# Patient Record
Sex: Female | Born: 1975 | Race: Black or African American | Hispanic: No | Marital: Single | Smoking: Current every day smoker
Health system: Southern US, Community
[De-identification: ages and names within clinical notes are randomized; demographics above are authoritative.]

## PROBLEM LIST (undated history)

## (undated) ENCOUNTER — Emergency Department (HOSPITAL_COMMUNITY): Admission: EM | Payer: MEDICAID | Source: Home / Self Care

## (undated) DIAGNOSIS — S42309A Unspecified fracture of shaft of humerus, unspecified arm, initial encounter for closed fracture: Secondary | ICD-10-CM

## (undated) DIAGNOSIS — F489 Nonpsychotic mental disorder, unspecified: Secondary | ICD-10-CM

## (undated) DIAGNOSIS — Z8709 Personal history of other diseases of the respiratory system: Secondary | ICD-10-CM

## (undated) DIAGNOSIS — E119 Type 2 diabetes mellitus without complications: Secondary | ICD-10-CM

## (undated) DIAGNOSIS — J45909 Unspecified asthma, uncomplicated: Secondary | ICD-10-CM

## (undated) DIAGNOSIS — S92402B Displaced unspecified fracture of left great toe, initial encounter for open fracture: Secondary | ICD-10-CM

## (undated) HISTORY — PX: LIPOMA EXCISION: SHX5283

---
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
## 1999-03-02 ENCOUNTER — Inpatient Hospital Stay (HOSPITAL_COMMUNITY): Admission: EM | Admit: 1999-03-02 | Discharge: 1999-03-03 | Payer: Self-pay | Admitting: Emergency Medicine

## 1999-03-02 ENCOUNTER — Encounter: Payer: Self-pay | Admitting: Infectious Diseases

## 1999-11-13 ENCOUNTER — Ambulatory Visit (HOSPITAL_COMMUNITY): Admission: RE | Admit: 1999-11-13 | Discharge: 1999-11-13 | Payer: Self-pay | Admitting: *Deleted

## 1999-12-14 ENCOUNTER — Ambulatory Visit (HOSPITAL_COMMUNITY): Admission: RE | Admit: 1999-12-14 | Discharge: 1999-12-14 | Payer: Self-pay | Admitting: *Deleted

## 1999-12-25 ENCOUNTER — Inpatient Hospital Stay (HOSPITAL_COMMUNITY): Admission: AD | Admit: 1999-12-25 | Discharge: 1999-12-25 | Payer: Self-pay | Admitting: *Deleted

## 1999-12-25 ENCOUNTER — Encounter: Payer: Self-pay | Admitting: *Deleted

## 2000-01-12 ENCOUNTER — Inpatient Hospital Stay (HOSPITAL_COMMUNITY): Admission: AD | Admit: 2000-01-12 | Discharge: 2000-01-15 | Payer: Self-pay | Admitting: *Deleted

## 2000-01-12 ENCOUNTER — Encounter: Payer: Self-pay | Admitting: *Deleted

## 2001-06-21 HISTORY — PX: TUBAL LIGATION: SHX77

## 2001-06-21 HISTORY — PX: ECTOPIC PREGNANCY SURGERY: SHX613

## 2002-04-29 ENCOUNTER — Encounter: Payer: Self-pay | Admitting: Emergency Medicine

## 2002-04-29 ENCOUNTER — Emergency Department (HOSPITAL_COMMUNITY): Admission: EM | Admit: 2002-04-29 | Discharge: 2002-04-29 | Payer: Self-pay | Admitting: Emergency Medicine

## 2002-05-10 ENCOUNTER — Emergency Department (HOSPITAL_COMMUNITY): Admission: EM | Admit: 2002-05-10 | Discharge: 2002-05-10 | Payer: Self-pay | Admitting: Emergency Medicine

## 2002-07-05 ENCOUNTER — Observation Stay (HOSPITAL_COMMUNITY): Admission: AD | Admit: 2002-07-05 | Discharge: 2002-07-06 | Payer: Self-pay | Admitting: *Deleted

## 2010-11-30 ENCOUNTER — Emergency Department (HOSPITAL_COMMUNITY)
Admission: EM | Admit: 2010-11-30 | Discharge: 2010-12-01 | Disposition: A | Discharge status: Home / Self Care | Payer: Self-pay | Attending: Emergency Medicine | Admitting: Emergency Medicine

## 2010-11-30 DIAGNOSIS — X58XXXA Exposure to other specified factors, initial encounter: Secondary | ICD-10-CM | POA: Insufficient documentation

## 2010-11-30 DIAGNOSIS — T148XXA Other injury of unspecified body region, initial encounter: Secondary | ICD-10-CM | POA: Insufficient documentation

## 2010-11-30 DIAGNOSIS — M545 Low back pain, unspecified: Secondary | ICD-10-CM | POA: Insufficient documentation

## 2010-11-30 DIAGNOSIS — M25519 Pain in unspecified shoulder: Secondary | ICD-10-CM | POA: Insufficient documentation

## 2010-11-30 DIAGNOSIS — M546 Pain in thoracic spine: Secondary | ICD-10-CM | POA: Insufficient documentation

## 2010-11-30 LAB — URINALYSIS, ROUTINE W REFLEX MICROSCOPIC
Glucose, UA: NEGATIVE mg/dL
Hgb urine dipstick: NEGATIVE
Ketones, ur: 15 mg/dL — AB
Nitrite: NEGATIVE
Protein, ur: NEGATIVE mg/dL
Specific Gravity, Urine: 1.029 (ref 1.005–1.030)
Urobilinogen, UA: 1 mg/dL (ref 0.0–1.0)
pH: 5.5 (ref 5.0–8.0)

## 2010-11-30 LAB — URINE MICROSCOPIC-ADD ON

## 2011-03-03 ENCOUNTER — Emergency Department (HOSPITAL_COMMUNITY): Payer: Self-pay

## 2011-03-03 ENCOUNTER — Emergency Department (HOSPITAL_COMMUNITY)
Admission: EM | Admit: 2011-03-03 | Discharge: 2011-03-03 | Disposition: A | Discharge status: Home / Self Care | Payer: Self-pay | Attending: Emergency Medicine | Admitting: Emergency Medicine

## 2011-03-03 DIAGNOSIS — J029 Acute pharyngitis, unspecified: Secondary | ICD-10-CM | POA: Insufficient documentation

## 2011-03-03 DIAGNOSIS — J45909 Unspecified asthma, uncomplicated: Secondary | ICD-10-CM | POA: Insufficient documentation

## 2011-03-03 DIAGNOSIS — R0602 Shortness of breath: Secondary | ICD-10-CM | POA: Insufficient documentation

## 2011-03-25 ENCOUNTER — Inpatient Hospital Stay (HOSPITAL_COMMUNITY)
Admission: AD | Admit: 2011-03-25 | Discharge: 2011-03-25 | Discharge status: Against Medical Advice | Payer: Self-pay | Source: Ambulatory Visit | Attending: Obstetrics & Gynecology | Admitting: Obstetrics & Gynecology

## 2011-03-25 ENCOUNTER — Emergency Department (HOSPITAL_COMMUNITY)
Admission: EM | Admit: 2011-03-25 | Discharge: 2011-03-25 | Disposition: A | Discharge status: Home / Self Care | Payer: Self-pay | Attending: Emergency Medicine | Admitting: Emergency Medicine

## 2011-03-25 DIAGNOSIS — L02219 Cutaneous abscess of trunk, unspecified: Secondary | ICD-10-CM | POA: Insufficient documentation

## 2011-03-25 DIAGNOSIS — R109 Unspecified abdominal pain: Secondary | ICD-10-CM | POA: Insufficient documentation

## 2011-03-25 DIAGNOSIS — L03319 Cellulitis of trunk, unspecified: Secondary | ICD-10-CM | POA: Insufficient documentation

## 2011-03-25 NOTE — Plan of Care (Signed)
Pt is not in the lobby when called to triage 

## 2011-03-27 ENCOUNTER — Inpatient Hospital Stay (INDEPENDENT_AMBULATORY_CARE_PROVIDER_SITE_OTHER)
Admission: RE | Admit: 2011-03-27 | Discharge: 2011-03-27 | Disposition: A | Discharge status: Home / Self Care | Payer: Self-pay | Source: Ambulatory Visit | Attending: Family Medicine | Admitting: Family Medicine

## 2011-03-27 DIAGNOSIS — L03319 Cellulitis of trunk, unspecified: Secondary | ICD-10-CM

## 2011-04-01 ENCOUNTER — Inpatient Hospital Stay (INDEPENDENT_AMBULATORY_CARE_PROVIDER_SITE_OTHER)
Admission: RE | Admit: 2011-04-01 | Discharge: 2011-04-01 | Disposition: A | Discharge status: Home / Self Care | Payer: Self-pay | Source: Ambulatory Visit | Attending: Emergency Medicine | Admitting: Emergency Medicine

## 2011-04-01 DIAGNOSIS — L0291 Cutaneous abscess, unspecified: Secondary | ICD-10-CM

## 2011-08-12 ENCOUNTER — Encounter (HOSPITAL_COMMUNITY): Payer: Self-pay | Admitting: Emergency Medicine

## 2011-08-12 ENCOUNTER — Emergency Department (INDEPENDENT_AMBULATORY_CARE_PROVIDER_SITE_OTHER)
Admission: EM | Admit: 2011-08-12 | Discharge: 2011-08-12 | Disposition: A | Discharge status: Home Health | Payer: Self-pay | Source: Home / Self Care | Attending: Emergency Medicine | Admitting: Emergency Medicine

## 2011-08-12 DIAGNOSIS — K047 Periapical abscess without sinus: Secondary | ICD-10-CM

## 2011-08-12 MED ORDER — PENICILLIN V POTASSIUM 500 MG PO TABS: 500.0000 mg | ORAL_TABLET | Freq: Three times a day (TID) | ORAL | Status: AC

## 2011-08-12 MED ORDER — HYDROCODONE-ACETAMINOPHEN 5-500 MG PO TABS: 1.0000 | ORAL_TABLET | Freq: Four times a day (QID) | ORAL | Status: AC | PRN

## 2011-08-12 NOTE — ED Notes (Signed)
Multiple complaints. Pain under left jaw radiating to left ear.  Onset 2-3 days ago.  Visibly swollen face.  Patient denies tooth pain.   Reports stumping right great toe and great toe nail separated from toe.

## 2011-08-12 NOTE — Discharge Instructions (Signed)
Abscessed Tooth °A tooth abscess is a collection of infected fluid (pus) from a bacterial infection in the inner part of the tooth (pulp). It usually occurs at the end of the tooth's root.  °CAUSES  °· A very bad cavity (extensive tooth decay).  °· Trauma to the tooth, such as a broken or chipped tooth, that allows bacteria to enter into the pulp.  °SYMPTOMS °· Severe pain in and around the infected tooth.  °· Swelling and redness around the abscessed tooth or in the mouth or face.  °· Tenderness.  °· Pus drainage.  °· Bad breath.  °· Bitter taste in the mouth.  °· Difficulty swallowing.  °· Difficulty opening the mouth.  °· Feeling sick to your stomach (nauseous).  °· Vomiting.  °· Chills.  °· Swollen neck glands.  °DIAGNOSIS °· A medical and dental history will be taken.  °· An examination will be performed by tapping on the abscessed tooth.  °· X-rays may be taken of the tooth to identify the abscess.  °TREATMENT °The goal of treatment is to eliminate the infection.  °· You may be prescribed antibiotic medicine to stop the infection from spreading.  °· A root canal may be performed to save the tooth. If the tooth cannot be saved, it may be pulled (extracted) and the abscess may be drained.  °HOME CARE INSTRUCTIONS °· Only take over-the-counter or prescription medicines for pain, fever, or discomfort as directed by your caregiver.  °· Do not drive after taking pain medicine (narcotics).  °· Rinse your mouth (gargle) often with salt water (¼ tsp salt in 8 oz of warm water) to relieve pain or swelling.  °· Do not apply heat to the outside of your face.  °· Return to your dentist for further treatment as directed.  °SEEK IMMEDIATE DENTAL CARE IF: °· You have a temperature by mouth above 102° F (38.9° C), not controlled by medicine.  °· You have chills or a very bad headache.  °· You have problems breathing or swallowing.  °· Your have trouble opening your mouth.  °· You develop swelling in the neck or around the eye.   °· Your pain is not helped by medicine.  °· Your pain is getting worse instead of better.  °Document Released: 06/07/2005 Document Revised: 02/17/2011 Document Reviewed: 09/15/2010 °Exit[employer]® Patient Information ©2012 [employer]. °

## 2011-08-12 NOTE — ED Provider Notes (Signed)
History     CSN: [id number]  Arrival date & time 08/12/11  1729   First MD Initiated Contact with Patient 08/12/11 1816      Chief Complaint  Patient presents with  . Abscess    (Consider location/radiation/quality/duration/timing/severity/associated sxs/prior treatment) HPI Comments: Patient presented to urgent care complaining of left jaw swelling with associated with dental pain. She has had a feeling of a molar on her left lower teeth. Has been perceiving changes to temperature and  Intermittent discomfort that come and go. This morning noticed her face to be swollen and hurting on the left lower jaw pain shoots up to her ear. No fevers, no headaches.  Patient is a 36 y.o. female presenting with abscess. The history is provided by the patient.  Abscess  This is a new problem. The current episode started less than one week ago. The onset was sudden. The problem occurs rarely. The problem has been unchanged. The problem is moderate. The patient was exposed to OTC medications. The abscess first occurred at home. Pertinent negatives include no fever, no congestion and no rhinorrhea.    Past Medical History  Diagnosis Date  . Asthma   . Bronchitis     Past Surgical History  Procedure Date  . Cesarean section   . Axillary surgery     for abscess  . Vagina surgery     abscess    History reviewed. No pertinent family history.  History  Substance Use Topics  . Smoking status: Current Everyday Smoker  . Smokeless tobacco: Not on file  . Alcohol Use: No    OB History    Grav Para Term Preterm Abortions TAB SAB Ect Mult Living                  Review of Systems  Constitutional: Negative for fever, chills and fatigue.  HENT: Positive for ear pain. Negative for congestion, rhinorrhea, drooling, mouth sores, neck pain, neck stiffness and postnasal drip.     Allergies  Review of patient's allergies indicates no known allergies.  Home Medications   Current Outpatient  Rx  Name Route Sig Dispense Refill  . ALBUTEROL IN Inhalation Inhale into the lungs.    Marland Kitchen HYDROCODONE-ACETAMINOPHEN 5-500 MG PO TABS Oral Take 1 tablet by mouth every 6 (six) hours as needed for pain. 15 tablet 0  . PENICILLIN V POTASSIUM 500 MG PO TABS Oral Take 1 tablet (500 mg total) by mouth 3 (three) times daily. 30 tablet 0    BP 126/80  Pulse 80  Temp(Src) 98.9 F (37.2 C) (Oral)  Resp 16  SpO2 97%  LMP 08/01/2011  Physical Exam  Nursing note and vitals reviewed. Constitutional: She appears well-developed and well-nourished.  HENT:  Head: Normocephalic.  Mouth/Throat:    Neck: Normal range of motion. No JVD present.  Pulmonary/Chest: Effort normal.  Abdominal: Soft.  Skin: Skin is warm.    ED Course  Procedures (including critical care time)  Labs Reviewed - No data to display No results found.   1. Dental abscess       MDM  Patient with a molar lower left-sided early abscess formation. Afebrile in sudden onset of facial swelling for the last 12 hours.        Jimmie Molly, MD 08/12/11 2027

## 2011-10-05 ENCOUNTER — Encounter (HOSPITAL_COMMUNITY): Payer: Self-pay | Admitting: *Deleted

## 2011-10-05 ENCOUNTER — Emergency Department (HOSPITAL_COMMUNITY)
Admission: EM | Admit: 2011-10-05 | Discharge: 2011-10-05 | Disposition: A | Discharge status: Home / Self Care | Payer: Self-pay | Attending: Emergency Medicine | Admitting: Emergency Medicine

## 2011-10-05 ENCOUNTER — Emergency Department (HOSPITAL_COMMUNITY): Payer: Self-pay

## 2011-10-05 DIAGNOSIS — J45909 Unspecified asthma, uncomplicated: Secondary | ICD-10-CM | POA: Insufficient documentation

## 2011-10-05 DIAGNOSIS — L0390 Cellulitis, unspecified: Secondary | ICD-10-CM

## 2011-10-05 DIAGNOSIS — R1031 Right lower quadrant pain: Secondary | ICD-10-CM | POA: Insufficient documentation

## 2011-10-05 DIAGNOSIS — J189 Pneumonia, unspecified organism: Secondary | ICD-10-CM | POA: Insufficient documentation

## 2011-10-05 DIAGNOSIS — L02219 Cutaneous abscess of trunk, unspecified: Secondary | ICD-10-CM | POA: Insufficient documentation

## 2011-10-05 DIAGNOSIS — R059 Cough, unspecified: Secondary | ICD-10-CM | POA: Insufficient documentation

## 2011-10-05 DIAGNOSIS — R05 Cough: Secondary | ICD-10-CM | POA: Insufficient documentation

## 2011-10-05 DIAGNOSIS — L03319 Cellulitis of trunk, unspecified: Secondary | ICD-10-CM | POA: Insufficient documentation

## 2011-10-05 LAB — CBC
HCT: 35.8 % — ABNORMAL LOW (ref 36.0–46.0)
Hemoglobin: 11.5 g/dL — ABNORMAL LOW (ref 12.0–15.0)
MCH: 24.7 pg — ABNORMAL LOW (ref 26.0–34.0)
MCHC: 32.1 g/dL (ref 30.0–36.0)
MCV: 76.8 fL — ABNORMAL LOW (ref 78.0–100.0)
Platelets: 210 10*3/uL (ref 150–400)
RBC: 4.66 MIL/uL (ref 3.87–5.11)
RDW: 15.7 % — ABNORMAL HIGH (ref 11.5–15.5)
WBC: 9.8 10*3/uL (ref 4.0–10.5)

## 2011-10-05 LAB — DIFFERENTIAL
Basophils Absolute: 0 10*3/uL (ref 0.0–0.1)
Basophils Relative: 0 % (ref 0–1)
Eosinophils Absolute: 0.1 10*3/uL (ref 0.0–0.7)
Eosinophils Relative: 1 % (ref 0–5)
Lymphocytes Relative: 16 % (ref 12–46)
Lymphs Abs: 1.6 10*3/uL (ref 0.7–4.0)
Monocytes Absolute: 0.7 10*3/uL (ref 0.1–1.0)
Monocytes Relative: 7 % (ref 3–12)
Neutro Abs: 7.4 10*3/uL (ref 1.7–7.7)
Neutrophils Relative %: 76 % (ref 43–77)

## 2011-10-05 LAB — URINALYSIS, ROUTINE W REFLEX MICROSCOPIC
Bilirubin Urine: NEGATIVE
Glucose, UA: NEGATIVE mg/dL
Leukocytes, UA: NEGATIVE
Nitrite: NEGATIVE
Protein, ur: 30 mg/dL — AB
Specific Gravity, Urine: 1.028 (ref 1.005–1.030)
Urobilinogen, UA: 1 mg/dL (ref 0.0–1.0)
pH: 6 (ref 5.0–8.0)

## 2011-10-05 LAB — BASIC METABOLIC PANEL
BUN: 8 mg/dL (ref 6–23)
CO2: 23 mEq/L (ref 19–32)
Calcium: 9.6 mg/dL (ref 8.4–10.5)
Chloride: 104 mEq/L (ref 96–112)
Creatinine, Ser: 0.62 mg/dL (ref 0.50–1.10)
GFR calc Af Amer: >90 mL/min (ref >90)
GFR calc non Af Amer: >90 mL/min (ref >90)
Glucose, Bld: 91 mg/dL (ref 70–99)
Potassium: 3.7 mEq/L (ref 3.5–5.1)
Sodium: 138 mEq/L (ref 135–145)

## 2011-10-05 LAB — URINE MICROSCOPIC-ADD ON

## 2011-10-05 LAB — PREGNANCY, URINE: Preg Test, Ur: NEGATIVE

## 2011-10-05 MED ORDER — IBUPROFEN 800 MG PO TABS
800.0000 mg | ORAL_TABLET | Freq: Once | ORAL | Status: AC
  Administered 2011-10-05: 800 mg via ORAL

## 2011-10-05 MED ORDER — DOXYCYCLINE HYCLATE 100 MG PO CAPS: 100.0000 mg | ORAL_CAPSULE | Freq: Two times a day (BID) | ORAL | Status: AC

## 2011-10-05 MED ORDER — ALBUTEROL SULFATE HFA 108 (90 BASE) MCG/ACT IN AERS
INHALATION_SPRAY | RESPIRATORY_TRACT | Status: AC
  Filled 2011-10-05: qty 6.7

## 2011-10-05 MED ORDER — HYDROCODONE-ACETAMINOPHEN 5-325 MG PO TABS: 1.0000 | ORAL_TABLET | Freq: Four times a day (QID) | ORAL | Status: AC | PRN

## 2011-10-05 MED ORDER — GUAIFENESIN ER 1200 MG PO TB12: 1.0000 | ORAL_TABLET | Freq: Two times a day (BID) | ORAL | Status: DC

## 2011-10-05 MED ORDER — ALBUTEROL SULFATE HFA 108 (90 BASE) MCG/ACT IN AERS
2.0000 | INHALATION_SPRAY | RESPIRATORY_TRACT | Status: DC | PRN
  Administered 2011-10-05: 2 via RESPIRATORY_TRACT

## 2011-10-05 MED ORDER — IBUPROFEN 800 MG PO TABS
ORAL_TABLET | ORAL | Status: AC
  Filled 2011-10-05: qty 1

## 2011-10-05 NOTE — ED Notes (Signed)
Pt. Discharged to home via w/c, pt. Alert and oriented, NAD noted 

## 2011-10-05 NOTE — Discharge Instructions (Signed)
Use warm compresses on the area on her right lower abdomen.  Return here see if any worsening in her condition.  Followup here in 2 days for recheck of the area on her lower abdomen.

## 2011-10-05 NOTE — ED Notes (Signed)
Pt states she has been treated for an abscess before. Pt states they cont to come back under her abdominal foils. Pt also c/o back pain. Pt state he cycle has came back on with in 15 days

## 2011-10-05 NOTE — Progress Notes (Signed)
Pt listed as self pay with no insurance coverage Pt confirms she is self pay [place] resident.  CM and [facility] community liaison spoke with her Pt offered [facility] services to assist with finding a [place] self pay provider Pt states she is presently living with a friend and will not be able to offer information for [facility] application ED CM iscussed the importance of a pcp for f/u. Reviewed Health connect number to assist with finding self pay provider close to her residence. Reviewed resources for medications, housing, DSS, health Department and other resources in [place] Pt voiced understanding and appreciation of resources provided

## 2011-10-05 NOTE — ED Notes (Signed)
Pt states she is unable to afford meds.  Talked with MD.  Will consult with case management for prescription refill

## 2011-10-05 NOTE — ED Provider Notes (Signed)
History     CSN: [id number]  Arrival date & time 10/05/11  1301   First MD Initiated Contact with Patient 10/05/11 1348      Chief Complaint  Patient presents with  . Abscess  . Back Pain  . Nasal Congestion    (Consider location/radiation/quality/duration/timing/severity/associated sxs/prior treatment) HPI Patient presents to the emergency department with cough and upper respiratory symptoms, along with what she feels it may be a forming abscess under her abdominal fat fold.  Patient states that she has not had any fevers, chest pain, shortness of breath, weakness, nausea/vomiting, abdominal pain, or headache.  Patient states she has had a previous abscess in the same area, where she is complaining of discomfort. Past Medical History  Diagnosis Date  . Asthma   . Bronchitis     Past Surgical History  Procedure Date  . Cesarean section   . Axillary surgery     for abscess  . Vagina surgery     abscess    No family history on file.  History  Substance Use Topics  . Smoking status: Current Everyday Smoker  . Smokeless tobacco: Not on file  . Alcohol Use: No    OB History    Grav Para Term Preterm Abortions TAB SAB Ect Mult Living                  Review of Systems All pertinent positives and negatives reviewed in the history of present illness  Allergies  Review of patient's allergies indicates no known allergies.  Home Medications   Current Outpatient Rx  Name Route Sig Dispense Refill  . ALBUTEROL SULFATE HFA 108 (90 BASE) MCG/ACT IN AERS Inhalation Inhale 2 puffs into the lungs every 6 (six) hours as needed. Wheezing      BP 122/75  Pulse 125  Temp(Src) 98.8 F (37.1 C) (Oral)  Resp 18  SpO2 95%  LMP 10/05/2011  Physical Exam  Constitutional: She is oriented to person, place, and time. She appears well-developed and well-nourished. No distress.  HENT:  Head: Normocephalic and atraumatic.  Cardiovascular: Regular rhythm and normal heart  sounds.  Exam reveals no gallop and no friction rub.   No murmur heard. Pulmonary/Chest: Effort normal and breath sounds normal.  Abdominal: Normal appearance and bowel sounds are normal. There is no tenderness.    Neurological: She is alert and oriented to person, place, and time.  Skin: Skin is warm and dry. No rash noted.    ED Course  Procedures (including critical care time) Patient has a questionable pneumonia on her chest x-ray.  The patient started on antibiotics.  I will have her recheck with her primary doctor for the small red area underneath her right side of her pannus.  Told to use warm compresses on the area as well.   MDM  MDM Reviewed: nursing note and vitals Interpretation: labs and x-ray            Carlyle Dolly, PA-C 10/05/11 1723

## 2011-10-05 NOTE — Progress Notes (Signed)
Pt self pay ED CM contacted by [facility] ED RN after pt reports she is unable to afford medication cost at time of d/c Sharyl Nimrod at [facility] pharmacy confirmed pt is eligible for [facility] indigent services CM reviewed EPIC to determine eligibility also.  Rx processed for doxycycline and guaifenesin and tubed to [facility] pharmacy with request to contact ED RN when ready for pt

## 2011-10-06 NOTE — ED Provider Notes (Signed)
Medical screening examination/treatment/procedure(s) were performed by non-physician practitioner and as supervising physician I was immediately available for consultation/collaboration.  Raeford Razor, MD 10/06/11 [phone]

## 2011-12-17 ENCOUNTER — Encounter (HOSPITAL_COMMUNITY): Payer: Self-pay | Admitting: Emergency Medicine

## 2011-12-17 ENCOUNTER — Emergency Department (HOSPITAL_COMMUNITY)
Admission: EM | Admit: 2011-12-17 | Discharge: 2011-12-18 | Disposition: A | Discharge status: Home / Self Care | Payer: Self-pay | Attending: Emergency Medicine | Admitting: Emergency Medicine

## 2011-12-17 DIAGNOSIS — J45909 Unspecified asthma, uncomplicated: Secondary | ICD-10-CM | POA: Insufficient documentation

## 2011-12-17 DIAGNOSIS — L03319 Cellulitis of trunk, unspecified: Secondary | ICD-10-CM | POA: Insufficient documentation

## 2011-12-17 DIAGNOSIS — L0291 Cutaneous abscess, unspecified: Secondary | ICD-10-CM

## 2011-12-17 DIAGNOSIS — F17200 Nicotine dependence, unspecified, uncomplicated: Secondary | ICD-10-CM | POA: Insufficient documentation

## 2011-12-17 DIAGNOSIS — L02219 Cutaneous abscess of trunk, unspecified: Secondary | ICD-10-CM | POA: Insufficient documentation

## 2011-12-17 MED ORDER — OXYCODONE-ACETAMINOPHEN 5-325 MG PO TABS
2.0000 | ORAL_TABLET | Freq: Once | ORAL | Status: AC
  Administered 2011-12-18: 2 via ORAL
  Filled 2011-12-17: qty 2

## 2011-12-17 NOTE — ED Notes (Signed)
Pt has half dollar size reddened raised area to right lower abdomen. Is under large skin fold. Pt c/o pain to area. No drainage noted.

## 2011-12-17 NOTE — ED Notes (Signed)
PT. REPORTS ABSCESS AT LOWER ABDOMEN ONSET YESTERDAY , UNSURE IF IT IF DRAINING , STATES HISTORY OF ABSCESSES AT THE SAME AREA THAT WAS INCISED AND DRAINED.

## 2011-12-18 MED ORDER — SULFAMETHOXAZOLE-TMP DS 800-160 MG PO TABS
1.0000 | ORAL_TABLET | Freq: Once | ORAL | Status: AC
  Administered 2011-12-18: 1 via ORAL
  Filled 2011-12-18: qty 1

## 2011-12-18 MED ORDER — OXYCODONE-ACETAMINOPHEN 5-325 MG PO TABS: 1.0000 | ORAL_TABLET | Freq: Four times a day (QID) | ORAL | Status: AC | PRN

## 2011-12-18 MED ORDER — SULFAMETHOXAZOLE-TMP DS 800-160 MG PO TABS: 1.0000 | ORAL_TABLET | Freq: Two times a day (BID) | ORAL | Status: AC

## 2011-12-18 NOTE — Discharge Instructions (Signed)
Abscess Care After An abscess (also called a boil or furuncle) is an infected area that contains a collection of pus. Signs and symptoms of an abscess include pain, tenderness, redness, or hardness, or you may feel a moveable soft area under your skin. An abscess can occur anywhere in the body. The infection may spread to surrounding tissues causing cellulitis. A cut (incision) by the surgeon was made over your abscess and the pus was drained out. Gauze may have been packed into the space to provide a drain that will allow the cavity to heal from the inside outwards. The boil may be painful for 5 to 7 days. Most people with a boil do not have high fevers. Your abscess, if seen early, may not have localized, and may not have been lanced. If not, another appointment may be required for this if it does not get better on its own or with medications. HOME CARE INSTRUCTIONS   Only take over-the-counter or prescription medicines for pain, discomfort, or fever as directed by your caregiver.   When you bathe, soak and then remove gauze or iodoform packs at least daily or as directed by your caregiver. You may then wash the wound gently with mild soapy water. Repack with gauze or do as your caregiver directs.  SEEK IMMEDIATE MEDICAL CARE IF:   You develop increased pain, swelling, redness, drainage, or bleeding in the wound site.   You develop signs of generalized infection including muscle aches, chills, fever, or a general ill feeling.   An oral temperature above 102 F (38.9 C) develops, not controlled by medication.  See your caregiver for a recheck if you develop any of the symptoms described above. If medications (antibiotics) were prescribed, take them as directed. Document Released: 12/24/2004 Document Revised: 05/27/2011 Document Reviewed: 08/21/2007 [facility] Patient Information 2012 [place], [place]. Return to the emergency department, or [facility] in 2 days for recheck of the  wound Take the antibiotic as directed until, completed

## 2011-12-18 NOTE — ED Provider Notes (Signed)
Medical screening examination/treatment/procedure(s) were performed by non-physician practitioner and as supervising physician I was immediately available for consultation/collaboration.   Kylie Simmonds, MD 12/18/11 0708 

## 2011-12-18 NOTE — ED Provider Notes (Signed)
History     CSN: [id number]  Arrival date & time 12/17/11  2137   First MD Initiated Contact with Patient 12/17/11 2310      Chief Complaint  Patient presents with  . Abscess    (Consider location/radiation/quality/duration/timing/severity/associated sxs/prior treatment) HPI Comments: Morbidly obese, female, with a history of abscesses in the pannus fold.  Now has a red, tender area for the past 2, days without drainage  Patient is a 36 y.o. female presenting with abscess. The history is provided by the patient.  Abscess  This is a new problem. The problem occurs continuously. The problem has been gradually worsening. The problem is moderate. The abscess is characterized by redness and painfulness. The patient was exposed to OTC medications. Pertinent negatives include no fever.    Past Medical History  Diagnosis Date  . Asthma   . Bronchitis     Past Surgical History  Procedure Date  . Cesarean section   . Axillary surgery     for abscess  . Vagina surgery     abscess    No family history on file.  History  Substance Use Topics  . Smoking status: Current Everyday Smoker  . Smokeless tobacco: Not on file  . Alcohol Use: No    OB History    Grav Para Term Preterm Abortions TAB SAB Ect Mult Living                  Review of Systems  Constitutional: Negative for fever.  Gastrointestinal: Negative for abdominal pain and abdominal distention.  Skin: Negative for wound.  Neurological: Negative for dizziness and weakness.    Allergies  Review of patient's allergies indicates no known allergies.  Home Medications   Current Outpatient Rx  Name Route Sig Dispense Refill  . ALBUTEROL SULFATE HFA 108 (90 BASE) MCG/ACT IN AERS Inhalation Inhale 2 puffs into the lungs every 6 (six) hours as needed. Wheezing    . OXYCODONE-ACETAMINOPHEN 5-325 MG PO TABS Oral Take 1 tablet by mouth every 6 (six) hours as needed for pain. 30 tablet 0  . SULFAMETHOXAZOLE-TMP DS  800-160 MG PO TABS Oral Take 1 tablet by mouth 2 (two) times daily. 14 tablet 0    BP 100/60  Pulse 83  Temp 99.5 F (37.5 C) (Oral)  Resp 18  SpO2 96%  LMP 10/29/2011  Physical Exam  Constitutional: She appears well-developed and well-nourished.       Morbidly obese  HENT:  Head: Normocephalic.  Eyes: Pupils are equal, round, and reactive to light.  Neck: Normal range of motion.  Cardiovascular: Normal rate.   Abdominal: Soft. She exhibits no distension. There is tenderness.       Centimeter by 2 cm area in the right pannus fold.  It is red, tender, fluctuant  Genitourinary: Vagina normal.  Musculoskeletal: Normal range of motion.  Neurological: She is alert.  Skin: There is erythema.    ED Course  INCISION AND DRAINAGE Date/Time: 12/18/2011 3:12 AM Performed by: Arman Filter Authorized by: Arman Filter Consent: Verbal consent not obtained. Consent given by: patient Patient understanding: patient states understanding of the procedure being performed Patient identity confirmed: verbally with patient Time out: Immediately prior to procedure a "time out" was called to verify the correct patient, procedure, equipment, support staff and site/side marked as required. Type: abscess Body area: trunk Location details: abdomen Anesthesia: local infiltration Local anesthetic: lidocaine 1% without epinephrine Patient sedated: no Scalpel size: 11 Needle gauge: 22 Incision  type: single straight Complexity: simple Drainage: purulent Drainage amount: copious Wound treatment: drain placed Packing material: 1/4 in iodoform gauze Patient tolerance: Patient tolerated the procedure well with no immediate complications.   (including critical care time)  Labs Reviewed - No data to display No results found.   1. Abscess       MDM   Large abdominal wall abscess        Arman Filter, NP 12/18/11 0316  Arman Filter, NP 12/18/11 0316  Arman Filter,  NP 12/18/11 [phone]

## 2012-12-04 ENCOUNTER — Encounter (HOSPITAL_COMMUNITY): Payer: Self-pay | Admitting: Emergency Medicine

## 2012-12-04 ENCOUNTER — Emergency Department (HOSPITAL_COMMUNITY)
Admission: EM | Admit: 2012-12-04 | Discharge: 2012-12-04 | Discharge status: Against Medical Advice | Payer: No Typology Code available for payment source | Attending: Emergency Medicine | Admitting: Emergency Medicine

## 2012-12-04 DIAGNOSIS — Z79899 Other long term (current) drug therapy: Secondary | ICD-10-CM | POA: Insufficient documentation

## 2012-12-04 DIAGNOSIS — O9989 Other specified diseases and conditions complicating pregnancy, childbirth and the puerperium: Secondary | ICD-10-CM | POA: Insufficient documentation

## 2012-12-04 DIAGNOSIS — Z87798 Personal history of other (corrected) congenital malformations: Secondary | ICD-10-CM | POA: Insufficient documentation

## 2012-12-04 DIAGNOSIS — Z8709 Personal history of other diseases of the respiratory system: Secondary | ICD-10-CM | POA: Insufficient documentation

## 2012-12-04 DIAGNOSIS — Y9389 Activity, other specified: Secondary | ICD-10-CM | POA: Insufficient documentation

## 2012-12-04 DIAGNOSIS — J45909 Unspecified asthma, uncomplicated: Secondary | ICD-10-CM | POA: Insufficient documentation

## 2012-12-04 DIAGNOSIS — Y92410 Unspecified street and highway as the place of occurrence of the external cause: Secondary | ICD-10-CM | POA: Insufficient documentation

## 2012-12-04 DIAGNOSIS — IMO0002 Reserved for concepts with insufficient information to code with codable children: Secondary | ICD-10-CM | POA: Insufficient documentation
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
## 2012-12-04 NOTE — ED Provider Notes (Signed)
Medical screening examination/treatment/procedure(s) were performed by non-physician practitioner and as supervising physician I was immediately available for consultation/collaboration.  Ethelda Chick, MD 12/04/12 2041
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
## 2012-12-04 NOTE — ED Notes (Addendum)
Took OB monitor to room and bedside ultrasound. Patient asked to leave AMA, explained risk of leaving. Patient agreed to stay. Tiffany, RN came to assess patient. No fetal activity/heartrate noted on bedside monitor or no identified fetus on bedside ultrasound. EDPA notified and obtained order to check HCG level.

## 2012-12-04 NOTE — ED Notes (Signed)
OB rapid response called for patient. On the way.

## 2012-12-04 NOTE — ED Notes (Addendum)
Patient came to desk requesting to leave AMA. Educated the patient on the risk of leaving AMA to herself and to the fetus. Explained the need to obtain further testing and treatment. Patient continued to request to leave AMA. EDPA notified and rapid OB nurse called to cancel request.

## 2012-12-23 ENCOUNTER — Encounter (HOSPITAL_COMMUNITY): Payer: Self-pay | Admitting: *Deleted

## 2012-12-23 ENCOUNTER — Emergency Department (HOSPITAL_COMMUNITY)
Admission: EM | Admit: 2012-12-23 | Discharge: 2012-12-23 | Discharge status: Against Medical Advice | Payer: Self-pay | Attending: Emergency Medicine | Admitting: Emergency Medicine

## 2012-12-23 DIAGNOSIS — T7491XA Unspecified adult maltreatment, confirmed, initial encounter: Secondary | ICD-10-CM | POA: Insufficient documentation

## 2012-12-23 DIAGNOSIS — S0993XA Unspecified injury of face, initial encounter: Secondary | ICD-10-CM | POA: Insufficient documentation

## 2012-12-23 DIAGNOSIS — J45909 Unspecified asthma, uncomplicated: Secondary | ICD-10-CM | POA: Insufficient documentation

## 2012-12-23 DIAGNOSIS — T7492XA Unspecified child maltreatment, confirmed, initial encounter: Secondary | ICD-10-CM | POA: Insufficient documentation

## 2012-12-23 DIAGNOSIS — S01512A Laceration without foreign body of oral cavity, initial encounter: Secondary | ICD-10-CM

## 2012-12-23 DIAGNOSIS — S199XXA Unspecified injury of neck, initial encounter: Secondary | ICD-10-CM | POA: Insufficient documentation

## 2012-12-23 DIAGNOSIS — Z79899 Other long term (current) drug therapy: Secondary | ICD-10-CM | POA: Insufficient documentation

## 2012-12-23 DIAGNOSIS — K029 Dental caries, unspecified: Secondary | ICD-10-CM | POA: Insufficient documentation

## 2012-12-23 DIAGNOSIS — R6884 Jaw pain: Secondary | ICD-10-CM

## 2012-12-23 DIAGNOSIS — O9989 Other specified diseases and conditions complicating pregnancy, childbirth and the puerperium: Secondary | ICD-10-CM | POA: Insufficient documentation

## 2012-12-23 DIAGNOSIS — S01502A Unspecified open wound of oral cavity, initial encounter: Secondary | ICD-10-CM | POA: Insufficient documentation

## 2012-12-23 DIAGNOSIS — S0190XA Unspecified open wound of unspecified part of head, initial encounter: Secondary | ICD-10-CM | POA: Insufficient documentation

## 2012-12-23 DIAGNOSIS — F17200 Nicotine dependence, unspecified, uncomplicated: Secondary | ICD-10-CM | POA: Insufficient documentation

## 2012-12-23 DIAGNOSIS — S0003XA Contusion of scalp, initial encounter: Secondary | ICD-10-CM | POA: Insufficient documentation

## 2012-12-23 DIAGNOSIS — S025XXA Fracture of tooth (traumatic), initial encounter for closed fracture: Secondary | ICD-10-CM | POA: Insufficient documentation

## 2012-12-23 DIAGNOSIS — IMO0002 Reserved for concepts with insufficient information to code with codable children: Secondary | ICD-10-CM | POA: Insufficient documentation

## 2012-12-23 NOTE — ED Provider Notes (Signed)
Medical screening examination/treatment/procedure(s) were performed by non-physician practitioner and as supervising physician I was immediately available for consultation/collaboration.   Gavin Pound. Oletta Lamas, MD 12/23/12 2221

## 2012-12-23 NOTE — ED Notes (Signed)
PA and this RN attempted to get patient to stay however she refused and left.
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
## 2013-01-11 ENCOUNTER — Emergency Department (HOSPITAL_COMMUNITY)
Admission: EM | Admit: 2013-01-11 | Discharge: 2013-01-11 | Disposition: A | Discharge status: Home / Self Care | Payer: No Typology Code available for payment source | Attending: Emergency Medicine | Admitting: Emergency Medicine

## 2013-01-11 ENCOUNTER — Encounter (HOSPITAL_COMMUNITY): Payer: Self-pay | Admitting: *Deleted

## 2013-01-11 DIAGNOSIS — Y9389 Activity, other specified: Secondary | ICD-10-CM | POA: Insufficient documentation

## 2013-01-11 DIAGNOSIS — M545 Low back pain, unspecified: Secondary | ICD-10-CM

## 2013-01-11 DIAGNOSIS — Y92410 Unspecified street and highway as the place of occurrence of the external cause: Secondary | ICD-10-CM | POA: Insufficient documentation

## 2013-01-11 DIAGNOSIS — Z3202 Encounter for pregnancy test, result negative: Secondary | ICD-10-CM | POA: Insufficient documentation

## 2013-01-11 DIAGNOSIS — R350 Frequency of micturition: Secondary | ICD-10-CM | POA: Insufficient documentation

## 2013-01-11 DIAGNOSIS — IMO0002 Reserved for concepts with insufficient information to code with codable children: Secondary | ICD-10-CM | POA: Insufficient documentation

## 2013-01-11 DIAGNOSIS — Z79899 Other long term (current) drug therapy: Secondary | ICD-10-CM | POA: Insufficient documentation

## 2013-01-11 DIAGNOSIS — E669 Obesity, unspecified: Secondary | ICD-10-CM | POA: Insufficient documentation

## 2013-01-11 DIAGNOSIS — J45909 Unspecified asthma, uncomplicated: Secondary | ICD-10-CM | POA: Insufficient documentation

## 2013-01-11 DIAGNOSIS — F17200 Nicotine dependence, unspecified, uncomplicated: Secondary | ICD-10-CM | POA: Insufficient documentation

## 2013-01-11 LAB — URINALYSIS, ROUTINE W REFLEX MICROSCOPIC
Glucose, UA: NEGATIVE mg/dL
Ketones, ur: NEGATIVE mg/dL
Nitrite: NEGATIVE
Protein, ur: NEGATIVE mg/dL
pH: 6 (ref 5.0–8.0)

## 2013-01-11 LAB — URINE MICROSCOPIC-ADD ON

## 2013-01-11 MED ORDER — CYCLOBENZAPRINE HCL 10 MG PO TABS: 10.0000 mg | ORAL_TABLET | Freq: Three times a day (TID) | ORAL | Status: DC | PRN
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
## 2013-01-13 NOTE — ED Provider Notes (Signed)
Medical screening examination/treatment/procedure(s) were performed by non-physician practitioner and as supervising physician I was immediately available for consultation/collaboration.   Brighten Orndoff B. Joesiah Lonon, MD 01/13/13 0708 

## 2014-04-28 ENCOUNTER — Encounter (HOSPITAL_COMMUNITY): Payer: Self-pay | Admitting: Emergency Medicine

## 2014-04-28 ENCOUNTER — Emergency Department (HOSPITAL_COMMUNITY)
Admission: EM | Admit: 2014-04-28 | Discharge: 2014-04-29 | Disposition: A | Discharge status: Home / Self Care | Payer: No Typology Code available for payment source | Attending: Emergency Medicine | Admitting: Emergency Medicine

## 2014-04-28 DIAGNOSIS — Z720 Tobacco use: Secondary | ICD-10-CM | POA: Insufficient documentation

## 2014-04-28 DIAGNOSIS — Y9389 Activity, other specified: Secondary | ICD-10-CM | POA: Insufficient documentation

## 2014-04-28 DIAGNOSIS — S39012A Strain of muscle, fascia and tendon of lower back, initial encounter: Secondary | ICD-10-CM | POA: Insufficient documentation

## 2014-04-28 DIAGNOSIS — Z79899 Other long term (current) drug therapy: Secondary | ICD-10-CM | POA: Insufficient documentation

## 2014-04-28 DIAGNOSIS — J45909 Unspecified asthma, uncomplicated: Secondary | ICD-10-CM | POA: Insufficient documentation

## 2014-04-28 DIAGNOSIS — X58XXXA Exposure to other specified factors, initial encounter: Secondary | ICD-10-CM | POA: Insufficient documentation

## 2014-04-28 DIAGNOSIS — Y998 Other external cause status: Secondary | ICD-10-CM | POA: Insufficient documentation

## 2014-04-28 DIAGNOSIS — Y9289 Other specified places as the place of occurrence of the external cause: Secondary | ICD-10-CM | POA: Insufficient documentation

## 2014-04-28 MED ORDER — CYCLOBENZAPRINE HCL 10 MG PO TABS
10.0000 mg | ORAL_TABLET | Freq: Once | ORAL | Status: AC
  Administered 2014-04-28: 10 mg via ORAL
  Filled 2014-04-28: qty 1

## 2014-04-28 MED ORDER — IBUPROFEN 200 MG PO TABS
600.0000 mg | ORAL_TABLET | Freq: Once | ORAL | Status: AC
  Administered 2014-04-28: 600 mg via ORAL
  Filled 2014-04-28: qty 3

## 2014-04-28 NOTE — Discharge Instructions (Signed)

## 2014-04-28 NOTE — ED Notes (Signed)
Pt. reports left lower back pain onset 2 days ago , denies hematuria or dysuria , no injury or fall.

## 2014-04-28 NOTE — ED Provider Notes (Signed)
CSN: [id number]     Arrival date & time 04/28/14  2315 History   First MD Initiated Contact with Patient 04/28/14 2322     Chief Complaint  Patient presents with  . Back Pain   Julie [name] is a 38 y.o. female presenting with lower back pain.   4-5 days of left lower back pain without radiation. No trauma or increase in activity. It is "achy" and comes and goes overall worsening. Worst with walking, better with sitting down and resting. Has not tried stretching, heat, or any medications. Denies bowel/bladder changes, sensory or motor problems, fever.    (Consider location/radiation/quality/duration/timing/severity/associated sxs/prior Treatment) HPI  Past Medical History  Diagnosis Date  . Asthma   . Bronchitis    Past Surgical History  Procedure Laterality Date  . Cesarean section    . Axillary surgery      for abscess  . Vagina surgery      abscess   No family history on file. History  Substance Use Topics  . Smoking status: Current Every Day Smoker  . Smokeless tobacco: Never Used  . Alcohol Use: Yes   OB History    No data available     Review of Systems    Allergies  Review of patient's allergies indicates no known allergies.  Home Medications   Prior to Admission medications   Medication Sig Start Date End Date Taking? Authorizing Provider  albuterol (PROVENTIL HFA;VENTOLIN HFA) 108 (90 BASE) MCG/ACT inhaler Inhale 2 puffs into the lungs every 6 (six) hours as needed for wheezing or shortness of breath.     Historical Provider, MD  ARIPiprazole (ABILIFY) 5 MG tablet Take 5 mg by mouth 3 (three) times daily.    Historical Provider, MD  cyclobenzaprine (FLEXERIL) 10 MG tablet Take 1 tablet (10 mg total) by mouth 3 (three) times daily as needed for muscle spasms. 01/11/13   Clayton Bibles, PA-C   BP 109/76 mmHg  Pulse 98  Resp 23  SpO2 100%  LMP 04/04/2014 Physical Exam  Constitutional: She is oriented to person, place, and time. She appears  well-developed and well-nourished. No distress.  HENT:  Head: Normocephalic and atraumatic.  Neck: Normal range of motion.  Pulmonary/Chest: She exhibits no tenderness.  Abdominal: Soft. Bowel sounds are normal. She exhibits no distension. There is no tenderness. There is no guarding.  Musculoskeletal: Normal range of motion. She exhibits tenderness. She exhibits no edema.  left lumbar paraspinal spasm with tenderness to palpation. No midline tenderness. Straight leg raise negative. Gait normal.   Neurological: She is alert and oriented to person, place, and time. She has normal reflexes. She exhibits normal muscle tone. Coordination normal.    ED Course  Procedures (including critical care time) Labs Review Labs Reviewed - No data to display  Imaging Review No results found.   EKG Interpretation None      MDM   Final diagnoses:  Strain of lumbar paraspinal muscle, initial encounter   Left lumbar muscle strain with spasm: No red flags.  - Recommend heat to the area with activity as tolerated.  - Rx flexeril 10mg  TID prn spasm #15.  - Rx ibuprofen 600mg  QID #20    Julie Pour, MD 04/28/14 2346  Julie Lanes, MD 05/08/14 1254
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
## 2014-05-10 ENCOUNTER — Encounter (HOSPITAL_COMMUNITY): Payer: Self-pay | Admitting: Emergency Medicine

## 2014-05-10 ENCOUNTER — Emergency Department (HOSPITAL_COMMUNITY): Payer: No Typology Code available for payment source

## 2014-05-10 ENCOUNTER — Emergency Department (HOSPITAL_COMMUNITY): Payer: Self-pay

## 2014-05-10 ENCOUNTER — Emergency Department (HOSPITAL_COMMUNITY)
Admission: EM | Admit: 2014-05-10 | Discharge: 2014-05-10 | Disposition: A | Discharge status: Home / Self Care | Payer: Self-pay | Attending: Emergency Medicine | Admitting: Emergency Medicine

## 2014-05-10 DIAGNOSIS — J45909 Unspecified asthma, uncomplicated: Secondary | ICD-10-CM | POA: Insufficient documentation

## 2014-05-10 DIAGNOSIS — R52 Pain, unspecified: Secondary | ICD-10-CM

## 2014-05-10 DIAGNOSIS — X58XXXA Exposure to other specified factors, initial encounter: Secondary | ICD-10-CM | POA: Insufficient documentation

## 2014-05-10 DIAGNOSIS — Y9289 Other specified places as the place of occurrence of the external cause: Secondary | ICD-10-CM | POA: Insufficient documentation

## 2014-05-10 DIAGNOSIS — Y9383 Activity, rough housing and horseplay: Secondary | ICD-10-CM | POA: Insufficient documentation

## 2014-05-10 DIAGNOSIS — Z720 Tobacco use: Secondary | ICD-10-CM | POA: Insufficient documentation

## 2014-05-10 DIAGNOSIS — S62607A Fracture of unspecified phalanx of left little finger, initial encounter for closed fracture: Secondary | ICD-10-CM | POA: Insufficient documentation

## 2014-05-10 DIAGNOSIS — Y998 Other external cause status: Secondary | ICD-10-CM | POA: Insufficient documentation

## 2014-05-10 DIAGNOSIS — Z79899 Other long term (current) drug therapy: Secondary | ICD-10-CM | POA: Insufficient documentation

## 2014-05-10 DIAGNOSIS — S62609A Fracture of unspecified phalanx of unspecified finger, initial encounter for closed fracture: Secondary | ICD-10-CM

## 2014-05-10 DIAGNOSIS — Q899 Congenital malformation, unspecified: Secondary | ICD-10-CM

## 2014-05-10 MED ORDER — LIDOCAINE HCL (PF) 1 % IJ SOLN
2.0000 mL | Freq: Once | INTRAMUSCULAR | Status: AC
  Administered 2014-05-10: 2 mL
  Filled 2014-05-10: qty 5

## 2014-05-10 MED ORDER — HYDROCODONE-ACETAMINOPHEN 5-325 MG PO TABS: 1.0000 | ORAL_TABLET | ORAL | Status: DC | PRN

## 2014-05-10 MED ORDER — HYDROCODONE-ACETAMINOPHEN 5-325 MG PO TABS
1.0000 | ORAL_TABLET | Freq: Once | ORAL | Status: AC
  Administered 2014-05-10: 1 via ORAL
  Filled 2014-05-10: qty 1

## 2014-05-10 NOTE — ED Notes (Signed)
Pt. reports injury to left 5th finger while horse playing this evening with deformity/pain .
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
## 2014-05-10 NOTE — ED Notes (Signed)
MD at bedside. 

## 2014-05-10 NOTE — Discharge Instructions (Signed)
Take Tylenol for mild pain or the pain medicine prescribed for bad pain. Call Dr. Biagio Borg office today to schedule the next available appointment Finger Dislocation Finger dislocation is the displacement of bones in your finger at the joints. Most commonly, finger dislocation occurs at the proximal interphalangeal joint (the joint closest to your knuckle). Very strong, fibrous tissues (ligaments) and joint capsules connect the three bones of your fingers.  CAUSES Dislocation is caused by a forceful impact. This impact moves these bones off the joint and often tears your ligaments.  SYMPTOMS Symptoms of finger dislocation include:  Deformity of your finger.  Pain, with loss of movement. DIAGNOSIS  Finger dislocation is diagnosed with a physical exam. Often, X-ray exams are done to see if you have associated injuries, such as bone fractures. TREATMENT  Finger dislocations are treated by putting your bones back into position (reduction) either by manually moving the bones back into place or through surgery. Your finger is then kept in a fixed position (immobilized) with the use of a dressing or splint for a brief period. When your ligament has to be surgically repaired, it needs to be kept in a fixed position with a dressing or splint for 1 to 2 weeks. Because joint stiffness is a long-term complication of finger dislocation, hand exercises or physical therapy to increase the range of motion and to regain strength is usually started as soon as the ligament is healed. Exercises and therapy generally last no more than 3 months. HOME CARE INSTRUCTIONS The following measures can help to reduce pain and speed up the healing process:  Rest your injured joint. Do not move until instructed otherwise by your caregiver. Avoid activities similar to the one that caused your injury.  Apply ice to your injured joint for the first day or 2 after your reduction or as directed by your caregiver. Applying ice  helps to reduce inflammation and pain.  Put ice in a plastic bag.  Place a towel between your skin and the bag.  Leave the ice on for 15-20 minutes at a time, every 2 hours while you are awake.  Elevate your hand above your heart as directed by your caregiver to reduce swelling.  Take over-the-counter or prescription medicine for pain as your caregiver instructs you. SEEK IMMEDIATE MEDICAL CARE IF:  Your dressing or splint becomes damaged.  Your pain becomes worse rather than better.  You lose feeling in your finger, or it becomes cold and white. MAKE SURE YOU:  Understand these instructions.  Will watch your condition.  Will get help right away if you are not doing well or get worse. Document Released: 06/04/2000 Document Revised: 08/30/2011 Document Reviewed: 03/28/2011 [facility] Patient Information 2015 [place], [place]. This information is not intended to replace advice given to you by your health care provider. Make sure you discuss any questions you have with your health care provider.

## 2014-07-11 ENCOUNTER — Encounter (HOSPITAL_COMMUNITY): Payer: Self-pay | Admitting: *Deleted

## 2014-07-11 ENCOUNTER — Emergency Department (HOSPITAL_COMMUNITY): Payer: Self-pay

## 2014-07-11 ENCOUNTER — Emergency Department (HOSPITAL_COMMUNITY)
Admission: EM | Admit: 2014-07-11 | Discharge: 2014-07-11 | Disposition: A | Discharge status: Home / Self Care | Payer: Self-pay | Attending: Emergency Medicine | Admitting: Emergency Medicine

## 2014-07-11 DIAGNOSIS — Y9389 Activity, other specified: Secondary | ICD-10-CM | POA: Insufficient documentation

## 2014-07-11 DIAGNOSIS — T1490XA Injury, unspecified, initial encounter: Secondary | ICD-10-CM

## 2014-07-11 DIAGNOSIS — Y92410 Unspecified street and highway as the place of occurrence of the external cause: Secondary | ICD-10-CM | POA: Insufficient documentation

## 2014-07-11 DIAGNOSIS — Y998 Other external cause status: Secondary | ICD-10-CM | POA: Insufficient documentation

## 2014-07-11 DIAGNOSIS — S42402A Unspecified fracture of lower end of left humerus, initial encounter for closed fracture: Secondary | ICD-10-CM | POA: Insufficient documentation

## 2014-07-11 DIAGNOSIS — J45909 Unspecified asthma, uncomplicated: Secondary | ICD-10-CM | POA: Insufficient documentation

## 2014-07-11 DIAGNOSIS — S42309A Unspecified fracture of shaft of humerus, unspecified arm, initial encounter for closed fracture: Secondary | ICD-10-CM

## 2014-07-11 DIAGNOSIS — Z720 Tobacco use: Secondary | ICD-10-CM | POA: Insufficient documentation

## 2014-07-11 HISTORY — DX: Unspecified fracture of shaft of humerus, unspecified arm, initial encounter for closed fracture: S42.309A

## 2014-07-11 MED ORDER — MORPHINE SULFATE 2 MG/ML IJ SOLN
2.0000 mg | Freq: Once | INTRAMUSCULAR | Status: AC
  Administered 2014-07-11: 2 mg via INTRAVENOUS
  Filled 2014-07-11: qty 1

## 2014-07-11 MED ORDER — MORPHINE SULFATE 2 MG/ML IJ SOLN
INTRAMUSCULAR | Status: AC
  Filled 2014-07-11: qty 1

## 2014-07-11 MED ORDER — OXYCODONE-ACETAMINOPHEN 5-325 MG PO TABS
2.0000 | ORAL_TABLET | Freq: Once | ORAL | Status: AC
  Administered 2014-07-11: 2 via ORAL
  Filled 2014-07-11: qty 2

## 2014-07-11 MED ORDER — MORPHINE SULFATE 2 MG/ML IJ SOLN
2.0000 mg | Freq: Once | INTRAMUSCULAR | Status: AC
  Administered 2014-07-11: 2 mg via INTRAVENOUS

## 2014-07-11 MED ORDER — OXYCODONE-ACETAMINOPHEN 5-325 MG PO TABS: 1.0000 | ORAL_TABLET | ORAL | Status: DC | PRN

## 2014-07-11 NOTE — Discharge Instructions (Signed)
Please follow-up as discussed with Dr. Percell Miller.  Humerus Fracture, Treated with Immobilization The humerus is the large bone in your upper arm. You have a broken (fractured) humerus. These fractures are easily diagnosed with X-rays. TREATMENT  Simple fractures which will heal without disability are treated with simple immobilization. Immobilization means you will wear a cast, splint, or sling. You have a fracture which will do well with immobilization. The fracture will heal well simply by being held in a good position until it is stable enough to begin range of motion exercises. Do not take part in activities which would further injure your arm.  HOME CARE INSTRUCTIONS   Put ice on the injured area.  Put ice in a plastic bag.  Place a towel between your skin and the bag.  Leave the ice on for 15-20 minutes, 03-04 times a day.  If you have a cast:  Do not scratch the skin under the cast using sharp or pointed objects.  Check the skin around the cast every day. You may put lotion on any red or sore areas.  Keep your cast dry and clean.  If you have a splint:  Wear the splint as directed.  Keep your splint dry and clean.  You may loosen the elastic around the splint if your fingers become numb, tingle, or turn cold or blue.  If you have a sling:  Wear the sling as directed.  Do not put pressure on any part of your cast or splint until it is fully hardened.  Your cast or splint can be protected during bathing with a plastic bag. Do not lower the cast or splint into water.  Only take over-the-counter or prescription medicines for pain, discomfort, or fever as directed by your caregiver.  Do range of motion exercises as instructed by your caregiver.  Follow up as directed by your caregiver. This is very important in order to avoid permanent injury or disability and chronic pain. SEEK IMMEDIATE MEDICAL CARE IF:   Your skin or nails in the injured arm turn blue or gray.  Your  arm feels cold or numb.  You develop severe pain in the injured arm.  You are having problems with the medicines you were given. MAKE SURE YOU:   Understand these instructions.  Will watch your condition.  Will get help right away if you are not doing well or get worse. Document Released: 09/13/2000 Document Revised: 08/30/2011 Document Reviewed: 07/22/2010 [facility] Patient Information 2015 [place], [place]. This information is not intended to replace advice given to you by your health care provider. Make sure you discuss any questions you have with your health care provider.

## 2014-07-11 NOTE — Progress Notes (Signed)
Orthopedic Tech Progress Note Patient Details:  Julie [name] 04/29/1976 [id number] Coaptation splint applied with long arm as well. Tolerated fair. Arm sling applied. Ortho Devices Type of Ortho Device: Ace wrap, Arm sling, Post (long arm) splint, Coapt Ortho Device/Splint Location: LUE Ortho Device/Splint Interventions: Application   Asia R Thompson 07/11/2014, 11:21 AM
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
## 2014-07-15 ENCOUNTER — Ambulatory Visit (HOSPITAL_BASED_OUTPATIENT_CLINIC_OR_DEPARTMENT_OTHER): Payer: No Typology Code available for payment source | Admitting: Anesthesiology

## 2014-07-15 ENCOUNTER — Ambulatory Visit (HOSPITAL_BASED_OUTPATIENT_CLINIC_OR_DEPARTMENT_OTHER)
Admission: RE | Admit: 2014-07-15 | Discharge: 2014-07-15 | Disposition: A | Discharge status: Home / Self Care | Payer: Self-pay | Source: Ambulatory Visit | Attending: Orthopedic Surgery | Admitting: Orthopedic Surgery

## 2014-07-15 ENCOUNTER — Encounter (HOSPITAL_BASED_OUTPATIENT_CLINIC_OR_DEPARTMENT_OTHER): Payer: Self-pay | Admitting: *Deleted

## 2014-07-15 ENCOUNTER — Encounter (HOSPITAL_BASED_OUTPATIENT_CLINIC_OR_DEPARTMENT_OTHER)
Admission: RE | Disposition: A | Discharge status: Home / Self Care | Payer: Self-pay | Source: Ambulatory Visit | Attending: Orthopedic Surgery

## 2014-07-15 DIAGNOSIS — S42392A Other fracture of shaft of left humerus, initial encounter for closed fracture: Secondary | ICD-10-CM | POA: Insufficient documentation

## 2014-07-15 DIAGNOSIS — F17210 Nicotine dependence, cigarettes, uncomplicated: Secondary | ICD-10-CM | POA: Insufficient documentation

## 2014-07-15 DIAGNOSIS — Y998 Other external cause status: Secondary | ICD-10-CM | POA: Insufficient documentation

## 2014-07-15 DIAGNOSIS — J45909 Unspecified asthma, uncomplicated: Secondary | ICD-10-CM | POA: Insufficient documentation

## 2014-07-15 DIAGNOSIS — Y92488 Other paved roadways as the place of occurrence of the external cause: Secondary | ICD-10-CM | POA: Insufficient documentation

## 2014-07-15 DIAGNOSIS — Y9389 Activity, other specified: Secondary | ICD-10-CM | POA: Insufficient documentation

## 2014-07-15 HISTORY — PX: ORIF HUMERUS FRACTURE: SHX2126

## 2014-07-15 HISTORY — DX: Personal history of other diseases of the respiratory system: Z87.09

## 2014-07-15 HISTORY — DX: Unspecified fracture of shaft of humerus, unspecified arm, initial encounter for closed fracture: S42.309A

## 2014-07-15 LAB — I-STAT CHEM 8, ED
BUN: 6 mg/dL (ref 6–23)
CREATININE: 0.6 mg/dL (ref 0.50–1.10)
Calcium, Ion: 1.29 mmol/L — ABNORMAL HIGH (ref 1.12–1.23)
Chloride: 110 mmol/L (ref 96–112)
Glucose, Bld: 107 mg/dL — ABNORMAL HIGH (ref 70–99)
HEMATOCRIT: 34 % — AB (ref 36.0–46.0)
HEMOGLOBIN: 11.6 g/dL — AB (ref 12.0–15.0)
Potassium: 3.6 mmol/L (ref 3.5–5.1)
Sodium: 143 mmol/L (ref 135–145)
TCO2: 19 mmol/L (ref 0–100)

## 2014-07-15 LAB — POCT HEMOGLOBIN-HEMACUE: Hemoglobin: 9.1 g/dL — ABNORMAL LOW (ref 12.0–15.0)

## 2014-07-15 SURGERY — OPEN REDUCTION INTERNAL FIXATION (ORIF) HUMERAL SHAFT FRACTURE
Anesthesia: General | Site: Arm Upper | Laterality: Left

## 2014-07-15 MED ORDER — FENTANYL CITRATE 0.05 MG/ML IJ SOLN
INTRAMUSCULAR | Status: AC
  Filled 2014-07-15: qty 6

## 2014-07-15 MED ORDER — MIDAZOLAM HCL 2 MG/ML PO SYRP: 12.0000 mg | ORAL_SOLUTION | Freq: Once | ORAL | Status: DC | PRN

## 2014-07-15 MED ORDER — MIDAZOLAM HCL 2 MG/2ML IJ SOLN
INTRAMUSCULAR | Status: AC
Start: 2014-07-15 — End: 2014-07-15
  Filled 2014-07-15: qty 2

## 2014-07-15 MED ORDER — DEXAMETHASONE SODIUM PHOSPHATE 4 MG/ML IJ SOLN
INTRAMUSCULAR | Status: DC | PRN
  Administered 2014-07-15: 10 mg via INTRAVENOUS

## 2014-07-15 MED ORDER — HYDROMORPHONE HCL 1 MG/ML IJ SOLN
INTRAMUSCULAR | Status: AC
  Filled 2014-07-15: qty 1

## 2014-07-15 MED ORDER — OXYCODONE-ACETAMINOPHEN 5-325 MG PO TABS: 2.0000 | ORAL_TABLET | ORAL | Status: DC | PRN

## 2014-07-15 MED ORDER — CEFAZOLIN SODIUM 10 G IJ SOLR
3.0000 g | INTRAMUSCULAR | Status: AC
  Administered 2014-07-15: 3 g via INTRAVENOUS

## 2014-07-15 MED ORDER — FENTANYL CITRATE 0.05 MG/ML IJ SOLN
INTRAMUSCULAR | Status: AC
  Filled 2014-07-15: qty 2

## 2014-07-15 MED ORDER — CEFAZOLIN SODIUM-DEXTROSE 2-3 GM-% IV SOLR
INTRAVENOUS | Status: AC
  Filled 2014-07-15: qty 50

## 2014-07-15 MED ORDER — HYDROMORPHONE HCL 1 MG/ML IJ SOLN
0.2500 mg | INTRAMUSCULAR | Status: DC | PRN
  Administered 2014-07-15: 0.5 mg via INTRAVENOUS

## 2014-07-15 MED ORDER — DEXTROSE-NACL 5-0.45 % IV SOLN: 100.0000 mL/h | INTRAVENOUS | Status: DC

## 2014-07-15 MED ORDER — PROPOFOL 10 MG/ML IV BOLUS
INTRAVENOUS | Status: DC | PRN
  Administered 2014-07-15: 300 mg via INTRAVENOUS

## 2014-07-15 MED ORDER — 0.9 % SODIUM CHLORIDE (POUR BTL) OPTIME
TOPICAL | Status: DC | PRN
  Administered 2014-07-15: 1000 mL

## 2014-07-15 MED ORDER — CEFAZOLIN SODIUM 1-5 GM-% IV SOLN
INTRAVENOUS | Status: AC
  Filled 2014-07-15: qty 50

## 2014-07-15 MED ORDER — MIDAZOLAM HCL 2 MG/2ML IJ SOLN
INTRAMUSCULAR | Status: AC
  Filled 2014-07-15: qty 2

## 2014-07-15 MED ORDER — BUPIVACAINE-EPINEPHRINE (PF) 0.5% -1:200000 IJ SOLN
INTRAMUSCULAR | Status: DC | PRN
  Administered 2014-07-15: 30 mL via PERINEURAL

## 2014-07-15 MED ORDER — FENTANYL CITRATE 0.05 MG/ML IJ SOLN
50.0000 ug | INTRAMUSCULAR | Status: DC | PRN
  Administered 2014-07-15: 100 ug via INTRAVENOUS

## 2014-07-15 MED ORDER — MEPERIDINE HCL 25 MG/ML IJ SOLN: 6.2500 mg | INTRAMUSCULAR | Status: DC | PRN

## 2014-07-15 MED ORDER — ACETAMINOPHEN 500 MG PO TABS
ORAL_TABLET | ORAL | Status: AC
  Filled 2014-07-15: qty 2

## 2014-07-15 MED ORDER — ONDANSETRON HCL 4 MG/2ML IJ SOLN
INTRAMUSCULAR | Status: DC | PRN
  Administered 2014-07-15: 4 mg via INTRAVENOUS

## 2014-07-15 MED ORDER — LACTATED RINGERS IV SOLN
INTRAVENOUS | Status: DC
  Administered 2014-07-15: 12:00:00 via INTRAVENOUS

## 2014-07-15 MED ORDER — DOCUSATE SODIUM 100 MG PO CAPS: 100.0000 mg | ORAL_CAPSULE | Freq: Two times a day (BID) | ORAL | Status: DC

## 2014-07-15 MED ORDER — MIDAZOLAM HCL 2 MG/2ML IJ SOLN
1.0000 mg | INTRAMUSCULAR | Status: DC | PRN
  Administered 2014-07-15: 2 mg via INTRAVENOUS

## 2014-07-15 MED ORDER — ONDANSETRON HCL 4 MG PO TABS: 4.0000 mg | ORAL_TABLET | Freq: Three times a day (TID) | ORAL | Status: DC | PRN

## 2014-07-15 MED ORDER — LIDOCAINE HCL (CARDIAC) 20 MG/ML IV SOLN
INTRAVENOUS | Status: DC | PRN
  Administered 2014-07-15: 50 mg via INTRAVENOUS

## 2014-07-15 MED ORDER — ONDANSETRON HCL 4 MG/2ML IJ SOLN: 4.0000 mg | Freq: Once | INTRAMUSCULAR | Status: DC | PRN

## 2014-07-15 MED ORDER — ACETAMINOPHEN 500 MG PO TABS
1000.0000 mg | ORAL_TABLET | Freq: Once | ORAL | Status: AC
  Administered 2014-07-15: 1000 mg via ORAL

## 2014-07-15 SURGICAL SUPPLY — 59 items
BANDAGE ELASTIC 6 VELCRO ST LF (GAUZE/BANDAGES/DRESSINGS) ×3 IMPLANT
BIT DRILL 2.6 (BIT) ×3 IMPLANT
BIT DRILL 3.5MM (BIT) ×1
BIT DRILL OVERDRILL 3.5X122 (BIT) ×2 IMPLANT
BLADE CLIPPER SURG (BLADE) IMPLANT
BLADE SURG 15 STRL LF DISP TIS (BLADE) ×1 IMPLANT
BLADE SURG 15 STRL SS (BLADE) ×2
BNDG GAUZE ELAST 4 BULKY (GAUZE/BANDAGES/DRESSINGS) ×3 IMPLANT
CHLORAPREP W/TINT 26ML (MISCELLANEOUS) ×3 IMPLANT
CLOSURE STERI-STRIP 1/2X4 (GAUZE/BANDAGES/DRESSINGS) ×1
CLSR STERI-STRIP ANTIMIC 1/2X4 (GAUZE/BANDAGES/DRESSINGS) ×2 IMPLANT
COVER BACK TABLE 60X90IN (DRAPES) IMPLANT
Compression Plate 8 hole ×3 IMPLANT
DRAPE EXTREMITY T 121X128X90 (DRAPE) IMPLANT
DRAPE OEC MINIVIEW 54X84 (DRAPES) ×3 IMPLANT
DRAPE U 20/CS (DRAPES) ×3 IMPLANT
DRAPE U-SHAPE 47X51 STRL (DRAPES) ×3 IMPLANT
DRAPE U-SHAPE 76X120 STRL (DRAPES) IMPLANT
DRSG PAD ABDOMINAL 8X10 ST (GAUZE/BANDAGES/DRESSINGS) ×3 IMPLANT
ELECT REM PT RETURN 9FT ADLT (ELECTROSURGICAL) ×3
ELECTRODE REM PT RTRN 9FT ADLT (ELECTROSURGICAL) ×1 IMPLANT
GAUZE SPONGE 4X4 12PLY STRL (GAUZE/BANDAGES/DRESSINGS) ×3 IMPLANT
GLOVE BIO SURGEON STRL SZ7.5 (GLOVE) ×3 IMPLANT
GLOVE BIO SURGEON STRL SZ8 (GLOVE) ×3 IMPLANT
GLOVE BIOGEL PI IND STRL 8 (GLOVE) ×2 IMPLANT
GLOVE BIOGEL PI INDICATOR 8 (GLOVE) ×4
GOWN STRL REUS W/ TWL LRG LVL3 (GOWN DISPOSABLE) ×2 IMPLANT
GOWN STRL REUS W/ TWL XL LVL3 (GOWN DISPOSABLE) ×1 IMPLANT
GOWN STRL REUS W/TWL LRG LVL3 (GOWN DISPOSABLE) ×4
GOWN STRL REUS W/TWL XL LVL3 (GOWN DISPOSABLE) ×2
NS IRRIG 1000ML POUR BTL (IV SOLUTION) ×3 IMPLANT
PACK ARTHROSCOPY DSU (CUSTOM PROCEDURE TRAY) ×3 IMPLANT
PACK BASIN DAY SURGERY FS (CUSTOM PROCEDURE TRAY) ×3 IMPLANT
PENCIL BUTTON HOLSTER BLD 10FT (ELECTRODE) ×3 IMPLANT
SCREW BONE 3.5X24MM (Screw) ×9 IMPLANT
SCREW BONE 3.5X26MM (Screw) ×6 IMPLANT
SCREW NON LOCKING 22MM (Screw) ×9 IMPLANT
SLEEVE SCD COMPRESS KNEE MED (MISCELLANEOUS) IMPLANT
SLING ARM IMMOBILIZER LRG (SOFTGOODS) ×3 IMPLANT
SPLINT FAST PLASTER 5X30 (CAST SUPPLIES) ×20
SPLINT PLASTER CAST FAST 5X30 (CAST SUPPLIES) ×10 IMPLANT
SPONGE LAP 18X18 X RAY DECT (DISPOSABLE) ×3 IMPLANT
STAPLER VISISTAT 35W (STAPLE) IMPLANT
SUCTION FRAZIER TIP 10 FR DISP (SUCTIONS) ×3 IMPLANT
SUT ETHILON 3 0 PS 1 (SUTURE) IMPLANT
SUT FIBERWIRE #2 38 T-5 BLUE (SUTURE)
SUT MNCRL AB 4-0 PS2 18 (SUTURE) ×3 IMPLANT
SUT MON AB 2-0 CT1 36 (SUTURE) IMPLANT
SUT RETRIEVER MED (INSTRUMENTS) IMPLANT
SUT VIC AB 0 CT1 27 (SUTURE) ×2
SUT VIC AB 0 CT1 27XBRD ANBCTR (SUTURE) ×1 IMPLANT
SUT VIC AB 2-0 SH 27 (SUTURE) ×2
SUT VIC AB 2-0 SH 27XBRD (SUTURE) ×1 IMPLANT
SUT VIC AB 3-0 SH 27 (SUTURE)
SUT VIC AB 3-0 SH 27X BRD (SUTURE) IMPLANT
SUTURE FIBERWR #2 38 T-5 BLUE (SUTURE) IMPLANT
SYR BULB 3OZ (MISCELLANEOUS) ×3 IMPLANT
TOWEL OR 17X24 6PK STRL BLUE (TOWEL DISPOSABLE) ×3 IMPLANT
YANKAUER SUCT BULB TIP NO VENT (SUCTIONS) ×3 IMPLANT

## 2014-07-15 NOTE — Anesthesia Preprocedure Evaluation (Signed)
Anesthesia Evaluation  Patient identified by MRN, date of birth, ID band Patient awake    Reviewed: Allergy & Precautions, NPO status , Patient's Chart, lab work & pertinent test results  Airway Mallampati: I  TM Distance: >3 FB Neck ROM: Full    Dental   Pulmonary Current Smoker,          Cardiovascular     Neuro/Psych    GI/Hepatic   Endo/Other    Renal/GU      Musculoskeletal   Abdominal   Peds  Hematology   Anesthesia Other Findings   Reproductive/Obstetrics                             Anesthesia Physical Anesthesia Plan  ASA: III  Anesthesia Plan: General   Post-op Pain Management:    Induction: Intravenous  Airway Management Planned: LMA  Additional Equipment:   Intra-op Plan:   Post-operative Plan: Extubation in OR  Informed Consent: I have reviewed the patients History and Physical, chart, labs and discussed the procedure including the risks, benefits and alternatives for the proposed anesthesia with the patient or authorized representative who has indicated his/her understanding and acceptance.     Plan Discussed with: CRNA and Surgeon  Anesthesia Plan Comments:         Anesthesia Quick Evaluation

## 2014-07-15 NOTE — Op Note (Signed)
07/15/2014  2:09 PM  PATIENT:  Julie [name]    PRE-OPERATIVE DIAGNOSIS:  FRACTURE OF SHAFT OF LEFT HUMERUS, CLOSED FRACTURE  POST-OPERATIVE DIAGNOSIS:  Same  PROCEDURE:  OPEN REDUCTION INTERNAL FIXATION (ORIF) LEFT HUMERAL SHAFT   SURGEON:  MURPHY, TIMOTHY, D, MD  ASSISTANT: Joya Gaskins, OPA, He was necessary for efficiency and safety of the case.   ANESTHESIA:   gen  PREOPERATIVE INDICATIONS:  Julie [name] is a  39 y.o. female with a diagnosis of FRACTURE OF SHAFT OF LEFT HUMERUS, CLOSED FRACTURE who failed conservative measures and elected for surgical management.    The risks benefits and alternatives were discussed with the patient preoperatively including but not limited to the risks of infection, bleeding, nerve injury, cardiopulmonary complications, the need for revision surgery, among others, and the patient was willing to proceed.  OPERATIVE IMPLANTS: 3.5 plate stryker variax plate  OPERATIVE FINDINGS: stable reduction and fixation  BLOOD LOSS: 50  COMPLICATIONS: none  TOURNIQUET TIME: none  OPERATIVE PROCEDURE:  Patient was identified in the preoperative holding area and site was marked by me She was transported to the operating theater and placed on the table in supine position taking care to pad all bony prominences. After a preincinduction time out anesthesia was induced. The left upper extremity was prepped and draped in normal sterile fashion and a pre-incision timeout was performed. She received ancef for preoperative antibiotics.   Made a longitudinal incision centered over her fracture site did an approach of Henry. I protected her vein.  I split her brachialis after going around her biceps muscle where it was already ruptured by the fracture.  Identified the fracture cleaned out both ends I then performed a reduction and clamped in the place keeping my clamps on the side rather than going behind the fracture.  I placed 2 lag screws with excellent  bite and was happy with my reduction. I then placed an 8 hole plate with 6 excellent screws with good bites on both sides.  I then took multiple x-rays as happy with the reduction and fixation and placement of hardware.  I then thoroughly irrigated the wound and closed in layers finishing with a Monocryl stitch.  Sterile dressings applied she was taken the PACU in stable condition.  POST OPERATIVE PLAN: sling full time, Ambulate for DVT px    This note was generated using a template and dragon dictation system. In light of that, I have reviewed the note and all aspects of it are applicable to this case. Any dictation errors are due to the computerized dictation system.

## 2014-07-15 NOTE — Anesthesia Postprocedure Evaluation (Signed)
Anesthesia Post Note  Patient: Julie [name]  Procedure(s) Performed: Procedure(s) (LRB): OPEN REDUCTION INTERNAL FIXATION (ORIF) LEFT HUMERAL SHAFT  (Left)  Anesthesia type: general  Patient location: PACU  Post pain: Pain level controlled  Post assessment: Patient's Cardiovascular Status Stable  Last Vitals:  Filed Vitals:   07/15/14 1500  BP: 129/89  Pulse: 56  Temp:   Resp: 21    Post vital signs: Reviewed and stable  Level of consciousness: sedated  Complications: No apparent anesthesia complications

## 2014-07-15 NOTE — Progress Notes (Signed)
Assisted Dr. Ossey with left, ultrasound guided, infraclavicular block. Side rails up, monitors on throughout procedure. See vital signs in flow sheet. Tolerated Procedure well. 
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
## 2014-07-15 NOTE — Discharge Instructions (Signed)
Keep your dressings on and dry until follow up. Come in for a dressing change if they become soiled  Stay in your sling full time.    Post Anesthesia Home Care Instructions  Activity: Get plenty of rest for the remainder of the day. A responsible adult should stay with you for 24 hours following the procedure.  For the next 24 hours, DO NOT: -Drive a car -Paediatric nurse -Drink alcoholic beverages -Take any medication unless instructed by your physician -Make any legal decisions or sign important papers.  Meals: Start with liquid foods such as gelatin or soup. Progress to regular foods as tolerated. Avoid greasy, spicy, heavy foods. If nausea and/or vomiting occur, drink only clear liquids until the nausea and/or vomiting subsides. Call your physician if vomiting continues.  Special Instructions/Symptoms: Your throat may feel dry or sore from the anesthesia or the breathing tube placed in your throat during surgery. If this causes discomfort, gargle with warm salt water. The discomfort should disappear within 24 hours.   Regional Anesthesia Blocks  1. Numbness or the inability to move the "blocked" extremity may last from 3-48 hours after placement. The length of time depends on the medication injected and your individual response to the medication. If the numbness is not going away after 48 hours, call your surgeon.  2. The extremity that is blocked will need to be protected until the numbness is gone and the  Strength has returned. Because you cannot feel it, you will need to take extra care to avoid injury. Because it may be weak, you may have difficulty moving it or using it. You may not know what position it is in without looking at it while the block is in effect.  3. For blocks in the legs and feet, returning to weight bearing and walking needs to be done carefully. You will need to wait until the numbness is entirely gone and the strength has returned. You should be able to  move your leg and foot normally before you try and bear weight or walk. You will need someone to be with you when you first try to ensure you do not fall and possibly risk injury.  4. Bruising and tenderness at the needle site are common side effects and will resolve in a few days.  5. Persistent numbness or new problems with movement should be communicated to the surgeon or the [facility] [phone] [place] [phone]).

## 2014-07-15 NOTE — Interval H&P Note (Signed)
History and Physical Interval Note:  07/15/2014 12:03 PM  Julie [name]  has presented today for surgery, with the diagnosis of FRACTURE OF SHAFT OF LEFT HUMERUS, CLOSED FRACTURE  The various methods of treatment have been discussed with the patient and family. After consideration of risks, benefits and other options for treatment, the patient has consented to  Procedure(s): OPEN REDUCTION INTERNAL FIXATION (ORIF) LEFT HUMERAL SHAFT  (Left) as a surgical intervention .  The patient's history has been reviewed, patient examined, no change in status, stable for surgery.  I have reviewed the patient's chart and labs.  Questions were answered to the patient's satisfaction.     Alok Minshall, D

## 2014-07-15 NOTE — Anesthesia Procedure Notes (Addendum)
Anesthesia Regional Block:  Supraclavicular block  Pre-Anesthetic Checklist: ,, timeout performed, Correct Patient, Correct Site, Correct Laterality, Correct Procedure, Correct Position, site marked, Risks and benefits discussed,  Surgical consent,  Pre-op evaluation,  At surgeon's request and post-op pain management  Laterality: Left  Prep: chloraprep       Needles:  Injection technique: Single-shot  Needle Type: Echogenic Stimulator Needle     Needle Length: 9cm 9 cm Needle Gauge: 21 and 21 G    Additional Needles:  Procedures: ultrasound guided (picture in chart) and nerve stimulator Supraclavicular block  Nerve Stimulator or Paresthesia:  Response: 0.4 mA,   Additional Responses:   Narrative:  Start time: 07/15/2014 12:40 PM End time: 07/15/2014 12:50 PM Injection made incrementally with aspirations every 5 mL.  Performed by: Personally  Anesthesiologist: Lillia Abed  Additional Notes: Monitors applied. Patient sedated. Sterile prep and drape,hand hygiene and sterile gloves were used. Relevant anatomy identified.Needle position confirmed.Local anesthetic injected incrementally after negative aspiration. Local anesthetic spread visualized around nerve(s). Vascular puncture avoided. No complications. Image printed for medical record.The patient tolerated the procedure well.        Procedure Name: LMA Insertion Performed by: Terrance Mass Pre-anesthesia Checklist: Patient identified, Timeout performed, Emergency Drugs available, Suction available and Patient being monitored Patient Re-evaluated:Patient Re-evaluated prior to inductionOxygen Delivery Method: Circle system utilized Preoxygenation: Pre-oxygenation with 100% oxygen Intubation Type: IV induction Ventilation: Mask ventilation without difficulty LMA: LMA inserted LMA Size: 5.0 Tube type: Oral Number of attempts: 1 Placement Confirmation: positive ETCO2 Tube secured with: Tape Dental Injury: Teeth  and Oropharynx as per pre-operative assessment

## 2014-07-15 NOTE — Transfer of Care (Signed)
Immediate Anesthesia Transfer of Care Note  Patient: Julie [name]  Procedure(s) Performed: Procedure(s): OPEN REDUCTION INTERNAL FIXATION (ORIF) LEFT HUMERAL SHAFT  (Left)  Patient Location: PACU  Anesthesia Type:General  Level of Consciousness: awake, alert  and oriented  Airway & Oxygen Therapy: Patient Spontanous Breathing and Patient connected to face mask oxygen  Post-op Assessment: Report given to PACU RN and Post -op Vital signs reviewed and stable  Post vital signs: Reviewed and stable  Complications: No apparent anesthesia complications

## 2014-07-16 ENCOUNTER — Encounter (HOSPITAL_BASED_OUTPATIENT_CLINIC_OR_DEPARTMENT_OTHER): Payer: Self-pay | Admitting: Orthopedic Surgery

## 2014-07-22 ENCOUNTER — Emergency Department: Payer: Self-pay | Admitting: Emergency Medicine

## 2014-09-20 ENCOUNTER — Emergency Department (HOSPITAL_COMMUNITY): Payer: Self-pay

## 2014-09-20 ENCOUNTER — Encounter (HOSPITAL_COMMUNITY): Payer: Self-pay | Admitting: *Deleted

## 2014-09-20 ENCOUNTER — Emergency Department (HOSPITAL_COMMUNITY)
Admission: EM | Admit: 2014-09-20 | Discharge: 2014-09-20 | Disposition: A | Discharge status: Home / Self Care | Payer: Self-pay | Attending: Emergency Medicine | Admitting: Emergency Medicine

## 2014-09-20 DIAGNOSIS — Z8781 Personal history of (healed) traumatic fracture: Secondary | ICD-10-CM | POA: Insufficient documentation

## 2014-09-20 DIAGNOSIS — R059 Cough, unspecified: Secondary | ICD-10-CM

## 2014-09-20 DIAGNOSIS — R000 Tachycardia, unspecified: Secondary | ICD-10-CM | POA: Insufficient documentation

## 2014-09-20 DIAGNOSIS — Z79899 Other long term (current) drug therapy: Secondary | ICD-10-CM | POA: Insufficient documentation

## 2014-09-20 DIAGNOSIS — Z720 Tobacco use: Secondary | ICD-10-CM | POA: Insufficient documentation

## 2014-09-20 DIAGNOSIS — R197 Diarrhea, unspecified: Secondary | ICD-10-CM | POA: Insufficient documentation

## 2014-09-20 DIAGNOSIS — J45901 Unspecified asthma with (acute) exacerbation: Secondary | ICD-10-CM | POA: Insufficient documentation

## 2014-09-20 DIAGNOSIS — R112 Nausea with vomiting, unspecified: Secondary | ICD-10-CM | POA: Insufficient documentation

## 2014-09-20 DIAGNOSIS — R05 Cough: Secondary | ICD-10-CM | POA: Insufficient documentation

## 2014-09-20 DIAGNOSIS — M25512 Pain in left shoulder: Secondary | ICD-10-CM | POA: Insufficient documentation

## 2014-09-20 DIAGNOSIS — R6889 Other general symptoms and signs: Secondary | ICD-10-CM

## 2014-09-20 DIAGNOSIS — Z7982 Long term (current) use of aspirin: Secondary | ICD-10-CM | POA: Insufficient documentation

## 2014-09-20 DIAGNOSIS — Z3202 Encounter for pregnancy test, result negative: Secondary | ICD-10-CM | POA: Insufficient documentation

## 2014-09-20 HISTORY — DX: Unspecified asthma, uncomplicated: J45.909

## 2014-09-20 LAB — URINE MICROSCOPIC-ADD ON

## 2014-09-20 LAB — D-DIMER, QUANTITATIVE (NOT AT ARMC): D DIMER QUANT: 0.39 ug{FEU}/mL (ref 0.00–0.48)

## 2014-09-20 LAB — CBC WITH DIFFERENTIAL/PLATELET
BASOS ABS: 0 10*3/uL (ref 0.0–0.1)
Basophils Relative: 0 % (ref 0–1)
EOS ABS: 0 10*3/uL (ref 0.0–0.7)
Eosinophils Relative: 0 % (ref 0–5)
HCT: 36.7 % (ref 36.0–46.0)
HEMOGLOBIN: 10.9 g/dL — AB (ref 12.0–15.0)
LYMPHS PCT: 15 % (ref 12–46)
Lymphs Abs: 2.1 10*3/uL (ref 0.7–4.0)
MCH: 20.1 pg — ABNORMAL LOW (ref 26.0–34.0)
MCHC: 29.7 g/dL — ABNORMAL LOW (ref 30.0–36.0)
MCV: 67.7 fL — AB (ref 78.0–100.0)
Monocytes Absolute: 1.1 10*3/uL — ABNORMAL HIGH (ref 0.1–1.0)
Monocytes Relative: 8 % (ref 3–12)
NEUTROS ABS: 10.6 10*3/uL — AB (ref 1.7–7.7)
NEUTROS PCT: 77 % (ref 43–77)
PLATELETS: 311 10*3/uL (ref 150–400)
RBC: 5.42 MIL/uL — ABNORMAL HIGH (ref 3.87–5.11)
RDW: 18.7 % — AB (ref 11.5–15.5)
WBC: 13.8 10*3/uL — AB (ref 4.0–10.5)

## 2014-09-20 LAB — BASIC METABOLIC PANEL
ANION GAP: 12 (ref 5–15)
BUN: 14 mg/dL (ref 6–23)
CO2: 21 mmol/L (ref 19–32)
Calcium: 10.2 mg/dL (ref 8.4–10.5)
Chloride: 105 mmol/L (ref 96–112)
Creatinine, Ser: 1.12 mg/dL — ABNORMAL HIGH (ref 0.50–1.10)
GFR calc non Af Amer: 61 mL/min — ABNORMAL LOW (ref >90)
GFR, EST AFRICAN AMERICAN: 71 mL/min — AB (ref >90)
Glucose, Bld: 148 mg/dL — ABNORMAL HIGH (ref 70–99)
POTASSIUM: 3.9 mmol/L (ref 3.5–5.1)
Sodium: 138 mmol/L (ref 135–145)

## 2014-09-20 LAB — URINALYSIS, ROUTINE W REFLEX MICROSCOPIC
GLUCOSE, UA: NEGATIVE mg/dL
Hgb urine dipstick: NEGATIVE
Ketones, ur: 15 mg/dL — AB
Nitrite: NEGATIVE
Protein, ur: 100 mg/dL — AB
SPECIFIC GRAVITY, URINE: 1.037 — AB (ref 1.005–1.030)
Urobilinogen, UA: 1 mg/dL (ref 0.0–1.0)
pH: 5.5 (ref 5.0–8.0)

## 2014-09-20 LAB — I-STAT CG4 LACTIC ACID, ED: LACTIC ACID, VENOUS: 1.74 mmol/L (ref 0.5–2.0)

## 2014-09-20 LAB — POC URINE PREG, ED: Preg Test, Ur: NEGATIVE

## 2014-09-20 MED ORDER — SODIUM CHLORIDE 0.9 % IV SOLN: 1000.0000 mL | INTRAVENOUS | Status: DC

## 2014-09-20 MED ORDER — IPRATROPIUM-ALBUTEROL 0.5-2.5 (3) MG/3ML IN SOLN
3.0000 mL | Freq: Once | RESPIRATORY_TRACT | Status: AC
  Administered 2014-09-20: 3 mL via RESPIRATORY_TRACT
  Filled 2014-09-20: qty 3

## 2014-09-20 MED ORDER — ONDANSETRON HCL 4 MG/2ML IJ SOLN
4.0000 mg | Freq: Once | INTRAMUSCULAR | Status: AC
  Administered 2014-09-20: 4 mg via INTRAVENOUS
  Filled 2014-09-20: qty 2

## 2014-09-20 MED ORDER — OXYCODONE-ACETAMINOPHEN 5-325 MG PO TABS
1.0000 | ORAL_TABLET | Freq: Once | ORAL | Status: AC
Start: 2014-09-20 — End: 2014-09-20
  Administered 2014-09-20: 1 via ORAL
  Filled 2014-09-20: qty 1

## 2014-09-20 MED ORDER — SODIUM CHLORIDE 0.9 % IV SOLN
1000.0000 mL | Freq: Once | INTRAVENOUS | Status: AC
  Administered 2014-09-20: 1000 mL via INTRAVENOUS

## 2014-09-20 MED ORDER — ONDANSETRON 4 MG PO TBDP
8.0000 mg | ORAL_TABLET | Freq: Once | ORAL | Status: AC
  Administered 2014-09-20: 8 mg via ORAL
  Filled 2014-09-20: qty 2

## 2014-09-20 MED ORDER — METOCLOPRAMIDE HCL 10 MG PO TABS: 10.0000 mg | ORAL_TABLET | Freq: Four times a day (QID) | ORAL | Status: DC | PRN

## 2014-09-20 MED ORDER — KETOROLAC TROMETHAMINE 30 MG/ML IJ SOLN
30.0000 mg | Freq: Once | INTRAMUSCULAR | Status: AC
  Administered 2014-09-20: 30 mg via INTRAVENOUS
  Filled 2014-09-20: qty 1

## 2014-09-20 MED ORDER — ALBUTEROL SULFATE HFA 108 (90 BASE) MCG/ACT IN AERS
2.0000 | INHALATION_SPRAY | RESPIRATORY_TRACT | Status: DC | PRN
  Administered 2014-09-20: 2 via RESPIRATORY_TRACT
  Filled 2014-09-20: qty 6.7

## 2014-09-20 MED ORDER — LOPERAMIDE HCL 2 MG PO CAPS
4.0000 mg | ORAL_CAPSULE | Freq: Once | ORAL | Status: AC
  Administered 2014-09-20: 4 mg via ORAL
  Filled 2014-09-20: qty 2

## 2014-09-20 NOTE — ED Provider Notes (Signed)
CSN: [id number]     Arrival date & time 09/20/14  1611 History   First MD Initiated Contact with Patient 09/20/14 1705     Chief Complaint  Patient presents with  . Shortness of Breath     (Consider location/radiation/quality/duration/timing/severity/associated sxs/prior Treatment) Patient is a 39 y.o. female presenting with shortness of breath. The history is provided by the patient.  Shortness of Breath She comes in with a three-day history of cough, body aches, nausea, vomiting, diarrhea. She also complains of headache which is worse when she coughs. Cough is productive of some clear sputum. She had recent surgery on her left shoulder and she felt that it popped and it is been painful since then. She denies fever but has had chills and sweats. She has not taken anything to treat her symptoms. She denies any sick contacts but did not get an influenza immunization this season.   Past Medical History  Diagnosis Date  . History of bronchitis   . Fracture, humerus closed, shaft 07/11/2014    left  . Asthma    Past Surgical History  Procedure Laterality Date  . Cesarean section    . Ectopic pregnancy surgery    . Lipoma excision      chest  . Tubal ligation    . Orif humerus fracture Left 07/15/2014    Procedure: OPEN REDUCTION INTERNAL FIXATION (ORIF) LEFT HUMERAL SHAFT ;  Surgeon: Renette Butters, MD;  Location: [facility];  Service: Orthopedics;  Laterality: Left;   No family history on file. History  Substance Use Topics  . Smoking status: Current Every Day Smoker -- 18 years    Types: Cigarettes  . Smokeless tobacco: Never Used     Comment: 6 cig./day  . Alcohol Use: Yes     Comment: occasionally   OB History    No data available     Review of Systems  Respiratory: Positive for shortness of breath.   All other systems reviewed and are negative.     Allergies  Review of patient's allergies indicates no known allergies.  Home Medications   Prior  to Admission medications   Medication Sig Start Date End Date Taking? Authorizing Provider  aspirin 500 MG tablet Take 500 mg by mouth every 6 (six) hours as needed for pain.    Historical Provider, MD  diphenhydramine-acetaminophen (TYLENOL PM) 25-500 MG TABS Take 1 tablet by mouth at bedtime as needed.    Historical Provider, MD  docusate sodium (COLACE) 100 MG capsule Take 1 capsule (100 mg total) by mouth 2 (two) times daily. 07/15/14   Renette Butters, MD  ondansetron (ZOFRAN) 4 MG tablet Take 1 tablet (4 mg total) by mouth every 8 (eight) hours as needed for nausea or vomiting. 07/15/14   Renette Butters, MD  oxyCODONE-acetaminophen (PERCOCET/ROXICET) 5-325 MG per tablet Take 1 tablet by mouth every 4 (four) hours as needed for severe pain.    Historical Provider, MD  oxyCODONE-acetaminophen (ROXICET) 5-325 MG per tablet Take 2 tablets by mouth every 4 (four) hours as needed. 07/15/14   Renette Butters, MD   BP 101/73 mmHg  Pulse 145  Temp(Src) 99.2 F (37.3 C) (Oral)  Resp 24  Ht 5\' 10"  (1.778 m)  Wt 273 lb 8 oz (124.059 kg)  BMI 39.24 kg/m2  SpO2 100%  LMP 08/20/2014 Physical Exam  Nursing note and vitals reviewed.  39 year old female, resting comfortably and in no acute distress. Vital signs are significant  for tachycardia and tachypnea. Oxygen saturation is 100%, which is normal. Head is normocephalic and atraumatic. PERRLA, EOMI. Oropharynx is clear. Neck is nontender and supple without adenopathy or JVD. Back is nontender and there is no CVA tenderness. Lungs are clear without rales, wheezes, or rhonchi. Chest is nontender. Heart is tachycardic without murmur. Abdomen is soft, flat, nontender without masses or hepatosplenomegaly and peristalsis is hypoactive. Extremities have no cyanosis or edema, full range of motion is present. Skin is warm and dry without rash. Neurologic: Mental status is normal, cranial nerves are intact, there are no motor or sensory deficits.  ED  Course  Procedures (including critical care time) Labs Review Results for orders placed or performed during the hospital encounter of 90/24/09  Basic metabolic panel    (if pt has PMH of COPD)  Result Value Ref Range   Sodium 138 135 - 145 mmol/L   Potassium 3.9 3.5 - 5.1 mmol/L   Chloride 105 96 - 112 mmol/L   CO2 21 19 - 32 mmol/L   Glucose, Bld 148 (H) 70 - 99 mg/dL   BUN 14 6 - 23 mg/dL   Creatinine, Ser 1.12 (H) 0.50 - 1.10 mg/dL   Calcium 10.2 8.4 - 10.5 mg/dL   GFR calc non Af Amer 61 (L) >90 mL/min   GFR calc Af Amer 71 (L) >90 mL/min   Anion gap 12 5 - 15  CBC with Differential  Result Value Ref Range   WBC 13.8 (H) 4.0 - 10.5 K/uL   RBC 5.42 (H) 3.87 - 5.11 MIL/uL   Hemoglobin 10.9 (L) 12.0 - 15.0 g/dL   HCT 36.7 36.0 - 46.0 %   MCV 67.7 (L) 78.0 - 100.0 fL   MCH 20.1 (L) 26.0 - 34.0 pg   MCHC 29.7 (L) 30.0 - 36.0 g/dL   RDW 18.7 (H) 11.5 - 15.5 %   Platelets 311 150 - 400 K/uL   Neutrophils Relative % 77 43 - 77 %   Lymphocytes Relative 15 12 - 46 %   Monocytes Relative 8 3 - 12 %   Eosinophils Relative 0 0 - 5 %   Basophils Relative 0 0 - 1 %   Neutro Abs 10.6 (H) 1.7 - 7.7 K/uL   Lymphs Abs 2.1 0.7 - 4.0 K/uL   Monocytes Absolute 1.1 (H) 0.1 - 1.0 K/uL   Eosinophils Absolute 0.0 0.0 - 0.7 K/uL   Basophils Absolute 0.0 0.0 - 0.1 K/uL   RBC Morphology ELLIPTOCYTES    Smear Review LARGE PLATELETS PRESENT   D-dimer, quantitative  Result Value Ref Range   D-Dimer, Quant 0.39 0.00 - 0.48 ug/mL-FEU  Urinalysis, Routine w reflex microscopic  Result Value Ref Range   Color, Urine ORANGE (A) YELLOW   APPearance TURBID (A) CLEAR   Specific Gravity, Urine 1.037 (H) 1.005 - 1.030   pH 5.5 5.0 - 8.0   Glucose, UA NEGATIVE NEGATIVE mg/dL   Hgb urine dipstick NEGATIVE NEGATIVE   Bilirubin Urine SMALL (A) NEGATIVE   Ketones, ur 15 (A) NEGATIVE mg/dL   Protein, ur 100 (A) NEGATIVE mg/dL   Urobilinogen, UA 1.0 0.0 - 1.0 mg/dL   Nitrite NEGATIVE NEGATIVE   Leukocytes,  UA SMALL (A) NEGATIVE  Urine microscopic-add on  Result Value Ref Range   Squamous Epithelial / LPF MANY (A) RARE   WBC, UA 0-2 <3 WBC/hpf   RBC / HPF 0-2 <3 RBC/hpf   Bacteria, UA FEW (A) RARE   Urine-Other AMORPHOUS URATES/PHOSPHATES  I-Stat CG4 Lactic Acid, ED  Result Value Ref Range   Lactic Acid, Venous 1.74 0.5 - 2.0 mmol/L  POC urine preg, ED  Result Value Ref Range   Preg Test, Ur NEGATIVE NEGATIVE   Imaging Review Dg Chest 2 View  09/20/2014   CLINICAL DATA:  Dyspnea for 3 days.  EXAM: CHEST  2 VIEW  COMPARISON:  07/11/2014  FINDINGS: There is mild interstitial coarsening in the left lung which is similar to the 10/05/2011 examination. This may represent bronchiectasis. Cannot exclude superimposed early interstitial infiltrates. No confluent airspace opacity is evident. There are no effusions. Heart size is unchanged, normal. Hilar and mediastinal contours are unremarkable and unchanged.  IMPRESSION: Mild interstitial coarsening in the central and basilar left lung, similar to the 10/05/2011 examination and suspicious for a chronic disease process such as bronchiectasis. There is no confluent airspace opacity, and no effusion.   Electronically Signed   By: Andreas Newport M.D.   On: 09/20/2014 18:52   Dg Shoulder Left  09/20/2014   CLINICAL DATA:  Known history of prior humeral fracture with column of anterior shoulder pain, initial encounter  EXAM: LEFT SHOULDER - 2+ VIEW  COMPARISON:  07/11/2014  FINDINGS: There changes consistent with prior ORIF of the midshaft fracture in the left humerus. No acute fracture dislocation is seen. Mild degenerative changes of the acromioclavicular joint are noted.  IMPRESSION: No acute abnormality seen.   Electronically Signed   By: Inez Catalina M.D.   On: 09/20/2014 18:52     EKG Interpretation   Date/Time:  Friday September 20 2014 16:50:53 EDT Ventricular Rate:  148 PR Interval:  92 QRS Duration: 74 QT Interval:  292 QTC Calculation: 458 R  Axis:   -11 Text Interpretation:  Sinus tachycardia with short PR Right atrial  enlargement Nonspecific ST and T wave abnormality Abnormal ECG When  compared with ECG of 03/02/1999, Sinus tachycardia has replaced Sinus  bradycardia Nonspecific ST abnormality is now Present - possibly  rate-related Confirmed by [facility]  MD, Artina Minella ([id number]) on 09/20/2014 5:25:21 PM      MDM   Final diagnoses:  Flu-like symptoms  Cough  Nausea vomiting and diarrhea    Symptom complex of cough, vomiting, diarrhea, body aches strongly suggestive of influenza. However, review of past records shows that she is only a couple months post arm surgery. Need to consider possibility of pulmonary embolism with marked tachycardia. She will be given IV hydration and ketorolac for pain, ondansetron for nausea, loperamide for diarrhea. She'll be given albuterol with ipratropium via nebulizer to try to suppress her cough. Chest x-ray and shoulder x-rays have been ordered at triage.  X-rays are unremarkable. She had significant improvement in cough with albuterol with ipratropium. Repeat breathing treatment gave further relief. She had good relief of nausea with ondansetron. She is discharged with instructions to use over-the-counter medications for pain and diarrhea. She's given a prescription for metoclopramide and is given an albuterol inhaler to take home with her.  Delora Fuel, MD 50/[id number]/26 [id number]
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
## 2014-09-20 NOTE — Discharge Instructions (Signed)
Take loperamide (Imodium AD as needed for diarrhea.  Cough, Adult  A cough is a reflex that helps clear your throat and airways. It can help heal the body or may be a reaction to an irritated airway. A cough may only last 2 or 3 weeks (acute) or may last more than 8 weeks (chronic).  CAUSES Acute cough:  Viral or bacterial infections. Chronic cough:  Infections.  Allergies.  Asthma.  Post-nasal drip.  Smoking.  Heartburn or acid reflux.  Some medicines.  Chronic lung problems (COPD).  Cancer. SYMPTOMS   Cough.  Fever.  Chest pain.  Increased breathing rate.  High-pitched whistling sound when breathing (wheezing).  Colored mucus that you cough up (sputum). TREATMENT   A bacterial cough may be treated with antibiotic medicine.  A viral cough must run its course and will not respond to antibiotics.  Your caregiver may recommend other treatments if you have a chronic cough. HOME CARE INSTRUCTIONS   Only take over-the-counter or prescription medicines for pain, discomfort, or fever as directed by your caregiver. Use cough suppressants only as directed by your caregiver.  Use a cold steam vaporizer or humidifier in your bedroom or home to help loosen secretions.  Sleep in a semi-upright position if your cough is worse at night.  Rest as needed.  Stop smoking if you smoke. SEEK IMMEDIATE MEDICAL CARE IF:   You have pus in your sputum.  Your cough starts to worsen.  You cannot control your cough with suppressants and are losing sleep.  You begin coughing up blood.  You have difficulty breathing.  You develop pain which is getting worse or is uncontrolled with medicine.  You have a fever. MAKE SURE YOU:   Understand these instructions.  Will watch your condition.  Will get help right away if you are not doing well or get worse. Document Released: 12/04/2010 Document Revised: 08/30/2011 Document Reviewed: 12/04/2010 [facility] Patient Information  2015 [place], [place]. This information is not intended to replace advice given to you by your health care provider. Make sure you discuss any questions you have with your health care provider.  Nausea and Vomiting Nausea is a sick feeling that often comes before throwing up (vomiting). Vomiting is a reflex where stomach contents come out of your mouth. Vomiting can cause severe loss of body fluids (dehydration). Children and elderly adults can become dehydrated quickly, especially if they also have diarrhea. Nausea and vomiting are symptoms of a condition or disease. It is important to find the cause of your symptoms. CAUSES   Direct irritation of the stomach lining. This irritation can result from increased acid production (gastroesophageal reflux disease), infection, food poisoning, taking certain medicines (such as nonsteroidal anti-inflammatory drugs), alcohol use, or tobacco use.  Signals from the brain.These signals could be caused by a headache, heat exposure, an inner ear disturbance, increased pressure in the brain from injury, infection, a tumor, or a concussion, pain, emotional stimulus, or metabolic problems.  An obstruction in the gastrointestinal tract (bowel obstruction).  Illnesses such as diabetes, hepatitis, gallbladder problems, appendicitis, kidney problems, cancer, sepsis, atypical symptoms of a heart attack, or eating disorders.  Medical treatments such as chemotherapy and radiation.  Receiving medicine that makes you sleep (general anesthetic) during surgery. DIAGNOSIS Your caregiver may ask for tests to be done if the problems do not improve after a few days. Tests may also be done if symptoms are severe or if the reason for the nausea and vomiting is not clear. Tests  may include:  Urine tests.  Blood tests.  Stool tests.  Cultures (to look for evidence of infection).  X-rays or other imaging studies. Test results can help your caregiver make decisions about  treatment or the need for additional tests. TREATMENT You need to stay well hydrated. Drink frequently but in small amounts.You may wish to drink water, sports drinks, clear broth, or eat frozen ice pops or gelatin dessert to help stay hydrated.When you eat, eating slowly may help prevent nausea.There are also some antinausea medicines that may help prevent nausea. HOME CARE INSTRUCTIONS   Take all medicine as directed by your caregiver.  If you do not have an appetite, do not force yourself to eat. However, you must continue to drink fluids.  If you have an appetite, eat a normal diet unless your caregiver tells you differently.  Eat a variety of complex carbohydrates (rice, wheat, potatoes, bread), lean meats, yogurt, fruits, and vegetables.  Avoid high-fat foods because they are more difficult to digest.  Drink enough water and fluids to keep your urine clear or pale yellow.  If you are dehydrated, ask your caregiver for specific rehydration instructions. Signs of dehydration may include:  Severe thirst.  Dry lips and mouth.  Dizziness.  Dark urine.  Decreasing urine frequency and amount.  Confusion.  Rapid breathing or pulse. SEEK IMMEDIATE MEDICAL CARE IF:   You have blood or brown flecks (like coffee grounds) in your vomit.  You have black or bloody stools.  You have a severe headache or stiff neck.  You are confused.  You have severe abdominal pain.  You have chest pain or trouble breathing.  You do not urinate at least once every 8 hours.  You develop cold or clammy skin.  You continue to vomit for longer than 24 to 48 hours.  You have a fever. MAKE SURE YOU:   Understand these instructions.  Will watch your condition.  Will get help right away if you are not doing well or get worse. Document Released: 06/07/2005 Document Revised: 08/30/2011 Document Reviewed: 11/04/2010 [facility] Patient Information 2015 [place], [place]. This information is not  intended to replace advice given to you by your health care provider. Make sure you discuss any questions you have with your health care provider.  Diarrhea Diarrhea is frequent loose and watery bowel movements. It can cause you to feel weak and dehydrated. Dehydration can cause you to become tired and thirsty, have a dry mouth, and have decreased urination that often is dark yellow. Diarrhea is a sign of another problem, most often an infection that will not last long. In most cases, diarrhea typically lasts 2-3 days. However, it can last longer if it is a sign of something more serious. It is important to treat your diarrhea as directed by your caregiver to lessen or prevent future episodes of diarrhea. CAUSES  Some common causes include:  Gastrointestinal infections caused by viruses, bacteria, or parasites.  Food poisoning or food allergies.  Certain medicines, such as antibiotics, chemotherapy, and laxatives.  Artificial sweeteners and fructose.  Digestive disorders. HOME CARE INSTRUCTIONS  Ensure adequate fluid intake (hydration): Have 1 cup (8 oz) of fluid for each diarrhea episode. Avoid fluids that contain simple sugars or sports drinks, fruit juices, whole milk products, and sodas. Your urine should be clear or pale yellow if you are drinking enough fluids. Hydrate with an oral rehydration solution that you can purchase at pharmacies, retail stores, and online. You can prepare an oral rehydration solution  at home by mixing the following ingredients together:   - tsp table salt.   tsp baking soda.   tsp salt substitute containing potassium chloride.  1  tablespoons sugar.  1 L (34 oz) of water.  Certain foods and beverages may increase the speed at which food moves through the gastrointestinal (GI) tract. These foods and beverages should be avoided and include:  Caffeinated and alcoholic beverages.  High-fiber foods, such as raw fruits and vegetables, nuts, seeds, and  whole grain breads and cereals.  Foods and beverages sweetened with sugar alcohols, such as xylitol, sorbitol, and mannitol.  Some foods may be well tolerated and may help thicken stool including:  Starchy foods, such as rice, toast, pasta, low-sugar cereal, oatmeal, grits, baked potatoes, crackers, and bagels.  Bananas.  Applesauce.  Add probiotic-rich foods to help increase healthy bacteria in the GI tract, such as yogurt and fermented milk products.  Wash your hands well after each diarrhea episode.  Only take over-the-counter or prescription medicines as directed by your caregiver.  Take a warm bath to relieve any burning or pain from frequent diarrhea episodes. SEEK IMMEDIATE MEDICAL CARE IF:   You are unable to keep fluids down.  You have persistent vomiting.  You have blood in your stool, or your stools are black and tarry.  You do not urinate in 6-8 hours, or there is only a small amount of very dark urine.  You have abdominal pain that increases or localizes.  You have weakness, dizziness, confusion, or light-headedness.  You have a severe headache.  Your diarrhea gets worse or does not get better.  You have a fever or persistent symptoms for more than 2-3 days.  You have a fever and your symptoms suddenly get worse. MAKE SURE YOU:   Understand these instructions.  Will watch your condition.  Will get help right away if you are not doing well or get worse. Document Released: 05/28/2002 Document Revised: 10/22/2013 Document Reviewed: 02/13/2012 [facility] Patient Information 2015 [place], [place]. This information is not intended to replace advice given to you by your health care provider. Make sure you discuss any questions you have with your health care provider.  Albuterol inhalation aerosol What is this medicine? ALBUTEROL (al Norm[place]) is a bronchodilator. It helps open up the airways in your lungs to make it easier to breathe. This medicine is used  to treat and to prevent bronchospasm. This medicine may be used for other purposes; ask your health care provider or pharmacist if you have questions. COMMON BRAND NAME(S): Proair HFA, Proventil, Proventil HFA, Respirol, Ventolin, Ventolin HFA What should I tell my health care provider before I take this medicine? They need to know if you have any of the following conditions: -diabetes -heart disease or irregular heartbeat -high blood pressure -pheochromocytoma -seizures -thyroid disease -an unusual or allergic reaction to albuterol, levalbuterol, sulfites, other medicines, foods, dyes, or preservatives -pregnant or trying to get pregnant -breast-feeding How should I use this medicine? This medicine is for inhalation through the mouth. Follow the directions on your prescription label. Take your medicine at regular intervals. Do not use more often than directed. Make sure that you are using your inhaler correctly. Ask you doctor or health care provider if you have any questions. Talk to your pediatrician regarding the use of this medicine in children. Special care may be needed. Overdosage: If you think you have taken too much of this medicine contact a poison control center or emergency room at  once. NOTE: This medicine is only for you. Do not share this medicine with others. What if I miss a dose? If you miss a dose, use it as soon as you can. If it is almost time for your next dose, use only that dose. Do not use double or extra doses. What may interact with this medicine? -anti-infectives like chloroquine and pentamidine -caffeine -cisapride -diuretics -medicines for colds -medicines for depression or for emotional or psychotic conditions -medicines for weight loss including some herbal products -methadone -some antibiotics like clarithromycin, erythromycin, levofloxacin, and linezolid -some heart medicines -steroid hormones like dexamethasone, cortisone,  hydrocortisone -theophylline -thyroid hormones This list may not describe all possible interactions. Give your health care provider a list of all the medicines, herbs, non-prescription drugs, or dietary supplements you use. Also tell them if you smoke, drink alcohol, or use illegal drugs. Some items may interact with your medicine. What should I watch for while using this medicine? Tell your doctor or health care professional if your symptoms do not improve. Do not use extra albuterol. If your asthma or bronchitis gets worse while you are using this medicine, call your doctor right away. If your mouth gets dry try chewing sugarless gum or sucking hard candy. Drink water as directed. What side effects may I notice from receiving this medicine? Side effects that you should report to your doctor or health care professional as soon as possible: -allergic reactions like skin rash, itching or hives, swelling of the face, lips, or tongue -breathing problems -chest pain -feeling faint or lightheaded, falls -high blood pressure -irregular heartbeat -fever -muscle cramps or weakness -pain, tingling, numbness in the hands or feet -vomiting Side effects that usually do not require medical attention (report to your doctor or health care professional if they continue or are bothersome): -cough -difficulty sleeping -headache -nervousness or trembling -stomach upset -stuffy or runny nose -throat irritation -unusual taste This list may not describe all possible side effects. Call your doctor for medical advice about side effects. You may report side effects to FDA at 1-800-FDA-1088. Where should I keep my medicine? Keep out of the reach of children. Store at room temperature between 15 and 30 degrees C (59 and 86 degrees F). The contents are under pressure and may burst when exposed to heat or flame. Do not freeze. This medicine does not work as well if it is too cold. Throw away any unused medicine  after the expiration date. Inhalers need to be thrown away after the labeled number of puffs have been used or by the expiration date; whichever comes first. Ventolin HFA should be thrown away 12 months after removing from foil pouch. Check the instructions that come with your medicine. NOTE: This sheet is a summary. It may not cover all possible information. If you have questions about this medicine, talk to your doctor, pharmacist, or health care provider.  2015, [employer]. (2012-11-23 10:57:17)  Metoclopramide tablets What is this medicine? METOCLOPRAMIDE (met oh kloe PRA mide) is used to treat the symptoms of gastroesophageal reflux disease (GERD) like heartburn. It is also used to treat people with slow emptying of the stomach and intestinal tract. This medicine may be used for other purposes; ask your health care provider or pharmacist if you have questions. COMMON BRAND NAME(S): Reglan What should I tell my health care provider before I take this medicine? They need to know if you have any of these conditions: -breast cancer -depression -diabetes -heart failure -high blood pressure -kidney disease -  liver disease -Parkinson's disease or a movement disorder -pheochromocytoma -seizures -stomach obstruction, bleeding, or perforation -an unusual or allergic reaction to metoclopramide, procainamide, sulfites, other medicines, foods, dyes, or preservatives -pregnant or trying to get pregnant -breast-feeding How should I use this medicine? Take this medicine by mouth with a glass of water. Follow the directions on the prescription label. Take this medicine on an empty stomach, about 30 minutes before eating. Take your doses at regular intervals. Do not take your medicine more often than directed. Do not stop taking except on the advice of your doctor or health care professional. A special MedGuide will be given to you by the pharmacist with each prescription and refill. Be  sure to read this information carefully each time. Talk to your pediatrician regarding the use of this medicine in children. Special care may be needed. Overdosage: If you think you have taken too much of this medicine contact a poison control center or emergency room at once. NOTE: This medicine is only for you. Do not share this medicine with others. What if I miss a dose? If you miss a dose, take it as soon as you can. If it is almost time for your next dose, take only that dose. Do not take double or extra doses. What may interact with this medicine? -acetaminophen -cyclosporine -digoxin -medicines for blood pressure -medicines for diabetes, including insulin -medicines for hay fever and other allergies -medicines for depression, especially an Monoamine Oxidase Inhibitor (MAOI) -medicines for Parkinson's disease, like levodopa -medicines for sleep or for pain -tetracycline This list may not describe all possible interactions. Give your health care provider a list of all the medicines, herbs, non-prescription drugs, or dietary supplements you use. Also tell them if you smoke, drink alcohol, or use illegal drugs. Some items may interact with your medicine. What should I watch for while using this medicine? It may take a few weeks for your stomach condition to start to get better. However, do not take this medicine for longer than 12 weeks. The longer you take this medicine, and the more you take it, the greater your chances are of developing serious side effects. If you are an elderly patient, a female patient, or you have diabetes, you may be at an increased risk for side effects from this medicine. Contact your doctor immediately if you start having movements you cannot control such as lip smacking, rapid movements of the tongue, involuntary or uncontrollable movements of the eyes, head, arms and legs, or muscle twitches and spasms. Patients and their families should watch out for worsening  depression or thoughts of suicide. Also watch out for any sudden or severe changes in feelings such as feeling anxious, agitated, panicky, irritable, hostile, aggressive, impulsive, severely restless, overly excited and hyperactive, or not being able to sleep. If this happens, especially at the beginning of treatment or after a change in dose, call your doctor. Do not treat yourself for high fever. Ask your doctor or health care professional for advice. You may get drowsy or dizzy. Do not drive, use machinery, or do anything that needs mental alertness until you know how this drug affects you. Do not stand or sit up quickly, especially if you are an older patient. This reduces the risk of dizzy or fainting spells. Alcohol can make you more drowsy and dizzy. Avoid alcoholic drinks. What side effects may I notice from receiving this medicine? Side effects that you should report to your doctor or health care professional as soon as  possible: -allergic reactions like skin rash, itching or hives, swelling of the face, lips, or tongue -abnormal production of milk in females -breast enlargement in both males and females -change in the way you walk -difficulty moving, speaking or swallowing -drooling, lip smacking, or rapid movements of the tongue -excessive sweating -fever -involuntary or uncontrollable movements of the eyes, head, arms and legs -irregular heartbeat or palpitations -muscle twitches and spasms -unusually weak or tired Side effects that usually do not require medical attention (report to your doctor or health care professional if they continue or are bothersome): -change in sex drive or performance -depressed mood -diarrhea -difficulty sleeping -headache -menstrual changes -restless or nervous This list may not describe all possible side effects. Call your doctor for medical advice about side effects. You may report side effects to FDA at 1-800-FDA-1088. Where should I keep my  medicine? Keep out of the reach of children. Store at room temperature between 20 and 25 degrees C (68 and 77 degrees F). Protect from light. Keep container tightly closed. Throw away any unused medicine after the expiration date. NOTE: This sheet is a summary. It may not cover all possible information. If you have questions about this medicine, talk to your doctor, pharmacist, or health care provider.  2015, [employer]. (2011-10-05 13:04:38)

## 2014-09-20 NOTE — ED Notes (Signed)
Patient transported to X-ray 

## 2015-07-26 ENCOUNTER — Emergency Department (HOSPITAL_COMMUNITY)
Admission: EM | Admit: 2015-07-26 | Discharge: 2015-07-27 | Disposition: A | Discharge status: Home / Self Care | Payer: Self-pay | Attending: Emergency Medicine | Admitting: Emergency Medicine

## 2015-07-26 ENCOUNTER — Encounter (HOSPITAL_COMMUNITY): Payer: Self-pay | Admitting: Emergency Medicine

## 2015-07-26 ENCOUNTER — Emergency Department (HOSPITAL_COMMUNITY): Payer: Self-pay

## 2015-07-26 DIAGNOSIS — F1290 Cannabis use, unspecified, uncomplicated: Secondary | ICD-10-CM | POA: Insufficient documentation

## 2015-07-26 DIAGNOSIS — E010 Iodine-deficiency related diffuse (endemic) goiter: Secondary | ICD-10-CM | POA: Insufficient documentation

## 2015-07-26 DIAGNOSIS — F419 Anxiety disorder, unspecified: Secondary | ICD-10-CM | POA: Insufficient documentation

## 2015-07-26 DIAGNOSIS — Z8781 Personal history of (healed) traumatic fracture: Secondary | ICD-10-CM | POA: Insufficient documentation

## 2015-07-26 DIAGNOSIS — R05 Cough: Secondary | ICD-10-CM

## 2015-07-26 DIAGNOSIS — F17210 Nicotine dependence, cigarettes, uncomplicated: Secondary | ICD-10-CM | POA: Insufficient documentation

## 2015-07-26 DIAGNOSIS — J45901 Unspecified asthma with (acute) exacerbation: Secondary | ICD-10-CM | POA: Insufficient documentation

## 2015-07-26 DIAGNOSIS — R059 Cough, unspecified: Secondary | ICD-10-CM

## 2015-07-26 NOTE — ED Notes (Signed)
Patient transported to X-ray 

## 2015-07-26 NOTE — ED Notes (Signed)
Patient arrived via [facility]. EMS reports: patient reported smoking marijuana at approx 2130. Reported shortness of breath and stabbing pain in L shoulder. Pain 4/10. Pain increased with movement and cough. Also, reports productive cough x 4 days. Denies LOC. BP 110/81, Pulse 82, Resp 18, 100% on room air.

## 2015-07-26 NOTE — Discharge Instructions (Signed)
If you were given medicines take as directed.  If you are on coumadin or contraceptives realize their levels and effectiveness is altered by many different medicines.  If you have any reaction (rash, tongues swelling, other) to the medicines stop taking and see a physician.    If your blood pressure was elevated in the ER make sure you follow up for management with a primary doctor or return for chest pain, shortness of breath or stroke symptoms.  Please follow up as directed and return to the ER or see a physician for new or worsening symptoms.  Thank you. Filed Vitals:   07/26/15 2215 07/26/15 2218 07/26/15 2223 07/26/15 2227  BP: 129/89  129/89   Pulse:   69   Temp:   97.7 F (36.5 C)   TempSrc:   Oral   Resp: 21  18   SpO2:  96% 99% 99%   Data processing manager Guide Outpatient Counseling/Substance Abuse Adult The [employer] 211 is a great source of information about community services available.  Access by dialing 2-1-1 from anywhere in [place], or by website -  CustodianSupply.fi.   Other Local Resources (Updated 06/2015)  [employer]  Crisis Hotline, available 24 hours a day, 7 days a week: [place], [place]   [facility]  Crisis Hotline, available 24 hours a day, 7 days a week: [place], [place]  [facility]  Suicide Prevention Hotline, available 24 hours a day, 7 days a week: [place], [place], available 24 hours a day, 7 days a week: [place], [place] Access to [employer], available 24 hours a day, 7 days a week: (816)367-5445 All   [facility]  Crisis Hotline, available 24 hours a day, 7 days a week: (580)337-2276 All   Other Local Resources (Updated 06/2015)  Outpatient Counseling/ Substance Abuse Programs  Services     Address and Phone  Number  ADS (Alcohol and Drug Services)   Options include Individual counseling, group counseling, intensive outpatient program (several hours a day, several days a week)  Offers depression assessments  Provides methadone maintenance program [phone] [address], [place], [place] partial hospitalization/day treatment and DUI/DWI programs  [employer], private insurance [phone] [address], [phone] [place], [place] [address]  [facility] include intensive outpatient program (several hours a day, several days a week), outpatient treatment, DUI/DWI services, family education  Also has some services specifically for [employer] transitional housing  [phone] [address] [place], [place] [address]     [place] Medicare, private pay, and private insurance [phone] [address], [place], [place] [address]  [facility]  Services include individual counseling, substance abuse intensive outpatient program (several hours a day, several days a week), day treatment  Blinda Leatherwood, Medicaid, private insurance [phone] [address], [place], [place] [address]  [facility]   Offers substance abuse intensive outpatient program (several hours a day, several days a week), partial hospitalization program [phone] [address] [place], [place] [address]  [phone] [address] [place], [place] [address]  [phone] [place],  [address]  [phone] [address], [address]  [place], [place] [address]  [facility]  Individual counseling only  Accepts private insurance only [phone] [address], [place], [place] [address]  [facility]  Methadone maintenance program  [phone] [address] [place], [place] [address]  [facility] providing substance abuse and mental health counseling  Accepts Medicaid, Medicare, private insurance  Offers sliding scale for uninsured [phone] [place], [place] in [place] individual counseling, and intensive in-home services [phone] [address] [place], [place] [address]  [facility] individual counseling, family counseling, group therapy, domestic violence counseling, consumer credit counseling  Accepts Medicare, Medicaid, private insurance  Offers sliding scale for uninsured [phone] [address] [place], [place] [address]  [phone] [facility], [address] [place], [place]  [facility]  Offers individual, family and group counseling  3 locations - [place], [place], and [place]  [address] [place], [place] [address]  [address] [place], [place] [address]  [place], [place] [address]  [facility]    Offers psychiatric assessment, 8-week Intensive Outpatient Program (several hours a day, several times a week, daytime or evenings), early recovery group, family Program, medication management  Private pay or private insurance only [phone], or  [phone] [address] [place], [place] [address]  [facility]  Offers individual, couples and family counseling  Accepts Medicaid, private insurance, and sliding scale for uninsured [phone] [address], [place] [address]  Launa Flight, MD  Individual counseling  Private insurance [phone] [place], [place] [address]  [facility]   Offers assessment, substance abuse treatment, and behavioral health treatment [phone] [place], [place] [address]  [place]  Individual counseling  Accepts private insurance [phone] [place], [place] [address]  [facility]  Individual counseling  Blinda Leatherwood, private insurance [phone] [place], [place] [address]  [facility]    Offers intensive outpatient program (several hours a day, several times a week)  Private pay, private insurance [phone] [place], [place]  Individual counseling  Medicare, private insurance [phone] [address] [place], [place] [address]  [facility]    Offers intensive outpatient program (several hours a day, several times a week) and partial hospitalization program [phone] [place], [place] [address]  Letta Moynahan, MD  Individual counseling [phone] [address] [place], [place] [address]  [place] counseling to individuals, couples, and families  Accepts Medicare and private insurance; offers sliding scale for uninsured [phone] [place], [place] [address]  [facility]  Christian counseling [phone] [address] [place], [place] [address]  [facility] [employer] crisis counseling, individual counseling, group therapy, in-home therapy, domestic violence services, day treatment, DWI services, Conservation officer, nature (CST), Assertive Community Treatment Team (ACTT), substance abuse Intensive Outpatient Program (several hours a day, several times a week)  2 locations - [place] and [place],  [address]  [phone] [address] [place],  [address]  [facility] and group therapy  [place] insurance, [place], [place] [phone] [address] [place], [place]  [facility]  Offers individual and family counseling  Offers LGBTQ services  Accepts private insurance and private pay [phone] [place], [place] [address]  [facility]    Offers individual counseling, group therapy, and outpatient detox  Accepts private insurance [phone] [place], [place] Medicare, private insurance [phone] [address] [place], [place] [address]  [employer]  Individual counseling  Accepts Medicare, private insurance [phone] [address] [place] [address], [place] [address]  [facility] substance abuse Intensive Outpatient Program (several hours a day, several times a week) [phone], or [phone] [place], [place]

## 2015-07-26 NOTE — ED Notes (Signed)
Pt ask for something to eat. Since pt is up for discharge ok per RN Horris Latino. Pt given a [place] sandwich and apple sauce. Pt has a bottled fruit drink with her in room. She declined anything else to drink.

## 2015-07-31 NOTE — ED Provider Notes (Signed)
CSN: [id number]     Arrival date & time 07/26/15  2205 History   First MD Initiated Contact with Patient 07/26/15 2215     Chief Complaint  Patient presents with  . Shortness of Breath  . Cough     (Consider location/radiation/quality/duration/timing/severity/associated sxs/prior Treatment) HPI Comments: 40 yo female with smoking hx presents with cough and anxiety since smoking marijuana at 2130 today.  Pt denies other drugs.  Pt has had productive cough for 4 days. No sick contacts, no travel.  No fevers.  No cardiac or blood clot hx, no recent surgery or leg swelling.    Patient is a 40 y.o. female presenting with shortness of breath and cough. The history is provided by the patient.  Shortness of Breath Associated symptoms: cough   Associated symptoms: no abdominal pain, no chest pain, no fever, no headaches, no neck pain, no rash and no vomiting   Cough Associated symptoms: shortness of breath   Associated symptoms: no chest pain, no chills, no fever, no headaches and no rash     Past Medical History  Diagnosis Date  . History of bronchitis   . Fracture, humerus closed, shaft 07/11/2014    left  . Asthma    Past Surgical History  Procedure Laterality Date  . Cesarean section    . Ectopic pregnancy surgery    . Lipoma excision      chest  . Tubal ligation    . Orif humerus fracture Left 07/15/2014    Procedure: OPEN REDUCTION INTERNAL FIXATION (ORIF) LEFT HUMERAL SHAFT ;  Surgeon: Renette Butters, MD;  Location: [facility];  Service: Orthopedics;  Laterality: Left;   No family history on file. Social History  Substance Use Topics  . Smoking status: Current Every Day Smoker -- 18 years    Types: Cigarettes  . Smokeless tobacco: Never Used     Comment: 6 cig./day  . Alcohol Use: Yes     Comment: occasionally   OB History    No data available     Review of Systems  Constitutional: Negative for fever and chills.  HENT: Negative for congestion.    Eyes: Negative for visual disturbance.  Respiratory: Positive for cough and shortness of breath.   Cardiovascular: Negative for chest pain and leg swelling.  Gastrointestinal: Negative for vomiting and abdominal pain.  Genitourinary: Negative for dysuria and flank pain.  Musculoskeletal: Negative for back pain, neck pain and neck stiffness.  Skin: Negative for rash.  Neurological: Negative for light-headedness and headaches.  Psychiatric/Behavioral: The patient is nervous/anxious.       Allergies  Review of patient's allergies indicates no known allergies.  Home Medications   Prior to Admission medications   Not on File   BP 121/94 mmHg  Pulse 86  Temp(Src) 97.7 F (36.5 C) (Oral)  Resp 18  SpO2 100%  LMP 07/20/2015 (Approximate) Physical Exam  Constitutional: She is oriented to person, place, and time. She appears well-developed and well-nourished.  HENT:  Head: Normocephalic and atraumatic.  Eyes: Conjunctivae are normal. Right eye exhibits no discharge. Left eye exhibits no discharge.  Neck: Normal range of motion. Neck supple. No tracheal deviation present. Thyromegaly: congested.  Cardiovascular: Normal rate and regular rhythm.   Pulmonary/Chest: Effort normal and breath sounds normal.  Abdominal: Soft. She exhibits no distension. There is no tenderness. There is no guarding.  Musculoskeletal: She exhibits no edema.  Neurological: She is alert and oriented to person, place, and time.  Skin:  Skin is warm. No rash noted.  Psychiatric: She has a normal mood and affect.  Nursing note and vitals reviewed.   ED Course  Procedures (including critical care time) Labs Review Labs Reviewed - No data to display  Imaging Review No results found. I have personally reviewed and evaluated these images and lab results as part of my medical decision-making. Dg Chest 2 View  07/26/2015  CLINICAL DATA:  Cough EXAM: CHEST  2 VIEW COMPARISON:  09/20/2014 FINDINGS: The heart size  and mediastinal contours are within normal limits. Both lungs are clear. The visualized skeletal structures are unremarkable. IMPRESSION: No active cardiopulmonary disease. Electronically Signed   By: Franchot Gallo M.D.   On: 07/26/2015 23:07     EKG Interpretation   Date/Time:  Saturday July 26 2015 22:12:54 EST Ventricular Rate:  78 PR Interval:  150 QRS Duration: 100 QT Interval:  417 QTC Calculation: 475 R Axis:   5 Text Interpretation:  Sinus rhythm Low voltage, precordial leads Confirmed  by Jerric Oyen  MD, Lorretta Kerce ([id number]) on 07/26/2015 11:05:36 PM      MDM   Final diagnoses:  Marijuana use  Cough   Pt presents with feeling anxious and worsening cough since smoking marijuana.  Pt well appearing in ED, mild anxious and says she is done smoking marijuana.  Vitals normal. Screening xray and ekg no acute findings.  Pt stable for outpt fup.  Results and differential diagnosis were discussed with the patient/parent/guardian. Xrays were independently reviewed by myself.  Close follow up outpatient was discussed, comfortable with the plan.   Medications - No data to display  Filed Vitals:   07/26/15 2218 07/26/15 2223 07/26/15 2227 07/27/15 0008  BP:  129/89  121/94  Pulse:  69  86  Temp:  97.7 F (36.5 C)    TempSrc:  Oral    Resp:  18    SpO2: 96% 99% 99% 100%    Final diagnoses:  Marijuana use  Cough       Elnora Morrison, MD 07/31/15 [phone]

## 2015-11-08 ENCOUNTER — Encounter (HOSPITAL_COMMUNITY): Payer: Self-pay | Admitting: Emergency Medicine

## 2015-11-08 ENCOUNTER — Emergency Department (HOSPITAL_COMMUNITY)
Admission: EM | Admit: 2015-11-08 | Discharge: 2015-11-08 | Disposition: A | Discharge status: Home / Self Care | Payer: No Typology Code available for payment source | Attending: Emergency Medicine | Admitting: Emergency Medicine

## 2015-11-08 DIAGNOSIS — Y939 Activity, unspecified: Secondary | ICD-10-CM | POA: Insufficient documentation

## 2015-11-08 DIAGNOSIS — Y999 Unspecified external cause status: Secondary | ICD-10-CM | POA: Insufficient documentation

## 2015-11-08 DIAGNOSIS — M545 Low back pain: Secondary | ICD-10-CM | POA: Diagnosis present

## 2015-11-08 DIAGNOSIS — J45909 Unspecified asthma, uncomplicated: Secondary | ICD-10-CM | POA: Diagnosis not present

## 2015-11-08 DIAGNOSIS — F17210 Nicotine dependence, cigarettes, uncomplicated: Secondary | ICD-10-CM | POA: Insufficient documentation

## 2015-11-08 DIAGNOSIS — Y92410 Unspecified street and highway as the place of occurrence of the external cause: Secondary | ICD-10-CM | POA: Insufficient documentation

## 2015-11-08 LAB — POC URINE PREG, ED: PREG TEST UR: NEGATIVE

## 2015-11-08 MED ORDER — METHOCARBAMOL 500 MG PO TABS: 500.0000 mg | ORAL_TABLET | Freq: Two times a day (BID) | ORAL | Status: DC
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
## 2015-11-08 NOTE — ED Notes (Signed)
Patient has stated that she may be pregnant.

## 2015-12-24 ENCOUNTER — Encounter (HOSPITAL_COMMUNITY): Payer: Self-pay

## 2015-12-24 ENCOUNTER — Emergency Department (HOSPITAL_COMMUNITY): Payer: Medicaid Other

## 2015-12-24 ENCOUNTER — Observation Stay (HOSPITAL_COMMUNITY)
Admission: EM | Admit: 2015-12-24 | Discharge: 2015-12-25 | Disposition: A | Discharge status: Home / Self Care | Payer: Medicaid Other | Attending: Orthopaedic Surgery | Admitting: Orthopaedic Surgery

## 2015-12-24 DIAGNOSIS — Z9889 Other specified postprocedural states: Secondary | ICD-10-CM

## 2015-12-24 DIAGNOSIS — Z23 Encounter for immunization: Secondary | ICD-10-CM | POA: Diagnosis not present

## 2015-12-24 DIAGNOSIS — S92502B Displaced unspecified fracture of left lesser toe(s), initial encounter for open fracture: Secondary | ICD-10-CM | POA: Diagnosis not present

## 2015-12-24 DIAGNOSIS — S92402B Displaced unspecified fracture of left great toe, initial encounter for open fracture: Secondary | ICD-10-CM | POA: Diagnosis present

## 2015-12-24 DIAGNOSIS — F17210 Nicotine dependence, cigarettes, uncomplicated: Secondary | ICD-10-CM | POA: Diagnosis not present

## 2015-12-24 DIAGNOSIS — S92422B Displaced fracture of distal phalanx of left great toe, initial encounter for open fracture: Secondary | ICD-10-CM | POA: Diagnosis present

## 2015-12-24 DIAGNOSIS — W28XXXA Contact with powered lawn mower, initial encounter: Secondary | ICD-10-CM | POA: Diagnosis not present

## 2015-12-24 DIAGNOSIS — Z8781 Personal history of (healed) traumatic fracture: Secondary | ICD-10-CM

## 2015-12-24 DIAGNOSIS — S92912B Unspecified fracture of left toe(s), initial encounter for open fracture: Secondary | ICD-10-CM

## 2015-12-24 DIAGNOSIS — Z6841 Body Mass Index (BMI) 40.0 and over, adult: Secondary | ICD-10-CM | POA: Diagnosis not present

## 2015-12-24 MED ORDER — OXYCODONE-ACETAMINOPHEN 5-325 MG PO TABS
1.0000 | ORAL_TABLET | ORAL | Status: AC | PRN
  Administered 2015-12-24 – 2015-12-25 (×2): 1 via ORAL
  Filled 2015-12-24: qty 1

## 2015-12-24 MED ORDER — MORPHINE SULFATE (PF) 4 MG/ML IV SOLN
4.0000 mg | Freq: Once | INTRAVENOUS | Status: AC
  Administered 2015-12-24: 4 mg via INTRAVENOUS
  Filled 2015-12-24: qty 1

## 2015-12-24 MED ORDER — TETANUS-DIPHTH-ACELL PERTUSSIS 5-2.5-18.5 LF-MCG/0.5 IM SUSP
0.5000 mL | Freq: Once | INTRAMUSCULAR | Status: AC
  Administered 2015-12-24: 0.5 mL via INTRAMUSCULAR
  Filled 2015-12-24: qty 0.5

## 2015-12-24 NOTE — ED Provider Notes (Signed)
CSN: [id number]     Arrival date & time 12/24/15  2156 History  By signing my name below, I, Randa Evens, attest that this documentation has been prepared under the direction and in the presence of [employer], PA-C. Electronically Signed: Randa Evens, ED Scribe. 12/24/2015. 11:36 PM.     Chief Complaint  Patient presents with  . Toe Injury   The history is provided by the patient. No language interpreter was used.   HPI Comments: Julie [name] is a 40 y.o. female brought in by ambulance, who presents to the Emergency Department complaining of left toe injury onset today 2 hours PTA. Pt state that she was mowing her lawn slipped and left 1st and 2nd toe went under the mower. Pt presents with lacerations to the 1st and 2nd toe. Bleeding is controlled with bandage. Per Ems pt has had fentanyl PTA. Pt denies any treatments PTA. Pt denies numbness, tingling, knee pain or ankle pain.     Past Medical History  Diagnosis Date  . History of bronchitis   . Fracture, humerus closed, shaft 07/11/2014    left  . Asthma    Past Surgical History  Procedure Laterality Date  . Cesarean section    . Ectopic pregnancy surgery    . Lipoma excision      chest  . Tubal ligation    . Orif humerus fracture Left 07/15/2014    Procedure: OPEN REDUCTION INTERNAL FIXATION (ORIF) LEFT HUMERAL SHAFT ;  Surgeon: Renette Butters, MD;  Location: [facility];  Service: Orthopedics;  Laterality: Left;   History reviewed. No pertinent family history. Social History  Substance Use Topics  . Smoking status: Current Every Day Smoker -- 18 years    Types: Cigarettes  . Smokeless tobacco: Never Used     Comment: 6 cig./day  . Alcohol Use: Yes     Comment: occasionally   OB History    No data available     Review of Systems  Skin: Positive for wound.   A complete 10 system review of systems was obtained and all systems are negative except as noted in the HPI and PMH.      Allergies  Review of patient's allergies indicates no known allergies.  Home Medications   Prior to Admission medications   Medication Sig Start Date End Date Taking? Authorizing Provider  methocarbamol (ROBAXIN) 500 MG tablet Take 1 tablet (500 mg total) by mouth 2 (two) times daily. Patient not taking: Reported on 12/24/2015 11/08/15   Lacretia Leigh, MD   BP 117/79 mmHg  Pulse 52  Temp(Src) 98.2 F (36.8 C) (Oral)  Resp 17  SpO2 95%  LMP 12/09/2015 (Approximate)   Physical Exam  Constitutional: She is oriented to person, place, and time. She appears well-developed and well-nourished. No distress.  HENT:  Head: Normocephalic and atraumatic.  Eyes: Conjunctivae and EOM are normal.  Neck: Neck supple. No tracheal deviation present.  Cardiovascular: Normal rate.   Pulses:      Dorsalis pedis pulses are 2+ on the left side.       Posterior tibial pulses are 2+ on the left side.  Pulmonary/Chest: Effort normal. No respiratory distress.  Abdominal: Soft.  Musculoskeletal: Normal range of motion.  Left foot: No ankle pain, right great toe open fracture  Neurological: She is alert and oriented to person, place, and time.  Skin: Skin is warm and dry.  Psychiatric: She has a normal mood and affect. Her behavior is normal.  Nursing note and vitals reviewed.       ED Course  Procedures (including critical care time) DIAGNOSTIC STUDIES: Oxygen Saturation is 98% on RA, normal by my interpretation.    COORDINATION OF CARE: 11:42 PM-Discussed treatment plan with pt at bedside and pt agreed to plan.     Labs Review Labs Reviewed - No data to display  Imaging Review Dg Foot Complete Left  12/24/2015  CLINICAL DATA:  Slipped and fell while mowing lawn, with lacerations to the left toes. Initial encounter. EXAM: LEFT FOOT - COMPLETE 3+ VIEW COMPARISON:  None. FINDINGS: There is absence of part of the distal aspect of the first distal phalanx, and a comminuted fracture  involving the second distal phalanx, with associated soft tissue disruption. Visualized joint spaces are grossly preserved. Mild soft tissue swelling is noted along the plantar aspect of the forefoot. The subtalar joint is unremarkable in appearance. Small plantar and posterior calcaneal spurs are seen. IMPRESSION: Absence of part of the distal aspect of the first distal phalanx, and comminuted fracture involving the second distal phalanx, with associated soft tissue disruption. Electronically Signed   By: Garald Balding M.D.   On: 12/24/2015 22:47   I have personally reviewed and evaluated these images and lab results as part of my medical decision-making.   EKG Interpretation None      MDM  Patient presents today sustaining laceration and open fracture to left great toe secondary to getting her foot caught and lawnmower blade. Tetanus updated in ED. She is neurovascularly intact, distal pulses intact. X-ray confirms comminuted fracture of the second distal phalanx on left foot, absence of part of the distal aspect of first distal phalanx. Plan to consult orthopedic surgery. Discussed with Dr. Erlinda Hong, we will initiate empiric antibiotics in the emergency department. Plan to go to the OR for washout. Final diagnoses:  Open toe fracture, left, initial encounter      I personally performed the services described in this documentation, which was scribed in my presence. The recorded information has been reviewed and is accurate.      Comer Locket, PA-C 12/25/15 [id number]  Wandra Arthurs, MD 12/25/15 [phone]

## 2015-12-24 NOTE — ED Notes (Signed)
Pt was mowing her lawn on a hill and slipped and her left big toe and the next toe went under the mower and they have lacerations to both, wrapped by EMS, received 220mcg of fentanyl in route, 20 gauge in left hand

## 2015-12-25 ENCOUNTER — Emergency Department (HOSPITAL_COMMUNITY): Payer: Medicaid Other | Admitting: Anesthesiology

## 2015-12-25 ENCOUNTER — Encounter (HOSPITAL_COMMUNITY)
Admission: EM | Disposition: A | Discharge status: Home / Self Care | Payer: Self-pay | Source: Home / Self Care | Attending: Emergency Medicine

## 2015-12-25 ENCOUNTER — Encounter (HOSPITAL_COMMUNITY): Payer: Self-pay | Admitting: Orthopaedic Surgery

## 2015-12-25 DIAGNOSIS — S92402B Displaced unspecified fracture of left great toe, initial encounter for open fracture: Secondary | ICD-10-CM

## 2015-12-25 DIAGNOSIS — S92422B Displaced fracture of distal phalanx of left great toe, initial encounter for open fracture: Secondary | ICD-10-CM | POA: Diagnosis not present

## 2015-12-25 DIAGNOSIS — F17210 Nicotine dependence, cigarettes, uncomplicated: Secondary | ICD-10-CM | POA: Diagnosis not present

## 2015-12-25 DIAGNOSIS — S92502B Displaced unspecified fracture of left lesser toe(s), initial encounter for open fracture: Secondary | ICD-10-CM | POA: Diagnosis not present

## 2015-12-25 DIAGNOSIS — Z8781 Personal history of (healed) traumatic fracture: Secondary | ICD-10-CM

## 2015-12-25 DIAGNOSIS — Z9889 Other specified postprocedural states: Secondary | ICD-10-CM

## 2015-12-25 HISTORY — DX: Displaced unspecified fracture of left great toe, initial encounter for open fracture: S92.402B

## 2015-12-25 HISTORY — PX: INCISION AND DRAINAGE: SHX5863

## 2015-12-25 SURGERY — INCISION AND DRAINAGE
Anesthesia: General | Site: Foot | Laterality: Left

## 2015-12-25 MED ORDER — MIDAZOLAM HCL 5 MG/5ML IJ SOLN
INTRAMUSCULAR | Status: DC | PRN
  Administered 2015-12-25: 2 mg via INTRAVENOUS

## 2015-12-25 MED ORDER — PROPOFOL 10 MG/ML IV BOLUS
INTRAVENOUS | Status: DC | PRN
  Administered 2015-12-25: 300 mg via INTRAVENOUS
  Administered 2015-12-25: 30 mg via INTRAVENOUS

## 2015-12-25 MED ORDER — OXYCODONE-ACETAMINOPHEN 5-325 MG PO TABS: 1.0000 | ORAL_TABLET | ORAL | Status: DC | PRN

## 2015-12-25 MED ORDER — METHOCARBAMOL 500 MG PO TABS
500.0000 mg | ORAL_TABLET | Freq: Four times a day (QID) | ORAL | Status: DC | PRN
  Administered 2015-12-25: 500 mg via ORAL
  Filled 2015-12-25: qty 1

## 2015-12-25 MED ORDER — SODIUM CHLORIDE 0.9 % IR SOLN
Status: DC | PRN
  Administered 2015-12-25: 6000 mL

## 2015-12-25 MED ORDER — DEXAMETHASONE SODIUM PHOSPHATE 10 MG/ML IJ SOLN
INTRAMUSCULAR | Status: AC
  Filled 2015-12-25: qty 1

## 2015-12-25 MED ORDER — FENTANYL CITRATE (PF) 100 MCG/2ML IJ SOLN
25.0000 ug | INTRAMUSCULAR | Status: DC | PRN
  Administered 2015-12-25 (×3): 50 ug via INTRAVENOUS

## 2015-12-25 MED ORDER — ACETAMINOPHEN 325 MG PO TABS
650.0000 mg | ORAL_TABLET | Freq: Four times a day (QID) | ORAL | Status: DC | PRN
  Administered 2015-12-25: 650 mg via ORAL
  Filled 2015-12-25: qty 2

## 2015-12-25 MED ORDER — POLYETHYLENE GLYCOL 3350 17 G PO PACK: 17.0000 g | PACK | Freq: Every day | ORAL | Status: DC | PRN

## 2015-12-25 MED ORDER — SUCCINYLCHOLINE CHLORIDE 20 MG/ML IJ SOLN
INTRAMUSCULAR | Status: DC | PRN
  Administered 2015-12-25: 140 mg via INTRAVENOUS

## 2015-12-25 MED ORDER — BACITRACIN ZINC 500 UNIT/GM EX OINT
TOPICAL_OINTMENT | CUTANEOUS | Status: DC | PRN
  Administered 2015-12-25: 1 via TOPICAL

## 2015-12-25 MED ORDER — SODIUM CHLORIDE 0.9 % IV SOLN
INTRAVENOUS | Status: DC
  Administered 2015-12-25: 05:00:00 via INTRAVENOUS

## 2015-12-25 MED ORDER — PROPOFOL 10 MG/ML IV BOLUS
INTRAVENOUS | Status: AC
  Filled 2015-12-25: qty 40

## 2015-12-25 MED ORDER — FENTANYL CITRATE (PF) 100 MCG/2ML IJ SOLN
INTRAMUSCULAR | Status: DC | PRN
  Administered 2015-12-25 (×3): 50 ug via INTRAVENOUS

## 2015-12-25 MED ORDER — OXYCODONE-ACETAMINOPHEN 5-325 MG PO TABS
ORAL_TABLET | ORAL | Status: AC
  Filled 2015-12-25: qty 1

## 2015-12-25 MED ORDER — MIDAZOLAM HCL 2 MG/2ML IJ SOLN
INTRAMUSCULAR | Status: AC
  Filled 2015-12-25: qty 2

## 2015-12-25 MED ORDER — CEFAZOLIN SODIUM 10 G IJ SOLR
3.0000 g | Freq: Once | INTRAMUSCULAR | Status: AC
  Administered 2015-12-25: 3 g via INTRAVENOUS
  Filled 2015-12-25: qty 3

## 2015-12-25 MED ORDER — FENTANYL CITRATE (PF) 100 MCG/2ML IJ SOLN
INTRAMUSCULAR | Status: AC
  Filled 2015-12-25: qty 2

## 2015-12-25 MED ORDER — LACTATED RINGERS IV SOLN
INTRAVENOUS | Status: DC | PRN
  Administered 2015-12-25: 02:00:00 via INTRAVENOUS

## 2015-12-25 MED ORDER — METOCLOPRAMIDE HCL 10 MG PO TABS: 5.0000 mg | ORAL_TABLET | Freq: Three times a day (TID) | ORAL | Status: DC | PRN

## 2015-12-25 MED ORDER — CIPROFLOXACIN HCL 500 MG PO TABS: 500.0000 mg | ORAL_TABLET | Freq: Two times a day (BID) | ORAL | Status: DC

## 2015-12-25 MED ORDER — ONDANSETRON HCL 4 MG/2ML IJ SOLN: 4.0000 mg | Freq: Four times a day (QID) | INTRAMUSCULAR | Status: DC | PRN

## 2015-12-25 MED ORDER — LIDOCAINE HCL (CARDIAC) 20 MG/ML IV SOLN
INTRAVENOUS | Status: DC | PRN
  Administered 2015-12-25: 100 mg via INTRAVENOUS

## 2015-12-25 MED ORDER — LIDOCAINE HCL (CARDIAC) 20 MG/ML IV SOLN
INTRAVENOUS | Status: AC
  Filled 2015-12-25: qty 5

## 2015-12-25 MED ORDER — DIPHENHYDRAMINE HCL 12.5 MG/5ML PO ELIX: 25.0000 mg | ORAL_SOLUTION | ORAL | Status: DC | PRN

## 2015-12-25 MED ORDER — METOCLOPRAMIDE HCL 5 MG/ML IJ SOLN: 5.0000 mg | Freq: Three times a day (TID) | INTRAMUSCULAR | Status: DC | PRN

## 2015-12-25 MED ORDER — ONDANSETRON HCL 4 MG/2ML IJ SOLN
INTRAMUSCULAR | Status: AC
  Filled 2015-12-25: qty 2

## 2015-12-25 MED ORDER — SORBITOL 70 % SOLN
30.0000 mL | Freq: Every day | Status: DC | PRN
  Filled 2015-12-25: qty 30

## 2015-12-25 MED ORDER — SODIUM CHLORIDE 0.9 % IV BOLUS (SEPSIS)
1000.0000 mL | Freq: Once | INTRAVENOUS | Status: AC
  Administered 2015-12-25: 1000 mL via INTRAVENOUS

## 2015-12-25 MED ORDER — BACITRACIN ZINC 500 UNIT/GM EX OINT
TOPICAL_OINTMENT | CUTANEOUS | Status: AC
  Filled 2015-12-25: qty 28.35

## 2015-12-25 MED ORDER — OXYCODONE HCL 5 MG PO TABS
5.0000 mg | ORAL_TABLET | ORAL | Status: DC | PRN
  Administered 2015-12-25 (×3): 10 mg via ORAL
  Filled 2015-12-25 (×3): qty 2

## 2015-12-25 MED ORDER — DEXAMETHASONE SODIUM PHOSPHATE 10 MG/ML IJ SOLN
INTRAMUSCULAR | Status: DC | PRN
  Administered 2015-12-25: 10 mg via INTRAVENOUS

## 2015-12-25 MED ORDER — CEFAZOLIN SODIUM-DEXTROSE 2-4 GM/100ML-% IV SOLN
2.0000 g | Freq: Four times a day (QID) | INTRAVENOUS | Status: DC
  Administered 2015-12-25: 2 g via INTRAVENOUS
  Filled 2015-12-25 (×3): qty 100

## 2015-12-25 MED ORDER — ONDANSETRON HCL 4 MG PO TABS: 4.0000 mg | ORAL_TABLET | Freq: Four times a day (QID) | ORAL | Status: DC | PRN

## 2015-12-25 MED ORDER — ACETAMINOPHEN 650 MG RE SUPP: 650.0000 mg | Freq: Four times a day (QID) | RECTAL | Status: DC | PRN

## 2015-12-25 MED ORDER — GENTAMICIN SULFATE 40 MG/ML IJ SOLN
7.0000 mg/kg | INTRAMUSCULAR | Status: DC
  Administered 2015-12-25: 700 mg via INTRAVENOUS
  Filled 2015-12-25: qty 17.5

## 2015-12-25 MED ORDER — MAGNESIUM CITRATE PO SOLN: 1.0000 | Freq: Once | ORAL | Status: DC | PRN

## 2015-12-25 MED ORDER — LACTATED RINGERS IV SOLN
INTRAVENOUS | Status: DC
  Administered 2015-12-25: 1000 mL via INTRAVENOUS

## 2015-12-25 MED ORDER — FENTANYL CITRATE (PF) 250 MCG/5ML IJ SOLN
INTRAMUSCULAR | Status: AC
  Filled 2015-12-25: qty 5

## 2015-12-25 MED ORDER — PENICILLIN G BENZATHINE 1200000 UNIT/2ML IM SUSP
2.4000 10*6.[IU] | Freq: Once | INTRAMUSCULAR | Status: AC
  Administered 2015-12-25: 1.2 10*6.[IU] via INTRAMUSCULAR
  Filled 2015-12-25: qty 4

## 2015-12-25 MED ORDER — ISOPROPYL ALCOHOL 70 % SOLN
Status: AC
  Filled 2015-12-25: qty 480

## 2015-12-25 MED ORDER — ONDANSETRON HCL 4 MG/2ML IJ SOLN
INTRAMUSCULAR | Status: DC | PRN
  Administered 2015-12-25: 4 mg via INTRAVENOUS

## 2015-12-25 MED ORDER — MORPHINE SULFATE (PF) 2 MG/ML IV SOLN
1.0000 mg | INTRAVENOUS | Status: DC | PRN
Start: 2015-12-25 — End: 2015-12-25

## 2015-12-25 MED ORDER — METHOCARBAMOL 1000 MG/10ML IJ SOLN
500.0000 mg | Freq: Four times a day (QID) | INTRAVENOUS | Status: DC | PRN
  Filled 2015-12-25: qty 5

## 2015-12-25 SURGICAL SUPPLY — 50 items
BAG ZIPLOCK 12X15 (MISCELLANEOUS) ×3 IMPLANT
BANDAGE ACE 6X5 VEL STRL LF (GAUZE/BANDAGES/DRESSINGS) IMPLANT
BNDG COHESIVE 4X5 TAN STRL (GAUZE/BANDAGES/DRESSINGS) ×6 IMPLANT
BNDG CONFORM 3 STRL LF (GAUZE/BANDAGES/DRESSINGS) IMPLANT
BNDG CONFORM 4 STRL LF (GAUZE/BANDAGES/DRESSINGS) ×6 IMPLANT
BNDG ESMARK 4X9 LF (GAUZE/BANDAGES/DRESSINGS) IMPLANT
CLOSURE WOUND 1/2 X4 (GAUZE/BANDAGES/DRESSINGS)
CUFF TOURN SGL QUICK 34 (TOURNIQUET CUFF)
CUFF TRNQT CYL 34X4X40X1 (TOURNIQUET CUFF) IMPLANT
DECANTER SPIKE VIAL GLASS SM (MISCELLANEOUS) IMPLANT
DRAPE C-ARM 42X120 X-RAY (DRAPES) IMPLANT
DRAPE OEC MINIVIEW 54X84 (DRAPES) IMPLANT
DRAPE SHEET LG 3/4 BI-LAMINATE (DRAPES) IMPLANT
DRAPE U-SHAPE 47X51 STRL (DRAPES) ×3 IMPLANT
DRSG ADAPTIC 3X8 NADH LF (GAUZE/BANDAGES/DRESSINGS) IMPLANT
DRSG EMULSION OIL 3X16 NADH (GAUZE/BANDAGES/DRESSINGS) ×6 IMPLANT
DRSG PAD ABDOMINAL 8X10 ST (GAUZE/BANDAGES/DRESSINGS) IMPLANT
DURAPREP 26ML APPLICATOR (WOUND CARE) IMPLANT
ELECT REM PT RETURN 9FT ADLT (ELECTROSURGICAL) ×3
ELECTRODE REM PT RTRN 9FT ADLT (ELECTROSURGICAL) ×1 IMPLANT
FACESHIELD WRAPAROUND (MASK) IMPLANT
GAUZE IODOFORM PACK 1/2 7832 (GAUZE/BANDAGES/DRESSINGS) IMPLANT
GAUZE PACKING IODOFORM 1X5 (MISCELLANEOUS) IMPLANT
GAUZE SPONGE 4X4 12PLY STRL (GAUZE/BANDAGES/DRESSINGS) ×6 IMPLANT
GLOVE BIOGEL PI IND STRL 8 (GLOVE) ×1 IMPLANT
GLOVE BIOGEL PI INDICATOR 8 (GLOVE) ×2
GLOVE ECLIPSE 8.0 STRL XLNG CF (GLOVE) ×6 IMPLANT
GLOVE ORTHO TXT STRL SZ7.5 (GLOVE) ×3 IMPLANT
KIT BASIN OR (CUSTOM PROCEDURE TRAY) IMPLANT
NEEDLE HYPO 22GX1.5 SAFETY (NEEDLE) IMPLANT
NEEDLE MAYO CATGUT SZ4 (NEEDLE) IMPLANT
NS IRRIG 1000ML POUR BTL (IV SOLUTION) ×3 IMPLANT
PACK ORTHO EXTREMITY (CUSTOM PROCEDURE TRAY) ×3 IMPLANT
PAD CAST 4YDX4 CTTN HI CHSV (CAST SUPPLIES) IMPLANT
PADDING CAST COTTON 4X4 STRL (CAST SUPPLIES)
POSITIONER SURGICAL ARM (MISCELLANEOUS) IMPLANT
SPONGE LAP 4X18 X RAY DECT (DISPOSABLE) IMPLANT
STAPLER VISISTAT 35W (STAPLE) IMPLANT
STRIP CLOSURE SKIN 1/2X4 (GAUZE/BANDAGES/DRESSINGS) IMPLANT
SUT ETHILON 2 0 PS N (SUTURE) IMPLANT
SUT NYLON 3 0 (SUTURE) ×6 IMPLANT
SUT PROLENE 3 0 PS 2 (SUTURE) IMPLANT
SUT VIC AB 2-0 CT1 27 (SUTURE)
SUT VIC AB 2-0 CT1 TAPERPNT 27 (SUTURE) IMPLANT
SUT VIC AB 3-0 PS2 18 (SUTURE)
SUT VIC AB 3-0 PS2 18XBRD (SUTURE) IMPLANT
SWAB COLLECTION DEVICE MRSA (MISCELLANEOUS) IMPLANT
SWAB CULTURE ESWAB REG 1ML (MISCELLANEOUS) IMPLANT
SYR CONTROL 10ML LL (SYRINGE) ×3 IMPLANT
WATER STERILE IRR 1500ML POUR (IV SOLUTION) IMPLANT

## 2015-12-25 NOTE — Anesthesia Preprocedure Evaluation (Addendum)
Anesthesia Evaluation  Patient identified by MRN, date of birth, ID band Patient awake    Reviewed: Allergy & Precautions, H&P , NPO status , Patient's Chart, lab work & pertinent test results  Airway Mallampati: I  TM Distance: >3 FB Neck ROM: Full    Dental no notable dental hx. (+) Dental Advisory Given, Teeth Intact   Pulmonary asthma , Current Smoker,    Pulmonary exam normal breath sounds clear to auscultation       Cardiovascular Exercise Tolerance: Good negative cardio ROS Normal cardiovascular exam Rhythm:regular Rate:Normal     Neuro/Psych negative neurological ROS  negative psych ROS   GI/Hepatic negative GI ROS, Neg liver ROS,   Endo/Other  negative endocrine ROSMorbid obesity  Renal/GU negative Renal ROS  negative genitourinary   Musculoskeletal   Abdominal (+) + obese,   Peds  Hematology negative hematology ROS (+)   Anesthesia Other Findings   Reproductive/Obstetrics negative OB ROS                            Anesthesia Physical Anesthesia Plan  ASA: III  Anesthesia Plan: General   Post-op Pain Management:    Induction: Intravenous  Airway Management Planned: Oral ETT  Additional Equipment:   Intra-op Plan:   Post-operative Plan: Extubation in OR  Informed Consent: I have reviewed the patients History and Physical, chart, labs and discussed the procedure including the risks, benefits and alternatives for the proposed anesthesia with the patient or authorized representative who has indicated his/her understanding and acceptance.   Dental Advisory Given  Plan Discussed with: CRNA  Anesthesia Plan Comments:        Anesthesia Quick Evaluation

## 2015-12-25 NOTE — H&P (Signed)
ORTHOPAEDIC HISTORY AND PHYSICAL   Chief Complaint: Lawnmower injury to left foot  HPI: Julie [name] is a 40 y.o. female who complains of left foot injury from slipping and having her foot caught in a lawnmower 3 hours PTA.  Patient denies any other complaints.  She has severe pain in the left foot, does not radiate, throbbing pain, worse with movement.  Ortho consulted by ER to evaluate patient.    Past Medical History  Diagnosis Date  . History of bronchitis   . Fracture, humerus closed, shaft 07/11/2014    left  . Asthma    Past Surgical History  Procedure Laterality Date  . Cesarean section    . Ectopic pregnancy surgery    . Lipoma excision      chest  . Tubal ligation    . Orif humerus fracture Left 07/15/2014    Procedure: OPEN REDUCTION INTERNAL FIXATION (ORIF) LEFT HUMERAL SHAFT ;  Surgeon: Renette Butters, MD;  Location: [facility];  Service: Orthopedics;  Laterality: Left;   Social History   Social History  . Marital Status: Single    Spouse Name: N/A  . Number of Children: N/A  . Years of Education: N/A   Social History Main Topics  . Smoking status: Current Every Day Smoker -- 18 years    Types: Cigarettes  . Smokeless tobacco: Never Used     Comment: 6 cig./day  . Alcohol Use: Yes     Comment: occasionally  . Drug Use: Yes    Special: Marijuana     Comment: last used 07/11/2014  . Sexual Activity: Yes    Birth Control/ Protection: IUD   Other Topics Concern  . None   Social History Narrative   History reviewed. No pertinent family history. No Known Allergies Prior to Admission medications   Medication Sig Start Date End Date Taking? Authorizing Provider  methocarbamol (ROBAXIN) 500 MG tablet Take 1 tablet (500 mg total) by mouth 2 (two) times daily. Patient not taking: Reported on 12/24/2015 11/08/15   Lacretia Leigh, MD   Dg Foot Complete Left  12/24/2015  CLINICAL DATA:  Slipped and fell while mowing lawn, with lacerations to  the left toes. Initial encounter. EXAM: LEFT FOOT - COMPLETE 3+ VIEW COMPARISON:  None. FINDINGS: There is absence of part of the distal aspect of the first distal phalanx, and a comminuted fracture involving the second distal phalanx, with associated soft tissue disruption. Visualized joint spaces are grossly preserved. Mild soft tissue swelling is noted along the plantar aspect of the forefoot. The subtalar joint is unremarkable in appearance. Small plantar and posterior calcaneal spurs are seen. IMPRESSION: Absence of part of the distal aspect of the first distal phalanx, and comminuted fracture involving the second distal phalanx, with associated soft tissue disruption. Electronically Signed   By: Garald Balding M.D.   On: 12/24/2015 22:47   - pertinent xrays, CT, MRI studies were reviewed and independently interpreted  Positive ROS: All other systems have been reviewed and were otherwise negative with the exception of those mentioned in the HPI and as above.  Physical Exam: General: Alert, no acute distress Cardiovascular: No pedal edema Respiratory: No cyanosis, no use of accessory musculature GI: No organomegaly, abdomen is soft and non-tender Skin: No lesions in the area of chief complaint Neurologic: Sensation intact distally Psychiatric: Patient is competent for consent with normal mood and affect Lymphatic: No axillary or cervical lymphadenopathy  MUSCULOSKELETAL: - traumatic wound to left great  toe and second toe with exposed bone and soft tissue loss, gross contamination - strong pulses - painful to touch  Assessment: Lawnmower injury to left great toe and second toe Open contaminated fractures to left great toe and second toe  Plan: - to OR for I&D urgently - ancef, gent, PCN, tetanus - consent obtained for I&D and possible partial amp of toes - transfer to [place] postop   N. Eduard Roux, MD [place] 1:25 AM

## 2015-12-25 NOTE — Progress Notes (Signed)
Patient stable for dc home. Heel WB in postop shoe. cipro x 2 weeks Pain meds in chart F/u 1 week for wound check Do not change dressings

## 2015-12-25 NOTE — Progress Notes (Signed)
Date: December 26 2015/Rhonda Rosana Hoes, RN, [facility], [place] program performed and given to patient.

## 2015-12-25 NOTE — Anesthesia Procedure Notes (Signed)
Procedure Name: Intubation Date/Time: 12/25/2015 1:47 AM Performed by: Lind Covert Pre-anesthesia Checklist: Patient identified, Timeout performed, Emergency Drugs available, Suction available and Patient being monitored Patient Re-evaluated:Patient Re-evaluated prior to inductionOxygen Delivery Method: Circle system utilized Preoxygenation: Pre-oxygenation with 100% oxygen Intubation Type: IV induction Laryngoscope Size: Mac and 4 Grade View: Grade I Tube type: Oral Tube size: 7.0 mm Number of attempts: 1 Airway Equipment and Method: Stylet Placement Confirmation: ETT inserted through vocal cords under direct vision,  breath sounds checked- equal and bilateral and positive ETCO2 Secured at: 22 cm Tube secured with: Tape Dental Injury: Teeth and Oropharynx as per pre-operative assessment

## 2015-12-25 NOTE — Transfer of Care (Signed)
Immediate Anesthesia Transfer of Care Note  Patient: Julie [name]  Procedure(s) Performed: Procedure(s): INCISION AND DRAINAGE With  Amputation of distal tip of Left Great and Second Toe. (Left)  Patient Location: PACU  Anesthesia Type:General  Level of Consciousness: sedated  Airway & Oxygen Therapy: Patient Spontanous Breathing and Patient connected to face mask oxygen  Post-op Assessment: Report given to RN and Post -op Vital signs reviewed and stable  Post vital signs: Reviewed and stable  Last Vitals:  Filed Vitals:   12/24/15 2204 12/25/15 0017  BP: 137/92 117/79  Pulse: 98 52  Temp: 36.8 C   Resp: 18 17    Last Pain:  Filed Vitals:   12/25/15 0133  PainSc: 7          Complications: No apparent anesthesia complications

## 2015-12-25 NOTE — Discharge Summary (Signed)
Physician Discharge Summary      Patient ID: Julie [name] MRN: [id number] DOB/AGE: [birth year] 40 y.o.  Admit date: 12/24/2015 Discharge date: 12/25/2015  Admission Diagnoses:  <principal problem not specified>  Discharge Diagnoses:  Active Problems:   Open fracture of great toe of left foot   S/P ORIF (open reduction internal fixation) fracture   Past Medical History  Diagnosis Date  . History of bronchitis   . Fracture, humerus closed, shaft 07/11/2014    left  . Asthma     Surgeries: Procedure(s): INCISION AND DRAINAGE With  Amputation of distal tip of Left Great and Second Toe. on 12/24/2015 - 12/25/2015   Consultants (if any):    Discharged Condition: Improved  Hospital Course: Julie [name] is an 40 y.o. female who was admitted 12/24/2015 with a diagnosis of <principal problem not specified> and went to the operating room on 12/24/2015 - 12/25/2015 and underwent the above named procedures.    She was given perioperative antibiotics:  Anti-infectives    Start     Dose/Rate Route Frequency Ordered Stop   12/25/15 0800  ceFAZolin (ANCEF) IVPB 2g/100 mL premix     2 g 200 mL/hr over 30 Minutes Intravenous Every 6 hours 12/25/15 0442 12/26/15 0159   12/25/15 0200  gentamicin (GARAMYCIN) 700 mg in dextrose 5 % 100 mL IVPB     7 mg/kg  99.7 kg (Adjusted) 117.5 mL/hr over 60 Minutes Intravenous Every 24 hours 12/25/15 0134     12/25/15 0100  ceFAZolin (ANCEF) 3 g in dextrose 5 % 50 mL IVPB     3 g 130 mL/hr over 30 Minutes Intravenous  Once 12/25/15 0048 12/25/15 0150   12/25/15 0100  penicillin g benzathine (BICILLIN LA) 1200000 UNIT/2ML injection 2.4 Million Units     2.4 Million Units Intramuscular  Once 12/25/15 0048 12/25/15 0058   12/25/15 0000  ciprofloxacin (CIPRO) 500 MG tablet     500 mg Oral 2 times daily 12/25/15 0255      .  She was given sequential compression devices, early ambulation for DVT prophylaxis.  She benefited maximally from the hospital stay  and there were no complications.    Recent vital signs:  Filed Vitals:   12/25/15 0550 12/25/15 0700  BP: 121/72 123/62  Pulse: 61 58  Temp: 98.8 F (37.1 C) 98.6 F (37 C)  Resp: 20 18    Recent laboratory studies:  Lab Results  Component Value Date   HGB 10.9* 09/20/2014   HGB 9.1* 07/15/2014   HGB 11.6* 07/11/2014   Lab Results  Component Value Date   WBC 13.8* 09/20/2014   PLT 311 09/20/2014   No results found for: INR Lab Results  Component Value Date   NA 138 09/20/2014   K 3.9 09/20/2014   CL 105 09/20/2014   CO2 21 09/20/2014   BUN 14 09/20/2014   CREATININE 1.12* 09/20/2014   GLUCOSE 148* 09/20/2014    Discharge Medications:     Medication List    TAKE these medications        ciprofloxacin 500 MG tablet  Commonly known as:  CIPRO  Take 1 tablet (500 mg total) by mouth 2 (two) times daily.     methocarbamol 500 MG tablet  Commonly known as:  ROBAXIN  Take 1 tablet (500 mg total) by mouth 2 (two) times daily.     oxyCODONE-acetaminophen 5-325 MG tablet  Commonly known as:  PERCOCET  Take 1-2 tablets by mouth every 4 (four)  hours as needed for severe pain.        Diagnostic Studies: Dg Foot Complete Left  12/24/2015  CLINICAL DATA:  Slipped and fell while mowing lawn, with lacerations to the left toes. Initial encounter. EXAM: LEFT FOOT - COMPLETE 3+ VIEW COMPARISON:  None. FINDINGS: There is absence of part of the distal aspect of the first distal phalanx, and a comminuted fracture involving the second distal phalanx, with associated soft tissue disruption. Visualized joint spaces are grossly preserved. Mild soft tissue swelling is noted along the plantar aspect of the forefoot. The subtalar joint is unremarkable in appearance. Small plantar and posterior calcaneal spurs are seen. IMPRESSION: Absence of part of the distal aspect of the first distal phalanx, and comminuted fracture involving the second distal phalanx, with associated soft tissue  disruption. Electronically Signed   By: Garald Balding M.D.   On: 12/24/2015 22:47    Disposition: 01-Home or Self Care      Discharge Instructions    Call MD / Call 911    Complete by:  As directed   If you experience chest pain or shortness of breath, CALL 911 and be transported to the hospital emergency room.  If you develope a fever above 101.5 F, pus (white drainage) or increased drainage or redness at the wound, or calf pain, call your surgeon's office.     Constipation Prevention    Complete by:  As directed   Drink plenty of fluids.  Prune juice may be helpful.  You may use a stool softener, such as Colace (over the counter) 100 mg twice a day.  Use MiraLax (over the counter) for constipation as needed.     Diet - low sodium heart healthy    Complete by:  As directed      Diet general    Complete by:  As directed      Driving restrictions    Complete by:  As directed   No driving while taking narcotic pain meds.     Increase activity slowly as tolerated    Complete by:  As directed            Follow-up Information    Follow up with Marianna Payment, MD In 1 week.   Specialty:  Orthopedic Surgery   Why:  For wound re-check   Contact information:   [address] [place] [address] [phone]        Signed: Marianna Payment 12/25/2015, 8:02 AM

## 2015-12-25 NOTE — Op Note (Signed)
   Date of Surgery: 12/25/2015  INDICATIONS: Ms. Niebel is a 40 y.o.-year-old female with a left open fractures of the left great toe and second toe from traumatic laceration from a lawnmower;  The patient did consent to the procedure after discussion of the risks and benefits.  PREOPERATIVE DIAGNOSIS: Open fractures with soft tissue loss of the left great toe and second toe  POSTOPERATIVE DIAGNOSIS: Same.  PROCEDURE:  1. Debridement of bone, skin, subcutaneous tissue associated with open fracture left great toe 2. Debridement of bone, skin, subcutaneous tissue associated with open fracture left second toe 3. Amputation of left great toe through IP joint 4. Amputation of left second toe through PIP joint 5. Adjacent tissue rearrangement left foot 5 cm  SURGEON: N. Eduard Roux, M.D.  ASSIST: None.  ANESTHESIA:  general  IV FLUIDS AND URINE: See anesthesia.  ESTIMATED BLOOD LOSS: Minimal mL.  IMPLANTS: None  DRAINS: None  COMPLICATIONS: None.  DESCRIPTION OF PROCEDURE: The patient was brought to the operating room and placed supine on the operating table.  The patient had been signed prior to the procedure and this was documented. The patient had the anesthesia placed by the anesthesiologist.  A time-out was performed to confirm that this was the correct patient, site, side and location. The patient did receive antibiotics prior to the incision and was re-dosed during the procedure as needed at indicated intervals.  A tourniquet not placed.  The patient had the operative extremity prepped and draped in the standard surgical fashion.    We first examined the left great toe. There was significant soft tissue loss and gross contamination. There was significant bone loss also of the distal phalanx. The germinal matrix was significantly disrupted and appeared nonviable. I performed sharp excisional debridement of the bone, tendon, skin, subcutaneous tissue with a rongeur and knife. Given the  loss of soft tissue and bone the decision was made to perform an amputation through the IP joint. All tissues were debrided back to a healthy viable border. I then turned my attention to the second toe. There was also significant plantar soft tissue loss. I performed sharp excisional debridement of the bone, tendon, skin with a knife. This was debrided back to healthy viable border. Again given the soft tissue loss and the open fracture felt that this was best treated with a primary amputation through the PIP joint. After thorough debridement I then irrigated both wounds with 6 L of normal saline. I then performed adjacent tissue rearrangement of the left great toe in order to close the wound. The second toe was closed primarily. These were closed with 3-0 nylon sutures. Bacitracin ointment was then applied. Adaptic placed over this. 4 x 4's. Sterile dressings were applied. Patient tolerated procedure well and no immediate complications.  POSTOPERATIVE PLAN: Patient will be weight-bear as tolerated to the left heel and a postop shoe. She will be discharged home on antibiotics. I will see her back in the office in one week for wound check.  Azucena Cecil, MD [place] 2:49 AM

## 2015-12-25 NOTE — Anesthesia Postprocedure Evaluation (Signed)
Anesthesia Post Note  Patient: Julie [name]  Procedure(s) Performed: Procedure(s) (LRB): INCISION AND DRAINAGE With  Amputation of distal tip of Left Great and Second Toe. (Left)  Patient location during evaluation: PACU Anesthesia Type: General Level of consciousness: awake and alert Pain management: pain level controlled Vital Signs Assessment: post-procedure vital signs reviewed and stable Respiratory status: spontaneous breathing, nonlabored ventilation, respiratory function stable and patient connected to nasal cannula oxygen Cardiovascular status: blood pressure returned to baseline and stable Postop Assessment: no signs of nausea or vomiting Anesthetic complications: no    Last Vitals:  Filed Vitals:   12/25/15 0345 12/25/15 0443  BP: 123/82 120/70  Pulse:  55  Temp: 37 C 37.2 C  Resp:  20    Last Pain:  Filed Vitals:   12/25/15 0448  PainSc: 5                  Danitra Payano L

## 2015-12-25 NOTE — Discharge Instructions (Signed)
Postoperative instructions:  Weightbearing: may weight bear on heel in postop shoe  Dressing instructions: Keep your dressing and/or splint clean and dry at all times.  It will be removed at your first post-operative appointment.  Your stitches and/or staples will be removed at this visit.  Incision instructions:  Do not soak your incision for 3 weeks after surgery.  If the incision gets wet, pat dry and do not scrub the incision.  Pain control:  You have been given a prescription to be taken as directed for post-operative pain control.  In addition, elevate the operative extremity above the heart at all times to prevent swelling and throbbing pain.  Take over-the-counter Colace, 100mg  by mouth twice a day while taking narcotic pain medications to help prevent constipation.  Follow up appointments: 1) 10-14 days for suture removal and wound check. 2) Dr. Erlinda Hong as scheduled.   -------------------------------------------------------------------------------------------------------------  After Surgery Pain Control:  After your surgery, post-surgical discomfort or pain is likely. This discomfort can last several days to a few weeks. At certain times of the day your discomfort may be more intense.  Did you receive a nerve block?  A nerve block can provide pain relief for one hour to two days after your surgery. As long as the nerve block is working, you will experience little or no sensation in the area the surgeon operated on.  As the nerve block wears off, you will begin to experience pain or discomfort. It is very important that you begin taking your prescribed pain medication before the nerve block fully wears off. Treating your pain at the first sign of the block wearing off will ensure your pain is better controlled and more tolerable when full-sensation returns. Do not wait until the pain is intolerable, as the medicine will be less effective. It is better to treat pain in advance than to try  and catch up.  General Anesthesia:  If you did not receive a nerve block during your surgery, you will need to start taking your pain medication shortly after your surgery and should continue to do so as prescribed by your surgeon.  Pain Medication:  Most commonly we prescribe Vicodin and Percocet for post-operative pain. Both of these medications contain a combination of acetaminophen (Tylenol) and a narcotic to help control pain.   It takes between 30 and 45 minutes before pain medication starts to work. It is important to take your medication before your pain level gets too intense.   Nausea is a common side effect of many pain medications. You will want to eat something before taking your pain medicine to help prevent nausea.   If you are taking a prescription pain medication that contains acetaminophen, we recommend that you do not take additional over the counter acetaminophen (Tylenol).  Other pain relieving options:   Using a cold pack to ice the affected area a few times a day (15 to 20 minutes at a time) can help to relieve pain, reduce swelling and bruising.   Elevation of the affected area can also help to reduce pain and swelling.

## 2015-12-26 MED FILL — CIPROFLOXACIN HCL 500 MG TA: 500 | 14 days supply | Qty: 28 | Fill #0

## 2015-12-27 ENCOUNTER — Encounter (HOSPITAL_COMMUNITY): Payer: Self-pay | Admitting: Emergency Medicine

## 2015-12-27 ENCOUNTER — Emergency Department (HOSPITAL_COMMUNITY): Payer: No Typology Code available for payment source

## 2015-12-27 ENCOUNTER — Emergency Department (HOSPITAL_COMMUNITY)
Admission: EM | Admit: 2015-12-27 | Discharge: 2015-12-27 | Disposition: A | Discharge status: Home / Self Care | Payer: No Typology Code available for payment source | Attending: Emergency Medicine | Admitting: Emergency Medicine

## 2015-12-27 DIAGNOSIS — J45909 Unspecified asthma, uncomplicated: Secondary | ICD-10-CM | POA: Insufficient documentation

## 2015-12-27 DIAGNOSIS — W228XXD Striking against or struck by other objects, subsequent encounter: Secondary | ICD-10-CM | POA: Insufficient documentation

## 2015-12-27 DIAGNOSIS — S92402D Displaced unspecified fracture of left great toe, subsequent encounter for fracture with routine healing: Secondary | ICD-10-CM | POA: Insufficient documentation

## 2015-12-27 DIAGNOSIS — F17210 Nicotine dependence, cigarettes, uncomplicated: Secondary | ICD-10-CM | POA: Insufficient documentation

## 2015-12-27 MED ORDER — OXYCODONE-ACETAMINOPHEN 5-325 MG PO TABS
2.0000 | ORAL_TABLET | Freq: Once | ORAL | Status: AC
  Administered 2015-12-27: 2 via ORAL
  Filled 2015-12-27: qty 2

## 2015-12-27 MED ORDER — IBUPROFEN 800 MG PO TABS: 800.0000 mg | ORAL_TABLET | Freq: Three times a day (TID) | ORAL | Status: DC | PRN

## 2015-12-27 NOTE — ED Provider Notes (Signed)
CSN: [id number]     Arrival date & time 12/27/15  1001 History   First MD Initiated Contact with Patient 12/27/15 1009     Chief Complaint  Patient presents with  . Foot Injury     (Consider location/radiation/quality/duration/timing/severity/associated sxs/prior Treatment) HPI   40 year old female presenting with complaint of left foot pain. 2 days ago patient was seen in the ED for left toes injury. She was mowing her lawn when her foot slipped underneath the mower and she suffered significant injury to her first and second toe. X-ray at that time demonstrates an open fracture to the left great toe and laceration to the second toe. Patient was managed by orthopedist, Dr. Erlinda Hong. She was brought to the OR for washout. She was subsequently placed in a postop shoe along with dressing change and Cipro for 2 weeks. She was also prescribed pain medication. Per Dr. Phoebe Sharps notes, dressing is not to be changed until she can be follow-up in one week. She reportedly using her crutch to walk today when she slipped striking her left foot against a hard surface. She report acute onset of sharp burning pain to the affected site and was concerned for worsening injury. She also mentioned that she did not receive her pain medication yet and she felt that her prescription paper was stolen from her. Patient mentioned she has not had any pain medication since discharge from hospital 2 days ago. She complains of 10 out of 10 persistent pain and requesting pain medication. She denies any other injury. Denies any precipitating symptoms prior to her recent injury. Pt sts she is homeless and therefore it's easy for her medication to get stolen.       Past Medical History  Diagnosis Date  . History of bronchitis   . Fracture, humerus closed, shaft 07/11/2014    left  . Asthma    Past Surgical History  Procedure Laterality Date  . Cesarean section    . Ectopic pregnancy surgery    . Lipoma excision      chest  . Tubal  ligation    . Orif humerus fracture Left 07/15/2014    Procedure: OPEN REDUCTION INTERNAL FIXATION (ORIF) LEFT HUMERAL SHAFT ;  Surgeon: Renette Butters, MD;  Location: [facility];  Service: Orthopedics;  Laterality: Left;  . Incision and drainage Left 12/25/2015    Procedure: INCISION AND DRAINAGE With  Amputation of distal tip of Left Great and Second Toe.;  Surgeon: Leandrew Koyanagi, MD;  Location: [facility];  Service: Orthopedics;  Laterality: Left;   No family history on file. Social History  Substance Use Topics  . Smoking status: Current Every Day Smoker -- 18 years    Types: Cigarettes  . Smokeless tobacco: Never Used     Comment: 6 cig./day  . Alcohol Use: Yes     Comment: occasionally   OB History    No data available     Review of Systems  Constitutional: Negative for fever.  Skin: Positive for wound.  Neurological: Negative for numbness.      Allergies  Review of patient's allergies indicates no known allergies.  Home Medications   Prior to Admission medications   Medication Sig Start Date End Date Taking? Authorizing Provider  ciprofloxacin (CIPRO) 500 MG tablet Take 1 tablet (500 mg total) by mouth 2 (two) times daily. 12/25/15   Naiping Ephriam Jenkins, MD  methocarbamol (ROBAXIN) 500 MG tablet Take 1 tablet (500 mg total) by mouth 2 (two)  times daily. Patient not taking: Reported on 12/24/2015 11/08/15   Lacretia Leigh, MD  oxyCODONE-acetaminophen (PERCOCET) 5-325 MG tablet Take 1-2 tablets by mouth every 4 (four) hours as needed for severe pain. 12/25/15   Naiping Ephriam Jenkins, MD   BP 140/72 mmHg  Pulse 62  Temp(Src) 98.7 F (37.1 C) (Oral)  Resp 20  SpO2 98%  LMP 12/09/2015 (Approximate) Physical Exam  Constitutional: She appears well-developed and well-nourished. No distress.  HENT:  Head: Atraumatic.  Eyes: Conjunctivae are normal.  Neck: Neck supple.  Musculoskeletal: She exhibits tenderness (Left foot: Dressing were noted that wrapped around to the distal foot  with tenderness to palpation at the first and second toes.  no fresh bleeding noted.  ).  Neurological: She is alert.  Skin: No rash noted.  Psychiatric: She has a normal mood and affect.  Nursing note and vitals reviewed.   ED Course  Procedures (including critical care time) Labs Review Labs Reviewed - No data to display  Imaging Review Dg Foot Complete Left  12/27/2015  CLINICAL DATA:  40 year old female with a history of lawnmower accident. Interval surgery EXAM: LEFT FOOT - COMPLETE 3+ VIEW COMPARISON:  12/24/2015 FINDINGS: Interval surgical changes of partial left great toe and second toe amputation. Small bony fragments present near the former left IP joint. No radiopaque foreign body. No gas within the soft tissues. Enthesopathic changes at the Achilles insertion. No new acute bony abnormality. IMPRESSION: Early interval surgical changes of partial left great toe and second toe amputation, without complicating features. Signed, Dulcy Fanny. Earleen Newport, DO [facility] Electronically Signed   By: Corrie Mckusick D.O.   On: 12/27/2015 10:48   I have personally reviewed and evaluated these images and lab results as part of my medical decision-making.   EKG Interpretation None      MDM   Final diagnoses:  Open fracture of great toe of left foot, with routine healing, subsequent encounter    BP 140/72 mmHg  Pulse 62  Temp(Src) 98.7 F (37.1 C) (Oral)  Resp 20  SpO2 98%  LMP 12/09/2015 (Approximate)   10:48 AM Pt here with increasing pain to her L foot when she was "walking with crutches and slipped". Recently.  She also report that she have lost her prescription script that she received upon discharge on 12/24/2015 from Dr. Erlinda Hong.  The [facility] database shows pt filled 50 percocet 5-325mg  on 12/25/2015.  There's also a 1 refill option as well.  I did discussed this with pt.  Pt sts "someone must have filled my script, i would like to  file a police report". I encourage pt to find the pharmacy ([facility] in [place])  that the medication was filled to file the report.  Plan to treat pt's pain in the ER.  I also made pt aware that we do not refill lost or stolen narcotic prescription.  Pt voice understanding.  Recommend f/u with Dr. Erlinda Hong for further care.    Domenic Moras, PA-C 12/27/15 [place], MD 12/28/15 [phone]

## 2015-12-27 NOTE — ED Notes (Signed)
Bed: [id number] Expected date:  Expected time:  Means of arrival:  Comments: EMS foot injury

## 2015-12-27 NOTE — Discharge Instructions (Signed)
Please follow up with your orthopedist for further management of your toes injury.  Your orthopedist may refill your pain medication.  Take ibuprofen as needed for pain.   RICE for Routine Care of Injuries Theroutine careofmanyinjuriesincludes rest, ice, compression, and elevation (RICE therapy). RICE therapy is often recommended for injuries to soft tissues, such as a muscle strain, ligament injuries, bruises, and overuse injuries. It can also be used for some bony injuries. Using RICE therapy can help to relieve pain, lessen swelling, and enable your body to heal. Rest Rest is required to allow your body to heal. This usually involves reducing your normal activities and avoiding use of the injured part of your body. Generally, you can return to your normal activities when you are comfortable and have been given permission by your health care provider. Ice Icing your injury helps to keep the swelling down, and it lessens pain. Do not apply ice directly to your skin.  Put ice in a plastic bag.  Place a towel between your skin and the bag.  Leave the ice on for 20 minutes, 2-3 times a day. Do this for as long as you are directed by your health care provider. Compression Compression means putting pressure on the injured area. Compression helps to keep swelling down, gives support, and helps with discomfort. Compression may be done with an elastic bandage. If an elastic bandage has been applied, follow these general tips:  Remove and reapply the bandage every 3-4 hours or as directed by your health care provider.  Make sure the bandage is not wrapped too tightly, because this can cut off circulation. If part of your body beyond the bandage becomes blue, numb, cold, swollen, or more painful, your bandage is most likely too tight. If this occurs, remove your bandage and reapply it more loosely.  See your health care provider if the bandage seems to be making your problems worse rather than  better. Elevation Elevation means keeping the injured area raised. This helps to lessen swelling and decrease pain. If possible, your injured area should be elevated at or above the level of your heart or the center of your chest. Black? You should seek medical care if:  Your pain and swelling continue.  Your symptoms are getting worse rather than improving. These symptoms may indicate that further evaluation or further X-rays are needed. Sometimes, X-rays may not show a small broken bone (fracture) until a number of days later. Make a follow-up appointment with your health care provider. WHEN SHOULD I SEEK IMMEDIATE MEDICAL CARE? You should seek immediate medical care if:  You have sudden severe pain at or below the area of your injury.  You have redness or increased swelling around your injury.  You have tingling or numbness at or below the area of your injury that does not improve after you remove the elastic bandage.   This information is not intended to replace advice given to you by your health care provider. Make sure you discuss any questions you have with your health care provider.   Document Released: 09/19/2000 Document Revised: 02/26/2015 Document Reviewed: 05/15/2014 Elsevier Interactive Patient Education [employer].

## 2015-12-27 NOTE — ED Notes (Addendum)
Upon initial encounter with pt, pt was belligerent and argumentative with this RN. Pt sts when questioned, " I need some god damn pain meds right now. I'm tired of this bullshit. I haven't had any since I was discharged. Your gone hav to strap me down cause I'm feeling angry and gonna start throwin shit." PA, Agricultural consultant and security notified of pt behavior.

## 2015-12-27 NOTE — ED Notes (Addendum)
Pt from residence vis [facility]- Per [facility], pt had recent surgery on L foot to remove great toe and half of second toe a few days ago at this facility. Pt reports that she was walking with crutches and slipped. Pt complains that she injured the foot that was operated on. Per EMS, pt ambulated to truck. Pt refused to have dressing loosened and ice pack. Pt is A&O and in NAD. Pt is homeless and reports that she lost the prescriptions that she received upon d/c on 7/5-17.

## 2016-01-01 ENCOUNTER — Encounter (HOSPITAL_COMMUNITY): Payer: Self-pay | Admitting: Orthopaedic Surgery

## 2016-02-03 ENCOUNTER — Emergency Department (HOSPITAL_COMMUNITY)
Admission: EM | Admit: 2016-02-03 | Discharge: 2016-02-03 | Disposition: A | Discharge status: Home / Self Care | Payer: Self-pay | Attending: Emergency Medicine | Admitting: Emergency Medicine

## 2016-02-03 ENCOUNTER — Encounter (HOSPITAL_COMMUNITY): Payer: Self-pay | Admitting: Emergency Medicine

## 2016-02-03 DIAGNOSIS — L0291 Cutaneous abscess, unspecified: Secondary | ICD-10-CM

## 2016-02-03 DIAGNOSIS — J45909 Unspecified asthma, uncomplicated: Secondary | ICD-10-CM | POA: Insufficient documentation

## 2016-02-03 DIAGNOSIS — L02416 Cutaneous abscess of left lower limb: Secondary | ICD-10-CM | POA: Insufficient documentation

## 2016-02-03 DIAGNOSIS — F17210 Nicotine dependence, cigarettes, uncomplicated: Secondary | ICD-10-CM | POA: Insufficient documentation

## 2016-02-03 MED ORDER — DOXYCYCLINE HYCLATE 100 MG PO CAPS
100.0000 mg | ORAL_CAPSULE | Freq: Two times a day (BID) | ORAL | 0 refills | Status: DC
Start: 2016-02-03 — End: 2016-10-29

## 2016-02-03 MED ORDER — DOXYCYCLINE HYCLATE 100 MG PO TABS
100.0000 mg | ORAL_TABLET | Freq: Once | ORAL | Status: AC
  Administered 2016-02-03: 100 mg via ORAL
  Filled 2016-02-03: qty 1

## 2016-02-03 MED ORDER — CEPHALEXIN 500 MG PO CAPS
500.0000 mg | ORAL_CAPSULE | Freq: Once | ORAL | Status: AC
  Administered 2016-02-03: 500 mg via ORAL
  Filled 2016-02-03: qty 1

## 2016-02-03 MED ORDER — LIDOCAINE-EPINEPHRINE 1 %-1:100000 IJ SOLN
10.0000 mL | Freq: Once | INTRAMUSCULAR | Status: DC
  Filled 2016-02-03: qty 1

## 2016-02-03 MED ORDER — CEPHALEXIN 500 MG PO CAPS: 500.0000 mg | ORAL_CAPSULE | Freq: Two times a day (BID) | ORAL | 0 refills | Status: DC

## 2016-02-03 NOTE — ED Notes (Signed)
MD at bedside. 

## 2016-02-03 NOTE — ED Provider Notes (Signed)
[facility] DEPT Provider Note   CSN: [id number] Arrival date & time: 02/03/16  0013  By signing my name below, I, Reola Mosher, attest that this documentation has been prepared under the direction and in the presence of Everlene Balls, MD. Electronically Signed: Reola Mosher, ED Scribe. 02/03/16. 3:52 AM.  History   Chief Complaint Chief Complaint  Patient presents with  . Abscess    "multiple to the groin/vaginal area"   The history is provided by the patient. No language interpreter was used.     HPI Comments: Julie [name] is a 40 y.o. female BIB EMS, who presents to the Emergency Department complaining of gradual onset, gradually worsening, moderate area of pain and swelling to her left thigh x ~2 days. Pt reports that she has a hx of similar symptoms, and gets them close to once a year. No OTC medications/home remedies tried/alleviating factors noted PTA. Her pain to the area is exacerbated with palpation. Denies drainage from the area, and any other associated symptoms.   Past Medical History:  Diagnosis Date  . Asthma   . Fracture, humerus closed, shaft 07/11/2014   left  . History of bronchitis    Patient Active Problem List   Diagnosis Date Noted  . Open fracture of great toe of left foot 12/25/2015  . S/P ORIF (open reduction internal fixation) fracture 12/25/2015   Past Surgical History:  Procedure Laterality Date  . CESAREAN SECTION    . ECTOPIC PREGNANCY SURGERY    . INCISION AND DRAINAGE Left 12/25/2015   Procedure: INCISION AND DRAINAGE With  Amputation of distal tip of Left Great and Second Toe.;  Surgeon: Leandrew Koyanagi, MD;  Location: [facility];  Service: Orthopedics;  Laterality: Left;  . LIPOMA EXCISION     chest  . ORIF HUMERUS FRACTURE Left 07/15/2014   Procedure: OPEN REDUCTION INTERNAL FIXATION (ORIF) LEFT HUMERAL SHAFT ;  Surgeon: Renette Butters, MD;  Location: [facility];  Service: Orthopedics;  Laterality: Left;  .  TUBAL LIGATION     OB History    No data available     Home Medications    Prior to Admission medications   Medication Sig Start Date End Date Taking? Authorizing Provider  ciprofloxacin (CIPRO) 500 MG tablet Take 1 tablet (500 mg total) by mouth 2 (two) times daily. Patient not taking: Reported on 02/03/2016 12/25/15   Leandrew Koyanagi, MD  ibuprofen (ADVIL,MOTRIN) 800 MG tablet Take 1 tablet (800 mg total) by mouth every 8 (eight) hours as needed for moderate pain. Patient not taking: Reported on 02/03/2016 12/27/15   Domenic Moras, PA-C  methocarbamol (ROBAXIN) 500 MG tablet Take 1 tablet (500 mg total) by mouth 2 (two) times daily. Patient not taking: Reported on 12/24/2015 11/08/15   Lacretia Leigh, MD  oxyCODONE-acetaminophen (PERCOCET) 5-325 MG tablet Take 1-2 tablets by mouth every 4 (four) hours as needed for severe pain. Patient not taking: Reported on 12/27/2015 12/25/15   Leandrew Koyanagi, MD   Family History History reviewed. No pertinent family history.  Social History Social History  Substance Use Topics  . Smoking status: Current Every Day Smoker    Years: 18.00    Types: Cigarettes  . Smokeless tobacco: Never Used     Comment: 6 cig./day  . Alcohol use Yes     Comment: occasionally   Allergies   Review of patient's allergies indicates no known allergies.  Review of Systems Review of Systems 10 Systems reviewed and  all are negative for acute change except as noted in the HPI.  Physical Exam Updated Vital Signs BP 119/79 (BP Location: Left Arm)   Pulse 101   Temp 98.2 F (36.8 C) (Oral)   Resp 20   Ht 5\' 11"  ([id number] m)   Wt 294 lb (133.4 kg)   SpO2 100%   BMI 41.00 kg/m   Physical Exam  Constitutional: She is oriented to person, place, and time. She appears well-developed and well-nourished. No distress.  HENT:  Head: Normocephalic and atraumatic.  Nose: Nose normal.  Mouth/Throat: Oropharynx is clear and moist. No oropharyngeal exudate.  Eyes: Conjunctivae and EOM are  normal. Pupils are equal, round, and reactive to light. No scleral icterus.  Neck: Normal range of motion. Neck supple. No JVD present. No tracheal deviation present. No thyromegaly present.  Cardiovascular: Normal rate, regular rhythm and normal heart sounds.  Exam reveals no gallop and no friction rub.   No murmur heard. Pulmonary/Chest: Effort normal and breath sounds normal. No respiratory distress. She has no wheezes. She exhibits no tenderness.  Abdominal: Soft. Bowel sounds are normal. She exhibits no distension and no mass. There is no tenderness. There is no rebound and no guarding.  Musculoskeletal: Normal range of motion. She exhibits no edema or tenderness.  Pt has a left great toe amputation.   Lymphadenopathy:    She has no cervical adenopathy.  Neurological: She is alert and oriented to person, place, and time. No cranial nerve deficit. She exhibits normal muscle tone.  Skin: Skin is warm and dry. No rash noted. No erythema. No pallor.  5cm abscess to the left inner upper thigh. No drainage from the area.   Nursing note and vitals reviewed.  ED Treatments / Results  DIAGNOSTIC STUDIES: Oxygen Saturation is 100% on RA, normal by my interpretation.   COORDINATION OF CARE: 3:45 AM-Discussed next steps with pt. Pt verbalized understanding and is agreeable with the plan.   Procedures Procedures (including critical care time)  Medications Ordered in ED Medications - No data to display  Initial Impression / Assessment and Plan / ED Course  I have reviewed the triage vital signs and the nursing notes.  Pertinent labs & imaging results that were available during my care of the patient were reviewed by me and considered in my medical decision making (see chart for details).  Clinical Course   Patient presents to the ED for abscess. Will perform I&D and place on antibiotics given the location.  First dose given in the ED.  RX for doxy and keflex.  PCP fu advised within 3 days  for wound check.  She appears well and in NAD. VS remain within her normal limits and she is safe for DC.   INCISION AND DRAINAGE Performed by: Everlene Balls Consent: Verbal consent obtained. Risks and benefits: risks, benefits and alternatives were discussed Type: abscess  Body area: L upper thigh  Anesthesia: local infiltration  Incision was made with a scalpel.  Local anesthetic: lidocaine 1% with epinephrine  Anesthetic total: 8 ml  Complexity: complex Blunt dissection to break up loculations  Drainage: purulent  Drainage amount: 20cc   Patient tolerance: Patient tolerated the procedure well with no immediate complications.     Final Clinical Impressions(s) / ED Diagnoses   Final diagnoses:  None    New Prescriptions New Prescriptions   No medications on file    I personally performed the services described in this documentation, which was scribed in my presence. The recorded  information has been reviewed and is accurate.       Everlene Balls, MD 02/03/16 [phone]

## 2016-02-03 NOTE — ED Notes (Signed)
Bed: WTR5 Expected date:  Expected time:  Means of arrival:  Comments: Vaginal pain/abcess

## 2016-02-03 NOTE — ED Triage Notes (Signed)
Patient arrives with EMS from [facility]. Patient reports having "multiple boils between her vagina and groin" she was "rubbing it and she was afraid she broke it". Patient ambulatory on arrival to ER.

## 2016-08-17 ENCOUNTER — Inpatient Hospital Stay (HOSPITAL_COMMUNITY): Admit: 2016-08-17 | Payer: Self-pay

## 2016-08-27 ENCOUNTER — Ambulatory Visit (HOSPITAL_COMMUNITY)
Admission: RE | Admit: 2016-08-27 | Discharge: 2016-08-27 | Disposition: A | Discharge status: Home / Self Care | Payer: Medicaid Other | Attending: Psychiatry | Admitting: Psychiatry

## 2016-08-27 ENCOUNTER — Encounter (HOSPITAL_COMMUNITY): Payer: Self-pay | Admitting: Behavioral Health

## 2016-08-27 NOTE — H&P (Signed)
Behavioral Health Medical Screening Exam  Julie [name] is an 41 y.o. female.  Total Time spent with patient: 15 minutes  Psychiatric Specialty Exam: Physical Exam  Constitutional: She is oriented to person, place, and time. She appears well-developed and well-nourished.  HENT:  Head: Normocephalic.  Neck: Normal range of motion.  Cardiovascular: Normal rate and regular rhythm.   Respiratory: Effort normal and breath sounds normal.  GI: Soft. Bowel sounds are normal.  Musculoskeletal: Normal range of motion.  Neurological: She is alert and oriented to person, place, and time.  Skin: Skin is warm and dry.    Review of Systems  Psychiatric/Behavioral: Positive for depression and substance abuse. Negative for hallucinations and suicidal ideas. The patient is nervous/anxious. The patient does not have insomnia.   All other systems reviewed and are negative.   There were no vitals taken for this visit.There is no height or weight on file to calculate BMI.  General Appearance: Casual and Fairly Groomed  Eye Contact:  Fair  Speech:  Clear and Coherent and Normal Rate  Volume:  Normal  Mood:  Anxious  Affect:  Appropriate and Congruent  Thought Process:  Coherent, Linear and Descriptions of Associations: Loose  Orientation:  Full (Time, Place, and Person)  Thought Content:  Focused on drug treatment plans  Suicidal Thoughts:  No  Homicidal Thoughts:  No  Memory:  Immediate;   Fair Recent;   Fair Remote;   Fair  Judgement:  Fair  Insight:  Fair  Psychomotor Activity:  Normal  Concentration: Concentration: Fair and Attention Span: Fair  Recall:  [employer] [place]: Fair  Akathisia:  No  Handed:    AIMS (if indicated):     Assets:  Communication Skills Desire for Improvement Resilience Social Support  Sleep:       Musculoskeletal: Strength & Muscle Tone: within normal limits Gait & Station: normal Patient leans: N/A  BP 162/81, O2: 99, T 99.0,  HR 78  Recommendations:  Based on my evaluation the patient does not appear to have an emergency medical condition.  Benjamine Mola, FNP 08/27/2016, 6:26PM
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
## 2016-10-29 ENCOUNTER — Encounter (HOSPITAL_COMMUNITY): Payer: Self-pay | Admitting: Emergency Medicine

## 2016-10-29 ENCOUNTER — Emergency Department (HOSPITAL_COMMUNITY): Payer: Medicaid Other

## 2016-10-29 ENCOUNTER — Emergency Department (HOSPITAL_COMMUNITY)
Admission: EM | Admit: 2016-10-29 | Discharge: 2016-10-29 | Disposition: A | Discharge status: Home / Self Care | Payer: Medicaid Other | Attending: Emergency Medicine | Admitting: Emergency Medicine

## 2016-10-29 DIAGNOSIS — M79672 Pain in left foot: Secondary | ICD-10-CM | POA: Insufficient documentation

## 2016-10-29 DIAGNOSIS — Z79899 Other long term (current) drug therapy: Secondary | ICD-10-CM | POA: Insufficient documentation

## 2016-10-29 DIAGNOSIS — J45909 Unspecified asthma, uncomplicated: Secondary | ICD-10-CM | POA: Insufficient documentation

## 2016-10-29 MED ORDER — OXYCODONE-ACETAMINOPHEN 5-325 MG PO TABS
1.0000 | ORAL_TABLET | Freq: Once | ORAL | Status: AC
  Administered 2016-10-29: 1 via ORAL
  Filled 2016-10-29: qty 1

## 2016-10-29 MED ORDER — IBUPROFEN 600 MG PO TABS: 600.0000 mg | ORAL_TABLET | Freq: Three times a day (TID) | ORAL | 0 refills | Status: DC | PRN

## 2016-10-29 MED ORDER — IBUPROFEN 400 MG PO TABS
600.0000 mg | ORAL_TABLET | Freq: Once | ORAL | Status: AC
  Administered 2016-10-29: 600 mg via ORAL
  Filled 2016-10-29: qty 1

## 2016-10-29 NOTE — ED Provider Notes (Signed)
[place] DEPT Provider Note   CSN: [id number] Arrival date & time: 10/29/16  0028  By signing my name below, I, Collene Leyden, attest that this documentation has been prepared under the direction and in the presence of Jola Schmidt, MD. Electronically Signed: Collene Leyden, Scribe. 10/29/16. 3:00 AM.  History   Chief Complaint Chief Complaint  Patient presents with  . Ankle Pain  . Foot Pain   HPI Comments: Julie [name] is a 41 y.o. female with a history of left toe amputations, who presents to the Emergency Department by ambulance, complaining of sudden-onset, constant left foot pain that began 3 hours prior to arrival. Patient states she went to sleep and woke up with "burning and stinging" of the left foot. Patient reports a history of left toe amputations by lawnmower blade with toe revival last July. Patient reports associated left ankle pain and swelling. No modifying factors indicated. Patient denies any fever, chills, nausea, vomiting, or weakness.  The history is provided by the patient. No language interpreter was used.    Past Medical History:  Diagnosis Date  . Asthma   . Fracture, humerus closed, shaft 07/11/2014   left  . History of bronchitis     Patient Active Problem List   Diagnosis Date Noted  . Open fracture of great toe of left foot 12/25/2015  . S/P ORIF (open reduction internal fixation) fracture 12/25/2015    Past Surgical History:  Procedure Laterality Date  . CESAREAN SECTION    . ECTOPIC PREGNANCY SURGERY    . INCISION AND DRAINAGE Left 12/25/2015   Procedure: INCISION AND DRAINAGE With  Amputation of distal tip of Left Great and Second Toe.;  Surgeon: Leandrew Koyanagi, MD;  [facility];  Service: Orthopedics;  Laterality: Left;  . LIPOMA EXCISION     chest  . ORIF HUMERUS FRACTURE Left 07/15/2014   Procedure: OPEN REDUCTION INTERNAL FIXATION (ORIF) LEFT HUMERAL SHAFT ;  Surgeon: Renette Butters, MD;  Location: [place];   Service: Orthopedics;  Laterality: Left;  . TUBAL LIGATION      OB History    No data available       Home Medications    Prior to Admission medications   Medication Sig Start Date End Date Taking? Authorizing Provider  cephALEXin (KEFLEX) 500 MG capsule Take 1 capsule (500 mg total) by mouth 2 (two) times daily. 02/03/16   Everlene Balls, MD  doxycycline (VIBRAMYCIN) 100 MG capsule Take 1 capsule (100 mg total) by mouth 2 (two) times daily. One po bid x 7 days 02/03/16   Everlene Balls, MD    Family History No family history on file.  Social History Social History  Substance Use Topics  . Smoking status: Current Every Day Smoker    Years: 18.00    Types: Cigarettes  . Smokeless tobacco: Never Used     Comment: 6 cig./day  . Alcohol use Yes     Comment: occasionally     Allergies   Patient has no known allergies.   Review of Systems Review of Systems  A complete 10 system review of systems was obtained and all systems are negative except as noted in the HPI and PMH.    Physical Exam Updated Vital Signs BP 128/79   Pulse 61   Temp 98.5 F (36.9 C) (Oral)   Resp 16   Ht 5\' 10"  (1.778 m)   Wt 269 lb (122 kg)   LMP 10/24/2016 (Exact Date)  SpO2 97%   BMI 38.60 kg/m   Physical Exam  Constitutional: She is oriented to person, place, and time. She appears well-developed and well-nourished.  HENT:  Head: Normocephalic.  Eyes: EOM are normal.  Neck: Normal range of motion.  Pulmonary/Chest: Effort normal.  Abdominal: She exhibits no distension.  Musculoskeletal: Normal range of motion.  Normal pulses in the PT and DP arteries of the left foot.  Compartments of left foot are soft.  Well-healed partial amputations of the left first and second toe without surrounding signs of infection.  Mild pain with range of motion of left ankle.  No obvious deformity of the left ankle.  Neurological: She is alert and oriented to person, place, and time.  Psychiatric: She has a  normal mood and affect.  Nursing note and vitals reviewed.    ED Treatments / Results  DIAGNOSTIC STUDIES: Oxygen Saturation is 97% on RA, normal by my interpretation.    COORDINATION OF CARE: 2:59 AM Discussed treatment plan with pt at bedside and pt agreed to plan, which includes x-ray and pain medication.   Labs (all labs ordered are listed, but only abnormal results are displayed) Labs Reviewed - No data to display  EKG  EKG Interpretation None       Radiology Dg Foot Complete Left  Result Date: 10/29/2016 CLINICAL DATA:  41 y/o  F; swelling of the left ankle. EXAM: LEFT FOOT - COMPLETE 3+ VIEW COMPARISON:  12/27/2015 left foot radiographs. FINDINGS: Stable postsurgical changes related to amputation of the first and second distal digits. Lisfranc alignment is maintained. No acute bony articular abnormality identified. Generalized soft tissue swelling. Dorsal and plantar calcaneal enthesophytes. IMPRESSION: No acute bony or articular abnormality identified. Generalized soft tissue swelling. Electronically Signed   By: Kristine Garbe M.D.   On: 10/29/2016 03:14    Procedures Procedures (including critical care time)  Medications Ordered in ED Medications - No data to display   Initial Impression / Assessment and Plan / ED Course  I have reviewed the triage vital signs and the nursing notes.  Pertinent labs & imaging results that were available during my care of the patient were reviewed by me and considered in my medical decision making (see chart for details).     Patient normal pulses in the left foot.  No overt signs of infection.  Doubt deep space infection.  Could represent gout with mild pain with range of motion of left ankle joint and tenderness of the dorsum of the left foot.  No erythema.  Primary care follow-up.  Home with anti-inflammatories  Final Clinical Impressions(s) / ED Diagnoses   Final diagnoses:  None    New Prescriptions New  Prescriptions   IBUPROFEN (ADVIL,MOTRIN) 600 MG TABLET    Take 1 tablet (600 mg total) by mouth every 8 (eight) hours as needed.   I personally performed the services described in this documentation, which was scribed in my presence. The recorded information has been reviewed and is accurate.        Jola Schmidt, MD 10/29/16 [id number]

## 2016-10-29 NOTE — ED Triage Notes (Signed)
Pt to ED via [facility] with c/o pain and swelling to left ankle and foot.  No known injury.  Pt st's she went to sleep earlier tonight and woke up with the pain and swelling

## 2016-12-08 ENCOUNTER — Ambulatory Visit: Payer: Self-pay | Admitting: Obstetrics and Gynecology

## 2016-12-12 ENCOUNTER — Encounter (HOSPITAL_COMMUNITY): Payer: Self-pay | Admitting: Emergency Medicine

## 2016-12-12 ENCOUNTER — Emergency Department (HOSPITAL_COMMUNITY)
Admission: EM | Admit: 2016-12-12 | Discharge: 2016-12-12 | Disposition: A | Discharge status: Other Acute Inpatient Hospital | Payer: Medicaid Other | Attending: Emergency Medicine | Admitting: Emergency Medicine

## 2016-12-12 DIAGNOSIS — R440 Auditory hallucinations: Secondary | ICD-10-CM | POA: Diagnosis present

## 2016-12-12 DIAGNOSIS — Z79899 Other long term (current) drug therapy: Secondary | ICD-10-CM | POA: Diagnosis not present

## 2016-12-12 DIAGNOSIS — J45909 Unspecified asthma, uncomplicated: Secondary | ICD-10-CM | POA: Diagnosis not present

## 2016-12-12 DIAGNOSIS — F1420 Cocaine dependence, uncomplicated: Secondary | ICD-10-CM | POA: Diagnosis present

## 2016-12-12 DIAGNOSIS — F17210 Nicotine dependence, cigarettes, uncomplicated: Secondary | ICD-10-CM | POA: Diagnosis not present

## 2016-12-12 DIAGNOSIS — F14150 Cocaine abuse with cocaine-induced psychotic disorder with delusions: Secondary | ICD-10-CM | POA: Diagnosis present

## 2016-12-12 LAB — RAPID URINE DRUG SCREEN, HOSP PERFORMED
Amphetamines: NOT DETECTED
BARBITURATES: NOT DETECTED
Benzodiazepines: NOT DETECTED
Cocaine: POSITIVE — AB
Opiates: NOT DETECTED
Tetrahydrocannabinol: POSITIVE — AB

## 2016-12-12 LAB — ACETAMINOPHEN LEVEL: Acetaminophen (Tylenol), Serum: <10 ug/mL — ABNORMAL LOW (ref 10–30)

## 2016-12-12 LAB — COMPREHENSIVE METABOLIC PANEL
ALBUMIN: 4.3 g/dL (ref 3.5–5.0)
ALT: 18 U/L (ref 14–54)
AST: 30 U/L (ref 15–41)
Alkaline Phosphatase: 52 U/L (ref 38–126)
Anion gap: 7 (ref 5–15)
BILIRUBIN TOTAL: 0.3 mg/dL (ref 0.3–1.2)
BUN: 11 mg/dL (ref 6–20)
CALCIUM: 9.9 mg/dL (ref 8.9–10.3)
CO2: 23 mmol/L (ref 22–32)
Chloride: 113 mmol/L — ABNORMAL HIGH (ref 101–111)
Creatinine, Ser: 0.91 mg/dL (ref 0.44–1.00)
GFR calc Af Amer: >60 mL/min (ref >60)
GFR calc non Af Amer: >60 mL/min (ref >60)
GLUCOSE: 80 mg/dL (ref 65–99)
Potassium: 3.3 mmol/L — ABNORMAL LOW (ref 3.5–5.1)
Sodium: 143 mmol/L (ref 135–145)
TOTAL PROTEIN: 7.6 g/dL (ref 6.5–8.1)

## 2016-12-12 LAB — SALICYLATE LEVEL: Salicylate Lvl: <7 mg/dL (ref 2.8–30.0)

## 2016-12-12 LAB — CBC
HEMATOCRIT: 32 % — AB (ref 36.0–46.0)
Hemoglobin: 9.7 g/dL — ABNORMAL LOW (ref 12.0–15.0)
MCH: 21.7 pg — ABNORMAL LOW (ref 26.0–34.0)
MCHC: 30.3 g/dL (ref 30.0–36.0)
MCV: 71.7 fL — AB (ref 78.0–100.0)
Platelets: 279 10*3/uL (ref 150–400)
RBC: 4.46 MIL/uL (ref 3.87–5.11)
RDW: 19.6 % — AB (ref 11.5–15.5)
WBC: 6.4 10*3/uL (ref 4.0–10.5)

## 2016-12-12 LAB — ETHANOL: Alcohol, Ethyl (B): <5 mg/dL (ref <5)

## 2016-12-12 MED ORDER — POTASSIUM CHLORIDE CRYS ER 20 MEQ PO TBCR
40.0000 meq | EXTENDED_RELEASE_TABLET | Freq: Once | ORAL | Status: AC
  Administered 2016-12-12: 40 meq via ORAL
  Filled 2016-12-12: qty 1
  Filled 2016-12-12: qty 2

## 2016-12-12 MED ORDER — GABAPENTIN 100 MG PO CAPS
200.0000 mg | ORAL_CAPSULE | Freq: Two times a day (BID) | ORAL | Status: DC
  Administered 2016-12-12: 200 mg via ORAL
  Filled 2016-12-12: qty 2

## 2016-12-12 MED ORDER — RISPERIDONE 1 MG PO TABS
1.0000 mg | ORAL_TABLET | Freq: Two times a day (BID) | ORAL | Status: DC
  Administered 2016-12-12: 1 mg via ORAL
  Filled 2016-12-12: qty 1

## 2016-12-12 NOTE — ED Notes (Signed)
TTS at bedside. 

## 2016-12-12 NOTE — Progress Notes (Signed)
CSW faxed patient's referral to: [place], [place], [place], [place], [place], [place] and [place].   Abundio Miu, [place] [facility]  Clinical Social Worker [phone]

## 2016-12-12 NOTE — ED Notes (Signed)
Bed: [facility] Expected date:  Expected time:  Means of arrival:  Comments: TR5

## 2016-12-12 NOTE — Progress Notes (Signed)
Awaiting voluntary paperwork from [facility].  Call Orville Govern., RN when Betsy Pries departs at [phone].

## 2016-12-12 NOTE — BHH Counselor (Signed)
Patient signed voluntary paperwork for [facility].  Signed voluntary form faxed back to [facility]

## 2016-12-12 NOTE — ED Notes (Signed)
Pt unable to provide urine specimen due to menses, but pt stated that if tested, she would be positive for marijuana and cocaine.

## 2016-12-12 NOTE — ED Notes (Signed)
Patient left ambulatory for [facility] via [place] transportation.  Patient is calm and is physically stable.  Patient has been cooperative and pleasant today.  Contacted [facility] to let them know patient had departed.
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
## 2016-12-12 NOTE — Progress Notes (Signed)
Patient accepted to [facility] by Dr. Ree Kida. Patient can arrive anytime prior to 12am. Staff reported that they will fax a voluntary consent for patient to sign. Patient informed and reported that she will sign in voluntary. Patient's RN can call report to [phone]. TTS aware and will follow up.

## 2016-12-12 NOTE — ED Notes (Signed)
Patient was irritable when aroused by treatment team.  She states, "I don't know what the hallucinations have to do with this.  Can't ya'll look at my record?  I haven't been taking my medications.  I'm not hearing voices while I'm sleeping."  Patient was not cooperative and stated that MD was rude.  Patient was asked for a urine sample, which she has yet to provide.
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
## 2016-12-12 NOTE — ED Notes (Signed)
Called report to Orville Govern., RN at [facility] in [place], [place].  Patient will be ready to transport via [facility] once voluntary paperwork is received.
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
## 2016-12-12 NOTE — ED Notes (Signed)
Patient states she is not having currently thoughts of self harm.  She states she is having auditory hallucinations and they are command in nature.  She states, "they think it's because I've been doing drugs.  I did some cocaine yesterday."  Patient has refused to give urine samples for UDS.  Left cup in room and requested she give one when she is able to.  She is cooperative; pleasant.  She states she has not slept "in several days."  Her affect is flat and blunted.  Her mood is depressed.

## 2016-12-12 NOTE — ED Notes (Signed)
Pt has been having hallucinations of people coming to get her and states very minimal sleep for the past week. When asked about HI or SI, she stated, "I just don't know."

## 2016-12-13 LAB — I-STAT BETA HCG BLOOD, ED (MC, WL, AP ONLY)

## 2017-01-06 ENCOUNTER — Ambulatory Visit: Payer: Self-pay | Admitting: Family Medicine

## 2017-01-31 ENCOUNTER — Ambulatory Visit: Payer: Self-pay | Admitting: Family Medicine

## 2017-09-21 ENCOUNTER — Encounter (HOSPITAL_COMMUNITY): Payer: Self-pay | Admitting: Emergency Medicine

## 2017-09-21 ENCOUNTER — Emergency Department (HOSPITAL_COMMUNITY)
Admission: EM | Admit: 2017-09-21 | Discharge: 2017-09-21 | Disposition: A | Discharge status: Home / Self Care | Payer: Medicaid Other | Attending: Emergency Medicine | Admitting: Emergency Medicine

## 2017-09-21 ENCOUNTER — Other Ambulatory Visit: Payer: Self-pay

## 2017-09-21 ENCOUNTER — Emergency Department (HOSPITAL_COMMUNITY): Payer: Medicaid Other

## 2017-09-21 DIAGNOSIS — R05 Cough: Secondary | ICD-10-CM | POA: Diagnosis present

## 2017-09-21 DIAGNOSIS — F17210 Nicotine dependence, cigarettes, uncomplicated: Secondary | ICD-10-CM | POA: Diagnosis not present

## 2017-09-21 DIAGNOSIS — J45909 Unspecified asthma, uncomplicated: Secondary | ICD-10-CM | POA: Insufficient documentation

## 2017-09-21 DIAGNOSIS — J111 Influenza due to unidentified influenza virus with other respiratory manifestations: Secondary | ICD-10-CM | POA: Diagnosis not present

## 2017-09-21 LAB — CBC WITH DIFFERENTIAL/PLATELET
Basophils Absolute: 0 10*3/uL (ref 0.0–0.1)
Basophils Relative: 0 %
EOS ABS: 0.1 10*3/uL (ref 0.0–0.7)
Eosinophils Relative: 1 %
HEMATOCRIT: 34.9 % — AB (ref 36.0–46.0)
Hemoglobin: 10.5 g/dL — ABNORMAL LOW (ref 12.0–15.0)
LYMPHS ABS: 2.1 10*3/uL (ref 0.7–4.0)
Lymphocytes Relative: 23 %
MCH: 21.3 pg — ABNORMAL LOW (ref 26.0–34.0)
MCHC: 30.1 g/dL (ref 30.0–36.0)
MCV: 70.8 fL — ABNORMAL LOW (ref 78.0–100.0)
Monocytes Absolute: 0.9 10*3/uL (ref 0.1–1.0)
Monocytes Relative: 10 %
NEUTROS ABS: 6.2 10*3/uL (ref 1.7–7.7)
Neutrophils Relative %: 66 %
Platelets: 285 10*3/uL (ref 150–400)
RBC: 4.93 MIL/uL (ref 3.87–5.11)
RDW: 18.5 % — AB (ref 11.5–15.5)
WBC: 9.3 10*3/uL (ref 4.0–10.5)

## 2017-09-21 LAB — COMPREHENSIVE METABOLIC PANEL
ALK PHOS: 56 U/L (ref 38–126)
ALT: 18 U/L (ref 14–54)
AST: 25 U/L (ref 15–41)
Albumin: 3.9 g/dL (ref 3.5–5.0)
Anion gap: 10 (ref 5–15)
BILIRUBIN TOTAL: 0.3 mg/dL (ref 0.3–1.2)
CALCIUM: 9.7 mg/dL (ref 8.9–10.3)
CO2: 21 mmol/L — ABNORMAL LOW (ref 22–32)
Chloride: 109 mmol/L (ref 101–111)
Creatinine, Ser: 0.79 mg/dL (ref 0.44–1.00)
GFR calc Af Amer: >60 mL/min (ref >60)
GFR calc non Af Amer: >60 mL/min (ref >60)
GLUCOSE: 93 mg/dL (ref 65–99)
Potassium: 3.6 mmol/L (ref 3.5–5.1)
Sodium: 140 mmol/L (ref 135–145)
TOTAL PROTEIN: 7.4 g/dL (ref 6.5–8.1)

## 2017-09-21 LAB — I-STAT CG4 LACTIC ACID, ED: Lactic Acid, Venous: 1.89 mmol/L (ref 0.5–1.9)

## 2017-09-21 LAB — I-STAT BETA HCG BLOOD, ED (MC, WL, AP ONLY)

## 2017-09-21 MED ORDER — IPRATROPIUM-ALBUTEROL 0.5-2.5 (3) MG/3ML IN SOLN
3.0000 mL | Freq: Once | RESPIRATORY_TRACT | Status: AC
  Administered 2017-09-21: 3 mL via RESPIRATORY_TRACT
  Filled 2017-09-21: qty 3

## 2017-09-21 MED ORDER — ACETAMINOPHEN 500 MG PO TABS
1000.0000 mg | ORAL_TABLET | Freq: Once | ORAL | Status: AC
  Administered 2017-09-21: 1000 mg via ORAL
  Filled 2017-09-21: qty 2

## 2017-09-21 MED ORDER — SODIUM CHLORIDE 0.9 % IV BOLUS
1000.0000 mL | Freq: Once | INTRAVENOUS | Status: AC
  Administered 2017-09-21: 1000 mL via INTRAVENOUS

## 2017-09-21 MED ORDER — BENZONATATE 100 MG PO CAPS: 100.0000 mg | ORAL_CAPSULE | Freq: Three times a day (TID) | ORAL | 0 refills | Status: DC

## 2017-09-21 MED ORDER — ALBUTEROL SULFATE (2.5 MG/3ML) 0.083% IN NEBU
2.5000 mg | INHALATION_SOLUTION | Freq: Once | RESPIRATORY_TRACT | Status: AC
  Administered 2017-09-21: 2.5 mg via RESPIRATORY_TRACT
  Filled 2017-09-21: qty 3

## 2017-09-21 MED ORDER — IBUPROFEN 800 MG PO TABS
800.0000 mg | ORAL_TABLET | Freq: Once | ORAL | Status: AC
  Administered 2017-09-21: 800 mg via ORAL
  Filled 2017-09-21: qty 1

## 2017-09-21 MED ORDER — ALBUTEROL SULFATE HFA 108 (90 BASE) MCG/ACT IN AERS
1.0000 | INHALATION_SPRAY | Freq: Four times a day (QID) | RESPIRATORY_TRACT | Status: DC
  Administered 2017-09-21: 1 via RESPIRATORY_TRACT
  Filled 2017-09-21: qty 6.7

## 2017-09-21 MED ORDER — OSELTAMIVIR PHOSPHATE 75 MG PO CAPS: 75.0000 mg | ORAL_CAPSULE | Freq: Two times a day (BID) | ORAL | 0 refills | Status: DC

## 2017-09-21 MED ORDER — IPRATROPIUM-ALBUTEROL 0.5-2.5 (3) MG/3ML IN SOLN: 3.0000 mL | Freq: Once | RESPIRATORY_TRACT | Status: DC

## 2017-09-21 MED ORDER — OSELTAMIVIR PHOSPHATE 75 MG PO CAPS
75.0000 mg | ORAL_CAPSULE | Freq: Once | ORAL | Status: AC
  Administered 2017-09-21: 75 mg via ORAL
  Filled 2017-09-21: qty 1

## 2017-09-21 NOTE — ED Notes (Signed)
Pt agitated, wanting to leave. Pt told we are just waiting on provider to finish d/c paperwork. Pt pulled her own iv line out. Catheter was intact upon assessment.

## 2017-09-21 NOTE — ED Triage Notes (Signed)
Patient to ED c/o generalized body aches, sore throat, fever, and SOB since yesterday. Tonsils red and enlarged, but patient talking in clear, full sentences, appears anxious in triage. Resp e/u. Denies cough.

## 2017-09-21 NOTE — ED Provider Notes (Signed)
Julie [name] is a 42 y.o. female, with a history of Asthma, presenting to the ED with congestion, cough, body aches, and fever.  HPI from Lenn Sink, PA-C: "42 year old female presents today with complaints of respiratory infection.  Patient notes that yesterday she developed rhinorrhea nasal congestion cough, body aches and fever.  She notes she has continued to experience the symptoms with extreme fatigue.  Patient denies any significant shortness of breath, denies any throat or facial pain, denies any chest pain or abdominal pain.  Patient reports a history of asthma, but denies any significant wheezing.  She denies any abdominal pain, urinary changes, rash.  She reports she did not get the flu vaccine this year.  She denies any close sick contacts."  Past Medical History:  Diagnosis Date  . Asthma   . Fracture, humerus closed, shaft 07/11/2014   left  . History of bronchitis     Physical Exam  BP 122/73   Pulse 94   Temp 99.3 F (37.4 C) (Oral)   Resp 18   Ht 5\' 10"  (1.778 m)   Wt 121.6 kg (268 lb)   LMP 09/21/2017 (Exact Date)   SpO2 98%   BMI 38.45 kg/m   Physical Exam  Constitutional: She appears well-developed and well-nourished. No distress.  HENT:  Head: Normocephalic and atraumatic.  Eyes: Conjunctivae are normal.  Neck: Neck supple.  Cardiovascular: Normal rate, regular rhythm, normal heart sounds and intact distal pulses.  Pulmonary/Chest: Effort normal. No respiratory distress. She has wheezes (mild, global, expiratory).  Patient shows no increased work of breathing.  She speaks in full sentences without difficulty.  Abdominal: Soft. There is no tenderness. There is no guarding.  Musculoskeletal: She exhibits no edema.  Lymphadenopathy:    She has no cervical adenopathy.  Neurological: She is alert.  Skin: Skin is warm and dry. She is not diaphoretic.  Psychiatric: She has a normal mood and affect. Her behavior is normal.  Nursing note and vitals  reviewed.   ED Course/Procedures     Procedures   Results for orders placed or performed during the hospital encounter of 09/21/17  Comprehensive metabolic panel  Result Value Ref Range   Sodium 140 135 - 145 mmol/L   Potassium 3.6 3.5 - 5.1 mmol/L   Chloride 109 101 - 111 mmol/L   CO2 21 (L) 22 - 32 mmol/L   Glucose, Bld 93 65 - 99 mg/dL   BUN <5 (L) 6 - 20 mg/dL   Creatinine, Ser 0.79 0.44 - 1.00 mg/dL   Calcium 9.7 8.9 - 10.3 mg/dL   Total Protein 7.4 6.5 - 8.1 g/dL   Albumin 3.9 3.5 - 5.0 g/dL   AST 25 15 - 41 U/L   ALT 18 14 - 54 U/L   Alkaline Phosphatase 56 38 - 126 U/L   Total Bilirubin 0.3 0.3 - 1.2 mg/dL   GFR calc non Af Amer >60 >60 mL/min   GFR calc Af Amer >60 >60 mL/min   Anion gap 10 5 - 15  CBC with Differential  Result Value Ref Range   WBC 9.3 4.0 - 10.5 K/uL   RBC 4.93 3.87 - 5.11 MIL/uL   Hemoglobin 10.5 (L) 12.0 - 15.0 g/dL   HCT 34.9 (L) 36.0 - 46.0 %   MCV 70.8 (L) 78.0 - 100.0 fL   MCH 21.3 (L) 26.0 - 34.0 pg   MCHC 30.1 30.0 - 36.0 g/dL   RDW 18.5 (H) 11.5 - 15.5 %  Platelets 285 150 - 400 K/uL   Neutrophils Relative % 66 %   Lymphocytes Relative 23 %   Monocytes Relative 10 %   Eosinophils Relative 1 %   Basophils Relative 0 %   Neutro Abs 6.2 1.7 - 7.7 K/uL   Lymphs Abs 2.1 0.7 - 4.0 K/uL   Monocytes Absolute 0.9 0.1 - 1.0 K/uL   Eosinophils Absolute 0.1 0.0 - 0.7 K/uL   Basophils Absolute 0.0 0.0 - 0.1 K/uL   RBC Morphology TARGET CELLS   Influenza panel by PCR (type A & B)  Result Value Ref Range   Influenza A By PCR NEGATIVE NEGATIVE   Influenza B By PCR NEGATIVE NEGATIVE  I-Stat CG4 Lactic Acid, ED  Result Value Ref Range   Lactic Acid, Venous 1.89 0.5 - 1.9 mmol/L  I-Stat beta hCG blood, ED  Result Value Ref Range   I-stat hCG, quantitative <5.0 <5 mIU/mL   Comment 3           Dg Chest 2 View  Result Date: 09/21/2017 CLINICAL DATA:  Shortness of breath, productive cough, fever, and chills. History of asthma. EXAM: CHEST  - 2 VIEW COMPARISON:  07/26/2015 and earlier FINDINGS: The cardiomediastinal silhouette is within normal limits. There is mild central airway thickening and mild asymmetric interstitial density in the left perihilar and left basilar regions, chronic and also seen on 2016 and 2013 radiographs. No confluent airspace opacity, overt edema, pleural effusion, or pneumothorax is identified. No acute osseous abnormality is seen. IMPRESSION: Chronic changes without evidence of acute abnormality. Electronically Signed   By: Logan Bores M.D.   On: 09/21/2017 18:53     EKG Interpretation  Date/Time:  Wednesday September 21 2017 17:52:17 EDT Ventricular Rate:  99 PR Interval:  124 QRS Duration: 86 QT Interval:  358 QTC Calculation: 459 R Axis:   -34 Text Interpretation:  Normal sinus rhythm Left axis deviation Abnormal ECG no significant change compared to Feb 2017 Confirmed by Sherwood Gambler [id number]) on 09/21/2017 9:08:31 PM       MDM   Clinical Course as of Sep 23 206  Wed Sep 21, 2017  2153 Took patient care handoff report from [employer], PA-C. Plan: Patient presentation suspicious for influenza.  DuoNeb and IV fluids pending.  Reevaluate and discharge.   [SJ]  2252 Patient states she feels much better.  No longer complains of shortness of breath.  Mild expiratory wheezes globally.  No increased work of breathing.  Speaks in full sentences without difficulty.   [SJ]    Clinical Course User Index [SJ] Jeric Slagel C, PA-C   Patient presents with congestion, cough, body aches, and fever.  Patient is nontoxic appearing, afebrile, not tachycardic on my exam, not tachypneic, not hypotensive, maintains SPO2 of 98-100% on room air, and is in no apparent distress.  No leukocytosis.  Patient's fever responding to antipyretics.  Improvement with conservative management here in the ED. Patient ambulated without complaints of difficulty breathing or chest pain and without evidence of distress. The patient was  given instructions for home care as well as return precautions. Patient voices understanding of these instructions, accepts the plan, and is comfortable with discharge.   Vitals:   09/21/17 1750 09/21/17 1753 09/21/17 2012 09/21/17 2143  BP: (!) 131/106  111/69 122/73  Pulse: (!) 108  (!) 109 94  Resp: 20   18  Temp: 100 F (37.8 C)  (!) 102.8 F (39.3 C) 99.3 F (37.4 C)  TempSrc: Oral  Oral Oral  SpO2: 98%  100% 98%  Weight:  121.6 kg (268 lb)    Height:  5\' 10"  (1.778 m)           Lorayne Bender, PA-C 09/22/17 0217    Little, Wenda Overland, MD 09/23/17 [phone]

## 2017-09-21 NOTE — ED Provider Notes (Signed)
[facility] EMERGENCY DEPARTMENT Provider Note   CSN: [id number] Arrival date & time: 09/21/17  1745  History   Chief Complaint Chief Complaint  Patient presents with  . Shortness of Breath  . Fever    HPI Julie [name] is a 42 y.o. female.  HPI   42 year old female presents today with complaints of respiratory infection.  Patient notes that yesterday she developed rhinorrhea nasal congestion cough, body aches and fever.  She notes she has continued to experience the symptoms with extreme fatigue.  Patient denies any significant shortness of breath, denies any throat or facial pain, denies any chest pain or abdominal pain.  Patient reports a history of asthma, but denies any significant wheezing.  She denies any abdominal pain, urinary changes, rash.  She reports she did not get the flu vaccine this year.  She denies any close sick contacts.   Past Medical History:  Diagnosis Date  . Asthma   . Fracture, humerus closed, shaft 07/11/2014   left  . History of bronchitis     Patient Active Problem List   Diagnosis Date Noted  . Cocaine use disorder, severe, dependence ([place]) 12/12/2016  . Cocaine abuse with cocaine-induced psychotic disorder, with delusions ([place]) 12/12/2016  . Open fracture of great toe of left foot 12/25/2015  . S/P ORIF (open reduction internal fixation) fracture 12/25/2015    Past Surgical History:  Procedure Laterality Date  . CESAREAN SECTION    . ECTOPIC PREGNANCY SURGERY    . INCISION AND DRAINAGE Left 12/25/2015   Procedure: INCISION AND DRAINAGE With  Amputation of distal tip of Left Great and Second Toe.;  Surgeon: Leandrew Koyanagi, MD;  Location: [facility];  Service: Orthopedics;  Laterality: Left;  . LIPOMA EXCISION     chest  . ORIF HUMERUS FRACTURE Left 07/15/2014   Procedure: OPEN REDUCTION INTERNAL FIXATION (ORIF) LEFT HUMERAL SHAFT ;  Surgeon: Renette Butters, MD;  Location: [facility];  Service: Orthopedics;   Laterality: Left;  . TUBAL LIGATION       OB History   None      Home Medications    Prior to Admission medications   Medication Sig Start Date End Date Taking? Authorizing Provider  ibuprofen (ADVIL,MOTRIN) 600 MG tablet Take 1 tablet (600 mg total) by mouth every 8 (eight) hours as needed. Patient not taking: Reported on 12/12/2016 10/29/16   Jola Schmidt, MD    Family History No family history on file.  Social History Social History   Tobacco Use  . Smoking status: Current Every Day Smoker    Years: 18.00    Types: Cigarettes  . Smokeless tobacco: Never Used  . Tobacco comment: 6 cig./day  Substance Use Topics  . Alcohol use: Yes    Comment: occasionally  . Drug use: Yes    Types: Marijuana, "Crack" cocaine, MDMA (Ecstacy)    Comment: Crack, THC, Molly     Allergies   Patient has no known allergies.   Review of Systems Review of Systems  All other systems reviewed and are negative.    Physical Exam Updated Vital Signs BP 111/69 (BP Location: Right Arm)   Pulse (!) 109   Temp (!) 102.8 F (39.3 C) (Oral)   Resp 20   Ht 5\' 10"  (1.778 m)   Wt 121.6 kg (268 lb)   LMP 09/21/2017 (Exact Date)   SpO2 100%   BMI 38.45 kg/m   Physical Exam  Constitutional: She is oriented  to person, place, and time. She appears well-developed and well-nourished.  HENT:  Head: Normocephalic and atraumatic.  Tonsils symmetrical bilateral no erythema, no exudate uvula midline rises with phonation  Eyes: Pupils are equal, round, and reactive to light. Conjunctivae are normal. Right eye exhibits no discharge. Left eye exhibits no discharge. No scleral icterus.  Neck: Normal range of motion. No JVD present. No tracheal deviation present.  Cardiovascular: Regular rhythm, normal heart sounds and intact distal pulses.  Pulmonary/Chest: Effort normal. No stridor. No respiratory distress. She has wheezes. She has no rales. She exhibits no tenderness.  Minor bilateral expiratory  wheeze wheeze and chest congestion  Abdominal: Soft. She exhibits no distension and no mass. There is no tenderness. There is no rebound and no guarding.  Neurological: She is alert and oriented to person, place, and time. Coordination normal.  Skin: Skin is warm.  Psychiatric: She has a normal mood and affect. Her behavior is normal. Judgment and thought content normal.  Nursing note and vitals reviewed.    ED Treatments / Results  Labs (all labs ordered are listed, but only abnormal results are displayed) Labs Reviewed  COMPREHENSIVE METABOLIC PANEL - Abnormal; Notable for the following components:      Result Value   CO2 21 (*)    BUN <5 (*)    All other components within normal limits  CBC WITH DIFFERENTIAL/PLATELET - Abnormal; Notable for the following components:   Hemoglobin 10.5 (*)    HCT 34.9 (*)    MCV 70.8 (*)    MCH 21.3 (*)    RDW 18.5 (*)    All other components within normal limits  URINALYSIS, ROUTINE W REFLEX MICROSCOPIC  I-STAT CG4 LACTIC ACID, ED  I-STAT BETA HCG BLOOD, ED ([facility], [facility], [facility] ONLY)    EKG None  Radiology Dg Chest 2 View  Result Date: 09/21/2017 CLINICAL DATA:  Shortness of breath, productive cough, fever, and chills. History of asthma. EXAM: CHEST - 2 VIEW COMPARISON:  07/26/2015 and earlier FINDINGS: The cardiomediastinal silhouette is within normal limits. There is mild central airway thickening and mild asymmetric interstitial density in the left perihilar and left basilar regions, chronic and also seen on 2016 and 2013 radiographs. No confluent airspace opacity, overt edema, pleural effusion, or pneumothorax is identified. No acute osseous abnormality is seen. IMPRESSION: Chronic changes without evidence of acute abnormality. Electronically Signed   By: Logan Bores M.D.   On: 09/21/2017 18:53    Procedures Procedures (including critical care time)  Medications Ordered in ED Medications  sodium chloride 0.9 % bolus 1,000 mL (has no  administration in time range)  ibuprofen (ADVIL,MOTRIN) tablet 800 mg (has no administration in time range)  ipratropium-albuterol (DUONEB) 0.5-2.5 (3) MG/3ML nebulizer solution 3 mL (has no administration in time range)  acetaminophen (TYLENOL) tablet 1,000 mg (1,000 mg Oral Given 09/21/17 2029)     Initial Impression / Assessment and Plan / ED Course  I have reviewed the triage vital signs and the nursing notes.  Pertinent labs & imaging results that were available during my care of the patient were reviewed by me and considered in my medical decision making (see chart for details).  Clinical Course as of Sep 22 1512  Wed Sep 21, 2017  2153 Took patient care handoff report from [facility], PA-C. Plan: Patient presentation suspicious for influenza.  DuoNeb and IV fluids pending.  Reevaluate and discharge.   [SJ]  2252 Patient states she feels much better.  No longer complains  of shortness of breath.  Mild expiratory wheezes globally.  No increased work of breathing.  Speaks in full sentences without difficulty.   [SJ]    Clinical Course User Index [SJ] Joy, Shawn C, PA-C    Final Clinical Impressions(s) / ED Diagnoses   Final diagnoses:  Influenza    Labs: I-STAT lactic acid, i-STAT beta-hCG, CMP, CBC  Imaging: DG chest 2 view /ED EKG  Consults:  Therapeutics:  Discharge Meds:   Assessment/Plan: 10 YOF presents today with likely influenza. Pt with acute URI symptoms and fever. Pt has some wheezing on exam, no respiratory distress, reassuring chest x ray. No elevation in white count, no elevation in lactic acid, soft abdomen with no acute findings consistent with bacterial infection. Pt's fever and tachycardia improved with tylenol/ibuprofen. Pt will receive another breathing treatment, fluids and will be reassessed. Care with be handed off to oncoming provider pending breathing treatment and likely disposition. Mrs Bahner has no signs of respiraotry distress and will likely do  well with albuterol at home with prescription of tamiflu. I have discussed return precautions with her.     ED Discharge Orders    None       Okey Regal, Hershal Coria 09/22/17 [place], MD 09/22/17 2300

## 2017-09-21 NOTE — ED Notes (Signed)
Called main lab multiple times, spoke to rea who stated to keep calling [facility]. No response from [facility] to see if they received flu swab

## 2017-09-21 NOTE — Discharge Instructions (Addendum)
Please read attached information. If you experience any new or worsening signs or symptoms please return to the emergency room for evaluation. Please follow-up with your primary care provider or specialist as discussed. Please use medication prescribed only as directed and discontinue taking if you have any concerning signs or symptoms.    Your symptoms are consistent with a viral illness. Viruses do not require antibiotics. Treatment is symptomatic care and it is important to note that these symptoms may last for 7-14 days.   Hand washing: Wash your hands throughout the day, but especially before and after touching the face, using the restroom, sneezing, coughing, or touching surfaces that have been coughed or sneezed upon. Hydration: Symptoms will be intensified and complicated by dehydration. Dehydration can also extend the duration of symptoms. Drink plenty of fluids and get plenty of rest. You should be drinking at least half a liter of water an hour to stay hydrated. Electrolyte drinks (ex. Gatorade, Powerade, Pedialyte) are also encouraged. You should be drinking enough fluids to make your urine light yellow, almost clear. If this is not the case, you are not drinking enough water. Please note that some of the treatments indicated below will not be effective if you are not adequately hydrated. Diet: Please concentrate on hydration, however, you may introduce food slowly.  Start with a clear liquid diet, progressed to a full liquid diet, and then bland solids as you are able. Pain or fever: Ibuprofen, Naproxen, or Tylenol for pain or fever.  Cough: Use the Tessalon for cough.  Albuterol: May use the albuterol as needed for instances of shortness of breath. Flonase: Use this medication, as directed, for nasal and sinus congestion. Congestion: Plain Mucinex may help relieve congestion. Saline sinus rinses and saline nasal sprays may also help relieve congestion. If you do not have heart problems or  an allergy to such medications, you may also try phenylephrine or Sudafed. Sore throat: Warm liquids or Chloraseptic spray may help soothe a sore throat. Gargle twice a day with a salt water solution made from a half teaspoon of salt in a cup of warm water.  Follow up: Follow up with a primary care provider, as needed, for any future management of this issue. Return: Return to the ED for worsening shortness of breath, chest pain, fever over 100.3 F that does not respond to medications, or any other major concerns.

## 2017-09-22 LAB — INFLUENZA PANEL BY PCR (TYPE A & B)
Influenza A By PCR: NEGATIVE
Influenza B By PCR: NEGATIVE

## 2018-01-01 ENCOUNTER — Encounter (HOSPITAL_COMMUNITY): Payer: Self-pay | Admitting: Emergency Medicine

## 2018-01-01 ENCOUNTER — Emergency Department (HOSPITAL_COMMUNITY)
Admission: EM | Admit: 2018-01-01 | Discharge: 2018-01-02 | Disposition: A | Discharge status: Home / Self Care | Payer: Medicaid Other | Attending: Emergency Medicine | Admitting: Emergency Medicine

## 2018-01-01 ENCOUNTER — Other Ambulatory Visit: Payer: Self-pay

## 2018-01-01 DIAGNOSIS — N760 Acute vaginitis: Secondary | ICD-10-CM | POA: Diagnosis not present

## 2018-01-01 DIAGNOSIS — Z79899 Other long term (current) drug therapy: Secondary | ICD-10-CM | POA: Insufficient documentation

## 2018-01-01 DIAGNOSIS — B9689 Other specified bacterial agents as the cause of diseases classified elsewhere: Secondary | ICD-10-CM

## 2018-01-01 DIAGNOSIS — F17210 Nicotine dependence, cigarettes, uncomplicated: Secondary | ICD-10-CM | POA: Diagnosis not present

## 2018-01-01 DIAGNOSIS — R300 Dysuria: Secondary | ICD-10-CM | POA: Diagnosis present

## 2018-01-01 DIAGNOSIS — J45909 Unspecified asthma, uncomplicated: Secondary | ICD-10-CM | POA: Diagnosis not present

## 2018-01-01 HISTORY — DX: Nonpsychotic mental disorder, unspecified: F48.9

## 2018-01-01 NOTE — ED Triage Notes (Signed)
C/o burning with urination that started this morning and now reports constant burning to perineum.  Also reports urinary incontinence.

## 2018-01-02 LAB — WET PREP, GENITAL
Sperm: NONE SEEN
TRICH WET PREP: NONE SEEN
YEAST WET PREP: NONE SEEN

## 2018-01-02 LAB — URINALYSIS, ROUTINE W REFLEX MICROSCOPIC
Bilirubin Urine: NEGATIVE
Glucose, UA: NEGATIVE mg/dL
HGB URINE DIPSTICK: NEGATIVE
Ketones, ur: NEGATIVE mg/dL
LEUKOCYTES UA: NEGATIVE
NITRITE: NEGATIVE
Protein, ur: NEGATIVE mg/dL
Specific Gravity, Urine: 1.032 — ABNORMAL HIGH (ref 1.005–1.030)
pH: 5 (ref 5.0–8.0)

## 2018-01-02 LAB — I-STAT BETA HCG BLOOD, ED (MC, WL, AP ONLY)

## 2018-01-02 LAB — GC/CHLAMYDIA PROBE AMP (~~LOC~~) NOT AT ARMC
Chlamydia: NEGATIVE
Neisseria Gonorrhea: NEGATIVE

## 2018-01-02 MED ORDER — METRONIDAZOLE 500 MG PO TABS: 500.0000 mg | ORAL_TABLET | Freq: Two times a day (BID) | ORAL | 0 refills | Status: DC

## 2018-01-02 NOTE — ED Notes (Signed)
Patient taken to shower room and given soap and towels per her request.  Leaving after shower.

## 2018-01-02 NOTE — ED Provider Notes (Signed)
[facility] EMERGENCY DEPARTMENT Provider Note   CSN: [id number] Arrival date & time: 01/01/18  2327     History   Chief Complaint Chief Complaint  Patient presents with  . vaginal burning  . Urinary Incontinence    HPI Julie [name] is a 42 y.o. female.  Patient presents to the emergency department with a chief complaint of vaginal burning and dysuria.  She states that she noticed the symptoms yesterday.  She denies any fever, chills, nausea, vomiting.  She denies any abdominal pain.  She has not taken anything for her symptoms.  She denies seeing any open wounds or sores.  The history is provided by the patient. No language interpreter was used.    Past Medical History:  Diagnosis Date  . Asthma   . Fracture, humerus closed, shaft 07/11/2014   left  . History of bronchitis   . Mental health problem     Patient Active Problem List   Diagnosis Date Noted  . Cocaine use disorder, severe, dependence ([place]) 12/12/2016  . Cocaine abuse with cocaine-induced psychotic disorder, with delusions ([place]) 12/12/2016  . Open fracture of great toe of left foot 12/25/2015  . S/P ORIF (open reduction internal fixation) fracture 12/25/2015    Past Surgical History:  Procedure Laterality Date  . CESAREAN SECTION    . ECTOPIC PREGNANCY SURGERY    . INCISION AND DRAINAGE Left 12/25/2015   Procedure: INCISION AND DRAINAGE With  Amputation of distal tip of Left Great and Second Toe.;  Surgeon: Leandrew Koyanagi, MD;  Location: [facility];  Service: Orthopedics;  Laterality: Left;  . LIPOMA EXCISION     chest  . ORIF HUMERUS FRACTURE Left 07/15/2014   Procedure: OPEN REDUCTION INTERNAL FIXATION (ORIF) LEFT HUMERAL SHAFT ;  Surgeon: Renette Butters, MD;  Location: [facility];  Service: Orthopedics;  Laterality: Left;  . TUBAL LIGATION       OB History   None      Home Medications    Prior to Admission medications   Medication Sig Start Date End Date  Taking? Authorizing Provider  ARIPiprazole (ABILIFY) 10 MG tablet Take 10 mg by mouth 2 (two) times daily. 10/12/17  Yes [provider]  QUEtiapine (SEROQUEL) 25 MG tablet Take 25 mg by mouth 2 (two) times daily. 11/04/17  Yes [provider]  benzonatate (TESSALON) 100 MG capsule Take 1 capsule (100 mg total) by mouth every 8 (eight) hours. Patient not taking: Reported on 01/02/2018 09/21/17   Joy, Helane Gunther, PA-C  ibuprofen (ADVIL,MOTRIN) 600 MG tablet Take 1 tablet (600 mg total) by mouth every 8 (eight) hours as needed. Patient not taking: Reported on 12/12/2016 10/29/16   Jola Schmidt, MD  oseltamivir (TAMIFLU) 75 MG capsule Take 1 capsule (75 mg total) by mouth every 12 (twelve) hours. Patient not taking: Reported on 01/02/2018 09/21/17   Okey Regal, PA-C    Family History No family history on file.  Social History Social History   Tobacco Use  . Smoking status: Current Every Day Smoker    Years: 18.00    Types: Cigarettes  . Smokeless tobacco: Never Used  . Tobacco comment: 6 cig./day  Substance Use Topics  . Alcohol use: Yes    Comment: occasionally  . Drug use: Yes    Types: Marijuana, "Crack" cocaine, MDMA (Ecstacy)    Comment: Crack, THC, Molly     Allergies   Patient has no known allergies.   Review of Systems  Review of Systems  All other systems reviewed and are negative.    Physical Exam Updated Vital Signs BP 112/73   Pulse 65   Temp 99 F (37.2 C) (Oral)   Resp 16   LMP 10/19/2017   SpO2 97%   Physical Exam  Constitutional: She is oriented to person, place, and time. She appears well-developed and well-nourished.  HENT:  Head: Normocephalic and atraumatic.  Eyes: Pupils are equal, round, and reactive to light. Conjunctivae and EOM are normal.  Neck: Normal range of motion. Neck supple.  Cardiovascular: Normal rate and regular rhythm. Exam reveals no gallop and no friction rub.  No murmur heard. Pulmonary/Chest: Effort normal and  breath sounds normal. No respiratory distress. She has no wheezes. She has no rales. She exhibits no tenderness.  Abdominal: Soft. Bowel sounds are normal. She exhibits no distension and no mass. There is no tenderness. There is no rebound and no guarding.  Genitourinary:  Genitourinary Comments: Chaperone present for exam Exam performed by Rodell Perna PA-S under my direction. Mild white vaginal discharge No wounds or sores  Musculoskeletal: Normal range of motion. She exhibits no edema or tenderness.  Neurological: She is alert and oriented to person, place, and time.  Skin: Skin is warm and dry.  Psychiatric: She has a normal mood and affect. Her behavior is normal. Judgment and thought content normal.  Nursing note and vitals reviewed.    ED Treatments / Results  Labs (all labs ordered are listed, but only abnormal results are displayed) Labs Reviewed  URINALYSIS, ROUTINE W REFLEX MICROSCOPIC - Abnormal; Notable for the following components:      Result Value   APPearance HAZY (*)    Specific Gravity, Urine 1.032 (*)    All other components within normal limits  WET PREP, GENITAL  I-STAT BETA HCG BLOOD, ED ([facility], [facility], [facility] ONLY)  GC/CHLAMYDIA PROBE AMP ([place]) NOT AT [facility]    EKG None  Radiology No results found.  Procedures Procedures (including critical care time)  Medications Ordered in ED Medications - No data to display   Initial Impression / Assessment and Plan / ED Course  I have reviewed the triage vital signs and the nursing notes.  Pertinent labs & imaging results that were available during my care of the patient were reviewed by me and considered in my medical decision making (see chart for details).     Patient with vaginal burning and irritation.  Urinalysis is unremarkable.  Pregnancy test negative.  Clue cells seen on wet prep.  Will treat for bacterial vaginosis.  Gonorrhea and Chlamydia cultures pending.  Final Clinical Impressions(s) / ED  Diagnoses   Final diagnoses:  BV (bacterial vaginosis)    ED Discharge Orders        Ordered    metroNIDAZOLE (FLAGYL) 500 MG tablet  2 times daily     01/02/18 0420       Montine Circle, PA-C 01/02/18 0421    Leonette Monarch Grayce Sessions, MD 01/02/18 1755

## 2018-02-02 ENCOUNTER — Ambulatory Visit (INDEPENDENT_AMBULATORY_CARE_PROVIDER_SITE_OTHER): Payer: Self-pay | Admitting: Physician Assistant

## 2018-03-01 ENCOUNTER — Ambulatory Visit (INDEPENDENT_AMBULATORY_CARE_PROVIDER_SITE_OTHER): Payer: Self-pay | Admitting: Physician Assistant

## 2018-09-06 ENCOUNTER — Ambulatory Visit: Payer: Self-pay | Admitting: Nurse Practitioner

## 2018-12-19 ENCOUNTER — Other Ambulatory Visit: Payer: Self-pay

## 2018-12-19 ENCOUNTER — Ambulatory Visit: Payer: Medicaid Other | Attending: Nurse Practitioner | Admitting: Nurse Practitioner

## 2019-02-18 ENCOUNTER — Emergency Department (HOSPITAL_COMMUNITY): Payer: Medicaid Other

## 2019-02-18 ENCOUNTER — Encounter (HOSPITAL_COMMUNITY): Payer: Self-pay | Admitting: Emergency Medicine

## 2019-02-18 ENCOUNTER — Emergency Department (HOSPITAL_COMMUNITY)
Admission: EM | Admit: 2019-02-18 | Discharge: 2019-02-18 | Disposition: A | Discharge status: Home / Self Care | Payer: Medicaid Other | Attending: Emergency Medicine | Admitting: Emergency Medicine

## 2019-02-18 ENCOUNTER — Other Ambulatory Visit: Payer: Self-pay

## 2019-02-18 DIAGNOSIS — N83291 Other ovarian cyst, right side: Secondary | ICD-10-CM | POA: Diagnosis not present

## 2019-02-18 DIAGNOSIS — N838 Other noninflammatory disorders of ovary, fallopian tube and broad ligament: Secondary | ICD-10-CM

## 2019-02-18 DIAGNOSIS — F17210 Nicotine dependence, cigarettes, uncomplicated: Secondary | ICD-10-CM | POA: Insufficient documentation

## 2019-02-18 DIAGNOSIS — B373 Candidiasis of vulva and vagina: Secondary | ICD-10-CM | POA: Diagnosis not present

## 2019-02-18 DIAGNOSIS — Z975 Presence of (intrauterine) contraceptive device: Secondary | ICD-10-CM | POA: Insufficient documentation

## 2019-02-18 DIAGNOSIS — J45909 Unspecified asthma, uncomplicated: Secondary | ICD-10-CM | POA: Insufficient documentation

## 2019-02-18 DIAGNOSIS — R102 Pelvic and perineal pain: Secondary | ICD-10-CM | POA: Diagnosis not present

## 2019-02-18 DIAGNOSIS — B3731 Acute candidiasis of vulva and vagina: Secondary | ICD-10-CM

## 2019-02-18 DIAGNOSIS — N899 Noninflammatory disorder of vagina, unspecified: Secondary | ICD-10-CM | POA: Diagnosis present

## 2019-02-18 LAB — CBC WITH DIFFERENTIAL/PLATELET
Abs Immature Granulocytes: 0.03 10*3/uL (ref 0.00–0.07)
Basophils Absolute: 0 10*3/uL (ref 0.0–0.1)
Basophils Relative: 0 %
Eosinophils Absolute: 0.2 10*3/uL (ref 0.0–0.5)
Eosinophils Relative: 2 %
HCT: 38.1 % (ref 36.0–46.0)
Hemoglobin: 11.1 g/dL — ABNORMAL LOW (ref 12.0–15.0)
Immature Granulocytes: 0 %
Lymphocytes Relative: 29 %
Lymphs Abs: 2.8 10*3/uL (ref 0.7–4.0)
MCH: 22.2 pg — ABNORMAL LOW (ref 26.0–34.0)
MCHC: 29.1 g/dL — ABNORMAL LOW (ref 30.0–36.0)
MCV: 76 fL — ABNORMAL LOW (ref 80.0–100.0)
Monocytes Absolute: 0.8 10*3/uL (ref 0.1–1.0)
Monocytes Relative: 8 %
Neutro Abs: 5.8 10*3/uL (ref 1.7–7.7)
Neutrophils Relative %: 61 %
Platelets: 260 10*3/uL (ref 150–400)
RBC: 5.01 MIL/uL (ref 3.87–5.11)
RDW: 20.8 % — ABNORMAL HIGH (ref 11.5–15.5)
WBC: 9.6 10*3/uL (ref 4.0–10.5)
nRBC: 0 % (ref 0.0–0.2)

## 2019-02-18 LAB — COMPREHENSIVE METABOLIC PANEL
ALT: 22 U/L (ref 0–44)
AST: 27 U/L (ref 15–41)
Albumin: 3.2 g/dL — ABNORMAL LOW (ref 3.5–5.0)
Alkaline Phosphatase: 50 U/L (ref 38–126)
Anion gap: 10 (ref 5–15)
BUN: 8 mg/dL (ref 6–20)
CO2: 24 mmol/L (ref 22–32)
Calcium: 9.9 mg/dL (ref 8.9–10.3)
Chloride: 104 mmol/L (ref 98–111)
Creatinine, Ser: 0.79 mg/dL (ref 0.44–1.00)
GFR calc Af Amer: >60 mL/min (ref >60)
GFR calc non Af Amer: >60 mL/min (ref >60)
Glucose, Bld: 227 mg/dL — ABNORMAL HIGH (ref 70–99)
Potassium: 3.7 mmol/L (ref 3.5–5.1)
Sodium: 138 mmol/L (ref 135–145)
Total Bilirubin: 0.4 mg/dL (ref 0.3–1.2)
Total Protein: 6.7 g/dL (ref 6.5–8.1)

## 2019-02-18 LAB — WET PREP, GENITAL
Clue Cells Wet Prep HPF POC: NONE SEEN
Sperm: NONE SEEN
Trich, Wet Prep: NONE SEEN

## 2019-02-18 LAB — URINALYSIS, ROUTINE W REFLEX MICROSCOPIC
Bilirubin Urine: NEGATIVE
Glucose, UA: >=500 mg/dL — AB
Hgb urine dipstick: NEGATIVE
Ketones, ur: NEGATIVE mg/dL
Nitrite: NEGATIVE
Protein, ur: NEGATIVE mg/dL
RBC / HPF: >50 RBC/hpf — ABNORMAL HIGH (ref 0–5)
Specific Gravity, Urine: 1.021 (ref 1.005–1.030)
pH: 5 (ref 5.0–8.0)

## 2019-02-18 LAB — POC URINE PREG, ED: Preg Test, Ur: NEGATIVE

## 2019-02-18 MED ORDER — CEFTRIAXONE SODIUM 250 MG IJ SOLR
250.0000 mg | Freq: Once | INTRAMUSCULAR | Status: AC
  Administered 2019-02-18: 250 mg via INTRAMUSCULAR
  Filled 2019-02-18: qty 250

## 2019-02-18 MED ORDER — FLUCONAZOLE 150 MG PO TABS
150.0000 mg | ORAL_TABLET | Freq: Once | ORAL | Status: AC
  Administered 2019-02-18: 13:00:00 150 mg via ORAL
  Filled 2019-02-18 (×2): qty 1

## 2019-02-18 MED ORDER — LIDOCAINE HCL (PF) 1 % IJ SOLN
INTRAMUSCULAR | Status: AC
  Administered 2019-02-18: 0.9 mL
  Filled 2019-02-18: qty 30

## 2019-02-18 MED ORDER — IBUPROFEN 400 MG PO TABS
600.0000 mg | ORAL_TABLET | Freq: Once | ORAL | Status: AC
  Administered 2019-02-18: 600 mg via ORAL
  Filled 2019-02-18: qty 1

## 2019-02-18 MED ORDER — DOXYCYCLINE HYCLATE 100 MG PO TABS
100.0000 mg | ORAL_TABLET | Freq: Once | ORAL | Status: AC
  Administered 2019-02-18: 12:00:00 100 mg via ORAL
  Filled 2019-02-18: qty 1

## 2019-02-18 MED ORDER — DOXYCYCLINE HYCLATE 100 MG PO CAPS: 100.0000 mg | ORAL_CAPSULE | Freq: Two times a day (BID) | ORAL | 0 refills | Status: AC

## 2019-02-18 NOTE — ED Triage Notes (Signed)
EMS reports Pt feels like the IUD is now in vagina . Pt reports vag pain and dysuria. EMS vitals  bp 148/88, O2 sats 9u8 RA, CBG 299 ,T 97,7 oral.

## 2019-02-18 NOTE — Progress Notes (Signed)
CSW received consult for community resources do to economic impact of recent events. CSW provided patient with resources on community supports addressing food needs and reviewed resources and guidelines with patient. CSW noted patient identified one resource she had been involved with in the past and identified knowing how to reach out and contact the supports provided. CSW inquired if there were additional resources or supports that may be beneficial in additional to these resources and noted patient stated not at this time.  Lamonte Richer, LCSW, [place] Worker II [phone]

## 2019-02-18 NOTE — ED Provider Notes (Addendum)
[facility] EMERGENCY DEPARTMENT Provider Note   CSN: [id number] Arrival date & time: 02/18/19  [id number]     History   Chief Complaint Chief Complaint  Patient presents with  . Vaginal Discharge    HPI Julie [name] is a 43 y.o. female.     HPI   Julie [name] is a 43 y.o. female, with a history of tubal ligation, presenting to the ED with suspicion for IUD dislodgement today.  For the last three days she has been experiencing abnormal vaginal discharge that she suspected was from a yeast infection. She has also had vulvar irritation and itching with washing that usually resolves within about 30 minutes. No irritation otherwise. She has been experiencing intravaginal pressure for the last three days.  This morning, she was trying to urinate when she felt "an electric shock run through my body from my vaginal area." Her boyfriend checked inside her vagina and thinks he saw her IUD dislodged. IUD was placed in 2007.  Current discomfort is rated 7/10, pressure/burning, nonradiating.  LMP July 27.  Denies fever/chills, abdominal pain, N/V/D, syncope, dysuria, hematuria, vaginal bleeding, back/flank pain, or any other complaints.     Past Medical History:  Diagnosis Date  . Asthma   . Fracture, humerus closed, shaft 07/11/2014   left  . History of bronchitis   . Mental health problem     Patient Active Problem List   Diagnosis Date Noted  . Cocaine use disorder, severe, dependence ([place]) 12/12/2016  . Cocaine abuse with cocaine-induced psychotic disorder, with delusions ([place]) 12/12/2016  . Open fracture of great toe of left foot 12/25/2015  . S/P ORIF (open reduction internal fixation) fracture 12/25/2015    Past Surgical History:  Procedure Laterality Date  . CESAREAN SECTION    . ECTOPIC PREGNANCY SURGERY  2003  . INCISION AND DRAINAGE Left 12/25/2015   Procedure: INCISION AND DRAINAGE With  Amputation of distal tip of Left Great and Second Toe.;   Surgeon: Leandrew Koyanagi, MD;  Location: [facility];  Service: Orthopedics;  Laterality: Left;  . LIPOMA EXCISION     chest  . ORIF HUMERUS FRACTURE Left 07/15/2014   Procedure: OPEN REDUCTION INTERNAL FIXATION (ORIF) LEFT HUMERAL SHAFT ;  Surgeon: Renette Butters, MD;  Location: [facility];  Service: Orthopedics;  Laterality: Left;  . TUBAL LIGATION  2003     OB History   No obstetric history on file.      Home Medications    Prior to Admission medications   Medication Sig Start Date End Date Taking? Authorizing Provider  doxycycline (VIBRAMYCIN) 100 MG capsule Take 1 capsule (100 mg total) by mouth 2 (two) times daily for 14 days. 02/18/19 03/04/19  Lorayne Bender, PA-C    Family History History reviewed. No pertinent family history.  Social History Social History   Tobacco Use  . Smoking status: Current Every Day Smoker    Years: 18.00    Types: Cigarettes  . Smokeless tobacco: Never Used  . Tobacco comment: 6 cig./day  Substance Use Topics  . Alcohol use: Yes    Comment: occasionally  . Drug use: Yes    Types: Marijuana, "Crack" cocaine, MDMA (Ecstacy)    Comment: Crack, THC, Molly     Allergies   Patient has no known allergies.   Review of Systems Review of Systems  Constitutional: Negative for chills, diaphoresis and fever.  Respiratory: Negative for shortness of breath.   Cardiovascular: Negative for  chest pain.  Gastrointestinal: Negative for abdominal pain, diarrhea, nausea and vomiting.  Genitourinary: Positive for vaginal discharge and vaginal pain. Negative for dysuria, flank pain, frequency, hematuria and vaginal bleeding.  Musculoskeletal: Negative for back pain.  Neurological: Negative for syncope.  All other systems reviewed and are negative.    Physical Exam Updated Vital Signs BP (!) 140/104 (BP Location: Left Wrist)   Pulse 91   Temp 98 F (36.7 C) (Oral)   Resp 16   SpO2 100%   Physical Exam Vitals signs and nursing note  reviewed.  Constitutional:      General: She is not in acute distress.    Appearance: She is well-developed. She is not diaphoretic.  HENT:     Head: Normocephalic and atraumatic.     Mouth/Throat:     Mouth: Mucous membranes are moist.     Pharynx: Oropharynx is clear.  Eyes:     Conjunctiva/sclera: Conjunctivae normal.  Neck:     Musculoskeletal: Neck supple.  Cardiovascular:     Rate and Rhythm: Normal rate and regular rhythm.     Pulses: Normal pulses.          Radial pulses are 2+ on the right side and 2+ on the left side.       Posterior tibial pulses are 2+ on the right side and 2+ on the left side.     Heart sounds: Normal heart sounds.     Comments: Tactile temperature in the extremities appropriate and equal bilaterally. Pulmonary:     Effort: Pulmonary effort is normal. No respiratory distress.     Breath sounds: Normal breath sounds.  Abdominal:     Palpations: Abdomen is soft.     Tenderness: There is no guarding.     Comments: When the RLQ is palpated with deep palpation, patient states it sends pain into the vaginal area.  Genitourinary:    Comments: External genitalia with tenderness and swelling to bilateral vulva, worse on right. Vagina with discharge - white-green tinged discharge in vaginal vault Tenderness to vaginal walls Cervix  abnormal  - some erosion around cervical os positive for cervical motion tenderness Adnexa palpated, no masses, positive for tenderness noted on the right Bladder palpated negative for tenderness Uterus palpated no masses, positive for tenderness  No inguinal lymphadenopathy. Otherwise normal female genitalia. Med Tech, [place], served as Producer, television/film/video during exam. IUD nor the IUD string were noted during this exam. Musculoskeletal:     Right lower leg: No edema.     Left lower leg: No edema.  Lymphadenopathy:     Cervical: No cervical adenopathy.  Skin:    General: Skin is warm and dry.  Neurological:     Mental Status: She  is alert.  Psychiatric:        Mood and Affect: Mood and affect normal.        Speech: Speech normal.        Behavior: Behavior normal.      ED Treatments / Results  Labs (all labs ordered are listed, but only abnormal results are displayed) Labs Reviewed  URINE CULTURE - Abnormal; Notable for the following components:      Result Value   Culture MULTIPLE SPECIES PRESENT, SUGGEST RECOLLECTION (*)    All other components within normal limits  WET PREP, GENITAL - Abnormal; Notable for the following components:   Yeast Wet Prep HPF POC PRESENT (*)    WBC, Wet Prep HPF POC MODERATE (*)    All other  components within normal limits  URINALYSIS, ROUTINE W REFLEX MICROSCOPIC - Abnormal; Notable for the following components:   APPearance CLOUDY (*)    Glucose, UA >=500 (*)    Leukocytes,Ua LARGE (*)    RBC / HPF >50 (*)    Bacteria, UA RARE (*)    All other components within normal limits  COMPREHENSIVE METABOLIC PANEL - Abnormal; Notable for the following components:   Glucose, Bld 227 (*)    Albumin 3.2 (*)    All other components within normal limits  CBC WITH DIFFERENTIAL/PLATELET - Abnormal; Notable for the following components:   Hemoglobin 11.1 (*)    MCV 76.0 (*)    MCH 22.2 (*)    MCHC 29.1 (*)    RDW 20.8 (*)    All other components within normal limits  RPR  HIV ANTIBODY (ROUTINE TESTING W REFLEX)  POC URINE PREG, ED  GC/CHLAMYDIA PROBE AMP ([place]) NOT AT [facility]    EKG None  Radiology US Transvaginal Non-ob  Result Date: 02/18/2019 CLINICAL DATA:  Suprapubic and right adnexal pain. History of C-section. EXAM: TRANSABDOMINAL AND TRANSVAGINAL ULTRASOUND OF PELVIS DOPPLER ULTRASOUND OF OVARIES TECHNIQUE: Both transabdominal and transvaginal ultrasound examinations of the pelvis were performed. Transabdominal technique was performed for global imaging of the pelvis including uterus, ovaries, adnexal regions, and pelvic cul-de-sac. It was necessary to proceed with  endovaginal exam following the transabdominal exam to visualize the endometrium and ovaries. Color and duplex Doppler ultrasound was utilized to evaluate blood flow to the ovaries. COMPARISON:  None. FINDINGS: Uterus Measurements: 11.2 x 5.7 x 6.9 cm = volume: 231 mL. There is a 2.6 x 1.9 x 1.5 cm fibroid in the posterior fundus. The patient's C-section scar is seen in the lower uterine segment. Endometrium Thickness: 9.1 mm.  No focal abnormality visualized. Right ovary Measurements: 5.9 x 3.0 x 2.5 cm = volume: 23.3 mL. There is a septated, thick walled cystic mass with some mural nodularity. This mass measures 3.2 x 3.0 x 2.7 cm. Left ovary Not visualized. Pulsed Doppler evaluation of the right ovary demonstrates normal low-resistance arterial and venous waveforms. Other findings Trace fluid is seen in the cul-de-sac. IMPRESSION: Cysts 1. There is a complex cystic mass in the right ovary with a thickened wall, multiple septations, and mural nodularity. There is blood flow within the posterior thickened wall but not definitively within the nodularity. Recommend consultation with a gynecologist and a pelvic MRI for further evaluation. A neoplasm/malignancy is not excluded. 2. No torsion identified in the right ovary today's study. 3. The left ovary was not visualized. Electronically Signed   By: Dorise Bullion III M.D   On: 02/18/2019 12:19   US Pelvis Complete  Result Date: 02/18/2019 CLINICAL DATA:  Suprapubic and right adnexal pain. History of C-section. EXAM: TRANSABDOMINAL AND TRANSVAGINAL ULTRASOUND OF PELVIS DOPPLER ULTRASOUND OF OVARIES TECHNIQUE: Both transabdominal and transvaginal ultrasound examinations of the pelvis were performed. Transabdominal technique was performed for global imaging of the pelvis including uterus, ovaries, adnexal regions, and pelvic cul-de-sac. It was necessary to proceed with endovaginal exam following the transabdominal exam to visualize the endometrium and ovaries. Color  and duplex Doppler ultrasound was utilized to evaluate blood flow to the ovaries. COMPARISON:  None. FINDINGS: Uterus Measurements: 11.2 x 5.7 x 6.9 cm = volume: 231 mL. There is a 2.6 x 1.9 x 1.5 cm fibroid in the posterior fundus. The patient's C-section scar is seen in the lower uterine segment. Endometrium Thickness: 9.1 mm.  No  focal abnormality visualized. Right ovary Measurements: 5.9 x 3.0 x 2.5 cm = volume: 23.3 mL. There is a septated, thick walled cystic mass with some mural nodularity. This mass measures 3.2 x 3.0 x 2.7 cm. Left ovary Not visualized. Pulsed Doppler evaluation of the right ovary demonstrates normal low-resistance arterial and venous waveforms. Other findings Trace fluid is seen in the cul-de-sac. IMPRESSION: Cysts 1. There is a complex cystic mass in the right ovary with a thickened wall, multiple septations, and mural nodularity. There is blood flow within the posterior thickened wall but not definitively within the nodularity. Recommend consultation with a gynecologist and a pelvic MRI for further evaluation. A neoplasm/malignancy is not excluded. 2. No torsion identified in the right ovary today's study. 3. The left ovary was not visualized. Electronically Signed   By: Dorise Bullion III M.D   On: 02/18/2019 12:19   [place] Art/ven Flow Abd Pelv Doppler  Result Date: 02/18/2019 CLINICAL DATA:  Suprapubic and right adnexal pain. History of C-section. EXAM: TRANSABDOMINAL AND TRANSVAGINAL ULTRASOUND OF PELVIS DOPPLER ULTRASOUND OF OVARIES TECHNIQUE: Both transabdominal and transvaginal ultrasound examinations of the pelvis were performed. Transabdominal technique was performed for global imaging of the pelvis including uterus, ovaries, adnexal regions, and pelvic cul-de-sac. It was necessary to proceed with endovaginal exam following the transabdominal exam to visualize the endometrium and ovaries. Color and duplex Doppler ultrasound was utilized to evaluate blood flow to the ovaries.  COMPARISON:  None. FINDINGS: Uterus Measurements: 11.2 x 5.7 x 6.9 cm = volume: 231 mL. There is a 2.6 x 1.9 x 1.5 cm fibroid in the posterior fundus. The patient's C-section scar is seen in the lower uterine segment. Endometrium Thickness: 9.1 mm.  No focal abnormality visualized. Right ovary Measurements: 5.9 x 3.0 x 2.5 cm = volume: 23.3 mL. There is a septated, thick walled cystic mass with some mural nodularity. This mass measures 3.2 x 3.0 x 2.7 cm. Left ovary Not visualized. Pulsed Doppler evaluation of the right ovary demonstrates normal low-resistance arterial and venous waveforms. Other findings Trace fluid is seen in the cul-de-sac. IMPRESSION: Cysts 1. There is a complex cystic mass in the right ovary with a thickened wall, multiple septations, and mural nodularity. There is blood flow within the posterior thickened wall but not definitively within the nodularity. Recommend consultation with a gynecologist and a pelvic MRI for further evaluation. A neoplasm/malignancy is not excluded. 2. No torsion identified in the right ovary today's study. 3. The left ovary was not visualized. Electronically Signed   By: Dorise Bullion III M.D   On: 02/18/2019 12:19    Procedures Pelvic exam  Date/Time: 02/18/2019 10:13 AM Performed by: Lorayne Bender, PA-C Authorized by: Lorayne Bender, PA-C  Consent: Verbal consent obtained. Risks and benefits: risks, benefits and alternatives were discussed Consent given by: patient Patient identity confirmed: verbally with patient and provided demographic data Local anesthesia used: no  Anesthesia: Local anesthesia used: no  Sedation: Patient sedated: no  Patient tolerance: patient tolerated the procedure well with no immediate complications    (including critical care time)  Medications Ordered in ED Medications  ibuprofen (ADVIL) tablet 600 mg (600 mg Oral Given 02/18/19 1204)  cefTRIAXone (ROCEPHIN) injection 250 mg (250 mg Intramuscular Given 02/18/19  1213)  doxycycline (VIBRA-TABS) tablet 100 mg (100 mg Oral Given 02/18/19 1205)  fluconazole (DIFLUCAN) tablet 150 mg (150 mg Oral Given 02/18/19 1305)  lidocaine (PF) (XYLOCAINE) 1 % injection (0.9 mLs  Given 02/18/19 1212)  Initial Impression / Assessment and Plan / ED Course  I have reviewed the triage vital signs and the nursing notes.  Pertinent labs & imaging results that were available during my care of the patient were reviewed by me and considered in my medical decision making (see chart for details).  Clinical Course as of Feb 19 801  Sun Feb 18, 2019  0924 Erroneous reading.  Patient SPO2 97%.  No distress.  SpO2(!): 68 % [SJ]  [id number] Spoke with Dr. Roselie Awkward, OBGYN.  We discussed the patient's presentation, physical exam findings, and ultrasound results. He states this patient is appropriate for close follow-up in the clinic.  He will send an appropriate message to assist with scheduling. He agrees with treating the patient for PID with doxycycline for 14 days.  He expects her to be seen in the clinic prior to finishing this course.  No further imaging necessary here in ED.  Once they see the patient, they will decide if further imaging is required. We also discussed patient's original complaint in more detail regarding her IUD.  We discussed that the IUD was not noted on exam nor on ultrasound.  He states that since it has been quite a while since the patient had the IUD placed, it may have come out on its own.   [SJ]    Clinical Course User Index [SJ] Stefen Juba C, PA-C       Patient presents with lower abdominal/pelvic pain. Patient is nontoxic appearing, afebrile, not tachycardic, not tachypneic, not hypotensive, and is in no apparent distress.  She was noted to be stable throughout her ED course.  Pregnancy test negative. Due to the location of her tenderness and presence of CMT, patient will be treated for PID. My suspicion for other causes of patient's lower abdominal  discomfort, such as appendicitis, is low.  She is afebrile, has no GI symptoms, and no leukocytosis, despite 3 days of discomfort. Wet prep positive for yeast, patient treated in the ED. Ovarian mass noted on ultrasound, which was discussed with OB/GYN.  Patient will have close clinic follow-up. The patient was given instructions for home care as well as return precautions. Patient voices understanding of these instructions, accepts the plan, and is comfortable with discharge.  Note: Patient confided to the RN that she has had recent financial difficulties due to her husband losing his job.  A social work consult was placed to assist patient with further resources.  Final Clinical Impressions(s) / ED Diagnoses   Final diagnoses:  Pelvic pain  Yeast vaginitis  Ovarian mass, right    ED Discharge Orders         Ordered    doxycycline (VIBRAMYCIN) 100 MG capsule  2 times daily     02/18/19 [place], Dimetri Armitage C, PA-C 02/20/19 0806    Lorayne Bender, PA-C 02/20/19 0808    Deno Etienne, DO 02/20/19 1505

## 2019-02-18 NOTE — Discharge Instructions (Addendum)
There was evidence of vaginal yeast infection.  You have been treated for this with Diflucan.  Due to the location and type of tenderness you experienced during the exam, we will also treat you for possible pelvic inflammatory disease (PID).  There are STD tests pending.  Positive results will typically be called to you. Please take all of your antibiotics until finished!   You may develop abdominal discomfort or diarrhea from the antibiotic.  You may help offset this with probiotics which you can buy or get in yogurt. Do not eat or take the probiotics until 2 hours after your antibiotic.   Antiinflammatory medications: Take 600 mg of ibuprofen every 6 hours or 440 mg (over the counter dose) to 500 mg (prescription dose) of naproxen every 12 hours for the next 3 days. After this time, these medications may be used as needed for pain. Take these medications with food to avoid upset stomach. Choose only one of these medications, do not take them together. Acetaminophen (generic for Tylenol): Should you continue to have additional pain while taking the ibuprofen or naproxen, you may add in acetaminophen as needed. Your daily total maximum amount of acetaminophen from all sources should be limited to 4000mg /day for persons without liver problems, or 2000mg /day for those with liver problems.  There was a mass noted in the right ovary.  This will need to be evaluated further. Please follow-up with OB/GYN.  Call the number provided to set up an appointment.  Go to the emergency department listed in this packet should you experience increased pain, serious vaginal bleeding, passing out, dizziness, uncontrolled vomiting, or any other major concerns.

## 2019-02-19 LAB — HIV ANTIBODY (ROUTINE TESTING W REFLEX): HIV Screen 4th Generation wRfx: NONREACTIVE

## 2019-02-19 LAB — URINE CULTURE

## 2019-02-19 LAB — RPR: RPR Ser Ql: NONREACTIVE

## 2019-02-20 LAB — GC/CHLAMYDIA PROBE AMP (~~LOC~~) NOT AT ARMC
Chlamydia: NEGATIVE
Neisseria Gonorrhea: NEGATIVE

## 2019-03-02 ENCOUNTER — Telehealth: Payer: Self-pay | Admitting: Obstetrics & Gynecology

## 2019-03-02 NOTE — Telephone Encounter (Signed)
Called the patient to confirm the upcoming appointment. Informed the patient of wearing a face mask into the office and no visitors or children are allowed due to covid19 restrictions. The patient verbalized understanding and answered no to the covid19 screening questions.

## 2019-03-05 ENCOUNTER — Ambulatory Visit: Payer: Medicaid Other | Admitting: Clinical

## 2019-03-05 ENCOUNTER — Other Ambulatory Visit: Payer: Self-pay

## 2019-03-05 ENCOUNTER — Ambulatory Visit (INDEPENDENT_AMBULATORY_CARE_PROVIDER_SITE_OTHER): Payer: Medicaid Other | Admitting: Obstetrics & Gynecology

## 2019-03-05 ENCOUNTER — Encounter: Payer: Self-pay | Admitting: Obstetrics & Gynecology

## 2019-03-05 VITALS — BP 130/77 | HR 90 | Ht 71.0 in | Wt 343.0 lb

## 2019-03-05 DIAGNOSIS — N838 Other noninflammatory disorders of ovary, fallopian tube and broad ligament: Secondary | ICD-10-CM | POA: Diagnosis present

## 2019-03-05 DIAGNOSIS — Z8659 Personal history of other mental and behavioral disorders: Secondary | ICD-10-CM

## 2019-03-05 DIAGNOSIS — N921 Excessive and frequent menstruation with irregular cycle: Secondary | ICD-10-CM | POA: Diagnosis not present

## 2019-03-05 DIAGNOSIS — R102 Pelvic and perineal pain: Secondary | ICD-10-CM

## 2019-03-05 DIAGNOSIS — Z1331 Encounter for screening for depression: Secondary | ICD-10-CM | POA: Diagnosis not present

## 2019-03-05 NOTE — Patient Instructions (Signed)
Behavioral Health Resources:   What if I or someone I know is in crisis?  . If you are thinking about harming yourself or having thoughts of suicide, or if you know someone who is, seek help right away.  . Call your doctor or mental health care provider.  . Call 911 or go to a hospital emergency room to get immediate help, or ask a friend or family member to help you do these things.  . Call the [place] National Suicide Prevention Lifeline's toll-free, 24-hour hotline at 1-800-273-TALK (1-800-273-8255) or TTY: 1-800-799-4 TTY (1-800-799-4889) to talk to a trained counselor.  . If you are in crisis, make sure you are not left alone.   . If someone else is in crisis, make sure he or she is not left alone   24 Hour :   [place] National Suicide Hotline: 1-800-273-8255  Therapeutic Alternative Mobile Crisis: 1-877-626-1772   [facility]  [address], [place], [place] [address]  800-711-2635 or 336-832-9700  [facility] Crisis Line (Domestic Violence, Rape & Victim Assistance)  336-273-7273  [facility]  [address] [place], [place]  [address]   1-855-788-8787 or 336-676-6840   [facility] [place] [facility]: 336-899-1505 (8am-4pm) or 1-866- 261-5769 (after hours)           [facility], [address], [place], [place]  1-800-711-2635 Fax: 336-832-9701 [email or web address]  *Interpreters available *Accepts Medicaid, Medicare, uninsured  [facility]   Mon-Fri: 8am-5pm [address], [place], [place] 336-272-0855(phone); 336-272-9885(fax) www.[facility].com  *Accepts Medicare  [facility] Mon, Tues, Thurs, Fri: 8am-4pm [address], [place], [place]  336-334-5000 (phone); 336-256-0121 (fax) www.[facility].com  *Accepts Medicare  [facility] Mon-Fri: 9am-5pm  [address], [place], [place] 336-540-9400 (phone); 336-540-9454  www.[email or web address]  *Accepts Medicaid  [facility] [address], [place], [place]  336-271-5888  [email or web address]://[email or web address]   [facility] Mon-Fri, 8:30am-12pm/1pm-2:30pm [address], [place], [place] 336-387-6161 (phone); 336-387-9167 (fax) www.fspcares.org  *Accepts Medicaid, sliding-scale*Bilingual services available  [facility] Mon-Fri, 8am-7pm [address], [place], [place]  336-899-8800(phone); 336-899-8811(fax) www.[facility].org  *Accepts Medicaid *Bilingual services available  [facility] Mon-Fri: 8am-5pm, Saturday by appointment only [address], [place], [place] [phone] (phone); 336-292-6711 (fax) www.[email or web address]   [facility] [address], [place], [place] [phone] www.[email or web address]  *Free & reduced services for uninsured and underinsured individuals *Bilingual services for Spanish-speaking clients 21 and under  [facility], [address], [place], [place] 336-676-6409(phone); 336-676-6409(fax) www.[facility].org  *Bring your own interpreter at first visit *Accepts Medicare and Medicaid  [facility] Mon-Fri: 9am-5:30pm [address], [place], [place] [phone] (phone), 336-419-4488 (fax) After hours crisis line: 336-763-1165 www.[facility].com  *Accepts Medicare and Medicaid  [facility] Mon-Thurs, 8am-6pm [address], [place], [place]  [phone] (phone); 336-288-0738 (fax) http://[facility].org  *Subsidized costs available  [facility] Mon-Fri: 8am-4pm [address], [place],  [place] [phone](phone); 336-834-9698(fax) www.[facility].com  *Accepts Medicaid  [facility] [place] Same day access hours: Mon-Fri, 8:30-3pm Crisis hours: Mon-Fri, 8am-5pm [address], [place], [place]  [facility] Same day access hours: Mon-Fri, 8:30-3pm Crisis hours: Mon-Fri, 8am-8pm [address], [place],  [phone] (phone); 336-899-1513 (fax) www.[facility].org  *Accepts Medicaid and Medicare  [facility] Mon, Wed, Fri: 9am-9pm Tues, Thurs: 9am-6pm [address],   [place], [place]  [phone] (phone); [phone] (fax) https://[email or web address]  *(Accepts Medicare and Medicaid; payment plans available)*Bilingual services available  [facility] [address], [place], [place] 336-272-1182 (phone); 336-272-1182 (fax) www.[facility].com   [facility] [address], [place], [place]  [phone]  www.[email or web address]  *Bilingual services available  [facility] Mon-Thurs: 8am-8pm [address], [place], [place] [phone] (phone); 336-285-7174 (fax) https://[email or web address]/[email or web address]  *Accepts Medicaid*Bilingual services available  [facility] [address], [place], [place] [phone] (phone) https://[email or web address]  *Accepts Medicaid *Bilingual services available  [facility] Mon-Fri, 9am-4:45pm [address], [place], [place] [phone] (phone); 336-450-4318 (fax) http://[email or web address]-[email or web address]  *Accepts Medicare  [facility] Mon-Thurs: 8:30-8pm, Fri: 8:30am-7pm [address], [place], [place] (3rd floor) [phone] (phone); 336-334-5754 (fax) http://[email or web address]  *Accepts Medicaid; income-based reduced rates available  [facility] Mon-Fri: 8am-5pm [address], [place],  [place] [phone] (phone); [phone] (fax) http://www.[email or web address]  *Accepts Medicaid*Bilingual services available  [facility] [address], [place], [place]  [phone] (phone); [phone] (fax) www.[facility].org  *Free emergency housing and clinical services for youth in crisis  [facility] [place]  [address], [place] 336-373-1402 www.mhag.org  *Provides direct services to individuals in recovery from mental illness, including support groups, recovery skills classes, and one on one peer support  NAMI (National Alliance on Mental Illness) [place] NAMI helpline: 336-370-4264  https://namiguilford.org  *A community hub for information relating to local resources and services for the friends and families of individuals living alongside a mental health condition, as well as the individuals themselves. Classes and support groups also provided     

## 2019-03-05 NOTE — Progress Notes (Signed)
Patient ID: Julie [name], female   DOB: 1975-08-16, 43 y.o.   MRN: [id number]  Chief Complaint  Patient presents with  . Follow-up    from ER  . Pelvic Pain  right ovarian mass  HPI Julie [name] is a 43 y.o. female.  No obstetric history on file. Patient's last menstrual period was 02/21/2019 (within days). She has an IUD 2007 but was not present on [place] 8/30 which did show right ovarian complex mass. She presented to ED with pelvic pain and her last period was heavy and lasted 10 days. Still some pelvic pain. H/o drug abuse and chronic pain. She has a referral to pain management HPI  Past Medical History:  Diagnosis Date  . Asthma   . Fracture, humerus closed, shaft 07/11/2014   left  . History of bronchitis   . Mental health problem     Past Surgical History:  Procedure Laterality Date  . CESAREAN SECTION    . ECTOPIC PREGNANCY SURGERY  2003  . INCISION AND DRAINAGE Left 12/25/2015   Procedure: INCISION AND DRAINAGE With  Amputation of distal tip of Left Great and Second Toe.;  Surgeon: Leandrew Koyanagi, MD;  Location: [facility];  Service: Orthopedics;  Laterality: Left;  . LIPOMA EXCISION     chest  . ORIF HUMERUS FRACTURE Left 07/15/2014   Procedure: OPEN REDUCTION INTERNAL FIXATION (ORIF) LEFT HUMERAL SHAFT ;  Surgeon: Renette Butters, MD;  Location: [place];  Service: Orthopedics;  Laterality: Left;  . TUBAL LIGATION  2003    No family history on file.  Social History Social History   Tobacco Use  . Smoking status: Current Every Day Smoker    Years: 18.00    Types: Cigarettes  . Smokeless tobacco: Never Used  . Tobacco comment: 6 cig./day  Substance Use Topics  . Alcohol use: Yes    Comment: occasionally  . Drug use: Yes    Types: Marijuana, "Crack" cocaine, MDMA (Ecstacy)    Comment: Crack, THC, Molly    No Known Allergies  Current Outpatient Medications  Medication Sig Dispense Refill  . acetaminophen (TYLENOL) 500 MG tablet Take 500 mg  by mouth every 6 (six) hours as needed.     No current facility-administered medications for this visit.     Review of Systems Review of Systems  Constitutional: Negative.   Gastrointestinal: Negative.   Genitourinary: Positive for menstrual problem and pelvic pain. Negative for vaginal bleeding and vaginal pain.  Musculoskeletal: Positive for back pain.  Skin: Negative.   Psychiatric/Behavioral: Positive for suicidal ideas.    Blood pressure 130/77, pulse 90, height 5\' 11"  (1.803 m), weight (!) 343 lb (155.6 kg), last menstrual period 02/21/2019.  Physical Exam Physical Exam Constitutional:      Appearance: Normal appearance. She is obese.  Cardiovascular:     Rate and Rhythm: Normal rate.  Pulmonary:     Effort: Pulmonary effort is normal.  Skin:    General: Skin is warm.  Neurological:     General: No focal deficit present.     Mental Status: She is alert.  Psychiatric:        Mood and Affect: Mood normal.     Data Reviewed CLINICAL DATA:  Suprapubic and right adnexal pain. History of C-section.  EXAM: TRANSABDOMINAL AND TRANSVAGINAL ULTRASOUND OF PELVIS  DOPPLER ULTRASOUND OF OVARIES  TECHNIQUE: Both transabdominal and transvaginal ultrasound examinations of the pelvis were performed. Transabdominal technique was performed for global imaging of the  pelvis including uterus, ovaries, adnexal regions, and pelvic cul-de-sac.  It was necessary to proceed with endovaginal exam following the transabdominal exam to visualize the endometrium and ovaries. Color and duplex Doppler ultrasound was utilized to evaluate blood flow to the ovaries.  COMPARISON:  None.  FINDINGS: Uterus  Measurements: 11.2 x 5.7 x 6.9 cm = volume: 231 mL. There is a 2.6 x 1.9 x 1.5 cm fibroid in the posterior fundus. The patient's C-section scar is seen in the lower uterine segment.  Endometrium  Thickness: 9.1 mm.  No focal abnormality visualized.  Right  ovary  Measurements: 5.9 x 3.0 x 2.5 cm = volume: 23.3 mL. There is a septated, thick walled cystic mass with some mural nodularity. This mass measures 3.2 x 3.0 x 2.7 cm.  Left ovary  Not visualized.  Pulsed Doppler evaluation of the right ovary demonstrates normal low-resistance arterial and venous waveforms.  Other findings  Trace fluid is seen in the cul-de-sac.  IMPRESSION: Cysts  1. There is a complex cystic mass in the right ovary with a thickened wall, multiple septations, and mural nodularity. There is blood flow within the posterior thickened wall but not definitively within the nodularity. Recommend consultation with a gynecologist and a pelvic MRI for further evaluation. A neoplasm/malignancy is not excluded. 2. No torsion identified in the right ovary today's study. 3. The left ovary was not visualized.   Electronically Signed   By: Dorise Bullion III M.D   On: 02/18/2019 12:19  Assessment Right ovarian pelvic mass Pelvic pain Menometrorrhagia and fibroid IUD has fallen out  IPlan F/u US in 1 mo [facility] and CA 125 RTC for f/u    Emeterio Reeve 03/05/2019, 4:27 PM

## 2019-03-05 NOTE — Patient Instructions (Signed)
Ovarian Cyst An ovarian cyst is a fluid-filled sac on an ovary. The ovaries are organs that make eggs in women. Most ovarian cysts go away on their own and are not cancerous (are benign). Some cysts need treatment. Follow these instructions at home:  Take over-the-counter and prescription medicines only as told by your doctor.  Do not drive or use heavy machinery while taking prescription pain medicine.  Get pelvic exams and Pap tests as often as told by your doctor.  Return to your normal activities as told by your doctor. Ask your doctor what activities are safe for you.  Do not use any products that contain nicotine or tobacco, such as cigarettes and e-cigarettes. If you need help quitting, ask your doctor.  Keep all follow-up visits as told by your doctor. This is important. Contact a doctor if:  Your periods are: ? Late. ? Irregular. ? Painful.   Your periods stop.  You have pelvic pain that does not go away.  You have pressure on your bladder.  You have trouble making your bladder empty when you pee (urinate).  You have pain during sex.  You have any of the following in your belly (abdomen): ? A feeling of fullness. ? Pressure. ? Discomfort. ? Pain that does not go away. ? Swelling.  You feel sick most of the time.  You have trouble pooping (have constipation).  You are not as hungry as usual (you lose your appetite).  You get very bad acne.  You start to have more hair on your body and face.  You are gaining weight or losing weight without changing your exercise and eating habits.  You think you may be pregnant. Get help right away if:  You have belly pain that is very bad or gets worse.  You cannot eat or drink without throwing up (vomiting).  You suddenly get a fever.  Your period is a lot heavier than usual. This information is not intended to replace advice given to you by your health care provider. Make sure you discuss any questions you have  with your health care provider. Document Released: 11/24/2007 Document Revised: 05/20/2017 Document Reviewed: 11/09/2015 Elsevier Patient Education  2020 Elsevier Inc.  

## 2019-03-05 NOTE — Progress Notes (Signed)
Patient has elevated phq9 & gad7 today, will have her see Roselyn Reef after her visit. Dr Roselie Awkward aware

## 2019-03-05 NOTE — BH Specialist Note (Signed)
Integrated Behavioral Health Initial Visit  MRN: [id number] Name: Julie [name]  Number of [place] Clinician visits:: 1/6 Session Start time: 4:30 Session End time: 4:36 Total time: 15 minutes  Type of Service: [place] Interpretor:No. Interpretor Name and Language: n/a   Warm Hand Off Completed.       SUBJECTIVE: Julie [name] is a 43 y.o. female accompanied by n/a Patient was referred by Emeterio Reeve, MD for positive depression screen . Patient reports the following symptoms/concerns: Pt states her primary concern today is self-reported history of depression, PTSD, multiple personalities and bipolar 2, and has not been on medication "in a long time". Pt denies current SI, would like a referral to psychiatry, and requests to have a full visit with [facility] when she returns to clinic in one week same-day, as she has to pick up daughter from daycare and cannot stay today.  Duration of problem: Ongoing; Severity of problem: moderately severe  OBJECTIVE: Mood: Normal and Affect: Appropriate Risk of harm to self or others: Suicidal ideation No plan to harm self or others Denies current SI, no intent, no plan  LIFE CONTEXT: Family and Social: - School/Work: - Self-Care: - Life Changes: -  GOALS ADDRESSED: Patient will: 1. Reduce symptoms of: anxiety, depression and stress 2. Demonstrate ability to: Increase motivation to adhere to plan of care  INTERVENTIONS: Interventions utilized: Supportive Counseling  Standardized Assessments completed: GAD-7 and PHQ 9  ASSESSMENT: Patient currently experiencing History of depression, history of PTSD, History of bipolar disorder   Patient may benefit from psychoeducation and brief therapeutic interventions regarding coping with symptoms of depression and anxiety and referral to psychiatry. Marland Kitchen  PLAN: 1. Follow up with behavioral health clinician on : One week, or earlier as  needed 2. Behavioral recommendations:  -Call Mobile Crisis, as discussed, if SI returns -Accept referral to psychiatry 3. Referral(s): [place] (In Clinic) and Psychiatrist 4. "From scale of 1-10, how likely are you to follow plan?": -  Garlan Fair, LCSW  Depression screen [facility] 2/9 03/05/2019  Decreased Interest 1  Down, Depressed, Hopeless 2  PHQ - 2 Score 3  Altered sleeping 3  Tired, decreased energy 3  Change in appetite 3  Feeling bad or failure about yourself  3  Trouble concentrating 1  Moving slowly or fidgety/restless 2  Suicidal thoughts 1  PHQ-9 Score 19   GAD 7 : Generalized Anxiety Score 03/05/2019  Nervous, Anxious, on Edge 3  Control/stop worrying 3  Worry too much - different things 3  Trouble relaxing 3  Restless 3  Easily annoyed or irritable 3  Afraid - awful might happen 3  Total GAD 7 Score 21

## 2019-03-06 ENCOUNTER — Ambulatory Visit: Payer: Medicaid Other

## 2019-03-06 ENCOUNTER — Telehealth: Payer: Self-pay | Admitting: Family Medicine

## 2019-03-06 NOTE — Telephone Encounter (Signed)
Received a call from the patient canceling her appointment because she has another appointment.

## 2019-03-07 ENCOUNTER — Encounter: Payer: Self-pay | Admitting: Clinical

## 2019-03-07 ENCOUNTER — Ambulatory Visit: Payer: Medicaid Other

## 2019-03-07 LAB — CA 125: Cancer Antigen (CA) 125: 72.3 U/mL — ABNORMAL HIGH (ref 0.0–38.1)

## 2019-03-07 LAB — FOLLICLE STIMULATING HORMONE: FSH: 5.5 m[IU]/mL

## 2019-03-08 ENCOUNTER — Telehealth (INDEPENDENT_AMBULATORY_CARE_PROVIDER_SITE_OTHER): Payer: Medicaid Other | Admitting: Lactation Services

## 2019-03-08 DIAGNOSIS — N838 Other noninflammatory disorders of ovary, fallopian tube and broad ligament: Secondary | ICD-10-CM

## 2019-03-08 NOTE — Telephone Encounter (Signed)
Called pts phone and spoke with a person who identified himself as her boy friend. He reported pt was not available and did not have another way to contact her. Asked him to have pt call the office. He reports he will do so.

## 2019-03-08 NOTE — Telephone Encounter (Signed)
-----   Message from Woodroe Mode, MD sent at 03/08/2019  8:59 AM EDT ----- CA 125 was modestly elevated, she has an [place] scheduled and should f/u in the office, will recommend consult with Dr Denman George

## 2019-03-09 ENCOUNTER — Telehealth: Payer: Self-pay | Admitting: Family Medicine

## 2019-03-09 NOTE — Telephone Encounter (Signed)
Patient returning a call. °

## 2019-03-12 ENCOUNTER — Telehealth: Payer: Self-pay | Admitting: Family Medicine

## 2019-03-12 NOTE — Telephone Encounter (Signed)
Called pt to inform her of results. She is concerned she has Cancer, discussed the doctors are still collecting information to determine a diagnosis. Discussed that she will have her [place] and then follow up with Dr. Roselie Awkward who is planning to sent her to Dr. Denman George for evaluation. She was informed that Dr. Denman George is a Gyn oncologist. Pt voiced understanding.

## 2019-03-12 NOTE — Telephone Encounter (Signed)
Patient has called several times trying to get her results. She stated she has not received a call, and just want her results.

## 2019-03-13 ENCOUNTER — Ambulatory Visit (HOSPITAL_COMMUNITY): Admission: RE | Admit: 2019-03-13 | Payer: Medicaid Other | Source: Ambulatory Visit

## 2019-03-21 ENCOUNTER — Other Ambulatory Visit: Payer: Self-pay

## 2019-03-21 ENCOUNTER — Ambulatory Visit (INDEPENDENT_AMBULATORY_CARE_PROVIDER_SITE_OTHER): Payer: Medicaid Other | Admitting: Obstetrics and Gynecology

## 2019-03-21 ENCOUNTER — Ambulatory Visit (HOSPITAL_COMMUNITY)
Admission: RE | Admit: 2019-03-21 | Discharge: 2019-03-21 | Disposition: A | Discharge status: Home / Self Care | Payer: Medicaid Other | Source: Ambulatory Visit | Attending: Obstetrics & Gynecology | Admitting: Obstetrics & Gynecology

## 2019-03-21 ENCOUNTER — Encounter: Payer: Self-pay | Admitting: Obstetrics and Gynecology

## 2019-03-21 ENCOUNTER — Other Ambulatory Visit (HOSPITAL_COMMUNITY)
Admission: RE | Admit: 2019-03-21 | Discharge: 2019-03-21 | Disposition: A | Discharge status: Home / Self Care | Payer: Medicaid Other | Source: Ambulatory Visit | Attending: Obstetrics and Gynecology | Admitting: Obstetrics and Gynecology

## 2019-03-21 DIAGNOSIS — Z6841 Body Mass Index (BMI) 40.0 and over, adult: Secondary | ICD-10-CM | POA: Diagnosis not present

## 2019-03-21 DIAGNOSIS — N898 Other specified noninflammatory disorders of vagina: Secondary | ICD-10-CM | POA: Diagnosis not present

## 2019-03-21 DIAGNOSIS — R350 Frequency of micturition: Secondary | ICD-10-CM | POA: Diagnosis not present

## 2019-03-21 DIAGNOSIS — N838 Other noninflammatory disorders of ovary, fallopian tube and broad ligament: Secondary | ICD-10-CM | POA: Insufficient documentation

## 2019-03-21 DIAGNOSIS — E669 Obesity, unspecified: Secondary | ICD-10-CM | POA: Insufficient documentation

## 2019-03-21 LAB — POCT URINALYSIS DIP (DEVICE)
Bilirubin Urine: NEGATIVE
Glucose, UA: 500 mg/dL — AB
Ketones, ur: NEGATIVE mg/dL
Leukocytes,Ua: NEGATIVE
Nitrite: NEGATIVE
Protein, ur: 30 mg/dL — AB
Specific Gravity, Urine: 1.015 (ref 1.005–1.030)
Urobilinogen, UA: 0.2 mg/dL (ref 0.0–1.0)
pH: 5.5 (ref 5.0–8.0)

## 2019-03-21 MED ORDER — FLUCONAZOLE 100 MG PO TABS: ORAL_TABLET | ORAL | 0 refills | Status: DC

## 2019-03-21 MED ORDER — VALACYCLOVIR HCL 1 G PO TABS: 1000.0000 mg | ORAL_TABLET | Freq: Two times a day (BID) | ORAL | 3 refills | Status: DC

## 2019-03-21 NOTE — Progress Notes (Signed)
Ms Yothers presents for f/u of GYN U/S completed today. F/U right ovarian cyst U/S today, cyst has resolved  Pt also reports increased urinary freq and vaginal lesions with pain for the last week Denies recent IC Denies H/O HSV  Last pap unknown  PE AF VSS Lungs clear Heart RRR Abd soft + BS obese GU white discharge/film over labial with ulcer like lesions, discharge extends into the vagina, cervix no visible lesions, pap smear obtained, bimanual deferred d/t to pt discomfort  A/P Labial lesions        Urinary freq         Ovarian cyst        Obesity        Bipolar disorder/PTSD  HSV culture and pap today. Will begin treatment for yeast and HSV. Advised to have partner evaluated as well. Suspect pt is diabetic as well. Well check A1C and list of PCP's provided to pt. U/S results reviewed with pt. Pt has seen Roselyn Reef and was to see Psych but has not seen yet. Pt was encouraged to schedule appt. Pt has f/u appt already scheduled next month

## 2019-03-22 LAB — HEMOGLOBIN A1C
Est. average glucose Bld gHb Est-mCnc: 301 mg/dL
Hgb A1c MFr Bld: 12.1 % — ABNORMAL HIGH (ref 4.8–5.6)

## 2019-03-22 LAB — URINE CULTURE

## 2019-03-23 ENCOUNTER — Telehealth: Payer: Self-pay | Admitting: *Deleted

## 2019-03-23 LAB — HERPES SIMPLEX VIRUS CULTURE

## 2019-03-23 NOTE — Telephone Encounter (Signed)
-----   Message from Chancy Milroy, MD sent at 03/22/2019  5:42 AM EDT ----- Let pt know that her A1c is elevated  Suggestive of DM Important she sees her PCP for treatment  Thanks Legrand Como

## 2019-03-23 NOTE — Telephone Encounter (Signed)
Roniqua called back and I informed her that her results and plan per Dr.Ervin. She states she does not have a PCP but upon chart review we discussed she has Medicaid [employer] and is assigned [facility]. I explained that is who she is assigned for Primary care by Medicaid. She states she owes them money for a no show and they won't see her. I advised her to call her Medicaid case worker to see if they can change her PCP. I also informed her we usually refer for PCP to [facility] or [facility] .  I also adised if in the meantime she feels like her blood sugar is very high to go to Urgent care of ED. She voices understanding. Sheri Prows,RN

## 2019-03-23 NOTE — Telephone Encounter (Signed)
I called Judee and left a message I am calling with non urgent information from your provider. Please call our office. We are closing at 12 noon today until Monday am. Ashaz Robling,RN

## 2019-03-26 ENCOUNTER — Encounter: Payer: Self-pay | Admitting: Obstetrics and Gynecology

## 2019-03-26 DIAGNOSIS — A599 Trichomoniasis, unspecified: Secondary | ICD-10-CM | POA: Insufficient documentation

## 2019-03-26 LAB — CYTOLOGY - PAP
Adequacy: ABSENT
Chlamydia: NEGATIVE
Diagnosis: NEGATIVE
High risk HPV: NEGATIVE
Neisseria Gonorrhea: NEGATIVE
Trichomonas: POSITIVE — AB

## 2019-03-27 ENCOUNTER — Telehealth: Payer: Self-pay

## 2019-03-27 ENCOUNTER — Other Ambulatory Visit: Payer: Self-pay

## 2019-03-27 DIAGNOSIS — A599 Trichomoniasis, unspecified: Secondary | ICD-10-CM

## 2019-03-27 MED ORDER — METRONIDAZOLE 500 MG PO TABS: 500.0000 mg | ORAL_TABLET | Freq: Two times a day (BID) | ORAL | 0 refills | Status: DC

## 2019-03-27 NOTE — Telephone Encounter (Signed)
Called pt to advise that she tested positive for Trich & Flagyl was sent to Pharmacy on file, advised to inform partner to get treated and no sex until 2 weeks after last person is treated. Pt verbalized understanding.

## 2019-04-12 ENCOUNTER — Ambulatory Visit: Payer: Medicaid Other | Admitting: Obstetrics & Gynecology

## 2019-04-20 ENCOUNTER — Other Ambulatory Visit: Payer: Self-pay

## 2019-04-20 ENCOUNTER — Encounter (HOSPITAL_COMMUNITY): Payer: Self-pay | Admitting: Emergency Medicine

## 2019-04-20 ENCOUNTER — Emergency Department (HOSPITAL_COMMUNITY)
Admission: EM | Admit: 2019-04-20 | Discharge: 2019-04-21 | Disposition: A | Discharge status: Home / Self Care | Payer: Medicaid Other | Attending: Emergency Medicine | Admitting: Emergency Medicine

## 2019-04-20 DIAGNOSIS — Z79899 Other long term (current) drug therapy: Secondary | ICD-10-CM | POA: Insufficient documentation

## 2019-04-20 DIAGNOSIS — F17210 Nicotine dependence, cigarettes, uncomplicated: Secondary | ICD-10-CM | POA: Diagnosis not present

## 2019-04-20 DIAGNOSIS — L732 Hidradenitis suppurativa: Secondary | ICD-10-CM | POA: Insufficient documentation

## 2019-04-20 DIAGNOSIS — J45909 Unspecified asthma, uncomplicated: Secondary | ICD-10-CM | POA: Diagnosis not present

## 2019-04-20 DIAGNOSIS — R739 Hyperglycemia, unspecified: Secondary | ICD-10-CM | POA: Diagnosis not present

## 2019-04-20 DIAGNOSIS — L02211 Cutaneous abscess of abdominal wall: Secondary | ICD-10-CM | POA: Diagnosis present

## 2019-04-20 NOTE — ED Triage Notes (Signed)
Patient reports multiple boils at vagina and buttocks onset this week, denies fever or chills , CBG= 418 by EMS , treated with Valtrex for genital herpes with no relief.

## 2019-04-21 LAB — BASIC METABOLIC PANEL
Anion gap: 9 (ref 5–15)
BUN: 5 mg/dL — ABNORMAL LOW (ref 6–20)
CO2: 20 mmol/L — ABNORMAL LOW (ref 22–32)
Calcium: 9.2 mg/dL (ref 8.9–10.3)
Chloride: 107 mmol/L (ref 98–111)
Creatinine, Ser: 0.63 mg/dL (ref 0.44–1.00)
GFR calc Af Amer: >60 mL/min (ref >60)
GFR calc non Af Amer: >60 mL/min (ref >60)
Glucose, Bld: 292 mg/dL — ABNORMAL HIGH (ref 70–99)
Potassium: 3.6 mmol/L (ref 3.5–5.1)
Sodium: 136 mmol/L (ref 135–145)

## 2019-04-21 LAB — CBC WITH DIFFERENTIAL/PLATELET
Abs Immature Granulocytes: 0.03 10*3/uL (ref 0.00–0.07)
Basophils Absolute: 0 10*3/uL (ref 0.0–0.1)
Basophils Relative: 0 %
Eosinophils Absolute: 0.1 10*3/uL (ref 0.0–0.5)
Eosinophils Relative: 1 %
HCT: 36.4 % (ref 36.0–46.0)
Hemoglobin: 10.8 g/dL — ABNORMAL LOW (ref 12.0–15.0)
Immature Granulocytes: 0 %
Lymphocytes Relative: 24 %
Lymphs Abs: 2.1 10*3/uL (ref 0.7–4.0)
MCH: 22.5 pg — ABNORMAL LOW (ref 26.0–34.0)
MCHC: 29.7 g/dL — ABNORMAL LOW (ref 30.0–36.0)
MCV: 76 fL — ABNORMAL LOW (ref 80.0–100.0)
Monocytes Absolute: 0.8 10*3/uL (ref 0.1–1.0)
Monocytes Relative: 9 %
Neutro Abs: 5.6 10*3/uL (ref 1.7–7.7)
Neutrophils Relative %: 66 %
Platelets: 273 10*3/uL (ref 150–400)
RBC: 4.79 MIL/uL (ref 3.87–5.11)
RDW: 17.8 % — ABNORMAL HIGH (ref 11.5–15.5)
WBC: 8.6 10*3/uL (ref 4.0–10.5)
nRBC: 0 % (ref 0.0–0.2)

## 2019-04-21 MED ORDER — CEPHALEXIN 500 MG PO CAPS: 500.0000 mg | ORAL_CAPSULE | Freq: Four times a day (QID) | ORAL | 0 refills | Status: DC

## 2019-04-21 MED ORDER — SULFAMETHOXAZOLE-TRIMETHOPRIM 800-160 MG PO TABS: 1.0000 | ORAL_TABLET | Freq: Two times a day (BID) | ORAL | 0 refills | Status: AC

## 2019-04-21 MED ORDER — LIDOCAINE-EPINEPHRINE 2 %-1:100000 IJ SOLN
20.0000 mL | Freq: Once | INTRAMUSCULAR | Status: DC
  Filled 2019-04-21 (×2): qty 20

## 2019-04-21 MED ORDER — METFORMIN HCL 500 MG PO TABS: 500.0000 mg | ORAL_TABLET | Freq: Two times a day (BID) | ORAL | 0 refills | Status: DC

## 2019-04-21 MED ORDER — HYDROCODONE-ACETAMINOPHEN 5-325 MG PO TABS: 1.0000 | ORAL_TABLET | Freq: Four times a day (QID) | ORAL | 0 refills | Status: DC | PRN

## 2019-04-21 MED ORDER — LIDOCAINE-EPINEPHRINE (PF) 2 %-1:200000 IJ SOLN
20.0000 mL | Freq: Once | INTRAMUSCULAR | Status: DC
  Filled 2019-04-21: qty 20

## 2019-04-21 MED ORDER — MORPHINE SULFATE (PF) 4 MG/ML IV SOLN: 4.0000 mg | Freq: Once | INTRAVENOUS | Status: DC

## 2019-04-21 MED ORDER — SULFAMETHOXAZOLE-TRIMETHOPRIM 800-160 MG PO TABS
1.0000 | ORAL_TABLET | Freq: Once | ORAL | Status: AC
  Administered 2019-04-21: 1 via ORAL
  Filled 2019-04-21: qty 1

## 2019-04-21 MED ORDER — INSULIN ASPART 100 UNIT/ML ~~LOC~~ SOLN
5.0000 [IU] | Freq: Once | SUBCUTANEOUS | Status: AC
  Administered 2019-04-21: 5 [IU] via SUBCUTANEOUS

## 2019-04-21 MED ORDER — OXYCODONE-ACETAMINOPHEN 5-325 MG PO TABS
1.0000 | ORAL_TABLET | Freq: Once | ORAL | Status: AC
  Administered 2019-04-21: 1 via ORAL
  Filled 2019-04-21: qty 1

## 2019-04-21 NOTE — ED Notes (Signed)
Pt verbalized understanding of discharge paperwork, prescriptions and follow-up care 

## 2019-04-21 NOTE — Discharge Instructions (Signed)
You have several abscesses that was drained today.  Please follow up with your doctor in 2 days for packing removal or return to the ER for recheck.  Take antibiotics as prescribed.  Your blood sugar is elevated today, I am concern you have diabetes.  Take metformin and discuss with your doctor for better management of your diabetes.

## 2019-04-21 NOTE — ED Provider Notes (Signed)
[place] EMERGENCY DEPARTMENT Provider Note   CSN: [id number] Arrival date & time: 04/20/19  2339     History   Chief Complaint Chief Complaint  Patient presents with  . Boils at Vagina    HPI Julie [name] is a 43 y.o. female.     The history is provided by the patient and medical records. No language interpreter was used.     43 year old female with hx of polysubstance abuse, obesity, diabetes presenting complaining of skin sores.  Patient report for the past several weeks she has noticed several boils that appears around her vaginal region and her buttocks that is both painful and itchy.  On occasion will use out some fluid.  She report pain is moderate in severity.  She denies any specific treatment at home aside from Vaseline.  She denies any provocative factors such as change in soap detergent new body wash.  She denies any new sexual partner.  She initially was seen by her doctor several weeks prior for this complaint and was given treatment for potential genital herpes.  She has been taking Valtrex with no relief.  She was also told that her blood sugar was elevated.  She took some insulin that belongs to her brother a week ago.  Otherwise she denies having fever, dysuria, bowel bladder changes.  Past Medical History:  Diagnosis Date  . Asthma   . Fracture, humerus closed, shaft 07/11/2014   left  . History of bronchitis   . Mental health problem   . Open fracture of great toe of left foot 12/25/2015    Patient Active Problem List   Diagnosis Date Noted  . Trichimoniasis 03/26/2019  . Urinary frequency 03/21/2019  . Obesity 03/21/2019  . Vaginal lesion 03/21/2019  . Cocaine use disorder, severe, dependence ([place]) 12/12/2016  . Cocaine abuse with cocaine-induced psychotic disorder, with delusions ([place]) 12/12/2016  . S/P ORIF (open reduction internal fixation) fracture 12/25/2015    Past Surgical History:  Procedure Laterality Date  .  CESAREAN SECTION    . ECTOPIC PREGNANCY SURGERY  2003  . INCISION AND DRAINAGE Left 12/25/2015   Procedure: INCISION AND DRAINAGE With  Amputation of distal tip of Left Great and Second Toe.;  Surgeon: Leandrew Koyanagi, MD;  Location: [facility];  Service: Orthopedics;  Laterality: Left;  . LIPOMA EXCISION     chest  . ORIF HUMERUS FRACTURE Left 07/15/2014   Procedure: OPEN REDUCTION INTERNAL FIXATION (ORIF) LEFT HUMERAL SHAFT ;  Surgeon: Renette Butters, MD;  Location: [facility];  Service: Orthopedics;  Laterality: Left;  . TUBAL LIGATION  2003     OB History   No obstetric history on file.      Home Medications    Prior to Admission medications   Medication Sig Start Date End Date Taking? Authorizing Provider  acetaminophen (TYLENOL) 500 MG tablet Take 500 mg by mouth every 6 (six) hours as needed.    [provider]  fluconazole (DIFLUCAN) 100 MG tablet Take a tablet qd x 5 days 03/21/19   Chancy Milroy, MD  metroNIDAZOLE (FLAGYL) 500 MG tablet Take 1 tablet (500 mg total) by mouth 2 (two) times daily. 03/27/19   Chancy Milroy, MD  valACYclovir (VALTREX) 1000 MG tablet Take 1 tablet (1,000 mg total) by mouth 2 (two) times daily. Take for ten days. 03/21/19   Chancy Milroy, MD    Family History No family history on file.  Social History Social History   Tobacco Use  . Smoking status: Current Every Day Smoker    Years: 18.00    Types: Cigarettes  . Smokeless tobacco: Never Used  . Tobacco comment: 6 cig./day  Substance Use Topics  . Alcohol use: Yes    Comment: occasionally  . Drug use: Yes    Types: Marijuana, "Crack" cocaine, MDMA (Ecstacy)    Comment: Crack, THC, Molly     Allergies   Patient has no known allergies.   Review of Systems Review of Systems  All other systems reviewed and are negative.    Physical Exam Updated Vital Signs BP (!) 132/96 (BP Location: Right Wrist)   Pulse 90   Temp 98.7 F (37.1 C) (Oral)   Resp 20    SpO2 99%   Physical Exam Vitals signs and nursing note reviewed.  Constitutional:      General: She is not in acute distress.    Appearance: She is well-developed. She is obese.  HENT:     Head: Atraumatic.  Eyes:     Conjunctiva/sclera: Conjunctivae normal.  Neck:     Musculoskeletal: Neck supple.  Cardiovascular:     Rate and Rhythm: Normal rate and regular rhythm.  Pulmonary:     Effort: Pulmonary effort is normal.     Breath sounds: Normal breath sounds.  Abdominal:     General: Abdomen is flat.     Palpations: Abdomen is soft.     Tenderness: There is no abdominal tenderness.  Genitourinary:    Comments: Chaperone present during exam.  There is an area of induration and fluctuant oozing out purulent material on the right lower pannus adjacent to the right inguinal region.  Area is tender to palpation.  Inferior to that is an area of induration fluctuant approximately 3 cm in diameter tender to palpation at the right inguinal region.  Multiple smaller lesions noted to perineal area mildly tender but no obvious erythema or fluctuance noted.  Finding consistent with hidradenitis suppurativa. Skin:    Findings: No rash.  Neurological:     Mental Status: She is alert and oriented to person, place, and time.  Psychiatric:        Mood and Affect: Mood normal.      ED Treatments / Results  Labs (all labs ordered are listed, but only abnormal results are displayed) Labs Reviewed  CBC WITH DIFFERENTIAL/PLATELET - Abnormal; Notable for the following components:      Result Value   Hemoglobin 10.8 (*)    MCV 76.0 (*)    MCH 22.5 (*)    MCHC 29.7 (*)    RDW 17.8 (*)    All other components within normal limits  BASIC METABOLIC PANEL - Abnormal; Notable for the following components:   CO2 20 (*)    Glucose, Bld 292 (*)    BUN 5 (*)    All other components within normal limits  URINALYSIS, ROUTINE W REFLEX MICROSCOPIC    EKG None  Radiology No results found.   Procedures .Marland KitchenIncision and Drainage  Date/Time: 04/21/2019 9:32 AM Performed by: Domenic Moras, PA-C Authorized by: Domenic Moras, PA-C   Consent:    Consent obtained:  Verbal   Consent given by:  Patient   Risks discussed:  Bleeding, incomplete drainage, pain and damage to other organs   Alternatives discussed:  No treatment Universal protocol:    Procedure explained and questions answered to patient or proxy's satisfaction: yes     Relevant documents present  and verified: yes     Test results available and properly labeled: yes     Imaging studies available: yes     Required blood products, implants, devices, and special equipment available: yes     Site/side marked: yes     Immediately prior to procedure a time out was called: yes     Patient identity confirmed:  Verbally with patient Location:    Type:  Abscess   Size:  4cm   Location:  Trunk   Trunk location:  Abdomen (right lower pannus) Pre-procedure details:    Skin preparation:  Betadine Anesthesia (see MAR for exact dosages):    Anesthesia method:  Local infiltration   Local anesthetic:  Lidocaine 1% WITH epi Procedure type:    Complexity:  Complex Procedure details:    Incision types:  Single straight   Incision depth:  Subcutaneous   Scalpel blade:  11   Wound management:  Probed and deloculated, irrigated with saline and extensive cleaning   Drainage:  Purulent   Drainage amount:  Moderate   Packing materials:  1/4 in gauze Post-procedure details:    Patient tolerance of procedure:  Tolerated with difficulty .Marland KitchenIncision and Drainage  Date/Time: 04/21/2019 9:33 AM Performed by: Domenic Moras, PA-C Authorized by: Domenic Moras, PA-C   Consent:    Consent obtained:  Verbal   Consent given by:  Patient   Risks discussed:  Bleeding, incomplete drainage, pain and damage to other organs   Alternatives discussed:  No treatment Universal protocol:    Procedure explained and questions answered to patient or proxy's  satisfaction: yes     Relevant documents present and verified: yes     Test results available and properly labeled: yes     Imaging studies available: yes     Required blood products, implants, devices, and special equipment available: yes     Site/side marked: yes     Immediately prior to procedure a time out was called: yes     Patient identity confirmed:  Verbally with patient Location:    Type:  Abscess   Size:  3   Location:  Anogenital   Anogenital location: left pupis. Pre-procedure details:    Skin preparation:  Betadine Anesthesia (see MAR for exact dosages):    Anesthesia method:  Local infiltration   Local anesthetic:  Lidocaine 1% WITH epi Procedure type:    Complexity:  Complex Procedure details:    Incision types:  Single straight   Incision depth:  Subcutaneous   Scalpel blade:  11   Wound management:  Probed and deloculated, irrigated with saline and extensive cleaning   Drainage:  Purulent   Drainage amount:  Moderate   Packing materials:  1/4 in gauze Post-procedure details:    Patient tolerance of procedure:  Tolerated well, no immediate complications   (including critical care time)  Medications Ordered in ED Medications  lidocaine-EPINEPHrine (XYLOCAINE W/EPI) 2 %-1:200000 (PF) injection 20 mL (has no administration in time range)  sulfamethoxazole-trimethoprim (BACTRIM DS) 800-160 MG per tablet 1 tablet (1 tablet Oral Given 04/21/19 1014)  insulin aspart (novoLOG) injection 5 Units (5 Units Subcutaneous Given 04/21/19 1013)  oxyCODONE-acetaminophen (PERCOCET/ROXICET) 5-325 MG per tablet 1 tablet (1 tablet Oral Given 04/21/19 1037)     Initial Impression / Assessment and Plan / ED Course  I have reviewed the triage vital signs and the nursing notes.  Pertinent labs & imaging results that were available during my care of the patient were reviewed by me and  considered in my medical decision making (see chart for details).        BP 135/62 (BP  Location: Right Arm)   Pulse 86   Temp 98.7 F (37.1 C) (Oral)   Resp 14   SpO2 98%    Final Clinical Impressions(s) / ED Diagnoses   Final diagnoses:  Hidradenitis suppurativa  Hyperglycemia    ED Discharge Orders         Ordered    cephALEXin (KEFLEX) 500 MG capsule  4 times daily     04/21/19 1039    sulfamethoxazole-trimethoprim (BACTRIM DS) 800-160 MG tablet  2 times daily     04/21/19 1039    HYDROcodone-acetaminophen (NORCO/VICODIN) 5-325 MG tablet  Every 6 hours PRN     04/21/19 1039    metFORMIN (GLUCOPHAGE) 500 MG tablet  2 times daily with meals     04/21/19 1040         Patient complaining of several boil like lesions to her perineum for the past month, initially treated for potential herpes infection with Valtrex without relief.  On exam patient has multiple cutaneous abscesses of various sizes to her inguinal region as well as right lower pannus consistence with hidradenitis suppurativa.  One of them is actively draining.  Anterior.  There is another abscess to her right inguinal region that would benefit from incision and drainage.  Patient also had elevated CBG greater than 400.  Documented hemoglobin A1c of 14.  She is currently not on any diabetic medication.  9:34 AM Several moderate size abscesses were drained by me.  Packing placed. Pt felt better. Labs showing repeat CBG of 292, no anion gap.   10:41 AM Pt given metformin for her hyperglycemia.  Encourage pt to f/u with PCP for further mangaement of her diabetes.  Recommend having packing remove in 2 days.  Abscess care instruction provided.  Return precaution discussed.    Domenic Moras, PA-C 04/21/19 1042    Noemi Chapel, MD 04/24/19 [phone]

## 2019-05-23 ENCOUNTER — Ambulatory Visit: Payer: Self-pay | Admitting: Family Medicine

## 2020-05-03 ENCOUNTER — Other Ambulatory Visit: Payer: Self-pay

## 2020-05-03 ENCOUNTER — Emergency Department (HOSPITAL_COMMUNITY)
Admission: EM | Admit: 2020-05-03 | Discharge: 2020-05-04 | Disposition: A | Discharge status: Home / Self Care | Payer: Medicaid Other | Attending: Emergency Medicine | Admitting: Emergency Medicine

## 2020-05-03 ENCOUNTER — Encounter (HOSPITAL_COMMUNITY): Payer: Self-pay | Admitting: Emergency Medicine

## 2020-05-03 DIAGNOSIS — J45909 Unspecified asthma, uncomplicated: Secondary | ICD-10-CM | POA: Insufficient documentation

## 2020-05-03 DIAGNOSIS — F17210 Nicotine dependence, cigarettes, uncomplicated: Secondary | ICD-10-CM | POA: Diagnosis not present

## 2020-05-03 DIAGNOSIS — Z5189 Encounter for other specified aftercare: Secondary | ICD-10-CM

## 2020-05-03 DIAGNOSIS — N898 Other specified noninflammatory disorders of vagina: Secondary | ICD-10-CM | POA: Insufficient documentation

## 2020-05-03 DIAGNOSIS — Z202 Contact with and (suspected) exposure to infections with a predominantly sexual mode of transmission: Secondary | ICD-10-CM

## 2020-05-03 NOTE — ED Triage Notes (Signed)
Patient reports skin abscess at left groin with malodorous drainage onset this week , denies dysuria or fever .

## 2020-05-04 LAB — RAPID HIV SCREEN (HIV 1/2 AB+AG)
HIV 1/2 Antibodies: NONREACTIVE
HIV-1 P24 Antigen - HIV24: NONREACTIVE

## 2020-05-04 MED ORDER — LIDOCAINE-EPINEPHRINE 1 %-1:100000 IJ SOLN
10.0000 mL | Freq: Once | INTRAMUSCULAR | Status: DC
  Filled 2020-05-04: qty 1

## 2020-05-04 MED ORDER — CEPHALEXIN 500 MG PO CAPS: 500.0000 mg | ORAL_CAPSULE | Freq: Two times a day (BID) | ORAL | 0 refills | Status: DC

## 2020-05-04 NOTE — ED Notes (Signed)
Pt refused pelvic and all other care. Wanted to be d/c with abx and sent home.

## 2020-05-04 NOTE — ED Provider Notes (Signed)
[facility] EMERGENCY DEPARTMENT Provider Note   CSN: [id number] Arrival date & time: 05/03/20  2237     History Chief Complaint  Patient presents with  . Groin Abscess    Julie [name] is a 44 y.o. female.  Patient presents to the emergency department requesting testing for "all STDs."  She states that she is having some vaginal discharge and also reports foul-smelling urine.  She states that she thinks she has a boil on the inner thigh.  She denies any fevers or chills.  She states that her symptoms come and go with antibiotic treatment.  She has had no change in sexual partners.  She denies any other associated symptoms.  She does not have a PCP.  The history is provided by the patient. No language interpreter was used.       Past Medical History:  Diagnosis Date  . Asthma   . Fracture, humerus closed, shaft 07/11/2014   left  . History of bronchitis   . Mental health problem   . Open fracture of great toe of left foot 12/25/2015    Patient Active Problem List   Diagnosis Date Noted  . Trichimoniasis 03/26/2019  . Urinary frequency 03/21/2019  . Obesity 03/21/2019  . Vaginal lesion 03/21/2019  . Cocaine use disorder, severe, dependence ([place]) 12/12/2016  . Cocaine abuse with cocaine-induced psychotic disorder, with delusions ([place]) 12/12/2016  . S/P ORIF (open reduction internal fixation) fracture 12/25/2015    Past Surgical History:  Procedure Laterality Date  . CESAREAN SECTION    . ECTOPIC PREGNANCY SURGERY  2003  . INCISION AND DRAINAGE Left 12/25/2015   Procedure: INCISION AND DRAINAGE With  Amputation of distal tip of Left Great and Second Toe.;  Surgeon: Leandrew Koyanagi, MD;  Location: [facility];  Service: Orthopedics;  Laterality: Left;  . LIPOMA EXCISION     chest  . ORIF HUMERUS FRACTURE Left 07/15/2014   Procedure: OPEN REDUCTION INTERNAL FIXATION (ORIF) LEFT HUMERAL SHAFT ;  Surgeon: Renette Butters, MD;  Location: [place];  Service: Orthopedics;  Laterality: Left;  . TUBAL LIGATION  2003     OB History   No obstetric history on file.     No family history on file.  Social History   Tobacco Use  . Smoking status: Current Every Day Smoker    Years: 18.00    Types: Cigarettes  . Smokeless tobacco: Never Used  . Tobacco comment: 6 cig./day  Substance Use Topics  . Alcohol use: Yes    Comment: occasionally  . Drug use: Yes    Types: Marijuana, "Crack" cocaine, MDMA (Ecstacy)    Comment: Crack, THC, Molly    Home Medications Prior to Admission medications   Medication Sig Start Date End Date Taking? Authorizing Provider  acetaminophen (TYLENOL) 500 MG tablet Take 500 mg by mouth every 6 (six) hours as needed.    [provider]  cephALEXin (KEFLEX) 500 MG capsule Take 1 capsule (500 mg total) by mouth 2 (two) times daily. 05/04/20   Montine Circle, PA-C  fluconazole (DIFLUCAN) 100 MG tablet Take a tablet qd x 5 days 03/21/19   Chancy Milroy, MD  HYDROcodone-acetaminophen (NORCO/VICODIN) 5-325 MG tablet Take 1 tablet by mouth every 6 (six) hours as needed for moderate pain. 04/21/19   Domenic Moras, PA-C  metFORMIN (GLUCOPHAGE) 500 MG tablet Take 1 tablet (500 mg total) by mouth 2 (two) times daily with a meal. 04/21/19   Rona Ravens,  Gertie Fey, PA-C  valACYclovir (VALTREX) 1000 MG tablet Take 1 tablet (1,000 mg total) by mouth 2 (two) times daily. Take for ten days. 03/21/19   Chancy Milroy, MD    Allergies    Patient has no known allergies.  Review of Systems   Review of Systems  All other systems reviewed and are negative.   Physical Exam Updated Vital Signs BP (!) 164/90 (BP Location: Right Arm)   Pulse 89   Temp 98 F (36.7 C) (Oral)   Resp 18   Ht 5\' 11"  (1.803 m)   Wt (!) 145 kg   LMP 04/17/2020   SpO2 97%   BMI 44.58 kg/m   Physical Exam Vitals and nursing note reviewed.  Constitutional:      General: She is not in acute distress.    Appearance: She is  well-developed.  HENT:     Head: Normocephalic and atraumatic.     Right Ear: Tympanic membrane normal.     Left Ear: Tympanic membrane normal.     Ears:     Comments: TMs are normal bilaterally    Mouth/Throat:     Comments: Oropharynx is normal Eyes:     Conjunctiva/sclera: Conjunctivae normal.  Cardiovascular:     Rate and Rhythm: Normal rate and regular rhythm.     Heart sounds: No murmur heard.   Pulmonary:     Effort: Pulmonary effort is normal. No respiratory distress.     Breath sounds: Normal breath sounds.  Abdominal:     Palpations: Abdomen is soft.     Tenderness: There is no abdominal tenderness.  Genitourinary:    Comments: Small sore to the left upper thigh, no abscess, no cellulitis No vaginal discharge seen on external genitalia exam Chaperone present for exam Patient declined remaining speculum exam Musculoskeletal:        General: Normal range of motion.     Cervical back: Neck supple.  Skin:    General: Skin is warm and dry.  Neurological:     Mental Status: She is alert and oriented to person, place, and time.  Psychiatric:        Mood and Affect: Mood normal.        Behavior: Behavior normal.     ED Results / Procedures / Treatments   Labs (all labs ordered are listed, but only abnormal results are displayed) Labs Reviewed  WET PREP, GENITAL  URINALYSIS, ROUTINE W REFLEX MICROSCOPIC  RAPID HIV SCREEN (HIV 1/2 AB+AG)  GC/CHLAMYDIA PROBE AMP ([place]) NOT AT [facility]    EKG None  Radiology No results found.  Procedures Procedures (including critical care time)  Medications Ordered in ED Medications - No data to display  ED Course  I have reviewed the triage vital signs and the nursing notes.  Pertinent labs & imaging results that were available during my care of the patient were reviewed by me and considered in my medical decision making (see chart for details).    MDM Rules/Calculators/A&P                          Patient  originally requested STD testing for "everything."  She is also concerned about an abscess on her left upper thigh.  There is no abscess seen.  Patient then stated that she just wanted some antibiotics, no longer wants STD testing because she has no means of following up on the results.  I told the patient about my chart.  I also told her that we would contact her.  She said that she had no means of accessing either of these.  She says that she would just like to be discharged.  I have given her instructions to follow-up at the health department.  Have also given her instructions about community health and wellness.  She declines further treatment or evaluation tonight.  She does not appear toxic.  She does not appear to require any further emergent work-up or evaluation.     Final Clinical Impression(s) / ED Diagnoses Final diagnoses:  Possible exposure to STD  Visit for wound check    Rx / DC Orders ED Discharge Orders         Ordered    cephALEXin (KEFLEX) 500 MG capsule  2 times daily        05/04/20 0159           Montine Circle, PA-C 49/32/41 9914    Delora Fuel, MD 44/58/48 [phone]

## 2020-09-28 ENCOUNTER — Emergency Department (HOSPITAL_COMMUNITY)
Admission: EM | Admit: 2020-09-28 | Discharge: 2020-09-28 | Disposition: A | Discharge status: Home / Self Care | Payer: Medicaid Other | Attending: Emergency Medicine | Admitting: Emergency Medicine

## 2020-09-28 ENCOUNTER — Emergency Department (HOSPITAL_COMMUNITY): Payer: Medicaid Other

## 2020-09-28 ENCOUNTER — Encounter (HOSPITAL_COMMUNITY): Payer: Self-pay

## 2020-09-28 ENCOUNTER — Other Ambulatory Visit: Payer: Self-pay

## 2020-09-28 DIAGNOSIS — S93491A Sprain of other ligament of right ankle, initial encounter: Secondary | ICD-10-CM | POA: Diagnosis not present

## 2020-09-28 DIAGNOSIS — S99911A Unspecified injury of right ankle, initial encounter: Secondary | ICD-10-CM | POA: Diagnosis present

## 2020-09-28 DIAGNOSIS — J45909 Unspecified asthma, uncomplicated: Secondary | ICD-10-CM | POA: Insufficient documentation

## 2020-09-28 DIAGNOSIS — F17210 Nicotine dependence, cigarettes, uncomplicated: Secondary | ICD-10-CM | POA: Diagnosis not present

## 2020-09-28 DIAGNOSIS — L240 Irritant contact dermatitis due to detergents: Secondary | ICD-10-CM | POA: Diagnosis not present

## 2020-09-28 DIAGNOSIS — W19XXXA Unspecified fall, initial encounter: Secondary | ICD-10-CM | POA: Diagnosis not present

## 2020-09-28 MED ORDER — NAPROXEN 250 MG PO TABS
500.0000 mg | ORAL_TABLET | Freq: Once | ORAL | Status: AC
  Administered 2020-09-28: 500 mg via ORAL
  Filled 2020-09-28: qty 2

## 2020-09-28 NOTE — ED Notes (Signed)
Pt reported to rn she is on her period. Pt provided wash cloth soap, and new pants.RN asked if pt needs help cleaning herself up. Pt denied needing help stated she wanted privacy, call bell within reach

## 2020-09-28 NOTE — ED Provider Notes (Signed)
[facility] EMERGENCY DEPARTMENT Provider Note  CSN: [id number] Arrival date & time: 09/28/20 0256  Chief Complaint(s) Ankle Pain  HPI Julie [name] is a 46 y.o. female who presents to the emergency department with 1 week of right ankle pain that began while trying to stand up off the floor.  Pain has been persistent since.  Worse with ambulation, range of motion.  Pain radiates up to the right knee.  Described as sharp shooting pain.  She denies any falls or trauma.  No fevers or chills.  Wounds or injuries.  Additionally she has skin irritation along the bilateral chest wall that she noted today.  Reports that she has not been wearing a bra for the past week and when she put on a bra that she had recently washed with a new soap she started feeling the itchiness.  HPI  Past Medical History Past Medical History:  Diagnosis Date  . Asthma   . Fracture, humerus closed, shaft 07/11/2014   left  . History of bronchitis   . Mental health problem   . Open fracture of great toe of left foot 12/25/2015   Patient Active Problem List   Diagnosis Date Noted  . Trichimoniasis 03/26/2019  . Urinary frequency 03/21/2019  . Obesity 03/21/2019  . Vaginal lesion 03/21/2019  . Cocaine use disorder, severe, dependence ([facility]) 12/12/2016  . Cocaine abuse with cocaine-induced psychotic disorder, with delusions ([facility]) 12/12/2016  . S/P ORIF (open reduction internal fixation) fracture 12/25/2015   Home Medication(s) Prior to Admission medications   Medication Sig Start Date End Date Taking? Authorizing Provider  acetaminophen (TYLENOL) 500 MG tablet Take 500 mg by mouth every 6 (six) hours as needed.    [provider]  cephALEXin (KEFLEX) 500 MG capsule Take 1 capsule (500 mg total) by mouth 2 (two) times daily. 05/04/20   Montine Circle, PA-C  fluconazole (DIFLUCAN) 100 MG tablet Take a tablet qd x 5 days 03/21/19   Chancy Milroy, MD  HYDROcodone-acetaminophen  (NORCO/VICODIN) 5-325 MG tablet Take 1 tablet by mouth every 6 (six) hours as needed for moderate pain. 04/21/19   Domenic Moras, PA-C  metFORMIN (GLUCOPHAGE) 500 MG tablet Take 1 tablet (500 mg total) by mouth 2 (two) times daily with a meal. 04/21/19   Domenic Moras, PA-C  valACYclovir (VALTREX) 1000 MG tablet Take 1 tablet (1,000 mg total) by mouth 2 (two) times daily. Take for ten days. 03/21/19   Chancy Milroy, MD                                                                                                                                    Past Surgical History Past Surgical History:  Procedure Laterality Date  . CESAREAN SECTION    . ECTOPIC PREGNANCY SURGERY  2003  . INCISION AND DRAINAGE Left 12/25/2015   Procedure: INCISION AND DRAINAGE With  Amputation of distal tip of  Left Great and Second Toe.;  Surgeon: Leandrew Koyanagi, MD;  Location: [facility];  Service: Orthopedics;  Laterality: Left;  . LIPOMA EXCISION     chest  . ORIF HUMERUS FRACTURE Left 07/15/2014   Procedure: OPEN REDUCTION INTERNAL FIXATION (ORIF) LEFT HUMERAL SHAFT ;  Surgeon: Renette Butters, MD;  Location: [facility];  Service: Orthopedics;  Laterality: Left;  . TUBAL LIGATION  2003   Family History History reviewed. No pertinent family history.  Social History Social History   Tobacco Use  . Smoking status: Current Every Day Smoker    Years: 18.00    Types: Cigarettes  . Smokeless tobacco: Never Used  . Tobacco comment: 6 cig./day  Substance Use Topics  . Alcohol use: Yes    Comment: occasionally  . Drug use: Yes    Types: Marijuana, "Crack" cocaine, MDMA (Ecstacy)    Comment: Crack, THC, Molly   Allergies Patient has no known allergies.  Review of Systems Review of Systems All other systems are reviewed and are negative for acute change except as noted in the HPI  Physical Exam Vital Signs  I have reviewed the triage vital signs BP (!) 164/71   Pulse 100   Temp 98.3 F (36.8 C)  (Oral)   Resp 17   Ht 5\' 11"  (1.803 m)   Wt 129.7 kg   LMP 09/28/2020   SpO2 100%   BMI 39.89 kg/m   Physical Exam Vitals reviewed.  Constitutional:      General: She is not in acute distress.    Appearance: She is well-developed. She is not diaphoretic.  HENT:     Head: Normocephalic and atraumatic.     Right Ear: External ear normal.     Left Ear: External ear normal.     Nose: Nose normal.  Eyes:     General: No scleral icterus.    Conjunctiva/sclera: Conjunctivae normal.  Neck:     Trachea: Phonation normal.  Cardiovascular:     Rate and Rhythm: Normal rate and regular rhythm.  Pulmonary:     Effort: Pulmonary effort is normal. No respiratory distress.     Breath sounds: No stridor.  Chest:     Chest wall: No tenderness.    Abdominal:     General: There is no distension.  Musculoskeletal:        General: Normal range of motion.     Cervical back: Normal range of motion.     Right ankle: Swelling present. No ecchymosis. Tenderness present over the ATF ligament and AITF ligament. No base of 5th metatarsal tenderness. Normal range of motion.  Skin:    Findings: Rash present. Rash is macular (red, to bilateral chest wall and breast).  Neurological:     Mental Status: She is alert and oriented to person, place, and time.  Psychiatric:        Behavior: Behavior normal.     ED Results and Treatments Labs (all labs ordered are listed, but only abnormal results are displayed) Labs Reviewed - No data to display  EKG  EKG Interpretation  Date/Time:    Ventricular Rate:    PR Interval:    QRS Duration:   QT Interval:    QTC Calculation:   R Axis:     Text Interpretation:        Radiology DG Ankle Complete Right  Result Date: 09/28/2020 CLINICAL DATA:  Ankle injury EXAM: RIGHT ANKLE - COMPLETE 3+ VIEW COMPARISON:  None. FINDINGS: There is no  evidence of fracture, dislocation, or joint effusion. There is no evidence of arthropathy or other focal bone abnormality. Soft tissues are unremarkable. Small plantar calcaneal spur IMPRESSION: Negative. Electronically Signed   By: Donavan Foil M.D.   On: 09/28/2020 03:56    Pertinent labs & imaging results that were available during my care of the patient were reviewed by me and considered in my medical decision making (see chart for details).  Medications Ordered in ED Medications  naproxen (NAPROSYN) tablet 500 mg (500 mg Oral Given 09/28/20 0510)                                                                                                                                    Procedures Procedures  (including critical care time)  Medical Decision Making / ED Course I have reviewed the nursing notes for this encounter and the patient's prior records (if available in EHR or on provided paperwork).   TURKESSA OSTROM was evaluated in Emergency Department on 09/28/2020 for the symptoms described in the history of present illness. She was evaluated in the context of the global COVID-19 pandemic, which necessitated consideration that the patient might be at risk for infection with the SARS-CoV-2 virus that causes COVID-19. Institutional protocols and algorithms that pertain to the evaluation of patients at risk for COVID-19 are in a state of rapid change based on information released by regulatory bodies including the CDC and federal and state organizations. These policies and algorithms were followed during the patient's care in the ED.  1. Ankle pain. Plain film negative. Likely sprain. Low suspicion for DVT. Provided with a cam walker.  2.  Bilateral chest wall rash. Consistent with contact dermatitis likely from new soap.  Supportive management recommended       Final Clinical Impression(s) / ED Diagnoses Final diagnoses:  Sprain of anterior talofibular ligament of right ankle,  initial encounter  Irritant contact dermatitis due to detergent   The patient appears reasonably screened and/or stabilized for discharge and I doubt any other medical condition or other [facility] requiring further screening, evaluation, or treatment in the ED at this time prior to discharge. Safe for discharge with strict return precautions.  Disposition: Discharge  Condition: Good  I have discussed the results, Dx and Tx plan with the patient/family who expressed understanding and agree(s) with the plan. Discharge instructions discussed at length. The patient/family was given strict return precautions who verbalized understanding of the instructions. No further questions at time  of discharge.    ED Discharge Orders    None       Follow Up: Center, [place] [id number] [phone]  Call  as needed      This chart was dictated using voice recognition software.  Despite best efforts to proofread,  errors can occur which can change the documentation meaning.   Fatima Blank, MD 09/28/20 [phone]

## 2020-09-28 NOTE — ED Triage Notes (Signed)
Emergency Medicine Provider Triage Evaluation Note  AMIEE WILEY , a 45 y.o. female  was evaluated in triage.  Pt complains of constant R foot pain x 1 week. Pain is sharp, burning and radiates toward the R knee. No known trauma or injury, though triage note references a fall 1 week ago. Symptoms associated with migratory swelling of the foot. Patient took some ibuprofen a few days ago without relief. No fevers.  Review of Systems  Positive: Foot pain Negative: fevers  Physical Exam  Ht 5\' 11"  (1.803 m)   Wt 129.7 kg   LMP 09/28/2020   BMI 39.89 kg/m  Gen:   Awake, no distress   HEENT:  Atraumatic  Resp:  Normal effort  Cardiac:  Normal rate. DP pulse 2+ RLE Abd:   Nondistended, nontender  MSK:   Moves extremities without difficulty. Patient able to wiggle all toes Neuro:  Speech clear   Medical Decision Making  Medically screening exam initiated at 3:42 AM.  Appropriate orders placed.  TANETTE CHAUCA was informed that the remainder of the evaluation will be completed by another provider, this initial triage assessment does not replace that evaluation, and the importance of remaining in the ED until their evaluation is complete.  Clinical Impression  R foot pain   Antonietta Breach, PA-C 09/28/20 3810

## 2020-09-28 NOTE — ED Triage Notes (Signed)
Patient arrives from home with PTAR, reports she fell 1 week ago and turned her R ankle, reports now it is still swollen, also with rash to L breast.

## 2020-09-28 NOTE — Progress Notes (Signed)
Orthopedic Tech Progress Note Patient Details:  Julie [name] Dec 20, 1975 [id number]  Ortho Devices Type of Ortho Device: CAM walker Ortho Device/Splint Location: rle Ortho Device/Splint Interventions: Ordered,Application,Adjustment   Post Interventions Patient Tolerated: Well Instructions Provided: Care of device,Adjustment of device   Karolee Stamps 09/28/2020, 6:29 AM

## 2021-01-10 ENCOUNTER — Other Ambulatory Visit: Payer: Self-pay

## 2021-01-10 ENCOUNTER — Encounter (HOSPITAL_COMMUNITY): Payer: Self-pay | Admitting: Emergency Medicine

## 2021-01-10 ENCOUNTER — Emergency Department (HOSPITAL_COMMUNITY)
Admission: EM | Admit: 2021-01-10 | Discharge: 2021-01-10 | Disposition: A | Discharge status: Home / Self Care | Payer: Medicaid Other | Attending: Emergency Medicine | Admitting: Emergency Medicine

## 2021-01-10 DIAGNOSIS — F1490 Cocaine use, unspecified, uncomplicated: Secondary | ICD-10-CM | POA: Insufficient documentation

## 2021-01-10 DIAGNOSIS — F1010 Alcohol abuse, uncomplicated: Secondary | ICD-10-CM | POA: Diagnosis not present

## 2021-01-10 DIAGNOSIS — Z5321 Procedure and treatment not carried out due to patient leaving prior to being seen by health care provider: Secondary | ICD-10-CM | POA: Insufficient documentation

## 2021-01-10 NOTE — ED Notes (Signed)
Pt states she does not want detox, told staff that her step-mother requested that she been seen, but she personally did not want to be seen.

## 2021-01-10 NOTE — ED Triage Notes (Signed)
Patient states she is here for help with drugs (cocaine) and alcohol use (two beers this AM). Normally does cocaine daily and drinks 3-4x weekly.  Denies SI/HI. States she needs some resources.

## 2021-09-19 ENCOUNTER — Emergency Department (HOSPITAL_COMMUNITY): Payer: Medicaid Other

## 2021-09-19 ENCOUNTER — Emergency Department (HOSPITAL_COMMUNITY)
Admission: EM | Admit: 2021-09-19 | Discharge: 2021-09-20 | Disposition: A | Discharge status: Home / Self Care | Payer: Medicaid Other | Attending: Emergency Medicine | Admitting: Emergency Medicine

## 2021-09-19 ENCOUNTER — Other Ambulatory Visit: Payer: Self-pay

## 2021-09-19 ENCOUNTER — Encounter (HOSPITAL_COMMUNITY): Payer: Self-pay | Admitting: Emergency Medicine

## 2021-09-19 DIAGNOSIS — S93401A Sprain of unspecified ligament of right ankle, initial encounter: Secondary | ICD-10-CM | POA: Diagnosis not present

## 2021-09-19 DIAGNOSIS — W501XXA Accidental kick by another person, initial encounter: Secondary | ICD-10-CM | POA: Diagnosis not present

## 2021-09-19 DIAGNOSIS — S99911A Unspecified injury of right ankle, initial encounter: Secondary | ICD-10-CM | POA: Diagnosis present
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
## 2021-09-20 NOTE — Progress Notes (Signed)
Orthopedic Tech Progress Note ?Patient Details:  ?Julie [name] ?December 22, 1975 ?[id number] ? ?Ortho Devices ?Type of Ortho Device: Crutches, ASO ?Ortho Device/Splint Location: rle ?Ortho Device/Splint Interventions: Ordered, Application, Adjustment ?  ?Post Interventions ?Patient Tolerated: Well ?Instructions Provided: Care of device, Adjustment of device ? ?Julie [name] ?09/20/2021, 1:55 AM ? ?

## 2021-09-20 NOTE — Discharge Instructions (Signed)
You can take Tylenol or ibuprofen, available over-the-counter according to label instructions as needed for pain. ?

## 2021-09-20 NOTE — ED Notes (Signed)
Ortho tech at bedside 
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
## 2021-09-25 ENCOUNTER — Encounter (HOSPITAL_COMMUNITY): Payer: Self-pay | Admitting: Pharmacy Technician

## 2021-09-25 ENCOUNTER — Emergency Department (HOSPITAL_COMMUNITY)
Admission: EM | Admit: 2021-09-25 | Discharge: 2021-09-25 | Disposition: A | Discharge status: Home / Self Care | Payer: Medicaid Other | Attending: Emergency Medicine | Admitting: Emergency Medicine

## 2021-09-25 ENCOUNTER — Other Ambulatory Visit: Payer: Self-pay

## 2021-09-25 DIAGNOSIS — B3731 Acute candidiasis of vulva and vagina: Secondary | ICD-10-CM

## 2021-09-25 DIAGNOSIS — R739 Hyperglycemia, unspecified: Secondary | ICD-10-CM

## 2021-09-25 LAB — URINALYSIS, ROUTINE W REFLEX MICROSCOPIC
Bilirubin Urine: NEGATIVE
Glucose, UA: >=500 mg/dL — AB
Hgb urine dipstick: NEGATIVE
Ketones, ur: NEGATIVE mg/dL
Nitrite: NEGATIVE
Protein, ur: NEGATIVE mg/dL
Renal Epithelial: <1
Specific Gravity, Urine: 1.024 (ref 1.005–1.030)
pH: 6 (ref 5.0–8.0)

## 2021-09-25 LAB — CBC WITH DIFFERENTIAL/PLATELET
Abs Immature Granulocytes: 0.02 10*3/uL (ref 0.00–0.07)
Basophils Absolute: 0.1 10*3/uL (ref 0.0–0.1)
Basophils Relative: 1 %
Eosinophils Absolute: 0.1 10*3/uL (ref 0.0–0.5)
Eosinophils Relative: 1 %
HCT: 44.6 % (ref 36.0–46.0)
Hemoglobin: 14.5 g/dL (ref 12.0–15.0)
Immature Granulocytes: 0 %
Lymphocytes Relative: 33 %
Lymphs Abs: 3.2 10*3/uL (ref 0.7–4.0)
MCH: 27.8 pg (ref 26.0–34.0)
MCHC: 32.5 g/dL (ref 30.0–36.0)
MCV: 85.6 fL (ref 80.0–100.0)
Monocytes Absolute: 0.9 10*3/uL (ref 0.1–1.0)
Monocytes Relative: 9 %
Neutro Abs: 5.5 10*3/uL (ref 1.7–7.7)
Neutrophils Relative %: 56 %
Platelets: 276 10*3/uL (ref 150–400)
RBC: 5.21 MIL/uL — ABNORMAL HIGH (ref 3.87–5.11)
RDW: 13.3 % (ref 11.5–15.5)
WBC: 9.7 10*3/uL (ref 4.0–10.5)
nRBC: 0 % (ref 0.0–0.2)

## 2021-09-25 LAB — PREGNANCY, URINE: Preg Test, Ur: NEGATIVE

## 2021-09-25 LAB — BASIC METABOLIC PANEL
Anion gap: 8 (ref 5–15)
BUN: 8 mg/dL (ref 6–20)
CO2: 25 mmol/L (ref 22–32)
Calcium: 10.6 mg/dL — ABNORMAL HIGH (ref 8.9–10.3)
Chloride: 100 mmol/L (ref 98–111)
Creatinine, Ser: 0.72 mg/dL (ref 0.44–1.00)
GFR, Estimated: >60 mL/min (ref >60)
Glucose, Bld: 493 mg/dL — ABNORMAL HIGH (ref 70–99)
Potassium: 4 mmol/L (ref 3.5–5.1)
Sodium: 133 mmol/L — ABNORMAL LOW (ref 135–145)

## 2021-09-25 LAB — WET PREP, GENITAL
Clue Cells Wet Prep HPF POC: NONE SEEN
Sperm: NONE SEEN
Trich, Wet Prep: NONE SEEN
WBC, Wet Prep HPF POC: >=10 — AB (ref <10)

## 2021-09-25 LAB — CBG MONITORING, ED
Glucose-Capillary: 402 mg/dL — ABNORMAL HIGH (ref 70–99)
Glucose-Capillary: 444 mg/dL — ABNORMAL HIGH (ref 70–99)

## 2021-09-25 MED ORDER — INSULIN ASPART 100 UNIT/ML IJ SOLN
12.0000 [IU] | Freq: Once | INTRAMUSCULAR | Status: AC
  Administered 2021-09-25: 12 [IU] via SUBCUTANEOUS

## 2021-09-25 MED ORDER — FLUCONAZOLE 200 MG PO TABS
200.0000 mg | ORAL_TABLET | Freq: Once | ORAL | Status: AC
Start: 2021-09-25 — End: 2021-09-25
  Administered 2021-09-25: 200 mg via ORAL
  Filled 2021-09-25: qty 1

## 2021-09-25 MED ORDER — METFORMIN HCL 1000 MG PO TABS: 1000.0000 mg | ORAL_TABLET | Freq: Two times a day (BID) | ORAL | 0 refills | Status: DC

## 2021-09-25 NOTE — Discharge Instructions (Addendum)
Increase your metformin to 1000 mg twice daily.   ? ?Call your primary care doctor or specialist as discussed in the next 2-3 days.   ?Return immediately back to the ER if: ? ?Your symptoms worsen within the next 12-24 hours. ?You develop new symptoms such as new fevers, persistent vomiting, new pain, shortness of breath, or new weakness or numbness, or if you have any other concerns. ? ?

## 2021-09-25 NOTE — ED Provider Notes (Signed)
?[facility] ?Provider Note ? ? ?CSN: [id number] ?Arrival date & time: 09/25/21  1618 ? ?  ? ?History ? ?No chief complaint on file. ? ? ?Julie [name] is a 46 y.o. female. ? ?Patient presenting with chief complaint of a rash in her perineum region ongoing for 1 week.  Denies any fevers or cough no vomiting or diarrhea.  She states that she has a history of prediabetes.  Denies any sexual intercourse for the past 6 months. ? ? ?  ? ?Home Medications ?Prior to Admission medications   ?Medication Sig Start Date End Date Taking? Authorizing Provider  ?metFORMIN (GLUCOPHAGE) 1000 MG tablet Take 1 tablet (1,000 mg total) by mouth 2 (two) times daily. 09/25/21  Yes Luna Fuse, MD  ?acetaminophen (TYLENOL) 500 MG tablet Take 500 mg by mouth every 6 (six) hours as needed.    [provider]  ?cephALEXin (KEFLEX) 500 MG capsule Take 1 capsule (500 mg total) by mouth 2 (two) times daily. 05/04/20   Montine Circle, PA-C  ?fluconazole (DIFLUCAN) 100 MG tablet Take a tablet qd x 5 days 03/21/19   Chancy Milroy, MD  ?HYDROcodone-acetaminophen (NORCO/VICODIN) 5-325 MG tablet Take 1 tablet by mouth every 6 (six) hours as needed for moderate pain. 04/21/19   Domenic Moras, PA-C  ?valACYclovir (VALTREX) 1000 MG tablet Take 1 tablet (1,000 mg total) by mouth 2 (two) times daily. Take for ten days. 03/21/19   Chancy Milroy, MD  ?   ? ?Allergies    ?Patient has no known allergies.   ? ?Review of Systems   ?Review of Systems  ?Constitutional:  Negative for fever.  ?HENT:  Negative for ear pain.   ?Eyes:  Negative for pain.  ?Respiratory:  Negative for cough.   ?Cardiovascular:  Negative for chest pain.  ?Gastrointestinal:  Negative for abdominal pain.  ?Genitourinary:  Negative for flank pain.  ?Musculoskeletal:  Negative for back pain.  ?Skin:  Negative for rash.  ?Neurological:  Negative for headaches.  ? ?Physical Exam ?Updated Vital Signs ?BP (!) 157/110   Pulse (!) 114   Temp  98.6 ?F (37 ?C) (Oral)   Resp 19   SpO2 97%  ?Physical Exam ?Constitutional:   ?   General: She is not in acute distress. ?   Appearance: Normal appearance.  ?HENT:  ?   Head: Normocephalic.  ?   Nose: Nose normal.  ?Eyes:  ?   Extraocular Movements: Extraocular movements intact.  ?Cardiovascular:  ?   Rate and Rhythm: Normal rate.  ?Pulmonary:  ?   Effort: Pulmonary effort is normal.  ?Musculoskeletal:     ?   General: Normal range of motion.  ?   Cervical back: Normal range of motion.  ?Skin: ?   Comments: White discharge seen in the perineum region.  No cervical motion tenderness noted.  No ulcerative lesions noted.  ?Neurological:  ?   General: No focal deficit present.  ?   Mental Status: She is alert. Mental status is at baseline.  ? ? ?ED Results / Procedures / Treatments   ?Labs ?(all labs ordered are listed, but only abnormal results are displayed) ?Labs Reviewed  ?WET PREP, GENITAL - Abnormal; Notable for the following components:  ?    Result Value  ? Yeast Wet Prep HPF POC PRESENT (*)   ? WBC, Wet Prep HPF POC >=10 (*)   ? All other components within normal limits  ?URINALYSIS, ROUTINE W REFLEX MICROSCOPIC -  Abnormal; Notable for the following components:  ? Color, Urine STRAW (*)   ? APPearance HAZY (*)   ? Glucose, UA >=500 (*)   ? Leukocytes,Ua TRACE (*)   ? Bacteria, UA RARE (*)   ? All other components within normal limits  ?CBC WITH DIFFERENTIAL/PLATELET - Abnormal; Notable for the following components:  ? RBC 5.21 (*)   ? All other components within normal limits  ?BASIC METABOLIC PANEL - Abnormal; Notable for the following components:  ? Sodium 133 (*)   ? Glucose, Bld 493 (*)   ? Calcium 10.6 (*)   ? All other components within normal limits  ?CBG MONITORING, ED - Abnormal; Notable for the following components:  ? Glucose-Capillary 402 (*)   ? All other components within normal limits  ?CBG MONITORING, ED - Abnormal; Notable for the following components:  ? Glucose-Capillary 444 (*)   ? All  other components within normal limits  ?PREGNANCY, URINE  ? ? ?EKG ?None ? ?Radiology ?No results found. ? ?Procedures ?Procedures  ? ? ?Medications Ordered in ED ?Medications  ?insulin aspart (novoLOG) injection 12 Units (12 Units Subcutaneous Given 09/25/21 2051)  ?fluconazole (DIFLUCAN) tablet 200 mg (200 mg Oral Given 09/25/21 2110)  ?insulin aspart (novoLOG) injection 12 Units (12 Units Subcutaneous Given 09/25/21 2247)  ? ? ?ED Course/ Medical Decision Making/ A&P ?  ?                        ?Medical Decision Making ?Amount and/or Complexity of Data Reviewed ?Labs: ordered. ? ?Risk ?Prescription drug management. ? ? ?Chart review shows visit 1 week ago for a sprained ankle in the ER. ? ?Labs are sent showing normal WBC normal hemoglobin.  However glucose elevated 493.  Urinalysis shows only rare bacteria. ? ?Wet mount positive for yeast. ? ?Patient given treatment of fluconazole single dose here.  Given insulin for her hyperglycemia.  Will prescribe metformin, advised outpatient follow-up with her primary care doctor this week.  Recommending immediate return for worsening symptoms or any additional concerns. ? ?Addendum: Blood sugar did appear to be trending downwards with first treatment of insulin, however repeat shows it is trending back up.  When I discussed this with the patient and ask if she has been eating anything, she admits that she has a bag of chicken strips that she has been eating tonight. ? ?Patient will be prescribed metformin.  I advised her to follow-up with her OB/GYN doctor and primary care doctor in 2 to 3 days for her hyperglycemia.  Advised immediate return to the ER if she is unable to do so has worsening symptoms or any additional concerns. ? ? ? ? ? ? ? ? ?Final Clinical Impression(s) / ED Diagnoses ?Final diagnoses:  ?Hyperglycemia  ?Vaginal yeast infection  ? ? ?Rx / DC Orders ?ED Discharge Orders   ? ?      Ordered  ?  metFORMIN (GLUCOPHAGE) 1000 MG tablet  2 times daily       ?  09/25/21 2336  ? ?  ?  ? ?  ? ? ?  ?Luna Fuse, MD ?09/25/21 2336 ? ?

## 2021-09-25 NOTE — ED Triage Notes (Signed)
Pt here with reports of pain and discharge from vagina for several weeks.  ?

## 2021-09-25 NOTE — ED Notes (Signed)
All discharge instructions including follow up care and prescriptions reviewed with patient and patient verbalized understanding of same. Patient stable and ambulatory at time of discharge.  

## 2021-10-30 ENCOUNTER — Emergency Department (HOSPITAL_COMMUNITY)
Admission: EM | Admit: 2021-10-30 | Discharge: 2021-10-31 | Disposition: A | Discharge status: Home / Self Care | Payer: Medicaid Other | Attending: Emergency Medicine | Admitting: Emergency Medicine

## 2021-10-30 DIAGNOSIS — R112 Nausea with vomiting, unspecified: Secondary | ICD-10-CM | POA: Diagnosis present

## 2021-10-30 DIAGNOSIS — R739 Hyperglycemia, unspecified: Secondary | ICD-10-CM | POA: Diagnosis not present

## 2021-10-30 DIAGNOSIS — Z7984 Long term (current) use of oral hypoglycemic drugs: Secondary | ICD-10-CM | POA: Insufficient documentation

## 2021-10-30 DIAGNOSIS — L299 Pruritus, unspecified: Secondary | ICD-10-CM | POA: Insufficient documentation

## 2021-10-30 DIAGNOSIS — R7303 Prediabetes: Secondary | ICD-10-CM | POA: Insufficient documentation

## 2021-10-30 DIAGNOSIS — J45909 Unspecified asthma, uncomplicated: Secondary | ICD-10-CM | POA: Insufficient documentation

## 2021-10-30 DIAGNOSIS — R350 Frequency of micturition: Secondary | ICD-10-CM | POA: Insufficient documentation

## 2021-10-30 DIAGNOSIS — N739 Female pelvic inflammatory disease, unspecified: Secondary | ICD-10-CM

## 2021-10-30 DIAGNOSIS — A599 Trichomoniasis, unspecified: Secondary | ICD-10-CM

## 2021-10-30 DIAGNOSIS — N83201 Unspecified ovarian cyst, right side: Secondary | ICD-10-CM | POA: Insufficient documentation

## 2021-10-30 DIAGNOSIS — E278 Other specified disorders of adrenal gland: Secondary | ICD-10-CM

## 2021-10-31 ENCOUNTER — Emergency Department (HOSPITAL_COMMUNITY): Payer: Medicaid Other

## 2021-10-31 ENCOUNTER — Encounter (HOSPITAL_COMMUNITY): Payer: Self-pay

## 2021-10-31 ENCOUNTER — Other Ambulatory Visit: Payer: Self-pay

## 2021-10-31 LAB — WET PREP, GENITAL
Clue Cells Wet Prep HPF POC: NONE SEEN
Sperm: NONE SEEN
WBC, Wet Prep HPF POC: <10 (ref <10)
Yeast Wet Prep HPF POC: NONE SEEN

## 2021-10-31 LAB — CBC WITH DIFFERENTIAL/PLATELET
Abs Immature Granulocytes: 0.02 10*3/uL (ref 0.00–0.07)
Basophils Absolute: 0.1 10*3/uL (ref 0.0–0.1)
Basophils Relative: 1 %
Eosinophils Absolute: 0.1 10*3/uL (ref 0.0–0.5)
Eosinophils Relative: 2 %
HCT: 46.8 % — ABNORMAL HIGH (ref 36.0–46.0)
Hemoglobin: 14.6 g/dL (ref 12.0–15.0)
Immature Granulocytes: 0 %
Lymphocytes Relative: 42 %
Lymphs Abs: 3.5 10*3/uL (ref 0.7–4.0)
MCH: 27.3 pg (ref 26.0–34.0)
MCHC: 31.2 g/dL (ref 30.0–36.0)
MCV: 87.6 fL (ref 80.0–100.0)
Monocytes Absolute: 0.7 10*3/uL (ref 0.1–1.0)
Monocytes Relative: 8 %
Neutro Abs: 4.1 10*3/uL (ref 1.7–7.7)
Neutrophils Relative %: 47 %
Platelets: 261 10*3/uL (ref 150–400)
RBC: 5.34 MIL/uL — ABNORMAL HIGH (ref 3.87–5.11)
RDW: 14.6 % (ref 11.5–15.5)
WBC: 8.5 10*3/uL (ref 4.0–10.5)
nRBC: 0 % (ref 0.0–0.2)

## 2021-10-31 LAB — URINALYSIS, ROUTINE W REFLEX MICROSCOPIC
Bilirubin Urine: NEGATIVE
Glucose, UA: >=500 mg/dL — AB
Ketones, ur: NEGATIVE mg/dL
Nitrite: NEGATIVE
Protein, ur: NEGATIVE mg/dL
Specific Gravity, Urine: 1.022 (ref 1.005–1.030)
pH: 5 (ref 5.0–8.0)

## 2021-10-31 LAB — HCG, SERUM, QUALITATIVE: Preg, Serum: NEGATIVE

## 2021-10-31 LAB — CBG MONITORING, ED: Glucose-Capillary: 290 mg/dL — ABNORMAL HIGH (ref 70–99)

## 2021-10-31 LAB — COMPREHENSIVE METABOLIC PANEL
ALT: 25 U/L (ref 0–44)
AST: 23 U/L (ref 15–41)
Albumin: 3.9 g/dL (ref 3.5–5.0)
Alkaline Phosphatase: 67 U/L (ref 38–126)
Anion gap: 8 (ref 5–15)
BUN: 10 mg/dL (ref 6–20)
CO2: 23 mmol/L (ref 22–32)
Calcium: 10 mg/dL (ref 8.9–10.3)
Chloride: 106 mmol/L (ref 98–111)
Creatinine, Ser: 0.62 mg/dL (ref 0.44–1.00)
GFR, Estimated: >60 mL/min (ref >60)
Glucose, Bld: 298 mg/dL — ABNORMAL HIGH (ref 70–99)
Potassium: 4.2 mmol/L (ref 3.5–5.1)
Sodium: 137 mmol/L (ref 135–145)
Total Bilirubin: 0.5 mg/dL (ref 0.3–1.2)
Total Protein: 8 g/dL (ref 6.5–8.1)

## 2021-10-31 LAB — RPR: RPR Ser Ql: NONREACTIVE

## 2021-10-31 LAB — LIPASE, BLOOD: Lipase: 37 U/L (ref 11–51)

## 2021-10-31 LAB — HIV ANTIBODY (ROUTINE TESTING W REFLEX): HIV Screen 4th Generation wRfx: NONREACTIVE

## 2021-10-31 MED ORDER — IOHEXOL 300 MG/ML  SOLN
100.0000 mL | Freq: Once | INTRAMUSCULAR | Status: AC | PRN
  Administered 2021-10-31: 100 mL via INTRAVENOUS

## 2021-10-31 MED ORDER — DOXYCYCLINE HYCLATE 100 MG PO CAPS: 100.0000 mg | ORAL_CAPSULE | Freq: Two times a day (BID) | ORAL | 0 refills | Status: DC

## 2021-10-31 MED ORDER — CEPHALEXIN 500 MG PO CAPS: 500.0000 mg | ORAL_CAPSULE | Freq: Three times a day (TID) | ORAL | 0 refills | Status: DC

## 2021-10-31 MED ORDER — LIDOCAINE HCL 1 % IJ SOLN
INTRAMUSCULAR | Status: AC
  Administered 2021-10-31: 2.1 mL
  Filled 2021-10-31: qty 20

## 2021-10-31 MED ORDER — BISACODYL 5 MG PO TBEC: 5.0000 mg | DELAYED_RELEASE_TABLET | Freq: Two times a day (BID) | ORAL | 0 refills | Status: DC

## 2021-10-31 MED ORDER — CEFTRIAXONE SODIUM 1 G IJ SOLR
500.0000 mg | Freq: Once | INTRAMUSCULAR | Status: AC
  Administered 2021-10-31: 500 mg via INTRAMUSCULAR
  Filled 2021-10-31: qty 10

## 2021-10-31 MED ORDER — METRONIDAZOLE 500 MG PO TABS: 500.0000 mg | ORAL_TABLET | Freq: Two times a day (BID) | ORAL | 0 refills | Status: DC

## 2021-10-31 NOTE — ED Provider Triage Note (Signed)
Emergency Medicine Provider Triage Evaluation Note ? ?Julie [name] , a 46 y.o. female  was evaluated in triage.  Pt complains of vaginal irritation with multiple lesions which is extending to the rectum- painful, pruritic, associated dysuria, unsure regarding discharge. Recent tx for yeast infection. Also notes some N/V & urinary frequency, hx of DM.  ? ?Review of Systems  ?Per above.  ? ?Physical Exam  ?BP (!) 144/90 (BP Location: Left Arm)   Pulse 98   Temp 97.8 ?F (36.6 ?C) (Oral)   Resp 18   Ht '5\' 11"'$  (1.803 m)   Wt 127 kg   SpO2 94%   BMI 39.05 kg/m?  ?Gen:   Awake, no distress   ?Resp:  Normal effort  ?MSK:   Moves extremities without difficulty  ? ?Medical Decision Making  ?Medically screening exam initiated at 00:30AM  Appropriate orders placed.  Julie [name] was informed that the remainder of the evaluation will be completed by another provider, this initial triage assessment does not replace that evaluation, and the importance of remaining in the ED until their evaluation is complete. ? ?Vaginal irritation.  ?  Amaryllis Dyke, PA-C ?10/31/21 9485 ? ?

## 2021-10-31 NOTE — ED Notes (Signed)
Patient transported to CT 

## 2021-10-31 NOTE — Discharge Instructions (Addendum)
You are being treated for PID which I have printed information for you to read about ?Please take the antibiotics for the entire course even if your symptoms improve. 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  You will need to have a follow-up CT scan within 12 months ?You have been treated in the emergency department for an infection, possibly sexually transmitted. Results of your gonorrhea and chlamydia tests are pending and you will be notified if they are positive. Please refrain from intercourse for 7 days and until all sex partners (within previous 60 days) are evaluated and/or treated as well. Please follow up with your primary care provider for continued care and further STD evaluation.  It is very important to practice safe sex and use condoms when sexually active. If your results are positive you need to notify all sexual partners so they can be treated as well. The website https://dean.info/ can be used to send anonymous text messages or emails to alert sexual contacts. Follow up with your doctor, or OBGYN in regards to today's visit.  ? ?Gonorrhea and Chlamydia ?SYMPTOMS  ?In females, symptoms may go unnoticed. Symptoms that are more noticeable can include:  ?Belly (abdominal) pain.  ?Painful intercourse.  ?Watery mucous-like discharge from the vagina.  ?Miscarriage.  ?Discomfort when urinating.  ?Inflammation of the rectum.  ?Abnormal gray-green frothy vaginal discharge  ?Vaginal itching and irritatio  ?Itching and irritation of the area outside the vagina.   ?Painful urination.  ?Bleeding after sexual intercourse.  ?In males, symptoms include:  ?Burning with urination.  ?Pain in the testicles.  ?Watery mucous-like discharge from the penis.  ?It can cause longstanding (chronic) pelvic pain after frequent infections.  ?TREATMENT  ?PID can cause women to not be able to have children (sterile) if left untreated  or if half-treated.  It is important to finish ALL medications given to you.  ?This is a sexually transmitted infection. So you are also at risk for other sexually transmitted diseases, including HIV (AIDS), it is recommended that you get tested. ?HOME CARE INSTRUCTIONS  ?Warning: This infection is contagious. Do not have sex until treatment is completed. Follow up at your caregiver's office or the clinic to which you were referred. If your diagnosis (learning what is wrong) is confirmed by culture or some other method, your recent sexual contacts need treatment. Even if they are symptom free or have a negative culture or evaluation, they should be treated.  ?PREVENTION  ?Women should use sanitary pads instead of tampons for vaginal discharge.  ?Wipe front to back after using the toilet and avoid douching.   ?Practice safe sex, use condoms, have only one sex partner and be sure your sex partner is not having sex with others.  ?Ask your caregiver to test you for chlamydia at your regular checkups or sooner if you are having symptoms.  ?Ask for further information if you are pregnant.  ?SEEK IMMEDIATE MEDICAL CARE IF:  ?You develop an oral temperature above 102? F (38.9? C), not controlled by medications or lasting more than 2 days.  ?You develop an increase in pain.  ?You develop any type of abnormal discharge.  ?You develop vaginal bleeding and it is not time for your period.  ?You develop painful intercourse.  ? ?Bacterial Vaginosis  ?Bacterial vaginosis (BV) is a vaginal infection where the normal balance of bacteria in the vagina is disrupted. This is  not a sexually transmitted disease and your sexual partners do NOT need to be treated. ?CAUSES  ?The cause of BV is not fully understood. BV develops when there is an increase or imbalance of harmful bacteria.  ?Some activities or behaviors can upset the normal balance of bacteria in the vagina and put women at increased risk including:  ?Having a new sex partner or  multiple sex partners.  ?Douching.  ?Using an intrauterine device (IUD) for contraception.  ?It is not clear what role sexual activity plays in the development of BV. However, women that have never had sexual intercourse are rarely infected with BV.  ?Women do not get BV from toilet seats, bedding, swimming pools or from touching objects around them.  ? ?SYMPTOMS  ?Grey vaginal discharge.  ?A fish-like odor with discharge, especially after sexual intercourse.  ?Itching or burning of the vagina and vulva.  ?Burning or pain with urination.  ?Some women have no signs or symptoms at all.  ? ?TREATMENT  ?Sometimes BV will clear up without treatment.  ?BV may be treated with antibiotics.  ?BV can recur after treatment. If this happens, a second round of antibiotics will often be prescribed.  ?HOME CARE INSTRUCTIONS  ?Finish all medication as directed by your caregiver.  ?Do not have sex until treatment is completed.  ?Do NOT drink any alcoholic beverages while being treated  with Metronidazole (Flagyl). This will cause a severe reaction inducing vomiting. ? ?RESOURCE GUIDE ? ?Dental Problems ? ?Patients with Medicaid: ?[place] ?Phone:  [phone]                                                  Phone:  [phone] ? ?If unable to pay or uninsured, contact:  [place] or [facility]. to become qualified for the adult dental clinic. ? ?Chronic Pain Problems ?[facility]  [phone] ?Patients need to be referred by their primary care doctor. ? ?Insufficient Money for Medicine ?Contact [employer]:  call "211" or [place] [phone]. ? ?No Primary Care Doctor ?Call [facility]  [phone] ?Other agencies that provide inexpensive medical care ?   [facility]  [phone] ?   [facility]  [phone] ?   [place]  [phone] ?   [facility]  [phone] ?   [facility]  [phone] ?   [facility]  [phone] ? ?Psychological Services ?[place]  [phone] ?[facility]  [phone] ?[place] [phone] (emergency services [phone]) ? ?Substance Abuse Resources ?[facility]  [phone] ?[facility] [phone] ?[facility] [phone] ?[facility] [phone] ?Residential & Outpatient Substance Abuse Program  [phone] ? ?Abuse/Neglect ?[place] [phone] ?[place] [phone] (After Hours) ? ?Emergency Shelter ?[facility] [phone] ? ?Maternity Homes ?Room at the [facility] [phone] ?[place] [phone] ? ?MRSA Hotline #:   939-0300 ? ? ? ?[place] ? ?[facility] ?[address]  ?[place] ?Phone:  [phone]                                   Phone:  [phone]                 Phone:  [phone] ? ?[place] ?Phone:  [phone] ? ?[place] ?[phone] ?([phone] (After Hours) ?  ?

## 2021-10-31 NOTE — Care Management (Signed)
Consult to [facility]  for medication assistance. The patient has [employer], therefore is not eligible for [facility] medication assistance. [facility] is listted iin patient instruction, she will need to call Monday to schedule and appointment and get in there, as they have financial Counsellor that can assist patient for medications. ?

## 2021-10-31 NOTE — ED Triage Notes (Signed)
Pt states that she has a herpes rash to her vagina and anus that is burning and itching x 1 week.  ?

## 2021-10-31 NOTE — ED Provider Notes (Signed)
?[facility] DEPT ?Provider Note ? ? ?CSN: [id number] ?Arrival date & time: 10/30/21  2345 ? ?  ? ?History ? ?Chief Complaint  ?Patient presents with  ? Rash  ? Vomiting  ? ? ?Julie [name] is a 46 y.o. female with a hx of asthma, tobacco use, cocaine use, trichomoniasis, and prediabetes who presents to the emergency department with complaints of vaginal irritation for the past couple of weeks.  Patient states that she has a rash that is itchy to her vagina is extending to the rectal area.  She states it is not painful unless she scratches it a lot.  She has not noted any discharge specifically.  Reports only 1 new sexual partner in the past 7 months and has not had any known exposure to STDs.  She also notes some nausea with a couple episodes of emesis for the past few days as well as some stomach upset after eating.  She reports some urinary frequency however this has been ongoing for a while as well.  No other alleviating/aggravating factors.  Denies fever, chills, hematemesis, melena, hematochezia, diarrhea, chest pain, or dyspnea. ? ?HPI ? ?  ? ?Home Medications ?Prior to Admission medications   ?Medication Sig Start Date End Date Taking? Authorizing Provider  ?acetaminophen (TYLENOL) 500 MG tablet Take 500 mg by mouth every 6 (six) hours as needed.    [provider]  ?cephALEXin (KEFLEX) 500 MG capsule Take 1 capsule (500 mg total) by mouth 2 (two) times daily. 05/04/20   Montine Circle, PA-C  ?fluconazole (DIFLUCAN) 100 MG tablet Take a tablet qd x 5 days 03/21/19   Chancy Milroy, MD  ?HYDROcodone-acetaminophen (NORCO/VICODIN) 5-325 MG tablet Take 1 tablet by mouth every 6 (six) hours as needed for moderate pain. 04/21/19   Domenic Moras, PA-C  ?metFORMIN (GLUCOPHAGE) 1000 MG tablet Take 1 tablet (1,000 mg total) by mouth 2 (two) times daily. 09/25/21   Luna Fuse, MD  ?valACYclovir (VALTREX) 1000 MG tablet Take 1 tablet (1,000 mg total) by mouth 2 (two) times  daily. Take for ten days. 03/21/19   Chancy Milroy, MD  ?   ? ?Allergies    ?Patient has no known allergies.   ? ?Review of Systems   ?Review of Systems  ?Constitutional:  Negative for chills and fever.  ?Respiratory:  Negative for shortness of breath.   ?Cardiovascular:  Negative for chest pain.  ?Gastrointestinal:  Positive for abdominal pain, nausea and vomiting. Negative for blood in stool, constipation and diarrhea.  ?Genitourinary:  Positive for dysuria and frequency. Negative for vaginal discharge.  ?Skin:  Positive for rash.  ?All other systems reviewed and are negative. ? ?Physical Exam ?Updated Vital Signs ?BP (!) 144/90 (BP Location: Left Arm)   Pulse 98   Temp 97.8 ?F (36.6 ?C) (Oral)   Resp 18   Ht '5\' 11"'$  (1.803 m)   Wt 127 kg   SpO2 94%   BMI 39.05 kg/m?  ?Physical Exam ?Vitals and nursing note reviewed. Exam conducted with a chaperone present.  ?Constitutional:   ?   General: She is not in acute distress. ?   Appearance: She is well-developed. She is not toxic-appearing.  ?HENT:  ?   Head: Normocephalic and atraumatic.  ?Eyes:  ?   General:     ?   Right eye: No discharge.     ?   Left eye: No discharge.  ?   Conjunctiva/sclera: Conjunctivae normal.  ?Cardiovascular:  ?  Rate and Rhythm: Normal rate and regular rhythm.  ?Pulmonary:  ?   Effort: No respiratory distress.  ?   Breath sounds: Normal breath sounds. No wheezing or rales.  ?Abdominal:  ?   General: There is no distension.  ?   Palpations: Abdomen is soft.  ?   Tenderness: There is abdominal tenderness in the right lower quadrant. There is no guarding or rebound. Negative signs include Murphy's sign.  ?Genitourinary: ?   Comments: Patient has some chronically appearing inflamed hair follicles to the mons pubis with a few to the anogenital region.  No vesicles or ulcerative lesions noted.  No palpable crepitus, induration, or fluctuance. Some skin breakdown @ vaginal introitus. Speculum exam with mild amount of white discharge  present.  No bleeding.  Right adnexal tenderness on exam. ?Musculoskeletal:  ?   Cervical back: Neck supple.  ?Skin: ?   General: Skin is warm and dry.  ?Neurological:  ?   Mental Status: She is alert.  ?   Comments: Clear speech.   ?Psychiatric:     ?   Behavior: Behavior normal.  ? ? ?ED Results / Procedures / Treatments   ?Labs ?(all labs ordered are listed, but only abnormal results are displayed) ?Labs Reviewed  ?CBC WITH DIFFERENTIAL/PLATELET - Abnormal; Notable for the following components:  ?    Result Value  ? RBC 5.34 (*)   ? HCT 46.8 (*)   ? All other components within normal limits  ?CBG MONITORING, ED - Abnormal; Notable for the following components:  ? Glucose-Capillary 290 (*)   ? All other components within normal limits  ?WET PREP, GENITAL  ?RPR  ?URINALYSIS, ROUTINE W REFLEX MICROSCOPIC  ?HIV ANTIBODY (ROUTINE TESTING W REFLEX)  ?COMPREHENSIVE METABOLIC PANEL  ?LIPASE, BLOOD  ?HCG, SERUM, QUALITATIVE  ?GC/CHLAMYDIA PROBE AMP ([place]) NOT AT [facility]  ? ? ?EKG ?None ? ?Radiology ?No results found. ? ?Procedures ?Procedures  ? ? ?Medications Ordered in ED ?Medications - No data to display ? ?ED Course/ Medical Decision Making/ A&P ?  ?                        ?Medical Decision Making ?Amount and/or Complexity of Data Reviewed ?Labs: ordered. ? ? ?Patient presents to the ED with complaints of vaginal irritation, vomiting, and abdominal pain, this involves an extensive number of treatment options, and is a complaint that carries with it a high risk of complications and morbidity. Nontoxic, vitals with elevated blood pressure, doubt hypertensive emergency..  ? ?Additional history obtained:  ?Chart & nursing note reviewed.  ?Imaging viewed-  ?Pelvis US studies done previously:  ?02/17/22:  ?Cysts  ?1. 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 There is ?blood flow within the posterior thickened wall but not definitively ?within the  nodularity. Recommend consultation with a gynecologist ?and a pelvic MRI for further evaluation. A neoplasm/malignancy is ?not excluded. ?2. No torsion identified in the right ovary today's study. ?3. The left ovary was not visualized. ?03/20/22:  ?Resolution of probable hemorrhagic cyst of the RIGHT ovary since the ?previous exam; no RIGHT ovarian mass now identified. ?Small uterine fibroid at posterior uterine fundus. ? ?Lab Tests:  ?I viewed & interpreted labs including:  ?CBC: Unremarkable ?CMP: Hyperglycemia without acidosis or anion gap elevation ?Lipase: Within normal limits ?Pregnancy test: Negative ?Urinalysis: Many bacteria, however only small leukocytes present, nitrite negative, sent for culture given  her dysuria ?Additional STI testing including HIV, RPR, and GC/chlamydia are pending. ? ?On exam patient has right lower quadrant abdominal tenderness as well as right adnexal tenderness.  She has had prior ultrasound with complex cystic mass, however on repeat ultrasound was found to have resolution of probable hemorrhagic cyst.  Patient does have tenderness at this location today.  She has not been having pain there previously, however feel that torsion would be somewhat less likely, also noted to have trich on wet prep- will plan for CT abdomen/pelvis for further assessment.  ? ?6:30: Patient care signed out to [facility] @ shift change pending CT abdomen/pelvis and disposition.  Feel patient warrants treatment for PID, additional care per CT.  Patient also with difficulty affording her medications from her last ED visit including metformin, she was never able to fill prescriptions, will place a consult to TOC. ? ?Patient updated on results & plan of care.  ? ?Portions of this note were generated with Lobby[employer]. Dictation errors may occur despite best attempts at proofreading. ? ? ? ? ? ? ? ? ?Final Clinical Impression(s) / ED Diagnoses ?Final diagnoses:  ?None  ? ? ?Rx / DC Orders ?ED  Discharge Orders   ? ? None  ? ?  ? ? ?  ?Amaryllis Dyke, [place] ?10/31/21 7062 ? ?  ?Deno Etienne, DO ?10/31/21 [phone] ? ?

## 2021-11-01 LAB — URINE CULTURE: Culture: >=100000 — AB

## 2021-11-02 LAB — GC/CHLAMYDIA PROBE AMP (~~LOC~~) NOT AT ARMC
Chlamydia: NEGATIVE
Comment: NEGATIVE
Comment: NORMAL
Neisseria Gonorrhea: NEGATIVE

## 2021-11-04 DIAGNOSIS — L304 Erythema intertrigo: Secondary | ICD-10-CM | POA: Insufficient documentation

## 2021-11-04 DIAGNOSIS — R21 Rash and other nonspecific skin eruption: Secondary | ICD-10-CM | POA: Diagnosis present

## 2021-11-04 DIAGNOSIS — A598 Trichomoniasis of other sites: Secondary | ICD-10-CM | POA: Diagnosis not present

## 2021-11-04 DIAGNOSIS — L739 Follicular disorder, unspecified: Secondary | ICD-10-CM | POA: Diagnosis not present

## 2021-11-05 ENCOUNTER — Encounter (HOSPITAL_COMMUNITY): Payer: Self-pay

## 2021-11-05 ENCOUNTER — Emergency Department (HOSPITAL_COMMUNITY)
Admission: EM | Admit: 2021-11-05 | Discharge: 2021-11-05 | Disposition: A | Discharge status: Home / Self Care | Payer: Medicaid Other | Attending: Emergency Medicine | Admitting: Emergency Medicine

## 2021-11-05 ENCOUNTER — Other Ambulatory Visit: Payer: Self-pay

## 2021-11-05 DIAGNOSIS — L739 Follicular disorder, unspecified: Secondary | ICD-10-CM

## 2021-11-05 DIAGNOSIS — A599 Trichomoniasis, unspecified: Secondary | ICD-10-CM

## 2021-11-05 DIAGNOSIS — L304 Erythema intertrigo: Secondary | ICD-10-CM

## 2021-11-05 MED ORDER — DOXYCYCLINE HYCLATE 100 MG PO CAPS: 100.0000 mg | ORAL_CAPSULE | Freq: Two times a day (BID) | ORAL | 0 refills | Status: DC

## 2021-11-05 MED ORDER — METRONIDAZOLE 500 MG PO TABS
2000.0000 mg | ORAL_TABLET | Freq: Once | ORAL | Status: AC
  Administered 2021-11-05: 2000 mg via ORAL
  Filled 2021-11-05: qty 4

## 2021-11-05 NOTE — ED Triage Notes (Signed)
Pt reports with vaginal and anal rash x 2 weeks. Pt could not afford her prescribed medications and bought her own anti itch cream. Pt has redness to her right abdomen.

## 2021-11-05 NOTE — ED Provider Notes (Signed)
[facility] DEPT  Provider Note  CSN: [id number] Arrival date & time: 11/04/21 2350  History Chief Complaint  Patient presents with   Julie [name] is a 46 y.o. female presents for re-evaluation of groin rash. Seen 5/13 for same. Diagnosed with folliculitis and trichomonas. Given Rx for Keflex, Flagyl as well as doxycycline for possible PID. GC/CT came back negative. She was unable to afford to get her medications filled despite being on Medicaid. She also has some irritation of the skin under her R breast. No fevers.    Home Medications Prior to Admission medications   Medication Sig Start Date End Date Taking? Authorizing Provider  doxycycline (VIBRAMYCIN) 100 MG capsule Take 1 capsule (100 mg total) by mouth 2 (two) times daily. 11/05/21  Yes Truddie Hidden, MD  acetaminophen (TYLENOL) 500 MG tablet Take 1,000 mg by mouth every 6 (six) hours as needed for mild pain.    [provider]  bisacodyl (DULCOLAX) 5 MG EC tablet Take 1 tablet (5 mg total) by mouth 2 (two) times daily. 10/31/21   Tedd Sias, PA  metFORMIN (GLUCOPHAGE) 1000 MG tablet Take 1 tablet (1,000 mg total) by mouth 2 (two) times daily. Patient not taking: Reported on 10/31/2021 09/25/21   Luna Fuse, MD     Allergies    Patient has no known allergies.   Review of Systems   Review of Systems Please see HPI for pertinent positives and negatives  Physical Exam BP 136/76 (BP Location: Left Arm)   Pulse (!) 105   Temp 98.4 F (36.9 C) (Oral)   Resp 18   SpO2 94%   Physical Exam Vitals and nursing note reviewed.  Constitutional:      Appearance: Normal appearance.  HENT:     Head: Normocephalic and atraumatic.     Nose: Nose normal.     Mouth/Throat:     Mouth: Mucous membranes are moist.  Eyes:     Extraocular Movements: Extraocular movements intact.     Conjunctiva/sclera: Conjunctivae normal.  Cardiovascular:     Rate and Rhythm: Normal  rate.  Pulmonary:     Effort: Pulmonary effort is normal.     Breath sounds: Normal breath sounds.     Comments: Chaperone present, mild skin irritation under R breast, does not appear to be infected or fungal.  Abdominal:     General: Abdomen is flat.     Palpations: Abdomen is soft.     Tenderness: There is no abdominal tenderness.  Genitourinary:    Comments: Chaperone present. Several small tender nodules in pubic hair consistent with folliculitis.  Musculoskeletal:        General: No swelling. Normal range of motion.     Cervical back: Neck supple.  Skin:    General: Skin is warm and dry.  Neurological:     General: No focal deficit present.     Mental Status: She is alert.  Psychiatric:        Mood and Affect: Mood normal.    ED Results / Procedures / Treatments   EKG None  Procedures Procedures  Medications Ordered in the ED Medications  metroNIDAZOLE (FLAGYL) tablet 2,000 mg (has no administration in time range)    Initial Impression and Plan  Patient with recently diagnosed trichomonas and folliculitis unable to get Rx filled. Will treat trichomonas with one time dose of Flagyl '2000mg'$  in the ED. Re-prescribe doxycycline to cover her folliculitis. Recommend follow  up at PCP for further management.   ED Course       MDM Rules/Calculators/A&P Medical Decision Making Problems Addressed: Folliculitis: chronic illness or injury Intertrigo: chronic illness or injury Trichomonas infection: acute illness or injury  Amount and/or Complexity of Data Reviewed External Data Reviewed: labs.    Details: from previous visit    Final Clinical Impression(s) / ED Diagnoses Final diagnoses:  Trichomonas infection  Folliculitis  Intertrigo    Rx / DC Orders ED Discharge Orders          Ordered    doxycycline (VIBRAMYCIN) 100 MG capsule  2 times daily        11/05/21 0344             Truddie Hidden, MD 11/05/21 [phone]

## 2022-01-20 ENCOUNTER — Emergency Department (HOSPITAL_COMMUNITY)
Admission: EM | Admit: 2022-01-20 | Discharge: 2022-01-20 | Disposition: A | Discharge status: Home / Self Care | Payer: Medicaid Other | Attending: Emergency Medicine | Admitting: Emergency Medicine

## 2022-01-20 ENCOUNTER — Other Ambulatory Visit: Payer: Self-pay

## 2022-01-20 DIAGNOSIS — J45909 Unspecified asthma, uncomplicated: Secondary | ICD-10-CM | POA: Diagnosis not present

## 2022-01-20 DIAGNOSIS — J029 Acute pharyngitis, unspecified: Secondary | ICD-10-CM | POA: Diagnosis not present

## 2022-01-20 LAB — GROUP A STREP BY PCR: Group A Strep by PCR: NOT DETECTED

## 2022-01-20 MED ORDER — ACETAMINOPHEN 325 MG PO TABS
650.0000 mg | ORAL_TABLET | Freq: Once | ORAL | Status: AC
Start: 2022-01-20 — End: 2022-01-20
  Administered 2022-01-20: 650 mg via ORAL
  Filled 2022-01-20: qty 2

## 2022-01-20 MED ORDER — AMOXICILLIN-POT CLAVULANATE 875-125 MG PO TABS: 1.0000 | ORAL_TABLET | Freq: Two times a day (BID) | ORAL | 0 refills | Status: AC

## 2022-01-20 MED ORDER — PENICILLIN V POTASSIUM 500 MG PO TABS: 500.0000 mg | ORAL_TABLET | Freq: Four times a day (QID) | ORAL | 0 refills | Status: DC

## 2022-01-20 NOTE — Discharge Instructions (Signed)
Likely this is a viral infection but it is possible you have a bacterial infection involving your right tonsil,  starting you on antibiotics Augmentin please take as prescribed.  Please follow-up with your PCP/ENT for reassessment.  Come back if your symptoms are worsening, unable to swallow your own saliva, unable to turn her neck, difficulty breathing as you will need further evaluation.

## 2022-01-20 NOTE — ED Provider Notes (Signed)
[name] DEPT Provider Note   CSN: [id number] Arrival date & time: 01/20/22  0031     History  Chief Complaint  Patient presents with   Sore Throat    Julie [name] is a 46 y.o. female.  HPI  Medical history including bronchitis, asthma presents to the emergency room with complaints of sore throat.  Patient states sore throat started today, remains on her left side, states it is slightly difficult to swallow, but is still able to tolerate p.o., she admits to some postnasal drip but denies fevers chills cough stomach pains nausea vomit general body aches.  She denies any systemic rash, no recent medication changes no new environmental changes, she does note that she has had peritonsillar abscess in the past she was concerned she was came in for reassessment.    Home Medications Prior to Admission medications   Medication Sig Start Date End Date Taking? Authorizing Provider  amoxicillin-clavulanate (AUGMENTIN) 875-125 MG tablet Take 1 tablet by mouth every 12 (twelve) hours for 7 days. 01/20/22 01/27/22 Yes Marcello Fennel, PA-C  bisacodyl (DULCOLAX) 5 MG EC tablet Take 1 tablet (5 mg total) by mouth 2 (two) times daily. Patient not taking: Reported on 01/20/2022 10/31/21   Tedd Sias, PA  doxycycline (VIBRAMYCIN) 100 MG capsule Take 1 capsule (100 mg total) by mouth 2 (two) times daily. Patient not taking: Reported on 01/20/2022 11/05/21   Truddie Hidden, MD  metFORMIN (GLUCOPHAGE) 1000 MG tablet Take 1 tablet (1,000 mg total) by mouth 2 (two) times daily. Patient not taking: Reported on 10/31/2021 09/25/21   Luna Fuse, MD      Allergies    Patient has no known allergies.    Review of Systems   Review of Systems  Constitutional:  Negative for chills and fever.  HENT:  Positive for postnasal drip and sore throat. Negative for trouble swallowing and voice change.   Respiratory:  Negative for shortness of breath.   Cardiovascular:   Negative for chest pain.  Gastrointestinal:  Negative for abdominal pain.  Neurological:  Negative for headaches.    Physical Exam Updated Vital Signs BP (!) 146/85 (BP Location: Left Arm)   Pulse 97   Temp 98.5 F (36.9 C) (Oral)   Resp 18   Ht '5\' 10"'$  (1.778 m)   Wt 118.8 kg   SpO2 98%   BMI 37.59 kg/m  Physical Exam Vitals and nursing note reviewed.  Constitutional:      General: She is not in acute distress.    Appearance: She is not ill-appearing.  HENT:     Head: Normocephalic and atraumatic.     Nose: No congestion.     Mouth/Throat:     Mouth: Mucous membranes are moist.     Pharynx: Oropharynx is clear. Posterior oropharyngeal erythema present. No oropharyngeal exudate.     Comments: No trismus no torticollis, no oral edema present, tongue and uvula midline controlling oral secretions, right tonsil slightly larger and slightly erythematous in comparison to the left, no submandibular swelling patient was not drooling and there is no muffled voice present during my exam. Eyes:     Conjunctiva/sclera: Conjunctivae normal.  Cardiovascular:     Rate and Rhythm: Normal rate and regular rhythm.     Pulses: Normal pulses.  Pulmonary:     Effort: Pulmonary effort is normal.  Skin:    General: Skin is warm and dry.     Comments: No evidence of hives on  patient's upper or lower extremities back or abdomen.  Neurological:     Mental Status: She is alert.  Psychiatric:        Mood and Affect: Mood normal.     ED Results / Procedures / Treatments   Labs (all labs ordered are listed, but only abnormal results are displayed) Labs Reviewed  GROUP A STREP BY PCR    EKG None  Radiology No results found.  Procedures Procedures    Medications Ordered in ED Medications  acetaminophen (TYLENOL) tablet 650 mg (has no administration in time range)    ED Course/ Medical Decision Making/ A&P                           Medical Decision Making Risk OTC  drugs. Prescription drug management.   This patient presents to the ED for concern of sore throat, this involves an extensive number of treatment options, and is a complaint that carries with it a high risk of complications and morbidity.  The differential diagnosis includes retropharyngeal/peritonsillar abscess, Ludwick's angina, angioedema    Additional history obtained:  Additional history obtained from N/A External records from outside source obtained and reviewed including previous ED notes   Co morbidities that complicate the patient evaluation  N/A  Social Determinants of Health:  N/A    Lab Tests:  I Ordered, and personally interpreted labs.  The pertinent results include: Strep test negative   Imaging Studies ordered:  I ordered imaging studies including N/A I independently visualized and interpreted imaging which showed N/A I agree with the radiologist interpretation   Cardiac Monitoring:  The patient was maintained on a cardiac monitor.  I personally viewed and interpreted the cardiac monitored which showed an underlying rhythm of: N/A   Medicines ordered and prescription drug management:  I ordered medication including N/A I have reviewed the patients home medicines and have made adjustments as needed  Critical Interventions:  N/A   Reevaluation:  Presents with a sore throat, benign physical exam, agreement with plan discharge at this time.  Consultations Obtained:  N/A    Test Considered:  N/A    Rule out I have low suspicion for retropharyngeal abscess, or Ludwig angina as oropharynx was visualized tongue and uvula were both midline, there is no exudates, no muffled voice, controlling oral secretions.  patient for strep and strep test is negative.  I have low suspicion for angioedema/anaphylaxis presentation atypical, there is no oral edema, no systemic rash no GI symptoms vital signs are reassuring.    Dispostion and problem  list  After consideration of the diagnostic results and the patients response to treatment, I feel that the patent would benefit from discharge.  Pharyngitis-likely this is viral but the right tonsil is slightly larger than the left with slight erythema, possible this could be the beginning of a small infection within that tonsil, will start on antibiotics, follow-up with ENT/PCP for reassessment and strict return precautions.            Final Clinical Impression(s) / ED Diagnoses Final diagnoses:  Pharyngitis, unspecified etiology    Rx / DC Orders ED Discharge Orders          Ordered    penicillin v potassium (VEETID) 500 MG tablet  4 times daily,   Status:  Discontinued        01/20/22 0212    amoxicillin-clavulanate (AUGMENTIN) 875-125 MG tablet  Every 12 hours  01/20/22 0214              Marcello Fennel, PA-C 33/61/22 [id number]    Delora Fuel, MD 53/00/51 [phone]

## 2022-01-20 NOTE — ED Triage Notes (Signed)
Patient coming to ED for evaluation of sore throat.  Reports pain started this morning.  Painful to swollen.  Unknown if she had a fever.

## 2022-01-30 ENCOUNTER — Other Ambulatory Visit: Payer: Self-pay

## 2022-01-30 ENCOUNTER — Emergency Department (HOSPITAL_COMMUNITY): Payer: Medicaid Other

## 2022-01-30 ENCOUNTER — Emergency Department (HOSPITAL_COMMUNITY)
Admission: EM | Admit: 2022-01-30 | Discharge: 2022-01-30 | Disposition: A | Discharge status: Home / Self Care | Payer: Medicaid Other | Attending: Emergency Medicine | Admitting: Emergency Medicine

## 2022-01-30 ENCOUNTER — Encounter (HOSPITAL_COMMUNITY): Payer: Self-pay

## 2022-01-30 DIAGNOSIS — D259 Leiomyoma of uterus, unspecified: Secondary | ICD-10-CM | POA: Diagnosis not present

## 2022-01-30 DIAGNOSIS — J45909 Unspecified asthma, uncomplicated: Secondary | ICD-10-CM | POA: Diagnosis not present

## 2022-01-30 DIAGNOSIS — A5901 Trichomonal vulvovaginitis: Secondary | ICD-10-CM | POA: Diagnosis not present

## 2022-01-30 DIAGNOSIS — N738 Other specified female pelvic inflammatory diseases: Secondary | ICD-10-CM | POA: Insufficient documentation

## 2022-01-30 DIAGNOSIS — R739 Hyperglycemia, unspecified: Secondary | ICD-10-CM | POA: Diagnosis not present

## 2022-01-30 DIAGNOSIS — X58XXXA Exposure to other specified factors, initial encounter: Secondary | ICD-10-CM | POA: Insufficient documentation

## 2022-01-30 DIAGNOSIS — S0501XA Injury of conjunctiva and corneal abrasion without foreign body, right eye, initial encounter: Secondary | ICD-10-CM

## 2022-01-30 DIAGNOSIS — H5711 Ocular pain, right eye: Secondary | ICD-10-CM | POA: Diagnosis not present

## 2022-01-30 DIAGNOSIS — N739 Female pelvic inflammatory disease, unspecified: Secondary | ICD-10-CM

## 2022-01-30 DIAGNOSIS — D219 Benign neoplasm of connective and other soft tissue, unspecified: Secondary | ICD-10-CM

## 2022-01-30 LAB — CBC WITH DIFFERENTIAL/PLATELET
Abs Immature Granulocytes: 0.02 10*3/uL (ref 0.00–0.07)
Basophils Absolute: 0.1 10*3/uL (ref 0.0–0.1)
Basophils Relative: 1 %
Eosinophils Absolute: 0.1 10*3/uL (ref 0.0–0.5)
Eosinophils Relative: 1 %
HCT: 40.8 % (ref 36.0–46.0)
Hemoglobin: 12.7 g/dL (ref 12.0–15.0)
Immature Granulocytes: 0 %
Lymphocytes Relative: 37 %
Lymphs Abs: 3.3 10*3/uL (ref 0.7–4.0)
MCH: 26.6 pg (ref 26.0–34.0)
MCHC: 31.1 g/dL (ref 30.0–36.0)
MCV: 85.4 fL (ref 80.0–100.0)
Monocytes Absolute: 0.7 10*3/uL (ref 0.1–1.0)
Monocytes Relative: 8 %
Neutro Abs: 4.7 10*3/uL (ref 1.7–7.7)
Neutrophils Relative %: 53 %
Platelets: 262 10*3/uL (ref 150–400)
RBC: 4.78 MIL/uL (ref 3.87–5.11)
RDW: 13.4 % (ref 11.5–15.5)
WBC: 8.9 10*3/uL (ref 4.0–10.5)
nRBC: 0 % (ref 0.0–0.2)

## 2022-01-30 LAB — COMPREHENSIVE METABOLIC PANEL
ALT: 17 U/L (ref 0–44)
AST: 16 U/L (ref 15–41)
Albumin: 3.5 g/dL (ref 3.5–5.0)
Alkaline Phosphatase: 58 U/L (ref 38–126)
Anion gap: 7 (ref 5–15)
BUN: 7 mg/dL (ref 6–20)
CO2: 25 mmol/L (ref 22–32)
Calcium: 9.9 mg/dL (ref 8.9–10.3)
Chloride: 108 mmol/L (ref 98–111)
Creatinine, Ser: 0.63 mg/dL (ref 0.44–1.00)
GFR, Estimated: >60 mL/min (ref >60)
Glucose, Bld: 296 mg/dL — ABNORMAL HIGH (ref 70–99)
Potassium: 3.5 mmol/L (ref 3.5–5.1)
Sodium: 140 mmol/L (ref 135–145)
Total Bilirubin: 0.4 mg/dL (ref 0.3–1.2)
Total Protein: 7.1 g/dL (ref 6.5–8.1)

## 2022-01-30 LAB — I-STAT BETA HCG BLOOD, ED (MC, WL, AP ONLY): I-stat hCG, quantitative: <5 m[IU]/mL (ref <5)

## 2022-01-30 LAB — WET PREP, GENITAL
Clue Cells Wet Prep HPF POC: NONE SEEN
Sperm: NONE SEEN
WBC, Wet Prep HPF POC: <10 (ref <10)
Yeast Wet Prep HPF POC: NONE SEEN

## 2022-01-30 LAB — RPR: RPR Ser Ql: NONREACTIVE

## 2022-01-30 LAB — HIV ANTIBODY (ROUTINE TESTING W REFLEX): HIV Screen 4th Generation wRfx: NONREACTIVE

## 2022-01-30 MED ORDER — VALACYCLOVIR HCL 500 MG PO TABS
500.0000 mg | ORAL_TABLET | Freq: Two times a day (BID) | ORAL | 0 refills | Status: DC
Start: 2022-01-30 — End: 2023-02-28

## 2022-01-30 MED ORDER — TETRACAINE HCL 0.5 % OP SOLN
2.0000 [drp] | Freq: Once | OPHTHALMIC | Status: AC
  Administered 2022-01-30: 2 [drp] via OPHTHALMIC
  Filled 2022-01-30: qty 4

## 2022-01-30 MED ORDER — CEFTRIAXONE SODIUM 1 G IJ SOLR
500.0000 mg | Freq: Once | INTRAMUSCULAR | Status: AC
  Administered 2022-01-30: 500 mg via INTRAMUSCULAR
  Filled 2022-01-30: qty 10

## 2022-01-30 MED ORDER — LIDOCAINE HCL 1 % IJ SOLN
INTRAMUSCULAR | Status: AC
  Administered 2022-01-30: 20 mL
  Filled 2022-01-30: qty 20

## 2022-01-30 MED ORDER — METRONIDAZOLE 500 MG PO TABS: 500.0000 mg | ORAL_TABLET | Freq: Two times a day (BID) | ORAL | 0 refills | Status: DC

## 2022-01-30 MED ORDER — FLUORESCEIN SODIUM 1 MG OP STRP
1.0000 | ORAL_STRIP | Freq: Once | OPHTHALMIC | Status: AC
  Administered 2022-01-30: 1 via OPHTHALMIC
  Filled 2022-01-30: qty 1

## 2022-01-30 MED ORDER — ERYTHROMYCIN 5 MG/GM OP OINT
TOPICAL_OINTMENT | Freq: Once | OPHTHALMIC | Status: AC
  Administered 2022-01-30: 1 via OPHTHALMIC
  Filled 2022-01-30: qty 3.5

## 2022-01-30 MED ORDER — DOXYCYCLINE HYCLATE 100 MG PO CAPS: 100.0000 mg | ORAL_CAPSULE | Freq: Two times a day (BID) | ORAL | 0 refills | Status: DC

## 2022-01-30 NOTE — Discharge Instructions (Addendum)
You were seen in the emergency department today for vaginal irritation as well as right eye pain.  Your wet prep showed findings of trichomonas which is a sexually transmitted infection.  Given your pelvic pain with this we are treating you for pelvic inflammatory disease, please see the attached handout.  We have given you a shot of Rocephin which is an antibiotic in the ER.  We are sending you home with doxycycline as well as metronidazole which are additional antibiotics, you will need to take these twice per day for the next 2 weeks.  Do not have sex of any kind until 1 week after your last dose of antibiotics.  Please be sure to inform your sexual partners of the positive trichomonas test that they can be evaluated as well.  Do not drink alcohol while taking Flagyl as it can be very dangerous.  We are covering you for herpes flare with Valtrex, please take this as prescribed.  Your pelvic ultrasound showed findings of fibroids which could be contributing to your pain.  Please take ibuprofen per over-the-counter dosing to help with this.  Please follow-up with OB/GYN within 1 week for reevaluation.  Your right eyes showed findings of a corneal abrasion.  Please use erythromycin ophthalmic ointment to your right eye 4 times per day for the next 5 days.  Please follow-up with an ophthalmologist (eye specialist) for recheck of this area next week.  We have provided information for ophthalmology office on-call. Your blood sugar was elevated in the ER, have this rechecked by your primary care provider.  Return to the emergency department for new or worsening symptoms including but not limited to new or worsening pain, change in your vision, eye drainage, redness/swelling around your eye, inability to keep fluids down, or any other concerns.

## 2022-01-30 NOTE — ED Notes (Signed)
VISUAL ACUITY:  RIGHT EYE - 20/40 LEFT EYE - 20/30 BILAT  - 20/30

## 2022-01-30 NOTE — ED Provider Notes (Signed)
[name] DEPT Provider Note   CSN: [id number] Arrival date & time: 01/30/22  0118     History  Chief Complaint  Patient presents with   Vaginal Itching   Vaginal Discharge   Eye Pain    Julie [name] is a 46 y.o. female with a hx of cocaine abuse, asthma, and prior trichomoniasis infection who presents to the ED with complaints of vaginal irritation x 3-4 days and right eye pain that started this morning.  Patient reports 3 to 4 days of vaginal discharge as well as a flare of skin irritation.  She reports intermittent skin irritation/lesions which he has been told is herpes in the past.  Having some associated right-sided pelvic pain intermittently.No alleviating or aggravating factors.  Denies fever, chills, nausea, vomiting, or vaginal bleeding.  Right eye pain started this morning, it is to the outer aspect of the right eye, worse if she looks to the right, no alleviating factors, no intervention prior to arrival.  She woke up with the pain.  Does not recall specific injury.  She is not a contact lens wear.  She reports that she has blurry vision at baseline, she denies change in her vision, double vision, drainage, or redness to the eye.  HPI     Home Medications Prior to Admission medications   Medication Sig Start Date End Date Taking? Authorizing Provider  bisacodyl (DULCOLAX) 5 MG EC tablet Take 1 tablet (5 mg total) by mouth 2 (two) times daily. Patient not taking: Reported on 01/20/2022 10/31/21   Tedd Sias, PA  doxycycline (VIBRAMYCIN) 100 MG capsule Take 1 capsule (100 mg total) by mouth 2 (two) times daily. Patient not taking: Reported on 01/20/2022 11/05/21   Truddie Hidden, MD  metFORMIN (GLUCOPHAGE) 1000 MG tablet Take 1 tablet (1,000 mg total) by mouth 2 (two) times daily. Patient not taking: Reported on 10/31/2021 09/25/21   Luna Fuse, MD      Allergies    Patient has no known allergies.    Review of Systems    Review of Systems  Constitutional:  Negative for chills and fever.  Eyes:  Positive for pain. Negative for photophobia, discharge, redness, itching and visual disturbance.  Respiratory:  Negative for shortness of breath.   Cardiovascular:  Negative for chest pain.  Gastrointestinal:  Negative for diarrhea and vomiting.  Genitourinary:  Positive for pelvic pain, vaginal discharge and vaginal pain.  All other systems reviewed and are negative.   Physical Exam Updated Vital Signs BP (!) 145/88 (BP Location: Right Arm)   Pulse (!) 110   Temp 98.8 F (37.1 C) (Oral)   Resp 18   SpO2 96%  Physical Exam Vitals and nursing note reviewed. Exam conducted with a chaperone present.  Constitutional:      General: She is not in acute distress.    Appearance: She is well-developed. She is not toxic-appearing.  HENT:     Head: Normocephalic and atraumatic.  Eyes:     General: Lids are everted, no foreign bodies appreciated.        Right eye: No discharge.        Left eye: No discharge.     Intraocular pressure: Right eye pressure is 18 mmHg.     Extraocular Movements: Extraocular movements intact.     Conjunctiva/sclera: Conjunctivae normal.     Pupils: Pupils are equal, round, and reactive to light.     Comments: Fluorescein stain with few millimeters of uptake  lateral to the pupil of the right eye.  Consistent with corneal abrasion.  No dendritic staining or ulceration noted. RIGHT EYE - 20/40 LEFT EYE - 20/30 BILAT  - 20/30  Cardiovascular:     Rate and Rhythm: Normal rate and regular rhythm.  Pulmonary:     Effort: No respiratory distress.     Breath sounds: Normal breath sounds. No wheezing or rales.  Abdominal:     General: There is no distension.     Palpations: Abdomen is soft.     Tenderness: There is abdominal tenderness (right suprapubic). There is no guarding or rebound. Negative signs include Murphy's sign and McBurney's sign.  Genitourinary:    Comments: There are a few  scattered areas of erythema to the labia. White discharge present Right adnexal tenderness with bimanual exam. Musculoskeletal:     Cervical back: Neck supple.  Skin:    General: Skin is warm and dry.  Neurological:     Mental Status: She is alert.     Comments: Clear speech.   Psychiatric:        Behavior: Behavior normal.    ED Results / Procedures / Treatments   Labs (all labs ordered are listed, but only abnormal results are displayed) Labs Reviewed  WET PREP, GENITAL - Abnormal; Notable for the following components:      Result Value   Trich, Wet Prep PRESENT (*)    All other components within normal limits  COMPREHENSIVE METABOLIC PANEL - Abnormal; Notable for the following components:   Glucose, Bld 296 (*)    All other components within normal limits  CBC WITH DIFFERENTIAL/PLATELET  RPR  HIV ANTIBODY (ROUTINE TESTING W REFLEX)  URINALYSIS, ROUTINE W REFLEX MICROSCOPIC  I-STAT BETA HCG BLOOD, ED ([facility], [facility], [facility] ONLY)  GC/CHLAMYDIA PROBE AMP ([facility]) NOT AT [facility]    EKG None  Radiology US Pelvis Complete  Result Date: 01/30/2022 CLINICAL DATA:  Initial evaluation for right-sided pelvic pain for several months. EXAM: TRANSABDOMINAL AND TRANSVAGINAL ULTRASOUND OF PELVIS DOPPLER ULTRASOUND OF OVARIES TECHNIQUE: Both transabdominal and transvaginal ultrasound examinations of the pelvis were performed. Transabdominal technique was performed for global imaging of the pelvis including uterus, ovaries, adnexal regions, and pelvic cul-de-sac. It was necessary to proceed with endovaginal exam following the transabdominal exam to visualize the endometrium and ovaries. Color and duplex Doppler ultrasound was utilized to evaluate blood flow to the ovaries. COMPARISON:  CT from 10/31/2021 and ultrasound from 03/21/2019. FINDINGS: Uterus Measurements: 9.5 x 5.1 x 6.7 cm = volume: 167.4 mL. Uterus is anteverted. 1.6 x 1.4 x 2.1 cm intramural to subserosal fibroid present at the mid  posterior uterine fundus. Endometrium Thickness: 6.4 mm.  No focal abnormality visualized. Right ovary Measurements: 3.9 x 2.0 x 3.3 cm = volume: 13.8 mL. Normal appearance/no adnexal mass. Left ovary Not visualized.  No adnexal mass. Pulsed Doppler evaluation of the right ovary demonstrates normal low-resistance arterial and venous waveforms. Other findings No abnormal free fluid. IMPRESSION: 1. 2.1 cm intramural to subserosal fibroid at the posterior uterine fundus. 2. Normal right ovary with no evidence for ovarian torsion. 3. Nonvisualization of the left ovary. No other adnexal mass or free fluid. 4. No other acute abnormality within the pelvis. Electronically Signed   By: Jeannine Boga M.D.   On: 01/30/2022 05:53   US Transvaginal Non-OB  Result Date: 01/30/2022 CLINICAL DATA:  Initial evaluation for right-sided pelvic pain for several months. EXAM: TRANSABDOMINAL AND TRANSVAGINAL ULTRASOUND OF PELVIS DOPPLER ULTRASOUND OF OVARIES  TECHNIQUE: Both transabdominal and transvaginal ultrasound examinations of the pelvis were performed. Transabdominal technique was performed for global imaging of the pelvis including uterus, ovaries, adnexal regions, and pelvic cul-de-sac. It was necessary to proceed with endovaginal exam following the transabdominal exam to visualize the endometrium and ovaries. Color and duplex Doppler ultrasound was utilized to evaluate blood flow to the ovaries. COMPARISON:  CT from 10/31/2021 and ultrasound from 03/21/2019. FINDINGS: Uterus Measurements: 9.5 x 5.1 x 6.7 cm = volume: 167.4 mL. Uterus is anteverted. 1.6 x 1.4 x 2.1 cm intramural to subserosal fibroid present at the mid posterior uterine fundus. Endometrium Thickness: 6.4 mm.  No focal abnormality visualized. Right ovary Measurements: 3.9 x 2.0 x 3.3 cm = volume: 13.8 mL. Normal appearance/no adnexal mass. Left ovary Not visualized.  No adnexal mass. Pulsed Doppler evaluation of the right ovary demonstrates normal  low-resistance arterial and venous waveforms. Other findings No abnormal free fluid. IMPRESSION: 1. 2.1 cm intramural to subserosal fibroid at the posterior uterine fundus. 2. Normal right ovary with no evidence for ovarian torsion. 3. Nonvisualization of the left ovary. No other adnexal mass or free fluid. 4. No other acute abnormality within the pelvis. Electronically Signed   By: Jeannine Boga M.D.   On: 01/30/2022 05:53   [place] Art/Ven Flow Abd Pelv Doppler  Result Date: 01/30/2022 CLINICAL DATA:  Initial evaluation for right-sided pelvic pain for several months. EXAM: TRANSABDOMINAL AND TRANSVAGINAL ULTRASOUND OF PELVIS DOPPLER ULTRASOUND OF OVARIES TECHNIQUE: Both transabdominal and transvaginal ultrasound examinations of the pelvis were performed. Transabdominal technique was performed for global imaging of the pelvis including uterus, ovaries, adnexal regions, and pelvic cul-de-sac. It was necessary to proceed with endovaginal exam following the transabdominal exam to visualize the endometrium and ovaries. Color and duplex Doppler ultrasound was utilized to evaluate blood flow to the ovaries. COMPARISON:  CT from 10/31/2021 and ultrasound from 03/21/2019. FINDINGS: Uterus Measurements: 9.5 x 5.1 x 6.7 cm = volume: 167.4 mL. Uterus is anteverted. 1.6 x 1.4 x 2.1 cm intramural to subserosal fibroid present at the mid posterior uterine fundus. Endometrium Thickness: 6.4 mm.  No focal abnormality visualized. Right ovary Measurements: 3.9 x 2.0 x 3.3 cm = volume: 13.8 mL. Normal appearance/no adnexal mass. Left ovary Not visualized.  No adnexal mass. Pulsed Doppler evaluation of the right ovary demonstrates normal low-resistance arterial and venous waveforms. Other findings No abnormal free fluid. IMPRESSION: 1. 2.1 cm intramural to subserosal fibroid at the posterior uterine fundus. 2. Normal right ovary with no evidence for ovarian torsion. 3. Nonvisualization of the left ovary. No other adnexal mass or  free fluid. 4. No other acute abnormality within the pelvis. Electronically Signed   By: Jeannine Boga M.D.   On: 01/30/2022 05:53    Procedures Procedures    Medications Ordered in ED Medications - No data to display  ED Course/ Medical Decision Making/ A&P                           Medical Decision Making Amount and/or Complexity of Data Reviewed Labs: ordered. Radiology: ordered.  Risk Prescription drug management.   Patient presents to the ED with complaints of right eye pain that started yesterday morning and a few days of vaginal irritation, this involves an extensive number of treatment options, and is a complaint that carries with it a high risk of complications and morbidity. Nontoxic, vitals with mild tachycardia improved on my exam, also noted to have elevated blood  pressure, low suspicion for hypertensive emergency..   Additional history obtained:  External records viewed including: Previously positive for trichomonas.  Lab Tests:  I viewed & interpreted labs including:  BC: Unremarkable CMP: Hyperglycemia without acidosis or anion gap elevation. Pregnancy test: Negative Wet prep: Positive for trichomonas RPR/HIV/GC/chlamydia: Pending  Imaging Studies:  I ordered and viewed the following imaging, agree with radiologist impression:  Pelvic ultrasound: 1. 2.1 cm intramural to subserosal fibroid at the posterior uterine fundus. 2. Normal right ovary with no evidence for ovarian torsion. 3. Nonvisualization of the left ovary. No other adnexal mass or free fluid. 4. No other acute abnormality within the pelvis.  ED Course:  GU complaints: Wet prep positive for trichomonas, GC/chlamydia pending, pelvic ultrasound without TOA, does have some findings of a fibroid.  No findings of torsion.  Given her right adnexal tenderness with being positive for trichomonas we will treat for pelvic inflammatory disease.  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  Discussed no intercourse of any kind until least 1 week following last dose of antibiotics.  Discussed need to inform sexual partners.  OB/GYN follow-up.  Right eye complaints: Exam with findings of corneal abrasion. No findings to suggest ulceration or HSV. Patient is afebrile and without proptosis, entrapment, or consensual photophobia, no periorbital swelling- doubt periorbital or orbital cellulitis. IOP WNL, doubt acute glaucoma. No significant visual acuity deficit.  Not a contact lens wearer.  Treating with erythromycin ophthalmic ointment which was provided in the ED.  Ophthalmology follow-up.  Hyperglycemia: Does not appear to be in DKA/HHS.  PCP follow-up.  I discussed results, treatment plan, need for follow-up, and return precautions with the patient. Provided opportunity for questions, patient confirmed understanding and is in agreement with plan.     Portions of this note were generated with [employer]. Dictation errors may occur despite best attempts at proofreading.         Final Clinical Impression(s) / ED Diagnoses Final diagnoses:  Abrasion of right cornea, initial encounter  Trichomonal vaginitis  Pelvic inflammatory disease  Hyperglycemia  Fibroid    Rx / DC Orders ED Discharge Orders          Ordered    doxycycline (VIBRAMYCIN) 100 MG capsule  2 times daily        01/30/22 0607    metroNIDAZOLE (FLAGYL) 500 MG tablet  2 times daily        01/30/22 0607    valACYclovir (VALTREX) 500 MG tablet  2 times daily        01/30/22 0607              Amaryllis Dyke, PA-C 01/30/22 0611    Shanon Rosser, MD 01/30/22 [phone]

## 2022-01-30 NOTE — ED Triage Notes (Signed)
Pt reports with vaginal itching and discharge. Pt states that she has bumps "down there" and she doesn't know what they are. Pt complains of left eye pain.

## 2022-02-01 LAB — GC/CHLAMYDIA PROBE AMP (~~LOC~~) NOT AT ARMC
Chlamydia: NEGATIVE
Comment: NEGATIVE
Comment: NORMAL
Neisseria Gonorrhea: NEGATIVE

## 2022-03-06 ENCOUNTER — Encounter (HOSPITAL_COMMUNITY): Payer: Self-pay

## 2022-03-06 ENCOUNTER — Emergency Department (HOSPITAL_COMMUNITY)
Admission: EM | Admit: 2022-03-06 | Discharge: 2022-03-06 | Disposition: A | Discharge status: Home / Self Care | Payer: Medicaid Other | Attending: Emergency Medicine | Admitting: Emergency Medicine

## 2022-03-06 ENCOUNTER — Other Ambulatory Visit: Payer: Self-pay

## 2022-03-06 DIAGNOSIS — N3001 Acute cystitis with hematuria: Secondary | ICD-10-CM | POA: Diagnosis not present

## 2022-03-06 DIAGNOSIS — J45909 Unspecified asthma, uncomplicated: Secondary | ICD-10-CM | POA: Diagnosis not present

## 2022-03-06 DIAGNOSIS — R300 Dysuria: Secondary | ICD-10-CM | POA: Diagnosis present

## 2022-03-06 DIAGNOSIS — M545 Low back pain, unspecified: Secondary | ICD-10-CM | POA: Insufficient documentation

## 2022-03-06 LAB — URINALYSIS, ROUTINE W REFLEX MICROSCOPIC
Bilirubin Urine: NEGATIVE
Glucose, UA: >=500 mg/dL — AB
Ketones, ur: 5 mg/dL — AB
Nitrite: NEGATIVE
Protein, ur: 30 mg/dL — AB
Specific Gravity, Urine: 1.018 (ref 1.005–1.030)
WBC, UA: >50 WBC/hpf — ABNORMAL HIGH (ref 0–5)
pH: 5 (ref 5.0–8.0)

## 2022-03-06 MED ORDER — LIDOCAINE 5 % EX PTCH
1.0000 | MEDICATED_PATCH | CUTANEOUS | Status: DC
  Administered 2022-03-06: 1 via TRANSDERMAL
  Filled 2022-03-06: qty 1

## 2022-03-06 MED ORDER — CEPHALEXIN 500 MG PO CAPS: 500.0000 mg | ORAL_CAPSULE | Freq: Two times a day (BID) | ORAL | 0 refills | Status: AC

## 2022-03-06 NOTE — ED Provider Triage Note (Signed)
Emergency Medicine Provider Triage Evaluation Note  Julie [name] , a 47 y.o. female  was evaluated in triage.  Pt complains of brought in by EMS seen here recently and STD +, never picked up her antibiotics. C/O dysuria and right lower back pain, low back pain x 1 year without fall or injury, denies abdominal pain, groin numbness, loss of bowel or bladder control. Seen here 01/30/22, trich +, otherwise STD negative. Review of Systems  Positive: As above Negative: As above  Physical Exam  SpO2 96%  Gen:   Awake, no distress   Resp:  Normal effort  MSK:   Moves extremities without difficulty  Other:  TTP right lower back  Medical Decision Making  Medically screening exam initiated at 7:23 AM.  Appropriate orders placed.  Julie [name] was informed that the remainder of the evaluation will be completed by another provider, this initial triage assessment does not replace that evaluation, and the importance of remaining in the ED until their evaluation is complete.     Tacy Learn, PA-C 03/06/22 [phone]

## 2022-03-06 NOTE — Discharge Instructions (Signed)
Take antibiotics as prescribed and complete the full course. Recommend Motrin and Tylenol as needed as directed for back pain. Follow-up with a primary care provider.  If you do not have one, please contact the referral provided.

## 2022-03-06 NOTE — ED Notes (Signed)
Patient Alert and oriented to baseline. Stable and ambulatory to baseline. Patient verbalized understanding of the discharge instructions.  Patient belongings were taken by the patient.   

## 2022-03-06 NOTE — ED Provider Notes (Signed)
[facility] DEPT Provider Note   CSN: [id number] Arrival date & time: 03/06/22  0707     History  Chief Complaint  Patient presents with   Back Pain   Dysuria   Hyperglycemia    Julie [name] is a 46 y.o. female.  Pt complains of brought in by EMS seen here recently and STD +, never picked up her antibiotics. C/O dysuria and right lower back pain, low back pain x 1 year without fall or injury, denies abdominal pain, groin numbness, loss of bowel or bladder control. Seen here 01/30/22, trich +, otherwise STD negative.       Home Medications Prior to Admission medications   Medication Sig Start Date End Date Taking? Authorizing Provider  cephALEXin (KEFLEX) 500 MG capsule Take 1 capsule (500 mg total) by mouth 2 (two) times daily for 5 days. 03/06/22 03/11/22 Yes Tacy Learn, PA-C  doxycycline (VIBRAMYCIN) 100 MG capsule Take 1 capsule (100 mg total) by mouth 2 (two) times daily. 01/30/22   Petrucelli, Samantha R, PA-C  metroNIDAZOLE (FLAGYL) 500 MG tablet Take 1 tablet (500 mg total) by mouth 2 (two) times daily. 01/30/22   Petrucelli, Glynda Jaeger, PA-C  valACYclovir (VALTREX) 500 MG tablet Take 1 tablet (500 mg total) by mouth 2 (two) times daily. 01/30/22   Petrucelli, Glynda Jaeger, PA-C      Allergies    Patient has no known allergies.    Review of Systems   Review of Systems Negative except as per HPI Physical Exam Updated Vital Signs BP 133/89 (BP Location: Left Arm)   Pulse (!) 116   Temp 98.6 F (37 C) (Oral)   Resp 18   Ht '5\' 11"'$  (1.803 m)   Wt 121.6 kg   LMP  (LMP Unknown)   SpO2 95%   BMI 37.38 kg/m  Physical Exam Vitals and nursing note reviewed.  Constitutional:      General: She is not in acute distress.    Appearance: She is well-developed. She is not diaphoretic.  HENT:     Head: Normocephalic and atraumatic.  Pulmonary:     Effort: Pulmonary effort is normal.  Abdominal:     Palpations: Abdomen is soft.      Tenderness: There is no abdominal tenderness. There is no right CVA tenderness or left CVA tenderness.  Musculoskeletal:        General: Tenderness present. No swelling or deformity.     Cervical back: No tenderness or bony tenderness.     Thoracic back: No tenderness or bony tenderness.     Lumbar back: Tenderness present. No bony tenderness.       Back:  Skin:    General: Skin is warm and dry.     Findings: No erythema or rash.  Neurological:     Mental Status: She is alert and oriented to person, place, and time.     Sensory: No sensory deficit.     Motor: No weakness.     Gait: Gait normal.  Psychiatric:        Behavior: Behavior normal.     ED Results / Procedures / Treatments   Labs (all labs ordered are listed, but only abnormal results are displayed) Labs Reviewed  URINALYSIS, ROUTINE W REFLEX MICROSCOPIC - Abnormal; Notable for the following components:      Result Value   APPearance CLOUDY (*)    Glucose, UA >=500 (*)    Hgb urine dipstick MODERATE (*)    Ketones,  ur 5 (*)    Protein, ur 30 (*)    Leukocytes,Ua MODERATE (*)    WBC, UA >50 (*)    Bacteria, UA MANY (*)    All other components within normal limits    EKG None  Radiology No results found.  Procedures Procedures    Medications Ordered in ED Medications  lidocaine (LIDODERM) 5 % 1 patch (1 patch Transdermal Patch Applied 03/06/22 0747)    ED Course/ Medical Decision Making/ A&P                           Medical Decision Making Amount and/or Complexity of Data Reviewed Labs: ordered.  Risk Prescription drug management.   46 year old female with past medical history of asthma, bronchitis, suspect diabetes due to elevated glucose readings on prior lab visits for the past 3 years.  Presents with complaint of bladder pressure and discomfort with acute on chronic right lower back pain without report of falls or injuries.  Denies saddle paresthesia, abdominal pain, loss of bowel or bladder  control.  Found to have tenderness palpation right lower back, no midline or bony tenderness, no CVA tenderness, abdomen soft and nontender.  Urinalysis suggest urinary tract infection, will treat with Keflex.  Provided with Lidoderm patch for her back pain which has improved her back pain.  Provided with referral to local clinic for follow-up.        Final Clinical Impression(s) / ED Diagnoses Final diagnoses:  Acute cystitis with hematuria  Acute right-sided low back pain without sciatica    Rx / DC Orders ED Discharge Orders          Ordered    cephALEXin (KEFLEX) 500 MG capsule  2 times daily        03/06/22 0841              Tacy Learn, PA-C 03/06/22 3086    Isla Pence, MD 03/06/22 1431

## 2022-03-06 NOTE — ED Triage Notes (Signed)
Per EMS- Patient was seen recently for STD's and never got the antibiotics filled.  Today, the patient c/o right lower back pain, dysuria. Patient's CBG with EMS-[id number].  Patient also reports that she has not had insulin in a while.

## 2022-03-18 DIAGNOSIS — N9489 Other specified conditions associated with female genital organs and menstrual cycle: Secondary | ICD-10-CM | POA: Diagnosis not present

## 2022-03-18 DIAGNOSIS — F32A Depression, unspecified: Secondary | ICD-10-CM | POA: Diagnosis not present

## 2022-03-18 DIAGNOSIS — R7309 Other abnormal glucose: Secondary | ICD-10-CM | POA: Diagnosis not present

## 2022-03-18 DIAGNOSIS — R45851 Suicidal ideations: Secondary | ICD-10-CM | POA: Diagnosis not present

## 2022-03-18 DIAGNOSIS — F1410 Cocaine abuse, uncomplicated: Secondary | ICD-10-CM | POA: Insufficient documentation

## 2022-03-18 DIAGNOSIS — Z20822 Contact with and (suspected) exposure to covid-19: Secondary | ICD-10-CM | POA: Insufficient documentation

## 2022-03-18 DIAGNOSIS — F1010 Alcohol abuse, uncomplicated: Secondary | ICD-10-CM | POA: Diagnosis not present

## 2022-03-18 DIAGNOSIS — Z046 Encounter for general psychiatric examination, requested by authority: Secondary | ICD-10-CM | POA: Diagnosis present

## 2022-03-18 NOTE — ED Triage Notes (Addendum)
Patient brought in by EMS. States she is suicidal. Patient admits to drug and alcohol use today. Patient endorses plan.  Patient states she is tired "tired of life, tired of drugs". Patient states she has a plan but doesn't want to tell anyone.

## 2022-03-19 ENCOUNTER — Other Ambulatory Visit: Payer: Self-pay

## 2022-03-19 ENCOUNTER — Emergency Department (HOSPITAL_COMMUNITY)
Admission: EM | Admit: 2022-03-19 | Discharge: 2022-03-19 | Disposition: A | Discharge status: Other Acute Inpatient Hospital | Payer: Medicaid Other | Attending: Emergency Medicine | Admitting: Emergency Medicine

## 2022-03-19 ENCOUNTER — Encounter (HOSPITAL_COMMUNITY): Payer: Self-pay

## 2022-03-19 DIAGNOSIS — Z5901 Sheltered homelessness: Secondary | ICD-10-CM | POA: Diagnosis not present

## 2022-03-19 DIAGNOSIS — R45851 Suicidal ideations: Secondary | ICD-10-CM

## 2022-03-19 DIAGNOSIS — F1910 Other psychoactive substance abuse, uncomplicated: Secondary | ICD-10-CM

## 2022-03-19 DIAGNOSIS — F32A Depression, unspecified: Secondary | ICD-10-CM

## 2022-03-19 LAB — SALICYLATE LEVEL: Salicylate Lvl: <7 mg/dL — ABNORMAL LOW (ref 7.0–30.0)

## 2022-03-19 LAB — I-STAT BETA HCG BLOOD, ED (MC, WL, AP ONLY): I-stat hCG, quantitative: <5 m[IU]/mL (ref <5)

## 2022-03-19 LAB — COMPREHENSIVE METABOLIC PANEL
ALT: 19 U/L (ref 0–44)
AST: 18 U/L (ref 15–41)
Albumin: 3.9 g/dL (ref 3.5–5.0)
Alkaline Phosphatase: 61 U/L (ref 38–126)
Anion gap: 5 (ref 5–15)
BUN: 7 mg/dL (ref 6–20)
CO2: 24 mmol/L (ref 22–32)
Calcium: 10.7 mg/dL — ABNORMAL HIGH (ref 8.9–10.3)
Chloride: 109 mmol/L (ref 98–111)
Creatinine, Ser: 0.61 mg/dL (ref 0.44–1.00)
GFR, Estimated: >60 mL/min (ref >60)
Glucose, Bld: 251 mg/dL — ABNORMAL HIGH (ref 70–99)
Potassium: 3.5 mmol/L (ref 3.5–5.1)
Sodium: 138 mmol/L (ref 135–145)
Total Bilirubin: 0.4 mg/dL (ref 0.3–1.2)
Total Protein: 7.7 g/dL (ref 6.5–8.1)

## 2022-03-19 LAB — CBC
HCT: 45.5 % (ref 36.0–46.0)
Hemoglobin: 14.4 g/dL (ref 12.0–15.0)
MCH: 26.2 pg (ref 26.0–34.0)
MCHC: 31.6 g/dL (ref 30.0–36.0)
MCV: 82.9 fL (ref 80.0–100.0)
Platelets: 258 10*3/uL (ref 150–400)
RBC: 5.49 MIL/uL — ABNORMAL HIGH (ref 3.87–5.11)
RDW: 14.8 % (ref 11.5–15.5)
WBC: 9 10*3/uL (ref 4.0–10.5)
nRBC: 0 % (ref 0.0–0.2)

## 2022-03-19 LAB — HEMOGLOBIN A1C
Hgb A1c MFr Bld: 11.8 % — ABNORMAL HIGH (ref 4.8–5.6)
Mean Plasma Glucose: 291.96 mg/dL

## 2022-03-19 LAB — HCG, SERUM, QUALITATIVE: Preg, Serum: NEGATIVE

## 2022-03-19 LAB — RESP PANEL BY RT-PCR (FLU A&B, COVID) ARPGX2
Influenza A by PCR: NEGATIVE
Influenza B by PCR: NEGATIVE
SARS Coronavirus 2 by RT PCR: NEGATIVE

## 2022-03-19 LAB — RAPID URINE DRUG SCREEN, HOSP PERFORMED
Amphetamines: NOT DETECTED
Barbiturates: NOT DETECTED
Benzodiazepines: NOT DETECTED
Cocaine: POSITIVE — AB
Opiates: NOT DETECTED
Tetrahydrocannabinol: NOT DETECTED

## 2022-03-19 LAB — HEPATITIS PANEL, ACUTE
HCV Ab: NONREACTIVE
Hep A IgM: NONREACTIVE
Hep B C IgM: NONREACTIVE
Hepatitis B Surface Ag: NONREACTIVE

## 2022-03-19 LAB — HIV ANTIBODY (ROUTINE TESTING W REFLEX): HIV Screen 4th Generation wRfx: NONREACTIVE

## 2022-03-19 LAB — ETHANOL: Alcohol, Ethyl (B): <10 mg/dL (ref <10)

## 2022-03-19 LAB — FERRITIN: Ferritin: 10 ng/mL — ABNORMAL LOW (ref 11–307)

## 2022-03-19 LAB — CBG MONITORING, ED: Glucose-Capillary: 255 mg/dL — ABNORMAL HIGH (ref 70–99)

## 2022-03-19 LAB — TSH: TSH: 1.323 u[IU]/mL (ref 0.350–4.500)

## 2022-03-19 LAB — SARS CORONAVIRUS 2 BY RT PCR: SARS Coronavirus 2 by RT PCR: NEGATIVE

## 2022-03-19 LAB — ACETAMINOPHEN LEVEL: Acetaminophen (Tylenol), Serum: <10 ug/mL — ABNORMAL LOW (ref 10–30)

## 2022-03-19 MED ORDER — SUCRALFATE 1 GM/10ML PO SUSP
1.0000 g | Freq: Three times a day (TID) | ORAL | Status: AC
  Filled 2022-03-19: qty 10

## 2022-03-19 MED ORDER — IBUPROFEN 200 MG PO TABS
600.0000 mg | ORAL_TABLET | Freq: Four times a day (QID) | ORAL | Status: DC | PRN
  Administered 2022-03-19: 600 mg via ORAL
  Filled 2022-03-19: qty 3

## 2022-03-19 NOTE — BH Assessment (Signed)
Clinician message Andre Lefort, RN: "Hey. It's Trey with TTS. Is the pt able to engage in the assessment, if so the pt will need to be placed in a private room. Is the pt under IVC? Also is the pt medically cleared?"   Clinician awaiting response.    Vertell Novak, MS, [facility], [facility] Triage Specialist [phone].

## 2022-03-19 NOTE — ED Provider Notes (Signed)
Patient has been accepted to [facility] by Dr. Versie Starks.  EMTALA is filled out.   Julie Rasmussen, MD 03/20/22 1137

## 2022-03-19 NOTE — ED Notes (Signed)
Labeled specimen cup provided to pt for U/A collection per MD order. [employer] 

## 2022-03-19 NOTE — ED Notes (Signed)
Patient requested pain medication due for her mouth sores.

## 2022-03-19 NOTE — ED Notes (Signed)
Evette Georges, NP recommends pt to be observed and reassessed by psychiatry.

## 2022-03-19 NOTE — ED Notes (Signed)
Patient discharge to facility per provider. Patient alert, calm, cooperative, no s/s of distress. Discharge information  and belongings given Safe Transport for transport. Patient ambulatory off unit, escorted by NT. Patient transported by [employer].

## 2022-03-19 NOTE — ED Notes (Signed)
Pt personal items collected and placed into one labeled belongings bag. Items include shirt, shoes, pants. Bag placed in triage cabinet. [employer]

## 2022-03-19 NOTE — Progress Notes (Signed)
Pt was accepted to old [facility] 03/19/22; Bed Assignment [facility] [address]  Pt meets inpatient criteria per Garrison Columbus, FNP  Attending Physician will be Dr. Alcide Clever  Report can be called to: [phone]  Pt can arrive after: BED IS [place] Team notified: Eloisa Northern, RN, Merrily Brittle, DO, Marinda Elk, RN, Derrill Kay, RN, Lindon Romp, NP, Garrison Columbus, NP, and Mariea Clonts, [place], [place] 03/19/2022 @ 4:11 PM

## 2022-03-19 NOTE — Consult Note (Signed)
[facility] Psychiatry Consult   Reason for Consult: Psych consult Referring Physician: Dr. Roderic Palau Patient Identification: Julie [name] MRN:  [id number] Principal Diagnosis: <principal problem not specified> Homelessness and depression Diagnosis:  Active Problems:   * No active hospital problems. * Suicidal ideation  Total Time spent with patient: 35 Minutes  Subjective:   Julie [name] is a 46 y.o. female patient.  HPI: As per initial intake note from [facility] ED: Julie [name] is a 46 y.o. female who presents to the emergency department via EMS complaining of suicidal thoughts.  She will not disclose any suicidal plan.  Reports that she is "tired of life and tired of drugs".  She does admit to presently being homeless.  Has had a number of prior behavioral health hospitalizations, but has never been compliant with her psychiatric medications.  Does not presently have any outpatient behavioral follow-up or established provider.  Endorses use of alcohol and cocaine prior to arrival. Came to the ED via EMS with her boyfriend who is also seeking assessment for SI.   The history is provided by the patient. No language interpreter was used.    Assessment: Patient is seen face-to-face and examined lying in bed at [facility] ED.  She appears calm, disheveled, and participating willingly in the exam.  Chart reviewed and findings shared with the treatment team and consult with Dr. Dwyane Dee.  Alert and oriented to person, time, place, and situation.  Speech clear and coherent with normal volume and pattern.  When asked what brought patient to the hospital, reports, "I am tired of life, I do not want to live anymore, I am tired of suffering, and I am tired of drug use."  When asked if she is homeless, patient responds, "I am not homeless but I do not have a stable home, I have moved from house to house."  Patient reports suicide attempt x3 in the past.  She presents with anxious and  depressed mood with affect restricted.  Thought process is coherent and thought content logical and tangential.  Sensorium with memory fair, judgment poor, and insight shallow.  New orders for serum pregnancy test, RPR, HIV, hepatitis panel, hemoglobin A1c, TSH, T3, T4, ferritin level and EKG ordered as per Dr. Alfonse Spruce prior to transfer to [facility].  Patient endorses suicidal ideation however stated that she does not want to tell anyone about her plans.  She denies homicidal ideation or auditory/visual hallucinations.  Reports her sleep was fair, and appetite normal.  Patient denies being followed by a therapist denies family history of mental illness. Patient reports use of crack cocaine of $50-$80 worth daily, she reports drinking beer occasionally, and smoking tobacco 1 pack daily.  Instructions provided on cessation of polysubstance use as they adversely affects overall psychiatric and medical wellbeing.  Patient nodded in agreement.  Patient reports that she has disability from mental health and has been in several behavioral health Hospital in the past however does not remember the names of the hospital.  Disposition: Patient meets the criteria for inpatient psychiatric admission.  [facility] called for possible admission.  Dr. Alfonse Spruce accepts patient at [facility], see new orders to be obtained prior to sending patient to [facility].  [facility] treatment team and [facility] physician made aware of patient disposition.  Past Psychiatric History:   Risk to Self:  Yes Risk to Others:  No Prior Inpatient Therapy:  Yes Prior Outpatient Therapy:  Yes  Past Medical History:  Past Medical History:  Diagnosis Date   Asthma    Fracture, humerus closed, shaft 07/11/2014   left   History of bronchitis    Mental health problem    Open fracture of great toe of left foot 12/25/2015    Past Surgical History:  Procedure Laterality Date   CESAREAN SECTION     ECTOPIC  PREGNANCY SURGERY  2003   INCISION AND DRAINAGE Left 12/25/2015   Procedure: INCISION AND DRAINAGE With  Amputation of distal tip of Left Great and Second Toe.;  Surgeon: Leandrew Koyanagi, MD;  Location: [facility];  Service: Orthopedics;  Laterality: Left;   LIPOMA EXCISION     chest   ORIF HUMERUS FRACTURE Left 07/15/2014   Procedure: OPEN REDUCTION INTERNAL FIXATION (ORIF) LEFT HUMERAL SHAFT ;  Surgeon: Renette Butters, MD;  Location: [facility];  Service: Orthopedics;  Laterality: Left;   TUBAL LIGATION  2003   Family History: History reviewed. No pertinent family history.  Family Psychiatric  History: No Social History:  Social History   Substance and Sexual Activity  Alcohol Use Yes   Comment: occasionally     Social History   Substance and Sexual Activity  Drug Use Yes   Types: Marijuana, "Crack" cocaine, MDMA (Ecstacy)   Comment: Crack, THC, Molly    Social History   Socioeconomic History   Marital status: Single    Spouse name: Not on file   Number of children: Not on file   Years of education: Not on file   Highest education level: Not on file  Occupational History   Not on file  Tobacco Use   Smoking status: Every Day    Years: 18.00    Types: Cigarettes   Smokeless tobacco: Never   Tobacco comments:    6 cig./day  Vaping Use   Vaping Use: Never used  Substance and Sexual Activity   Alcohol use: Yes    Comment: occasionally   Drug use: Yes    Types: Marijuana, "Crack" cocaine, MDMA (Ecstacy)    Comment: Crack, THC, Molly   Sexual activity: Yes    Birth control/protection: I.U.D.  Other Topics Concern   Not on file  Social History Narrative   Not on file   Social Determinants of Health   Financial Resource Strain: Not on file  Food Insecurity: Food Insecurity Present (03/05/2019)   Hunger Vital Sign    Worried About Running Out of Food in the Last Year: Often true    Ran Out of Food in the Last Year: Often true  Transportation Needs:  Unmet Transportation Needs (03/05/2019)   PRAPARE - Hydrologist (Medical): Yes    Lack of Transportation (Non-Medical): Yes  Physical Activity: Not on file  Stress: Not on file  Social Connections: Not on file   Additional Social History:    Allergies:  No Known Allergies  Labs:  Results for orders placed or performed during the hospital encounter of 03/19/22 (from the past 48 hour(s))  CBG monitoring, ED     Status: Abnormal   Collection Time: 03/19/22 12:00 AM  Result Value Ref Range   Glucose-Capillary 255 (H) 70 - 99 mg/dL    Comment: Glucose reference range applies only to samples taken after fasting for at least 8 hours.  Rapid urine drug screen (hospital performed)     Status: Abnormal   Collection Time: 03/19/22 12:02 AM  Result Value Ref Range  Opiates NONE DETECTED NONE DETECTED   Cocaine POSITIVE (A) NONE DETECTED   Benzodiazepines NONE DETECTED NONE DETECTED   Amphetamines NONE DETECTED NONE DETECTED   Tetrahydrocannabinol NONE DETECTED NONE DETECTED   Barbiturates NONE DETECTED NONE DETECTED    Comment: (NOTE) DRUG SCREEN FOR MEDICAL PURPOSES ONLY.  IF CONFIRMATION IS NEEDED FOR ANY PURPOSE, NOTIFY LAB WITHIN 5 DAYS.  LOWEST DETECTABLE LIMITS FOR URINE DRUG SCREEN Drug Class                     Cutoff (ng/mL) Amphetamine and metabolites    1000 Barbiturate and metabolites    200 Benzodiazepine                 481 Tricyclics and metabolites     300 Opiates and metabolites        300 Cocaine and metabolites        300 THC                            50 Performed at [facility], [place] [address] [place], [place] [address]   Comprehensive metabolic panel     Status: Abnormal   Collection Time: 03/19/22 12:15 AM  Result Value Ref Range   Sodium 138 135 - 145 mmol/L   Potassium 3.5 3.5 - 5.1 mmol/L   Chloride 109 98 - 111 mmol/L   CO2 24 22 - 32 mmol/L   Glucose, Bld 251 (H) 70 - 99 mg/dL    Comment: Glucose  reference range applies only to samples taken after fasting for at least 8 hours.   BUN 7 6 - 20 mg/dL   Creatinine, Ser 0.61 0.44 - 1.00 mg/dL   Calcium 10.7 (H) 8.9 - 10.3 mg/dL   Total Protein 7.7 6.5 - 8.1 g/dL   Albumin 3.9 3.5 - 5.0 g/dL   AST 18 15 - 41 U/L   ALT 19 0 - 44 U/L   Alkaline Phosphatase 61 38 - 126 U/L   Total Bilirubin 0.4 0.3 - 1.2 mg/dL   GFR, Estimated >60 >60 mL/min    Comment: (NOTE) Calculated using the CKD-EPI Creatinine Equation (2021)    Anion gap 5 5 - 15    Comment: Performed at [name], [place] [address], [place], [place] [address]  Ethanol     Status: None   Collection Time: 03/19/22 12:15 AM  Result Value Ref Range   Alcohol, Ethyl (B) <10 <10 mg/dL    Comment: (NOTE) Lowest detectable limit for serum alcohol is 10 mg/dL.  For medical purposes only. Performed at [facility], [place] [address] [place], [place] [address]   Salicylate level     Status: Abnormal   Collection Time: 03/19/22 12:15 AM  Result Value Ref Range   Salicylate Lvl <8.8 (L) 7.0 - 30.0 mg/dL    Comment: Performed at [facility], [place] [address], [place], [place] [address]  Acetaminophen level     Status: Abnormal   Collection Time: 03/19/22 12:15 AM  Result Value Ref Range   Acetaminophen (Tylenol), Serum <10 (L) 10 - 30 ug/mL    Comment: (NOTE) Therapeutic concentrations vary significantly. A range of 10-30 ug/mL  may be an effective concentration for many patients. However, some  are best treated at concentrations outside of this range. Acetaminophen concentrations >150 ug/mL at 4 hours after ingestion  and >50 ug/mL at 12 hours after  ingestion are often associated with  toxic reactions.  Performed at [facility], [place] [address] [place], [place] [address]   cbc     Status: Abnormal   Collection Time: 03/19/22 12:15 AM  Result Value Ref Range   WBC 9.0 4.0 - 10.5 K/uL   RBC 5.49 (H)  3.87 - 5.11 MIL/uL   Hemoglobin 14.4 12.0 - 15.0 g/dL   HCT 45.5 36.0 - 46.0 %   MCV 82.9 80.0 - 100.0 fL   MCH 26.2 26.0 - 34.0 pg   MCHC 31.6 30.0 - 36.0 g/dL   RDW 14.8 11.5 - 15.5 %   Platelets 258 150 - 400 K/uL   nRBC 0.0 0.0 - 0.2 %    Comment: Performed at [facility], [place] [address] [place], [place] [address]  SARS Coronavirus 2 by RT PCR (hospital order, performed in [facility] hospital lab) *cepheid single result test* Anterior Nasal Swab     Status: None   Collection Time: 03/19/22 12:15 AM   Specimen: Anterior Nasal Swab  Result Value Ref Range   SARS Coronavirus 2 by RT PCR NEGATIVE NEGATIVE    Comment: (NOTE) SARS-CoV-2 target nucleic acids are NOT DETECTED.  The SARS-CoV-2 RNA is generally detectable in upper and lower respiratory specimens during the acute phase of infection. The lowest concentration of SARS-CoV-2 viral copies this assay can detect is 250 copies / mL. A negative result does not preclude SARS-CoV-2 infection and should not be used as the sole basis for treatment or other patient management decisions.  A negative result may occur with improper specimen collection / handling, submission of specimen other than nasopharyngeal swab, presence of viral mutation(s) within the areas targeted by this assay, and inadequate number of viral copies (<250 copies / mL). A negative result must be combined with clinical observations, patient history, and epidemiological information.  Fact Sheet for Patients:   https://www.patel.info/  Fact Sheet for Healthcare Providers: https://hall.com/  This test is not yet approved or  cleared by the [place] FDA and has been authorized for detection and/or diagnosis of SARS-CoV-2 by FDA under an Emergency Use Authorization (EUA).  This EUA will remain in effect (meaning this test can be used) for the duration of the COVID-19 declaration under Section  564(b)(1) of the Act, 21 U.S.C. section 360bbb-3(b)(1), unless the authorization is terminated or revoked sooner.  Performed at [facility], [place] [address] [place], [place] [address]   I-Stat beta hCG blood, ED     Status: None   Collection Time: 03/19/22 12:30 AM  Result Value Ref Range   I-stat hCG, quantitative <5.0 <5 mIU/mL   Comment 3            Comment:   GEST. AGE      CONC.  (mIU/mL)   <=1 WEEK        5 - 50     2 WEEKS       50 - 500     3 WEEKS       100 - 10,000     4 WEEKS     1,000 - 30,000        FEMALE AND NON-PREGNANT FEMALE:     LESS THAN 5 mIU/mL     Current Facility-Administered Medications  Medication Dose Route Frequency Provider Last Rate Last Admin   ibuprofen (ADVIL) tablet 600 mg  600 mg Oral Q6H PRN Antonietta Breach, PA-C   600 mg at 03/19/22  0352   Current Outpatient Medications  Medication Sig Dispense Refill   acetaminophen (TYLENOL) 500 MG tablet Take 2,000 mg by mouth 2 (two) times daily as needed (pain).     doxycycline (VIBRAMYCIN) 100 MG capsule Take 1 capsule (100 mg total) by mouth 2 (two) times daily. (Patient not taking: Reported on 03/19/2022) 28 capsule 0   metroNIDAZOLE (FLAGYL) 500 MG tablet Take 1 tablet (500 mg total) by mouth 2 (two) times daily. (Patient not taking: Reported on 03/19/2022) 28 tablet 0   valACYclovir (VALTREX) 500 MG tablet Take 1 tablet (500 mg total) by mouth 2 (two) times daily. (Patient not taking: Reported on 03/19/2022) 10 tablet 0    Musculoskeletal: Strength & Muscle Tone: within normal limits Gait & Station: normal Patient leans: N/A  Psychiatric Specialty Exam:  Presentation  General Appearance: Appropriate for Environment; Casual; Disheveled  Eye Contact:Fair  Speech:Clear and Coherent  Speech Volume:Normal  Handedness:Right  Mood and Affect  Mood:Anxious; Depressed  Affect:Restricted  Thought Process  Thought Processes:Coherent; Linear  Descriptions of  Associations:Intact  Orientation:Full (Time, Place and Person)  Thought Content:Logical; Tangential  History of Schizophrenia/Schizoaffective disorder:No data recorded Duration of Psychotic Symptoms:No data recorded Hallucinations:Hallucinations: None  Ideas of Reference:None  Suicidal Thoughts:Suicidal Thoughts: Yes, Passive SI Passive Intent and/or Plan: Without Intent; Without Plan  Homicidal Thoughts:Homicidal Thoughts: No  Sensorium  Memory:Immediate Fair; Recent Fair; Remote Fair  Judgment:Poor  Insight:Shallow  Executive Functions  Concentration:Fair  Attention Span:Fair  [place]  Language:Good  Psychomotor Activity  Psychomotor Activity:Psychomotor Activity: Normal  Assets  Assets:Communication Skills; Desire for Improvement; Physical Health  Sleep  Sleep:Sleep: Good Number of Hours of Sleep: 6  Physical Exam: Physical Exam Vitals and nursing note reviewed.  Constitutional:      Appearance: She is obese.  HENT:     Head: Normocephalic.     Right Ear: External ear normal.     Left Ear: External ear normal.     Nose: Nose normal.     Mouth/Throat:     Pharynx: Oropharynx is clear.  Eyes:     Extraocular Movements: Extraocular movements intact.     Conjunctiva/sclera: Conjunctivae normal.     Pupils: Pupils are equal, round, and reactive to light.  Cardiovascular:     Rate and Rhythm: Normal rate.     Pulses: Normal pulses.  Pulmonary:     Effort: Pulmonary effort is normal.  Abdominal:     Palpations: Abdomen is soft.  Genitourinary:    Comments: Deferred Musculoskeletal:        General: Normal range of motion.     Cervical back: Normal range of motion.  Skin:    General: Skin is warm.  Neurological:     General: No focal deficit present.     Mental Status: She is oriented to person, place, and time.  Psychiatric:        Behavior: Behavior normal.   Review of Systems  Constitutional: Negative.   Negative for chills and fever.  HENT: Negative.  Negative for hearing loss and tinnitus.   Eyes: Negative.  Negative for blurred vision and double vision.  Respiratory: Negative.  Negative for cough, sputum production, shortness of breath and wheezing.   Cardiovascular: Negative.  Negative for chest pain and palpitations.  Gastrointestinal: Negative.  Negative for heartburn.  Genitourinary: Negative.  Negative for dysuria, frequency and urgency.  Musculoskeletal: Negative.  Negative for myalgias and neck pain.  Skin: Negative.  Negative for rash.  Neurological: Negative.  Negative for dizziness, tingling and headaches.  Endo/Heme/Allergies: Negative.  Negative for environmental allergies and polydipsia. Does not bruise/bleed easily.  Psychiatric/Behavioral:  Positive for depression, substance abuse and suicidal ideas.    Blood pressure (!) 153/105, pulse 80, temperature 98.3 F (36.8 C), temperature source Oral, resp. rate 20, height '5\' 10"'$  (1.778 m), weight 113.4 kg, last menstrual period 11/19/2021, SpO2 97 %. Body mass index is 35.87 kg/m.  Treatment Plan Summary: Daily contact with patient to assess and evaluate symptoms and progress in treatment and Medication management  Disposition: Recommend psychiatric Inpatient admission when medically cleared.  Laretta Bolster, FNP 03/19/2022 11:02 AM
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
## 2022-03-19 NOTE — ED Provider Notes (Signed)
[facility] DEPT Provider Note   CSN: [id number] Arrival date & time: 03/18/22  2346     History  Chief Complaint  Patient presents with   Suicidal    Julie [name] is a 46 y.o. female.  45 year old female presents to the emergency department via EMS complaining of suicidal thoughts.  She will not disclose any suicidal plan.  Reports that she is "tired of life and tired of drugs".  She does admit to presently being homeless.  Has had a number of prior behavioral health hospitalizations, but has never been compliant with her psychiatric medications.  Does not presently have any outpatient behavioral follow-up or established provider.  Endorses use of alcohol and cocaine prior to arrival.  Came to the ED via EMS with her boyfriend who is also seeking assessment for SI.  The history is provided by the patient. No language interpreter was used.       Home Medications Prior to Admission medications   Medication Sig Start Date End Date Taking? Authorizing Provider  doxycycline (VIBRAMYCIN) 100 MG capsule Take 1 capsule (100 mg total) by mouth 2 (two) times daily. 01/30/22   Petrucelli, Samantha R, PA-C  metroNIDAZOLE (FLAGYL) 500 MG tablet Take 1 tablet (500 mg total) by mouth 2 (two) times daily. 01/30/22   Petrucelli, Glynda Jaeger, PA-C  valACYclovir (VALTREX) 500 MG tablet Take 1 tablet (500 mg total) by mouth 2 (two) times daily. 01/30/22   Petrucelli, Glynda Jaeger, PA-C      Allergies    Patient has no known allergies.    Review of Systems   Review of Systems Ten systems reviewed and are negative for acute change, except as noted in the HPI.    Physical Exam Updated Vital Signs BP (!) 146/92 (BP Location: Left Arm)   Pulse (!) 103   Temp 98.4 F (36.9 C) (Oral)   Resp 20   Ht '5\' 10"'$  (1.778 m)   Wt 113.4 kg   LMP 11/19/2021   SpO2 95%   BMI 35.87 kg/m   Physical Exam Vitals and nursing note reviewed.  Constitutional:      General: She  is not in acute distress.    Appearance: She is well-developed. She is not diaphoretic.  HENT:     Head: Normocephalic and atraumatic.  Eyes:     General: No scleral icterus.    Conjunctiva/sclera: Conjunctivae normal.  Pulmonary:     Effort: Pulmonary effort is normal. No respiratory distress.     Comments: Respirations even and unlabored Musculoskeletal:        General: Normal range of motion.     Cervical back: Normal range of motion.  Skin:    General: Skin is warm and dry.     Coloration: Skin is not pale.     Findings: No erythema or rash.  Neurological:     Mental Status: She is alert and oriented to person, place, and time.  Psychiatric:        Speech: Speech normal.        Behavior: Behavior is agitated. Behavior is cooperative.        Thought Content: Thought content includes suicidal ideation. Thought content does not include suicidal plan.     Comments: Mildly agitated, but cooperative     ED Results / Procedures / Treatments   Labs (all labs ordered are listed, but only abnormal results are displayed) Labs Reviewed  COMPREHENSIVE METABOLIC PANEL - Abnormal; Notable for the following components:  Result Value   Glucose, Bld 251 (*)    Calcium 10.7 (*)    All other components within normal limits  SALICYLATE LEVEL - Abnormal; Notable for the following components:   Salicylate Lvl <2.3 (*)    All other components within normal limits  ACETAMINOPHEN LEVEL - Abnormal; Notable for the following components:   Acetaminophen (Tylenol), Serum <10 (*)    All other components within normal limits  CBC - Abnormal; Notable for the following components:   RBC 5.49 (*)    All other components within normal limits  RAPID URINE DRUG SCREEN, HOSP PERFORMED - Abnormal; Notable for the following components:   Cocaine POSITIVE (*)    All other components within normal limits  CBG MONITORING, ED - Abnormal; Notable for the following components:   Glucose-Capillary 255 (*)     All other components within normal limits  SARS CORONAVIRUS 2 BY RT PCR  RESP PANEL BY RT-PCR (FLU A&B, COVID) [id number]  ETHANOL  I-STAT BETA HCG BLOOD, ED ([facility], [facility], [facility] ONLY)    EKG None  Radiology No results found.  Procedures Procedures    Medications Ordered in ED Medications  ibuprofen (ADVIL) tablet 600 mg (600 mg Oral Given 03/19/22 0352)  sucralfate (CARAFATE) 1 GM/10ML suspension 1 g (1 g Oral Not Given 03/19/22 0352)    ED Course/ Medical Decision Making/ A&P Clinical Course as of 03/19/22 0523  Fri Mar 19, 2022  0458 TTS completed. Evette Georges, NP recommends pt to be observed and reassessed by psychiatry. [KH]    Clinical Course User Index [KH] Antonietta Breach, PA-C                           Medical Decision Making Amount and/or Complexity of Data Reviewed Labs: ordered.  Risk OTC drugs. Prescription drug management.   This patient presents to the ED for concern of depression and SI, this involves an extensive number of treatment options, and is a complaint that carries with it a high risk of complications and morbidity.  The differential diagnosis includes MDD vs substance-induced mood disorder   Co morbidities that complicate the patient evaluation  Mental health problem   Additional history obtained:  Additional history obtained from EMS   Lab Tests:  I Ordered, and personally interpreted labs.  The pertinent results include:  Hyperglycemia of 251 (w/o DKA), UDS positive for cocaine.   Medicines ordered and prescription drug management:  I have reviewed the patients home medicines and have made adjustments as needed   Consultations Obtained:  I requested consultation with TTS and discussed lab and imaging findings as well as pertinent plan - they recommend: overnight observation with psychiatric reassessment in the morning.   Problem List / ED Course:  As above   Reevaluation:  After the interventions noted above, I reevaluated the  patient and found that they have :stayed the same   Social Determinants of Health:  Homeless    Dispostion:  Disposition pending psychiatric assessment and recommendations later this morning.  Care to be assumed by oncoming ED provider.         Final Clinical Impression(s) / ED Diagnoses Final diagnoses:  Suicidal ideation  Polysubstance abuse [facility])    Rx / DC Orders ED Discharge Orders     None         Antonietta Breach, PA-C 03/19/22 0525    Fatima Blank, MD 03/19/22 [phone]

## 2022-03-19 NOTE — Consult Note (Signed)
  Addendum to Consult Note: Initially patient was accepted to [facility], it was later found out that the boyfriend was admitted to [facility].  As per the policy, patient could not be admitted to the same [facility].  CSW found another appropriate placement options at [facility] for patient.  Garrison Columbus, NP Psychiatry

## 2022-03-19 NOTE — ED Provider Notes (Signed)
Emergency Medicine Observation Re-evaluation Note  Julie [name] is a 46 y.o. female, seen on rounds today.  Pt initially presented to the ED for complaints of Suicidal Currently, the patient is awaiting psych evaluation for suicidal thoughts.  Physical Exam  BP (!) 146/92 (BP Location: Left Arm)   Pulse (!) 103   Temp 98.4 F (36.9 C) (Oral)   Resp 20   Ht '5\' 10"'$  (1.778 m)   Wt 113.4 kg   LMP 11/19/2021   SpO2 95%   BMI 35.87 kg/m  Physical Exam In no acute distress  ED Course / MDM  EKG:   I have reviewed the labs performed to date as well as medications administered while in observation.  Recent changes in the last 24 hours include none.  Plan  Current plan is for wait for behavioral health consult for suicidal thoughts before disposition.    Milton Ferguson, MD 03/19/22 [phone]

## 2022-03-19 NOTE — Progress Notes (Signed)
Inpatient Behavioral Health  Pt meets inpatient criteria per Garrison Columbus, NP. There are no available beds at [facility] per [facility] Lynnda Shields, RN. Referral was sent to the following facilities;   Destination Service Provider Address Phone Fax  [facility]  [place], [place] [address] [place]  [facility]  [address] [place] [address] [phone]  [facility]  [place], [place] [address] [phone]  [facility]  [place] [address] [place] [address] [place]  [facility]  [address] [place] [address] [phone]  [place] [address], [place] [address] [phone]  [facility]  [place] [address] [phone]  [facility]  [address], [place] [address] [phone] [place]  [address] [place] [address] [phone]  [facility]  [address] [place] [address] [phone]    Situation ongoing,  CSW will follow up.   Benjaman Kindler, MSW, [facility] 03/19/2022  @ 3:44 PM

## 2022-03-20 LAB — RPR: RPR Ser Ql: NONREACTIVE

## 2022-03-20 LAB — T4: T4, Total: 6.9 ug/dL (ref 4.5–12.0)

## 2022-03-20 LAB — T3: T3, Total: 103 ng/dL (ref 71–180)

## 2022-07-20 ENCOUNTER — Emergency Department (HOSPITAL_COMMUNITY)
Admission: EM | Admit: 2022-07-20 | Discharge: 2022-07-21 | Disposition: A | Discharge status: Home / Self Care | Payer: Medicaid Other | Attending: Emergency Medicine | Admitting: Emergency Medicine

## 2022-07-20 ENCOUNTER — Other Ambulatory Visit: Payer: Self-pay

## 2022-07-20 ENCOUNTER — Emergency Department (HOSPITAL_COMMUNITY): Payer: Medicaid Other

## 2022-07-20 DIAGNOSIS — F17210 Nicotine dependence, cigarettes, uncomplicated: Secondary | ICD-10-CM | POA: Insufficient documentation

## 2022-07-20 DIAGNOSIS — Z1152 Encounter for screening for COVID-19: Secondary | ICD-10-CM | POA: Diagnosis not present

## 2022-07-20 DIAGNOSIS — R300 Dysuria: Secondary | ICD-10-CM | POA: Diagnosis present

## 2022-07-20 DIAGNOSIS — D72829 Elevated white blood cell count, unspecified: Secondary | ICD-10-CM | POA: Diagnosis not present

## 2022-07-20 DIAGNOSIS — J45909 Unspecified asthma, uncomplicated: Secondary | ICD-10-CM | POA: Diagnosis not present

## 2022-07-20 DIAGNOSIS — N3000 Acute cystitis without hematuria: Secondary | ICD-10-CM | POA: Diagnosis not present

## 2022-07-20 LAB — CBC
HCT: 45.8 % (ref 36.0–46.0)
Hemoglobin: 14.4 g/dL (ref 12.0–15.0)
MCH: 26.5 pg (ref 26.0–34.0)
MCHC: 31.4 g/dL (ref 30.0–36.0)
MCV: 84.3 fL (ref 80.0–100.0)
Platelets: 251 10*3/uL (ref 150–400)
RBC: 5.43 MIL/uL — ABNORMAL HIGH (ref 3.87–5.11)
RDW: 14.1 % (ref 11.5–15.5)
WBC: 11.5 10*3/uL — ABNORMAL HIGH (ref 4.0–10.5)
nRBC: 0 % (ref 0.0–0.2)

## 2022-07-20 LAB — URINALYSIS, ROUTINE W REFLEX MICROSCOPIC
Bilirubin Urine: NEGATIVE
Glucose, UA: 150 mg/dL — AB
Hgb urine dipstick: NEGATIVE
Ketones, ur: NEGATIVE mg/dL
Nitrite: POSITIVE — AB
Protein, ur: 30 mg/dL — AB
Specific Gravity, Urine: 1.016 (ref 1.005–1.030)
WBC, UA: >50 WBC/hpf (ref 0–5)
pH: 5 (ref 5.0–8.0)

## 2022-07-20 LAB — BASIC METABOLIC PANEL
Anion gap: 10 (ref 5–15)
BUN: 7 mg/dL (ref 6–20)
CO2: 23 mmol/L (ref 22–32)
Calcium: 10.4 mg/dL — ABNORMAL HIGH (ref 8.9–10.3)
Chloride: 106 mmol/L (ref 98–111)
Creatinine, Ser: 0.69 mg/dL (ref 0.44–1.00)
GFR, Estimated: >60 mL/min (ref >60)
Glucose, Bld: 267 mg/dL — ABNORMAL HIGH (ref 70–99)
Potassium: 3.6 mmol/L (ref 3.5–5.1)
Sodium: 139 mmol/L (ref 135–145)

## 2022-07-20 LAB — PREGNANCY, URINE: Preg Test, Ur: NEGATIVE

## 2022-07-20 LAB — RESP PANEL BY RT-PCR (RSV, FLU A&B, COVID)  RVPGX2
Influenza A by PCR: NEGATIVE
Influenza B by PCR: NEGATIVE
Resp Syncytial Virus by PCR: NEGATIVE
SARS Coronavirus 2 by RT PCR: NEGATIVE

## 2022-07-20 NOTE — ED Provider Triage Note (Signed)
Emergency Medicine Provider Triage Evaluation Note  Julie [name] , a 47 y.o. female  was evaluated in triage.  Pt complains of dysuria, cough, and headache. She states that 7 days ago she noticed some malodorous urine with dysuria. States that about that time she did some cocaine and since then she has had a cough with congestion. States that every time she coughs she urinates on herself. Denies fevers or chills, no shortness of breath, chest pain, abdominal pain, nausea, vomiting, or diarrhea.  Review of Systems  Positive:  Negative:   Physical Exam  There were no vitals taken for this visit. Gen:   Awake, no distress   Resp:  Normal effort  MSK:   Moves extremities without difficulty  Other:    Medical Decision Making  Medically screening exam initiated at 10:37 PM.  Appropriate orders placed.  Julie [name] was informed that the remainder of the evaluation will be completed by another provider, this initial triage assessment does not replace that evaluation, and the importance of remaining in the ED until their evaluation is complete.     Bud Face, PA-C 07/20/22 2239

## 2022-07-20 NOTE — ED Triage Notes (Signed)
Patient reports urinary incontinence with bladder pressure and concentrated malodorous urine for several days with intermittent headache .

## 2022-07-21 MED ORDER — CEPHALEXIN 500 MG PO CAPS: 500.0000 mg | ORAL_CAPSULE | Freq: Three times a day (TID) | ORAL | 0 refills | Status: AC

## 2022-07-21 MED ORDER — CEPHALEXIN 250 MG PO CAPS
500.0000 mg | ORAL_CAPSULE | Freq: Once | ORAL | Status: AC
  Administered 2022-07-21: 500 mg via ORAL
  Filled 2022-07-21: qty 2

## 2022-07-21 NOTE — ED Provider Notes (Signed)
[facility] Emergency Department Provider Note MRN:  [id number]  Arrival date & time: 07/21/22     Chief Complaint   Dysuria History of Present Illness   Julie [name] is a 47 y.o. year-old female with a history of asthma presenting to the ED with chief complaint of dysuria.  Increased frequency, foul smell to urine, and suprapubic/bladder pressure for the past several days.  Leaks urine sometimes when she is laughing or coughing but this has been going on for a long time.  No fever, no back pain, no other complaints.  Review of Systems  A thorough review of systems was obtained and all systems are negative except as noted in the HPI and PMH.   Patient's Health History    Past Medical History:  Diagnosis Date   Asthma    Fracture, humerus closed, shaft 07/11/2014   left   History of bronchitis    Mental health problem    Open fracture of great toe of left foot 12/25/2015    Past Surgical History:  Procedure Laterality Date   CESAREAN SECTION     ECTOPIC PREGNANCY SURGERY  2003   INCISION AND DRAINAGE Left 12/25/2015   Procedure: INCISION AND DRAINAGE With  Amputation of distal tip of Left Great and Second Toe.;  Surgeon: Leandrew Koyanagi, MD;  Location: [facility];  Service: Orthopedics;  Laterality: Left;   LIPOMA EXCISION     chest   ORIF HUMERUS FRACTURE Left 07/15/2014   Procedure: OPEN REDUCTION INTERNAL FIXATION (ORIF) LEFT HUMERAL SHAFT ;  Surgeon: Renette Butters, MD;  Location: [facility];  Service: Orthopedics;  Laterality: Left;   TUBAL LIGATION  2003    No family history on file.  Social History   Socioeconomic History   Marital status: Single    Spouse name: Not on file   Number of children: Not on file   Years of education: Not on file   Highest education level: Not on file  Occupational History   Not on file  Tobacco Use   Smoking status: Every Day    Years: 18.00    Types: Cigarettes   Smokeless tobacco: Never    Tobacco comments:    6 cig./day  Vaping Use   Vaping Use: Never used  Substance and Sexual Activity   Alcohol use: Yes    Comment: occasionally   Drug use: Yes    Types: Marijuana, "Crack" cocaine, MDMA (Ecstacy)    Comment: Crack, THC, Molly   Sexual activity: Yes    Birth control/protection: I.U.D.  Other Topics Concern   Not on file  Social History Narrative   Not on file   Social Determinants of Health   Financial Resource Strain: Not on file  Food Insecurity: Food Insecurity Present (03/05/2019)   Hunger Vital Sign    Worried About Running Out of Food in the Last Year: Often true    Ran Out of Food in the Last Year: Often true  Transportation Needs: Unmet Transportation Needs (03/05/2019)   PRAPARE - Hydrologist (Medical): Yes    Lack of Transportation (Non-Medical): Yes  Physical Activity: Not on file  Stress: Not on file  Social Connections: Not on file  Intimate Partner Violence: Not on file     Physical Exam   Vitals:   07/21/22 0030 07/21/22 0052  BP: (!) 176/91   Pulse: 90   Resp:  20  Temp:  98.1 F (36.7 C)  SpO2: 97%     CONSTITUTIONAL: Well-appearing, NAD NEURO/PSYCH:  Alert and oriented x 3, no focal deficits EYES:  eyes equal and reactive ENT/NECK:  no LAD, no JVD CARDIO: Regular rate, well-perfused, normal S1 and S2 PULM:  CTAB no wheezing or rhonchi GI/GU:  non-distended, non-tender MSK/SPINE:  No gross deformities, no edema SKIN:  no rash, atraumatic   *Additional and/or pertinent findings included in MDM below  Diagnostic and Interventional Summary    EKG Interpretation  Date/Time:    Ventricular Rate:    PR Interval:    QRS Duration:   QT Interval:    QTC Calculation:   R Axis:     Text Interpretation:         Labs Reviewed  URINALYSIS, ROUTINE W REFLEX MICROSCOPIC - Abnormal; Notable for the following components:      Result Value   APPearance CLOUDY (*)    Glucose, UA 150 (*)    Protein,  ur 30 (*)    Nitrite POSITIVE (*)    Leukocytes,Ua LARGE (*)    Bacteria, UA MANY (*)    All other components within normal limits  CBC - Abnormal; Notable for the following components:   WBC 11.5 (*)    RBC 5.43 (*)    All other components within normal limits  BASIC METABOLIC PANEL - Abnormal; Notable for the following components:   Glucose, Bld 267 (*)    Calcium 10.4 (*)    All other components within normal limits  RESP PANEL BY RT-PCR (RSV, FLU A&B, COVID)  RVPGX2  PREGNANCY, URINE    DG Chest 2 View  Final Result      Medications  cephALEXin (KEFLEX) capsule 500 mg (has no administration in time range)     Procedures  /  Critical Care Procedures  ED Course and Medical Decision Making  Initial Impression and Ddx Patient sitting comfortably in no acute distress, abdomen soft and nontender, no flank pain, here for basically dysuria.  Urinalysis obtained in triage is nitrate positive consistent with UTI.  I see no other complicating features to this visit to the emergency department, appropriate for discharge on antibiotics.  Patient has had some cough recently and so chest x-ray was ordered in triage revealing a possible nodule, doubt pneumonia, patient made aware of this finding.  Past medical/surgical history that increases complexity of ED encounter: None  Interpretation of Diagnostics I personally reviewed the laboratory assessment and my interpretation is as follows: No significant blood count or electrolyte disturbance    Patient Reassessment and Ultimate Disposition/Management     Discharge  Patient management required discussion with the following services or consulting groups:  None  Complexity of Problems Addressed Acute complicated illness or Injury  Additional Data Reviewed and Analyzed Further history obtained from: Further history from spouse/family member  Additional Factors Impacting ED Encounter Risk Prescriptions  Barth Kirks. Sedonia Small,  MD [place] [email or web address]  Final Clinical Impressions(s) / ED Diagnoses     ICD-10-CM   1. Acute cystitis without hematuria  N30.00       ED Discharge Orders          Ordered    cephALEXin (KEFLEX) 500 MG capsule  3 times daily        07/21/22 0048             Discharge Instructions Discussed with and Provided to Patient:    Discharge Instructions      You were evaluated  in the Emergency Department and after careful evaluation, we did not find any emergent condition requiring admission or further testing in the hospital.  Your exam/testing today was overall reassuring.  Symptoms seem to be due to a urinary tract infection.  Take the Keflex antibiotic as directed.  Your chest x-ray showed a small nodule, recommend repeat chest x-ray within the next 1 or 2 months.  Recommend follow-up with a primary care doctor.  Please return to the Emergency Department if you experience any worsening of your condition.  Thank you for allowing [place] to be a part of your care.       Maudie Flakes, MD 07/21/22 [phone]

## 2022-07-21 NOTE — Discharge Instructions (Signed)
You were evaluated in the Emergency Department and after careful evaluation, we did not find any emergent condition requiring admission or further testing in the hospital.  Your exam/testing today was overall reassuring.  Symptoms seem to be due to a urinary tract infection.  Take the Keflex antibiotic as directed.  Your chest x-ray showed a small nodule, recommend repeat chest x-ray within the next 1 or 2 months.  Recommend follow-up with a primary care doctor.  Please return to the Emergency Department if you experience any worsening of your condition.  Thank you for allowing [place] to be a part of your care.

## 2022-10-26 ENCOUNTER — Emergency Department (HOSPITAL_COMMUNITY)
Admission: EM | Admit: 2022-10-26 | Discharge: 2022-10-27 | Disposition: A | Discharge status: Home / Self Care | Payer: Medicaid Other | Attending: Emergency Medicine | Admitting: Emergency Medicine

## 2022-10-26 DIAGNOSIS — K146 Glossodynia: Secondary | ICD-10-CM | POA: Insufficient documentation

## 2022-10-26 DIAGNOSIS — R7989 Other specified abnormal findings of blood chemistry: Secondary | ICD-10-CM | POA: Diagnosis not present

## 2022-10-26 DIAGNOSIS — E1165 Type 2 diabetes mellitus with hyperglycemia: Secondary | ICD-10-CM | POA: Diagnosis not present

## 2022-10-26 DIAGNOSIS — R002 Palpitations: Secondary | ICD-10-CM | POA: Insufficient documentation

## 2022-10-26 DIAGNOSIS — I10 Essential (primary) hypertension: Secondary | ICD-10-CM | POA: Insufficient documentation

## 2022-10-26 DIAGNOSIS — F1410 Cocaine abuse, uncomplicated: Secondary | ICD-10-CM | POA: Insufficient documentation

## 2022-10-26 NOTE — ED Triage Notes (Signed)
Patient arrived stating she used cocaine tonight about two hours ago and feels nauseated and "weird" states she uses cocaine regularly and hasn't felt like this before so concerned she was given something else. Requesting detox.

## 2022-10-27 ENCOUNTER — Emergency Department (HOSPITAL_COMMUNITY): Payer: Medicaid Other

## 2022-10-27 LAB — CBC
HCT: 47.7 % — ABNORMAL HIGH (ref 36.0–46.0)
Hemoglobin: 15.1 g/dL — ABNORMAL HIGH (ref 12.0–15.0)
MCH: 26.9 pg (ref 26.0–34.0)
MCHC: 31.7 g/dL (ref 30.0–36.0)
MCV: 85 fL (ref 80.0–100.0)
Platelets: 208 10*3/uL (ref 150–400)
RBC: 5.61 MIL/uL — ABNORMAL HIGH (ref 3.87–5.11)
RDW: 14.3 % (ref 11.5–15.5)
WBC: 8.3 10*3/uL (ref 4.0–10.5)
nRBC: 0 % (ref 0.0–0.2)

## 2022-10-27 LAB — TROPONIN I (HIGH SENSITIVITY)
Troponin I (High Sensitivity): 19 ng/L — ABNORMAL HIGH (ref <18)
Troponin I (High Sensitivity): 17 ng/L (ref <18)

## 2022-10-27 LAB — BASIC METABOLIC PANEL
Anion gap: 8 (ref 5–15)
BUN: 6 mg/dL (ref 6–20)
CO2: 23 mmol/L (ref 22–32)
Calcium: 10 mg/dL (ref 8.9–10.3)
Chloride: 105 mmol/L (ref 98–111)
Creatinine, Ser: 0.68 mg/dL (ref 0.44–1.00)
GFR, Estimated: >60 mL/min (ref >60)
Glucose, Bld: 301 mg/dL — ABNORMAL HIGH (ref 70–99)
Potassium: 3.5 mmol/L (ref 3.5–5.1)
Sodium: 136 mmol/L (ref 135–145)
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
## 2022-10-27 NOTE — Discharge Instructions (Addendum)
Provided with resources for residential treatment guide, can also go to behavioral health urgent care. Call ENT to schedule an appointment for further evaluation of your tongue.

## 2022-11-11 ENCOUNTER — Emergency Department (HOSPITAL_COMMUNITY): Payer: Medicaid Other

## 2022-11-11 ENCOUNTER — Other Ambulatory Visit: Payer: Self-pay

## 2022-11-11 ENCOUNTER — Emergency Department (HOSPITAL_COMMUNITY)
Admission: EM | Admit: 2022-11-11 | Discharge: 2022-11-11 | Disposition: A | Discharge status: Home / Self Care | Payer: Medicaid Other | Attending: Student | Admitting: Student

## 2022-11-11 ENCOUNTER — Encounter (HOSPITAL_COMMUNITY): Payer: Self-pay | Admitting: Emergency Medicine

## 2022-11-11 DIAGNOSIS — J45909 Unspecified asthma, uncomplicated: Secondary | ICD-10-CM | POA: Insufficient documentation

## 2022-11-11 DIAGNOSIS — F17210 Nicotine dependence, cigarettes, uncomplicated: Secondary | ICD-10-CM | POA: Diagnosis not present

## 2022-11-11 DIAGNOSIS — K1370 Unspecified lesions of oral mucosa: Secondary | ICD-10-CM | POA: Insufficient documentation

## 2022-11-11 DIAGNOSIS — R102 Pelvic and perineal pain: Secondary | ICD-10-CM | POA: Diagnosis present

## 2022-11-11 DIAGNOSIS — K148 Other diseases of tongue: Secondary | ICD-10-CM

## 2022-11-11 DIAGNOSIS — N898 Other specified noninflammatory disorders of vagina: Secondary | ICD-10-CM

## 2022-11-11 LAB — CBC WITH DIFFERENTIAL/PLATELET
Abs Immature Granulocytes: 0.02 10*3/uL (ref 0.00–0.07)
Basophils Absolute: 0.1 10*3/uL (ref 0.0–0.1)
Basophils Relative: 1 %
Eosinophils Absolute: 0 10*3/uL (ref 0.0–0.5)
Eosinophils Relative: 1 %
HCT: 46.5 % — ABNORMAL HIGH (ref 36.0–46.0)
Hemoglobin: 14.9 g/dL (ref 12.0–15.0)
Immature Granulocytes: 0 %
Lymphocytes Relative: 27 %
Lymphs Abs: 2.3 10*3/uL (ref 0.7–4.0)
MCH: 26.7 pg (ref 26.0–34.0)
MCHC: 32 g/dL (ref 30.0–36.0)
MCV: 83.3 fL (ref 80.0–100.0)
Monocytes Absolute: 0.7 10*3/uL (ref 0.1–1.0)
Monocytes Relative: 9 %
Neutro Abs: 5.3 10*3/uL (ref 1.7–7.7)
Neutrophils Relative %: 62 %
Platelets: 181 10*3/uL (ref 150–400)
RBC: 5.58 MIL/uL — ABNORMAL HIGH (ref 3.87–5.11)
RDW: 14.2 % (ref 11.5–15.5)
WBC: 8.4 10*3/uL (ref 4.0–10.5)
nRBC: 0 % (ref 0.0–0.2)

## 2022-11-11 LAB — COMPREHENSIVE METABOLIC PANEL
ALT: 19 U/L (ref 0–44)
AST: 18 U/L (ref 15–41)
Albumin: 3.6 g/dL (ref 3.5–5.0)
Alkaline Phosphatase: 68 U/L (ref 38–126)
Anion gap: 10 (ref 5–15)
BUN: 6 mg/dL (ref 6–20)
CO2: 25 mmol/L (ref 22–32)
Calcium: 10.3 mg/dL (ref 8.9–10.3)
Chloride: 101 mmol/L (ref 98–111)
Creatinine, Ser: 0.76 mg/dL (ref 0.44–1.00)
GFR, Estimated: >60 mL/min (ref >60)
Glucose, Bld: 367 mg/dL — ABNORMAL HIGH (ref 70–99)
Potassium: 4 mmol/L (ref 3.5–5.1)
Sodium: 136 mmol/L (ref 135–145)
Total Bilirubin: 0.3 mg/dL (ref 0.3–1.2)
Total Protein: 7.2 g/dL (ref 6.5–8.1)

## 2022-11-11 LAB — RAPID HIV SCREEN (HIV 1/2 AB+AG)
HIV 1/2 Antibodies: NONREACTIVE
HIV-1 P24 Antigen - HIV24: NONREACTIVE

## 2022-11-11 LAB — I-STAT BETA HCG BLOOD, ED (MC, WL, AP ONLY): I-stat hCG, quantitative: <5 m[IU]/mL (ref <5)

## 2022-11-11 LAB — RPR: RPR Ser Ql: NONREACTIVE

## 2022-11-11 MED ORDER — METHYLPREDNISOLONE 4 MG PO TABS: ORAL_TABLET | ORAL | 0 refills | Status: DC

## 2022-11-11 MED ORDER — IOHEXOL 350 MG/ML SOLN
75.0000 mL | Freq: Once | INTRAVENOUS | Status: AC | PRN
  Administered 2022-11-11: 75 mL via INTRAVENOUS

## 2022-11-11 MED ORDER — ALBUTEROL SULFATE HFA 108 (90 BASE) MCG/ACT IN AERS
1.0000 | INHALATION_SPRAY | Freq: Once | RESPIRATORY_TRACT | Status: AC
  Administered 2022-11-11: 1 via RESPIRATORY_TRACT
  Filled 2022-11-11: qty 6.7

## 2022-11-11 MED ORDER — IPRATROPIUM-ALBUTEROL 0.5-2.5 (3) MG/3ML IN SOLN
3.0000 mL | Freq: Once | RESPIRATORY_TRACT | Status: AC
  Administered 2022-11-11: 3 mL via RESPIRATORY_TRACT
  Filled 2022-11-11: qty 3

## 2022-11-11 NOTE — Discharge Instructions (Addendum)
Day 1: Take 8 mg (2 tablets) before breakfast; 4 mg (1 tablet) after lunch; 4 mg (1 tablet) after dinner; 8 mg (2 tablets) at bedtime)    Day 2: Take 8 mg (2 tablets) before breakfast; 4 mg (1 tablet) after lunch; 4 mg (1 tablet) after dinner; 8 mg (2 tablets) at bedtime)   Day 3: Take 4 mg (1 tablet) before breakfast; 4 mg (1 tablet) after lunch; 4 mg (1 tablet) after dinner; 8 mg (2 tablets) at bedtime   Day 4: Take 4 mg (1 tablet) before breakfast; 4 mg (1 tablet) after lunch; 4 mg (1 tablet) after dinner; 8 mg (2 tablets) at bedtime   Day 5: Take 4 mg (1 tablet) before breakfast; 4 mg (1 tablet) after lunch; 4 mg (1 tablet) after dinner; 4 mg (1 tablet) at bedtime   Day 6: Take 4 mg (1 tablet) before breakfast; 4 mg (1 tablet) after lunch; 4 mg (1 tablet) after dinner; 4 mg (1 tablet) at bedtime   Day 7: Take 4 mg (1 tablet) before breakfast; 4 mg (1 tablet) after lunch; 4 mg (1 tablet) at bedtime    Day 8: Take 4 mg (1 tablet) before breakfast; 4 mg (1 tablet) after lunch; 4 mg (1 tablet) at bedtime   Day 9: Take 4 mg (1 tablet) before breakfast; 4 mg (1 tablet) at bedtime   Day 10: Take 4 mg (1 tablet) before breakfast; 4 mg (1 tablet) at bedtime   Day 11: Take 4 mg (1 tablet) before breakfast   Day 12: Take 4 mg (1 tablet) before breakfast

## 2022-11-11 NOTE — ED Triage Notes (Signed)
Pt in with reported sores to mouth and vagina. R tongue white and painful, and pt states she has irritating bumps from vagina to rectum

## 2022-11-11 NOTE — ED Provider Notes (Signed)
[facility]MENT AT [facility] Provider Note  CSN: [id number] Arrival date & time: 11/11/22 4098  Chief Complaint(s) Mouth Pain and Vaginal Pain  HPI Julie [name] is a 47 y.o. female with PMH asthma, crack cocaine use who presents emergency room for evaluation of mouth and vaginal pain.  She states that for multiple months she has had a sore on the right side of her tongue that over the last 24 hours it started to turn white and now she is having difficulty talking with muffled voice.  She also endorses painful and pruritic vaginal irritation that has been present for proximately 6 weeks.  She currently denies chest pain, shortness of breath, abdominal pain, nausea, vomiting or other systemic symptoms.  She states she primarily smokes crack cocaine and does not use any IV drugs.  States that she has not had high risk sexual exposures and last sexual contact was 6 months ago.  Unsure if she has a history of herpes.  No bloody bowel movements, abdominal pain, nausea or vomiting.  No history of Behcet's disease.   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 Authorizing Provider  acetaminophen (TYLENOL) 500 MG tablet Take 2,000 mg by mouth 2 (two) times daily as needed (pain). Patient not taking: Reported on 07/20/2022     [provider]  doxycycline (VIBRAMYCIN) 100 MG capsule Take 1 capsule (100 mg total) by mouth 2 (two) times daily. Patient not taking: Reported on 03/19/2022 01/30/22   Petrucelli, Lelon Mast R, PA-C  methylPREDNISolone (MEDROL) 4 MG tablet Day 1&2: Take 8 mg (2 tablets) before breakfast; 4 mg (1 tablet) after lunch; 4 mg (1 tablet) after dinner; 8 mg (2 tablets) at bedtime)  Day 3&4: Take 4 mg (1 tablet) before breakfast; 4 mg (1 tablet) after lunch; 4 mg (1 tablet) after dinner; 8 mg (2 tablets) at bedtime Day 5&6: Take 4 mg (1 tablet) before breakfast; 4 mg (1 tablet) after lunch; 4 mg (1 tablet) after dinner; 4 mg (1 tablet) at bedtime Day 7&8: Take 4 mg (1 tablet) before breakfast; 4 mg (1 tablet) after lunch; 4 mg (1 tablet) at bedtime  Day 9&10: Take 4 mg (1 tablet) before breakfast; 4 mg (1 tablet) at bedtime Day 11&12: Take 4 mg (1 tablet) before breakfast 11/11/22   Trampus Mcquerry, MD  metroNIDAZOLE (FLAGYL) 500 MG tablet Take 1 tablet (500 mg total) by mouth 2 (two) times daily. Patient not taking: Reported on 03/19/2022 01/30/22   Petrucelli, Pleas Koch, PA-C  valACYclovir (VALTREX) 500 MG tablet Take 1 tablet (500 mg total) by mouth 2 (two) times daily. Patient not taking: Reported on 03/19/2022 01/30/22   Petrucelli, Pleas Koch, PA-C  Past Surgical History Past Surgical History:  Procedure Laterality Date   CESAREAN SECTION     ECTOPIC PREGNANCY SURGERY  2003   INCISION AND DRAINAGE Left 12/25/2015   Procedure: INCISION AND DRAINAGE With  Amputation of distal tip of Left Great and Second Toe.;  Surgeon: Tarry Kos, MD;  Location: [facility];  Service: Orthopedics;  Laterality: Left;   LIPOMA EXCISION     chest   ORIF HUMERUS FRACTURE Left 07/15/2014   Procedure: OPEN REDUCTION INTERNAL FIXATION (ORIF) LEFT HUMERAL SHAFT ;  Surgeon: Sheral Apley, MD;   Location: [facility];  Service: Orthopedics;  Laterality: Left;   TUBAL LIGATION  2003   Family History History reviewed. No pertinent family history.  Social History Social History   Tobacco Use   Smoking status: Every Day    Years: 18    Types: Cigarettes   Smokeless tobacco: Never   Tobacco comments:    6 cig./day  Vaping Use   Vaping Use: Never used  Substance Use Topics   Alcohol use: Yes    Comment: occasionally   Drug use: Yes    Types: Marijuana, "Crack" cocaine, MDMA (Ecstacy)    Comment: Crack, THC, Molly   Allergies Patient has no known allergies.  Review of Systems Review of Systems  HENT:  Positive for mouth sores.   Genitourinary:  Positive for vaginal pain.    Physical Exam Vital Signs  I have reviewed the triage vital signs BP (!) 139/91 (BP Location: Right Arm)   Pulse 90   Temp 98.5 F (36.9 C) (Oral)   Resp (!) 22   Wt 117.9 kg   SpO2 95%   BMI 36.25 kg/m   Physical Exam Vitals and nursing note reviewed.  Constitutional:      General: She is not in acute distress.    Appearance: She is well-developed.  HENT:     Head: Normocephalic and atraumatic.     Mouth/Throat:     Comments: Painful indentation of the tongue on the right, white, nonscrappable Eyes:     Conjunctiva/sclera: Conjunctivae normal.  Cardiovascular:     Rate and Rhythm: Normal rate and regular rhythm.     Heart sounds: No murmur heard. Pulmonary:     Effort: Pulmonary effort is normal. No respiratory distress.     Breath sounds: Normal breath sounds.  Abdominal:     Palpations: Abdomen is soft.     Tenderness: There is no abdominal tenderness.  Genitourinary:    Comments: Pale velvety skin around the vulva Musculoskeletal:        General: No swelling.     Cervical back: Neck supple.  Skin:    General: Skin is warm and dry.     Capillary Refill: Capillary refill takes less than 2 seconds.  Neurological:     Mental Status: She is alert.   Psychiatric:        Mood and Affect: Mood normal.     ED Results and Treatments Labs (all labs ordered are listed, but only abnormal results are displayed) Labs Reviewed  COMPREHENSIVE METABOLIC PANEL - Abnormal; Notable for the following components:      Result Value   Glucose, Bld 367 (*)    All other components within normal limits  CBC WITH DIFFERENTIAL/PLATELET - Abnormal; Notable for the following components:   RBC 5.58 (*)    HCT 46.5 (*)    All other components within normal limits  RAPID HIV SCREEN (HIV 1/2 AB+AG)  RPR  I-STAT BETA HCG BLOOD, ED ([facility], [facility], [facility] ONLY)                                                                                                                          Radiology DG Chest Portable 1 View  Result Date: 11/11/2022 CLINICAL DATA:  Shortness of breath EXAM: PORTABLE CHEST 1 VIEW COMPARISON:  10/27/2022 FINDINGS: The heart size and mediastinal contours are within normal limits. Both lungs are clear. The visualized skeletal structures are unremarkable. IMPRESSION: No active disease. Electronically Signed   By: Paulina Fusi M.D.   On: 11/11/2022 11:06   CT Soft Tissue Neck W Contrast  Result Date: 11/11/2022 CLINICAL DATA:  Provided history: Head/neck cancer, monitor. 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 Muffled voice. Concern for malignancy. EXAM: CT NECK WITH CONTRAST TECHNIQUE: Multidetector CT imaging of the neck was performed using the standard protocol following the bolus administration of intravenous contrast. RADIATION DOSE REDUCTION: This exam was performed according to the departmental dose-optimization program which includes automated exposure control, adjustment of the mA and/or kV according to patient size and/or use of iterative reconstruction technique. CONTRAST:  75mL OMNIPAQUE IOHEXOL 350 MG/ML SOLN COMPARISON:  No pertinent prior exams available for comparison. FINDINGS: Pharynx and larynx: Poor dentition with multiple absent and carious  teeth and multifocal periapical lucency. No appreciable swelling or mass within the oral cavity, pharynx or larynx. Punctate calcific focus within the left palatine tonsil, which may reflect a postinflammatory calcification or tonsillith. Salivary glands: No inflammation, mass, or stone. Thyroid: Unremarkable. Lymph nodes: No pathologically enlarged lymph nodes are identified within the neck. Vascular: The right vertebral artery is poorly delineated. The major vascular structures of the neck are otherwise patent. Limited intracranial: No evidence of an acute intracranial abnormality within the field of view. Visualized orbits: Incompletely imaged. No orbital mass or acute orbital finding at the imaged levels. Mastoids and visualized paranasal sinuses: Portions of the frontal sinuses are excluded from the field of view superiorly. Moderate mucosal thickening within anterior right ethmoid air cells. Small-volume frothy secretions, and background severe mucosal thickening, within the right maxillary sinus. Moderate-to-severe partial opacification of the left maxillary sinus due to the presence of secretions and mucosal thickening. No significant mastoid effusion. Skeleton: Poor dentition with multiple absent and carious teeth and multifocal periapical lucency. Cervical spondylosis. Upper chest: No consolidation within the imaged lung apices. Other: 2.1 x 1.6 cm fluid attenuation cutaneous/subcutaneous lesion within the anterior left lower neck. Impressions #6 and #7 will be called to the ordering clinician or representative by the Radiologist Assistant, and communication documented in the PACS or [employer]. IMPRESSION: 1. No appreciable swelling or mass within the oral cavity, pharynx or larynx. Specifically, the reported oral lesions are not visible by CT. Correlate with physical exam findings. 2. No cervical lymphadenopathy. 3. Paranasal sinus disease as described. 4. Punctate postinflammatory calcification or  tonsillith within the left palatine tonsil. 5. Poor dentition with multiple absent and carious teeth.  Additionally, there is multifocal periapical lucency consistent with periodontal disease. 6. 2.1 x 1.6 cm cystic appearing cutaneous/subcutaneous lesion within the anterior left lower neck. Correlate with physical exam findings and consider aspiration. 7. The right vertebral artery is poorly delineated and may be occluded. Consider CT or MR angiography of the neck for further evaluation. Electronically Signed   By: Jackey Loge D.O.   On: 11/11/2022 10:05    Pertinent labs & imaging results that were available during my care of the patient were reviewed by me and considered in my medical decision making (see MDM for details).  Medications Ordered in ED Medications  iohexol (OMNIPAQUE) 350 MG/ML injection 75 mL (75 mLs Intravenous Contrast Given 11/11/22 0943)  ipratropium-albuterol (DUONEB) 0.5-2.5 (3) MG/3ML nebulizer solution 3 mL (3 mLs Nebulization Given 11/11/22 1101)  albuterol (VENTOLIN HFA) 108 (90 Base) MCG/ACT inhaler 1 puff (1 puff Inhalation Given 11/11/22 1152)                                                                                                                                     Procedures Procedures  (including critical care time)  Medical Decision Making / ED Course   This patient presents to the ED for concern of lesions of tongue and perineum, this involves an extensive number of treatment options, and is a complaint that carries with it a high risk of complications and morbidity.  The differential diagnosis includes herpes simplex, Behcet's disease, mucositis, lichen planus, yeast, oral hairy leukoplakia, thrush  MDM: Patient seen  in the emergency department for evaluation of lesions of the mouth and perineum.  Physical exam with a tender indentation to the lateral aspect of the right tongue that is white and does not scrape off, vaginal exam revealing pale velvety  skin around the vulva.  Pulmonary exam also showing bilateral wheezing but patient does not have significant accessory muscle use.  Laboratory evaluation is largely unremarkable outside of a glucose of 376.  No anion gap and bicarb is normal.  Pregnancy negative.  RPR and HIV both negative.  Chest x-ray unremarkable.  CT soft tissue neck showing no evidence of malignancy with no associated cervical lymphadenopathy but does show paraspinal sinus disease likely tonsil stone, poor dentition, a 2.1 x 1.6 cm cystic cutaneous/subcutaneous lesion in the anterior left lower neck and a possible right-sided vertebral artery occlusion.  Patient has no neurologic deficits, has had no syncope or dizziness and I have low suspicion that this incidental finding is of clinical relevance today and can be investigated in the outpatient setting if she is to have any relevant symptoms.  In regards to the cystic lesion, there is no physical exam evidence of this and I also have lower suspicion that this is of clinical relevance today.  In regards to the above lesions, differential includes oral hairy leukoplakia vs lichen planus and I spoke with Dr. Suszanne Conners of ENT who recommends  a 12-day steroid taper with outpatient follow-up with an ENT after completion of steroids to monitor for improvement.  Patient received a single DuoNeb for her wheezing and wheezing symptomatically improved.  She does not have a albuterol inhaler and thus mom was brought to her at bedside.  At this time she does not meet inpatient criteria for admission and she is safe for discharge with outpatient ENT follow-up.  I did explain all of her incidental findings on her CT today as well and patient voiced understanding of these as well as appropriate return precautions.  Patient then discharged with outpatient follow-up.   Additional history obtained: -Additional history obtained from husband -External records from outside source obtained and reviewed including:  Chart review including previous notes, labs, imaging, consultation notes   Lab Tests: -I ordered, reviewed, and interpreted labs.   The pertinent results include:   Labs Reviewed  COMPREHENSIVE METABOLIC PANEL - Abnormal; Notable for the following components:      Result Value   Glucose, Bld 367 (*)    All other components within normal limits  CBC WITH DIFFERENTIAL/PLATELET - Abnormal; Notable for the following components:   RBC 5.58 (*)    HCT 46.5 (*)    All other components within normal limits  RAPID HIV SCREEN (HIV 1/2 AB+AG)  RPR  I-STAT BETA HCG BLOOD, ED ([facility], [facility], [facility] ONLY)     Imaging Studies ordered: I ordered imaging studies including CT soft tissue neck, chest x-ray I independently visualized and interpreted imaging. I agree with the radiologist interpretation   Medicines ordered and prescription drug management: Meds ordered this encounter  Medications   iohexol (OMNIPAQUE) 350 MG/ML injection 75 mL   ipratropium-albuterol (DUONEB) 0.5-2.5 (3) MG/3ML nebulizer solution 3 mL   albuterol (VENTOLIN HFA) 108 (90 Base) MCG/ACT inhaler 1 puff   DISCONTD: methylPREDNISolone (MEDROL) 4 MG tablet    Sig: Day 1&2: Take 8 mg (2 tablets) before breakfast; 4 mg (1 tablet) after lunch; 4 mg (1 tablet) after dinner; 8 mg (2 tablets) at bedtime)  Day 3&4: Take 4 mg (1 tablet) before breakfast; 4 mg (1 tablet) after lunch; 4 mg (1 tablet) after dinner; 8 mg (2 tablets) at bedtime Day 5&6: Take 4 mg (1 tablet) before breakfast; 4 mg (1 tablet) after lunch; 4 mg (1 tablet) after dinner; 4 mg (1 tablet) at bedtime Day 7&8: Take 4 mg (1 tablet) before breakfast; 4 mg (1 tablet) after lunch; 4 mg (1 tablet) at bedtime  Day 9&10: Take 4 mg (1 tablet) before breakfast; 4 mg (1 tablet) at bedtime Day 11&12: Take 4 mg (1 tablet) before breakfast    Dispense:  42 tablet    Refill:  0   methylPREDNISolone (MEDROL) 4 MG tablet    Sig: Day 1&2: Take 8 mg (2 tablets) before breakfast; 4 mg (1  tablet) after lunch; 4 mg (1 tablet) after dinner; 8 mg (2 tablets) at bedtime)  Day 3&4: Take 4 mg (1 tablet) before breakfast; 4 mg (1 tablet) after lunch; 4 mg (1 tablet) after dinner; 8 mg (2 tablets) at bedtime Day 5&6: Take 4 mg (1 tablet) before breakfast; 4 mg (1 tablet) after lunch; 4 mg (1 tablet) after dinner; 4 mg (1 tablet) at bedtime Day 7&8: Take 4 mg (1 tablet) before breakfast; 4 mg (1 tablet) after lunch; 4 mg (1 tablet) at bedtime  Day 9&10: Take 4 mg (1 tablet) before breakfast; 4 mg (1 tablet) at bedtime Day 11&12: Take 4  mg (1 tablet) before breakfast    Dispense:  42 tablet    Refill:  0    -I have reviewed the patients home medicines and have made adjustments as needed  Critical interventions none  Consultations Obtained: I requested consultation with the ENT on-call Dr. Suszanne Conners,  and discussed lab and imaging findings as well as pertinent plan - they recommend: 12-day steroid taper and outpatient follow-up   Cardiac Monitoring: The patient was maintained on a cardiac monitor.  I personally viewed and interpreted the cardiac monitored which showed an underlying rhythm of: NSR  Social Determinants of Health:  Factors impacting patients care include: Crack cocaine use, does not have albuterol rescue inhaler for home use   Reevaluation: After the interventions noted above, I reevaluated the patient and found that they have :improved  Co morbidities that complicate the patient evaluation  Past Medical History:  Diagnosis Date   Asthma    Fracture, humerus closed, shaft 07/11/2014   left   History of bronchitis    Mental health problem    Open fracture of great toe of left foot 12/25/2015      Dispostion: I considered admission for this patient, but at this time she does not meet inpatient criteria for admission and she is safe for discharge with outpatient follow-up     Final Clinical Impression(s) / ED Diagnoses Final diagnoses:  Vaginal lesion  Lesion of  tongue     @PCDICTATION @    Glendora Score, MD 11/11/22 1900

## 2022-11-11 NOTE — ED Notes (Signed)
Patient and family was given a [place] Sandwich bag with [employer], peanut butter and drinks with cup of ice.

## 2022-11-13 IMAGING — CT CT ABD-PELV W/ CM
2 of 5 series · 15 of 46 positions shown, 17 images · IV contrast (agent unspecified)
Comparison: None similar

CLINICAL DATA: Right lower quadrant abdominal pain. Positive for
Trichomonas.

EXAM:
CT ABDOMEN AND PELVIS WITH CONTRAST
TECHNIQUE: Multidetector CT imaging of the abdomen and pelvis was performed
using the standard protocol following bolus administration of
intravenous contrast.

[Series 2: axial st · axial · 0.95mm/px · z∈[+1103,+1528]mm · 12 of 101 slices shown, 14 images]
[im 8/101  soft-tissue]
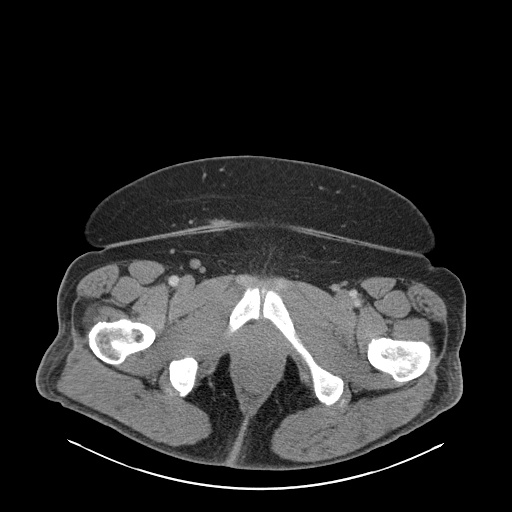
[im 8/101  bone]
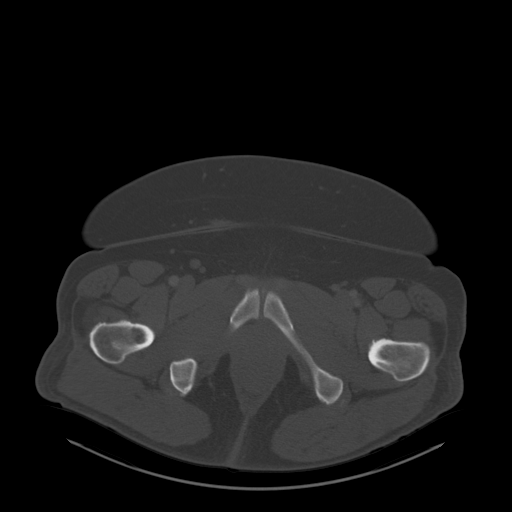
[im 16/101  soft-tissue]
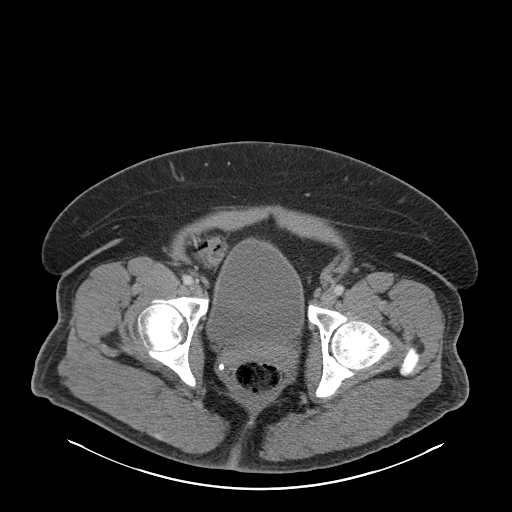
[im 24/101  soft-tissue]
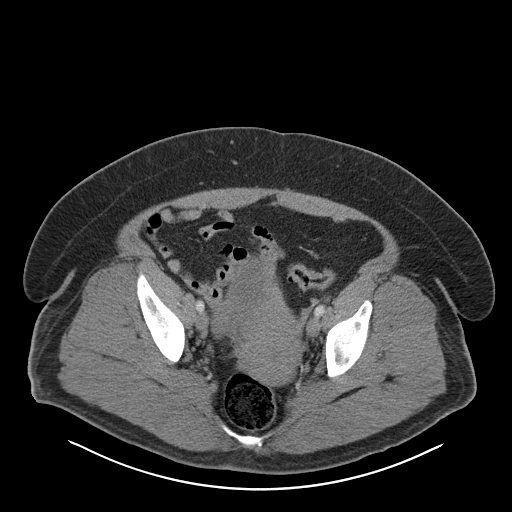
[im 31/101  soft-tissue]
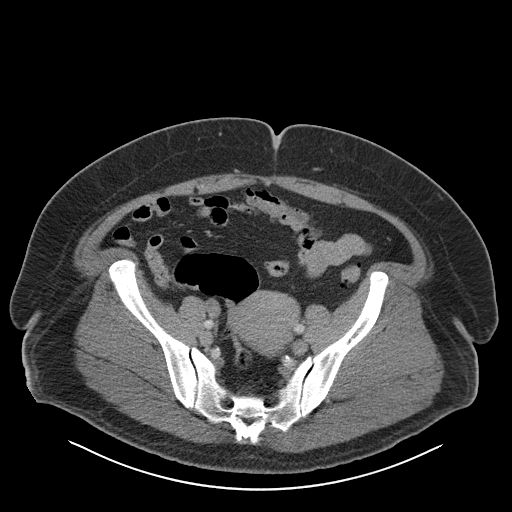
[im 39/101  soft-tissue]
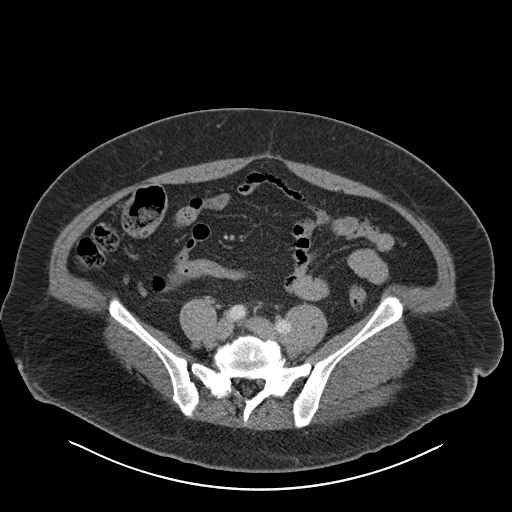
[im 47/101  soft-tissue]
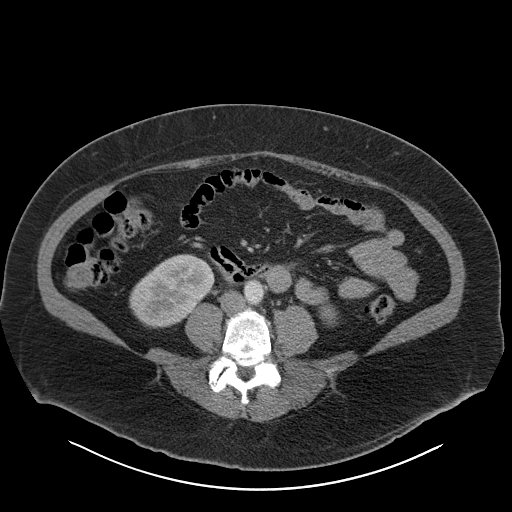
[im 54/101  soft-tissue]
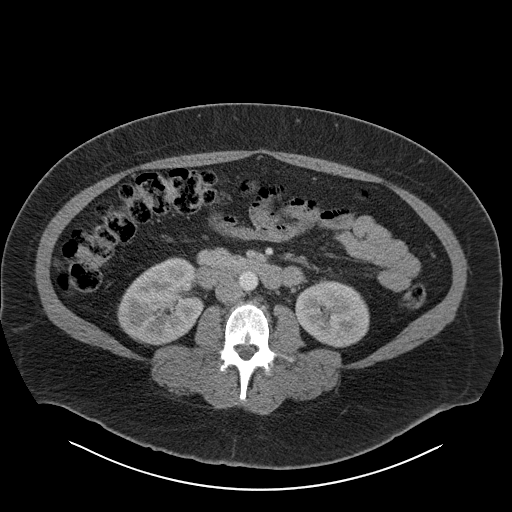
[im 62/101  soft-tissue]
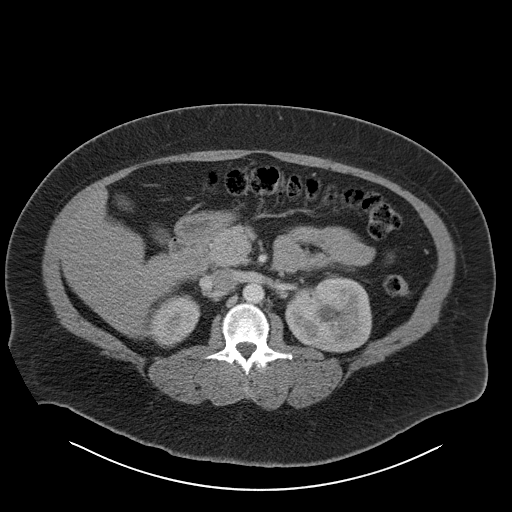
[im 70/101  soft-tissue]
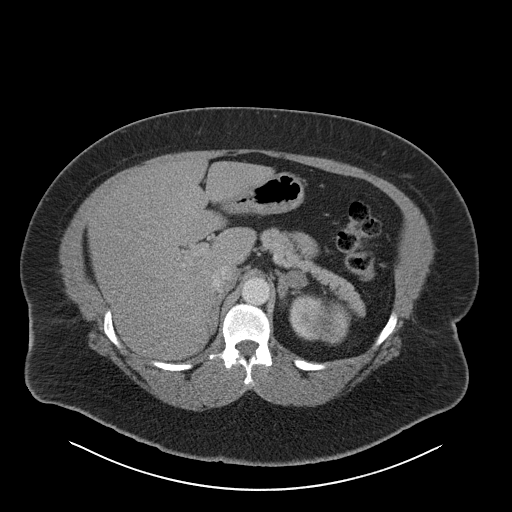
[im 70/101  bone]
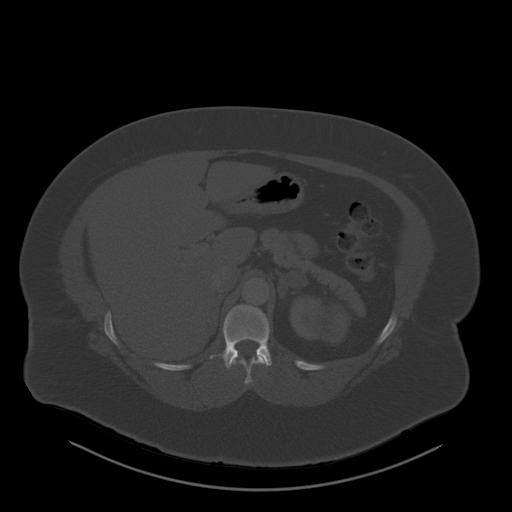
[im 77/101  soft-tissue]
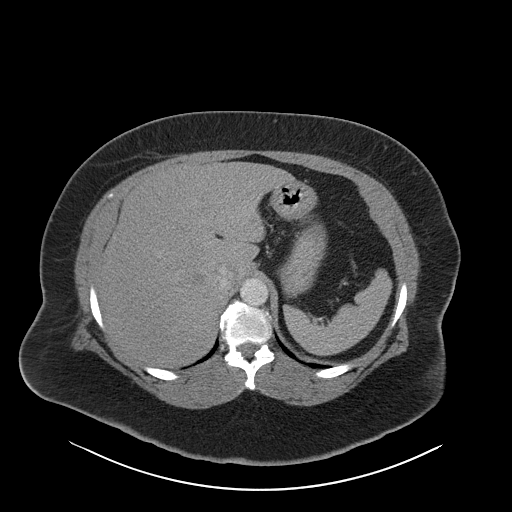
[im 85/101  soft-tissue]
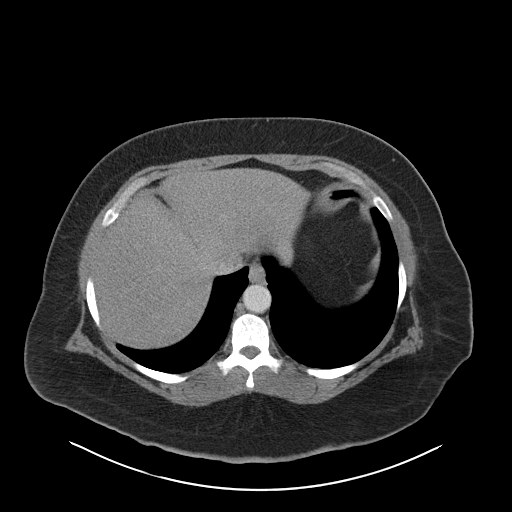
[im 93/101  soft-tissue]
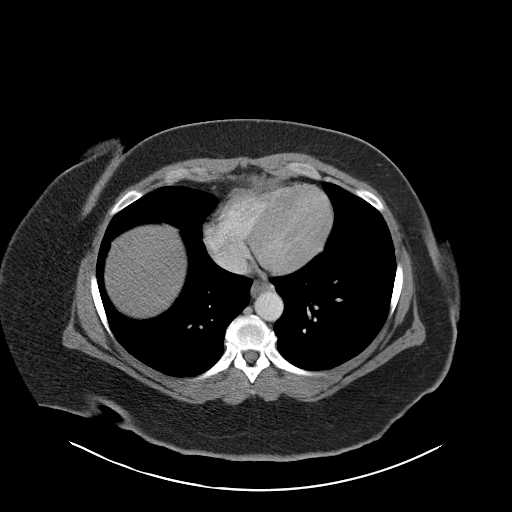

[Series 5: coronal st · coronal · 0.96mm/px · 3 of 180 slices shown]
[im 60/180  soft-tissue]
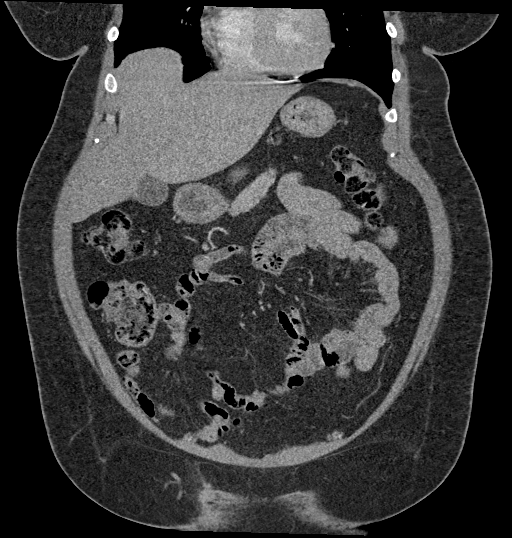
[im 80/180  soft-tissue]
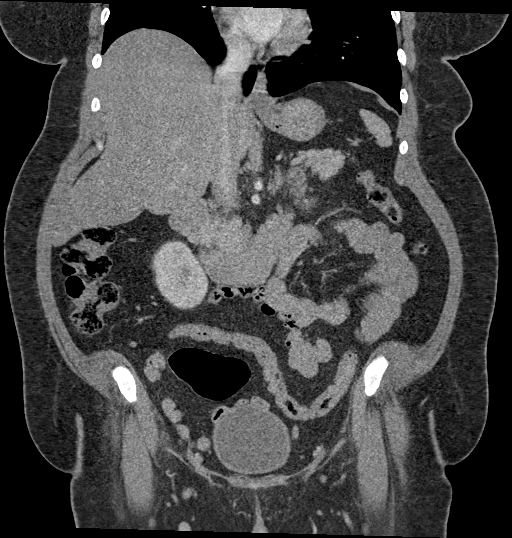
[im 100/180  soft-tissue]
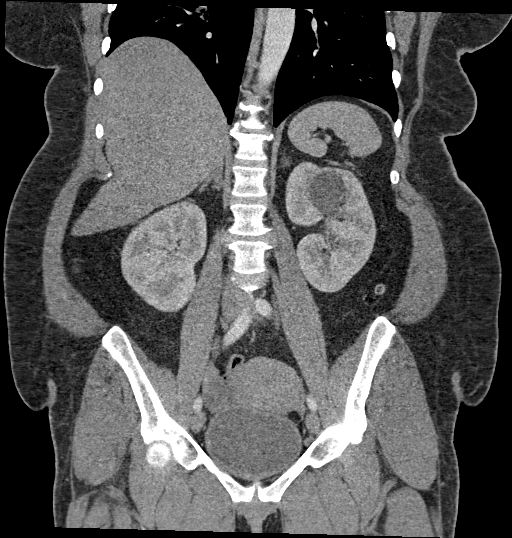

[15 of 46 positions shown; findings below may reference images not displayed]

RADIATION DOSE REDUCTION: This exam was performed according to the
departmental dose-optimization program which includes automated
exposure control, adjustment of the mA and/or kV according to
patient size and/or use of iterative reconstruction technique.

CONTRAST:  100mL OMNIPAQUE IOHEXOL 300 MG/ML  SOLN
FINDINGS: Lower chest:  No contributory findings.

Hepatobiliary: No focal liver abnormality.No evidence of biliary
obstruction or stone.

Pancreas: Unremarkable.

Spleen: Unremarkable.

Adrenals/Urinary Tract: 2 left adrenal nodules measuring 17 and 19
mm. Seventeen mm low-density right adrenal nodule. No chart history
of malignancy. Patchy areas of hypoenhancement in the bilateral
kidney on delayed phase, most apparent at the right upper pole.
Lobulated cyst at the upper pole left kidney with simple appearance,
3.4 cm in diameter. No hydronephrosis or stone. Unremarkable
bladder.

Stomach/Bowel: No obstruction. No appendicitis. Left colonic
diverticulosis.

Vascular/Lymphatic: No acute vascular abnormality. No mass or
adenopathy.

Reproductive:Anterior subserosal uterine mass measuring 3.4 cm,
consistent with fibroid.

Other: No ascites or pneumoperitoneum.

Musculoskeletal: No acute abnormalities. Generalized lumbar disc and
facet degeneration.
IMPRESSION: 1. Heterogeneous enhancement of the kidneys suspicious for
pyelonephritis, please correlate with urinalysis.
2. Bilateral adrenal nodules, usually adenoma. If patient has
history of cancer, recommend further evaluation with adrenal
protocol abdomen CT without and with contrast. If no history of
cancer, consider followup by CT in 12 months.
3. Colonic diverticulosis and uterine fibroid.

## 2022-11-23 ENCOUNTER — Emergency Department (HOSPITAL_COMMUNITY)
Admission: EM | Admit: 2022-11-23 | Discharge: 2022-11-23 | Disposition: A | Discharge status: Home / Self Care | Payer: Medicaid Other | Attending: Emergency Medicine | Admitting: Emergency Medicine

## 2022-11-23 ENCOUNTER — Encounter (HOSPITAL_COMMUNITY): Payer: Self-pay

## 2022-11-23 ENCOUNTER — Other Ambulatory Visit: Payer: Self-pay

## 2022-11-23 DIAGNOSIS — B369 Superficial mycosis, unspecified: Secondary | ICD-10-CM | POA: Diagnosis not present

## 2022-11-23 DIAGNOSIS — F1490 Cocaine use, unspecified, uncomplicated: Secondary | ICD-10-CM | POA: Diagnosis not present

## 2022-11-23 DIAGNOSIS — F1491 Cocaine use, unspecified, in remission: Secondary | ICD-10-CM

## 2022-11-23 DIAGNOSIS — L309 Dermatitis, unspecified: Secondary | ICD-10-CM

## 2022-11-23 DIAGNOSIS — R21 Rash and other nonspecific skin eruption: Secondary | ICD-10-CM | POA: Diagnosis present

## 2022-11-23 MED ORDER — CLOTRIMAZOLE 1 % EX CREA
TOPICAL_CREAM | Freq: Two times a day (BID) | CUTANEOUS | Status: DC
  Filled 2022-11-23: qty 15

## 2022-11-23 MED ORDER — CLOTRIMAZOLE 1 % EX CREA: TOPICAL_CREAM | CUTANEOUS | 0 refills | Status: DC

## 2022-11-23 MED ORDER — FLUCONAZOLE 200 MG PO TABS
200.0000 mg | ORAL_TABLET | Freq: Once | ORAL | Status: AC
  Administered 2022-11-23: 200 mg via ORAL
  Filled 2022-11-23: qty 1

## 2022-11-23 NOTE — ED Provider Notes (Signed)
[place] EMERGENCY DEPARTMENT AT [facility] Provider Note   CSN: [id number] Arrival date & time: 11/23/22  0000     History  Chief Complaint  Patient presents with   Rash   Vaginal Itching    Julie [name] is a 47 y.o. female.  Patient c/o irritation and itching and small bumps to skin from vulvar area extending back towards rectum for past few months. Denies any acute or abrupt worsening. No internal/vaginal pain or discharge. Indicates not sexually active. No other skin lesions/rash. No change in soaps, detergents, or home or personal product use. Has tried to apply vaseline to area but that has not helped.  No fever or chills.   The history is provided by the patient and medical records.  Rash Associated symptoms: no abdominal pain, no diarrhea, no fever, no headaches, no nausea, no shortness of breath, no sore throat and not vomiting   Vaginal Itching Pertinent negatives include no chest pain, no abdominal pain, no headaches and no shortness of breath.       Home Medications Prior to Admission medications   Medication Sig Start Date End Date Taking? Authorizing Provider  acetaminophen (TYLENOL) 500 MG tablet Take 2,000 mg by mouth 2 (two) times daily as needed (pain). Patient not taking: Reported on 07/20/2022    [provider]  doxycycline (VIBRAMYCIN) 100 MG capsule Take 1 capsule (100 mg total) by mouth 2 (two) times daily. Patient not taking: Reported on 03/19/2022 01/30/22   Petrucelli, Lelon Mast R, PA-C  methylPREDNISolone (MEDROL) 4 MG tablet Day 1&2: Take 8 mg (2 tablets) before breakfast; 4 mg (1 tablet) after lunch; 4 mg (1 tablet) after dinner; 8 mg (2 tablets) at bedtime)  Day 3&4: Take 4 mg (1 tablet) before breakfast; 4 mg (1 tablet) after lunch; 4 mg (1 tablet) after dinner; 8 mg (2 tablets) at bedtime Day 5&6: Take 4 mg (1 tablet) before breakfast; 4 mg (1 tablet) after lunch; 4 mg (1 tablet) after dinner; 4 mg (1 tablet) at bedtime Day  7&8: Take 4 mg (1 tablet) before breakfast; 4 mg (1 tablet) after lunch; 4 mg (1 tablet) at bedtime  Day 9&10: Take 4 mg (1 tablet) before breakfast; 4 mg (1 tablet) at bedtime Day 11&12: Take 4 mg (1 tablet) before breakfast 11/11/22   Kommor, Madison, MD  metroNIDAZOLE (FLAGYL) 500 MG tablet Take 1 tablet (500 mg total) by mouth 2 (two) times daily. Patient not taking: Reported on 03/19/2022 01/30/22   Petrucelli, Pleas Koch, PA-C  valACYclovir (VALTREX) 500 MG tablet Take 1 tablet (500 mg total) by mouth 2 (two) times daily. Patient not taking: Reported on 03/19/2022 01/30/22   Petrucelli, Pleas Koch, PA-C      Allergies    Patient has no known allergies.    Review of Systems   Review of Systems  Constitutional:  Negative for fever.  HENT:  Negative for sore throat.   Eyes:  Negative for redness.  Respiratory:  Negative for shortness of breath.   Cardiovascular:  Negative for chest pain.  Gastrointestinal:  Negative for abdominal pain, diarrhea, nausea and vomiting.  Genitourinary:  Negative for dysuria, pelvic pain, vaginal bleeding and vaginal discharge.  Musculoskeletal:  Negative for back pain.  Skin:  Positive for rash.  Neurological:  Negative for headaches.    Physical Exam Updated Vital Signs BP 130/85 (BP Location: Right Arm)   Pulse 99   Temp 98.2 F (36.8 C)   Resp 18   Ht  1.803 m (5\' 11" )   Wt 117.9 kg   LMP  (LMP Unknown)   SpO2 100%   BMI 36.25 kg/m  Physical Exam Vitals and nursing note reviewed.  Constitutional:      Appearance: Normal appearance. She is well-developed.  HENT:     Head: Atraumatic.     Nose: Nose normal.     Mouth/Throat:     Mouth: Mucous membranes are moist.  Eyes:     General: No scleral icterus.    Conjunctiva/sclera: Conjunctivae normal.  Neck:     Trachea: No tracheal deviation.  Cardiovascular:     Rate and Rhythm: Normal rate.     Pulses: Normal pulses.  Pulmonary:     Effort: Pulmonary effort is normal. No respiratory  distress.  Abdominal:     General: There is no distension.     Palpations: Abdomen is soft.     Tenderness: There is no abdominal tenderness.  Genitourinary:    Comments: No cva tenderness.  Musculoskeletal:        General: No swelling.     Cervical back: Neck supple. No muscular tenderness.  Skin:    General: Skin is warm and dry.     Comments: Pt with mild dermatitis to creases of skin in perineal area/intergluteal area. No lice or insects seen. No cellulitis or abscess noted. No vesicular lesions noted.   Neurological:     Mental Status: She is alert.     Comments: Alert, speech normal.   Psychiatric:        Mood and Affect: Mood normal.     ED Results / Procedures / Treatments   Labs (all labs ordered are listed, but only abnormal results are displayed) Labs Reviewed - No data to display  EKG None  Radiology No results found.  Procedures Procedures    Medications Ordered in ED Medications  fluconazole (DIFLUCAN) tablet 200 mg (has no administration in time range)  clotrimazole (LOTRIMIN) 1 % cream (has no administration in time range)    ED Course/ Medical Decision Making/ A&P                             Medical Decision Making Problems Addressed: Dermatitis: acute illness or injury    Details: Acute/chronic Fungal dermatitis: acute illness or injury    Details: Acute/chronic History of cocaine use: chronic illness or injury that poses a threat to life or bodily functions  Amount and/or Complexity of Data Reviewed External Data Reviewed: notes. Labs:  Decision-making details documented in ED Course.  Risk Prescription drug management.   Reviewed nursing notes and prior charts for additional history. Prior evaluation for similar symptoms noted.   Fluconazole po. Clotrimazole cream.   Recent labs reviewed/interpreted by me - rpr and hiv negative.   Rec pcp and/or derm outpatient follow up.   Pt currently appears stable for d/c.            Final Clinical Impression(s) / ED Diagnoses Final diagnoses:  None    Rx / DC Orders ED Discharge Orders     None         Cathren Laine, MD 11/23/22 [phone]

## 2022-11-23 NOTE — ED Triage Notes (Signed)
Pt has bumps on vagina that burn and itch. Pt has been seen before for same and is back bc it is worse.

## 2022-11-23 NOTE — Discharge Instructions (Addendum)
It was our pleasure to provide your ER care today - we hope that you feel better.  Keep area very clean. Wash with mild, non-perfumed, soap 2x/day, and rinse with water, and then dry thoroughly (may use hair dryer on cool setting to help dry area). Then apply thin coat of clotrimazole cream to area.   Follow up with primary care doctor/dermatologist in 1-2 weeks if symptoms fail to improve/resolve.  Avoid drug/cocaine use as it is harmful to your physical health and mental well-being. See resource guide attached in terms of accessing inpatient or outpatient substance use treatment programs.   Return to ER if worse, new symptoms, fevers, chest pain, trouble breathing, or other emergency concern.

## 2022-11-29 ENCOUNTER — Emergency Department (HOSPITAL_COMMUNITY)
Admission: EM | Admit: 2022-11-29 | Discharge: 2022-11-30 | Disposition: A | Discharge status: Home / Self Care | Payer: Medicaid Other | Attending: Emergency Medicine | Admitting: Emergency Medicine

## 2022-11-29 DIAGNOSIS — E1165 Type 2 diabetes mellitus with hyperglycemia: Secondary | ICD-10-CM | POA: Diagnosis not present

## 2022-11-29 DIAGNOSIS — L03315 Cellulitis of perineum: Secondary | ICD-10-CM | POA: Insufficient documentation

## 2022-11-29 DIAGNOSIS — R102 Pelvic and perineal pain: Secondary | ICD-10-CM | POA: Diagnosis present

## 2022-11-29 DIAGNOSIS — Z7984 Long term (current) use of oral hypoglycemic drugs: Secondary | ICD-10-CM | POA: Insufficient documentation

## 2022-11-29 DIAGNOSIS — R000 Tachycardia, unspecified: Secondary | ICD-10-CM | POA: Diagnosis not present

## 2022-11-29 NOTE — ED Triage Notes (Signed)
Pt to ED via EMS from parking lot. Pt is homeless. Pt reports a mass developing x2 between her rectum and vagina. Pt reports swelling to be roughly the size of a golf ball. Pt c/o burning while urinating. Pt reports swelling getting worse to the point that it is covering her vagina. Pt has hx of vaginal lesions.   EMS Vitals: 118 HR 154/90 98% RA 20 RR 484 CBG

## 2022-11-29 NOTE — ED Provider Triage Note (Signed)
Emergency Medicine Provider Triage Evaluation Note  Julie [name] , a 47 y.o. female  was evaluated in triage.  Pt complains of presents with pain around her labia, states that about 3 days ago, has got larger and more painful no drainage or discharge she has endorse burning-like sensation with urination denies any vaginal discharge vaginal bleeding..  Review of Systems  Positive: Dysuria, labia pain Negative: Nausea vomiting  Physical Exam  BP (!) 137/98 (BP Location: Right Arm)   Pulse (!) 123   Temp 99.1 F (37.3 C)   Resp 19   LMP  (LMP Unknown)   SpO2 98%  Gen:   Awake, no distress   Resp:  Normal effort  MSK:   Moves extremities without difficulty  Other:  With chaperone present visualize patient's rectum, there is no noted hemorrhoids no active drainage or discharge, patient's left labia is enlarged and swollen fluctuance present.  Medical Decision Making  Medically screening exam initiated at 11:22 PM.  Appropriate orders placed.  PEYTYN TRINE was informed that the remainder of the evaluation will be completed by another provider, this initial triage assessment does not replace that evaluation, and the importance of remaining in the ED until their evaluation is complete.  Lab work has been ordered patient will need further workup suspect patient likely has a labial abscess versus Barthenon cyst.   Carroll Sage, PA-C 11/30/22 [phone]

## 2022-11-30 ENCOUNTER — Other Ambulatory Visit (HOSPITAL_COMMUNITY): Payer: Medicaid Other

## 2022-11-30 ENCOUNTER — Other Ambulatory Visit: Payer: Self-pay

## 2022-11-30 ENCOUNTER — Emergency Department (HOSPITAL_COMMUNITY): Payer: Medicaid Other

## 2022-11-30 ENCOUNTER — Encounter (HOSPITAL_COMMUNITY): Payer: Self-pay

## 2022-11-30 LAB — URINALYSIS, W/ REFLEX TO CULTURE (INFECTION SUSPECTED)
Bilirubin Urine: NEGATIVE
Glucose, UA: >=500 mg/dL — AB
Ketones, ur: NEGATIVE mg/dL
Nitrite: NEGATIVE
Protein, ur: NEGATIVE mg/dL
Specific Gravity, Urine: >1.046 — ABNORMAL HIGH (ref 1.005–1.030)
WBC, UA: >50 WBC/hpf (ref 0–5)
pH: 5 (ref 5.0–8.0)

## 2022-11-30 LAB — CBC WITH DIFFERENTIAL/PLATELET
Abs Immature Granulocytes: 0.03 10*3/uL (ref 0.00–0.07)
Basophils Absolute: 0 10*3/uL (ref 0.0–0.1)
Basophils Relative: 0 %
Eosinophils Absolute: 0.1 10*3/uL (ref 0.0–0.5)
Eosinophils Relative: 1 %
HCT: 41.5 % (ref 36.0–46.0)
Hemoglobin: 13.2 g/dL (ref 12.0–15.0)
Immature Granulocytes: 0 %
Lymphocytes Relative: 28 %
Lymphs Abs: 3.2 10*3/uL (ref 0.7–4.0)
MCH: 26.5 pg (ref 26.0–34.0)
MCHC: 31.8 g/dL (ref 30.0–36.0)
MCV: 83.2 fL (ref 80.0–100.0)
Monocytes Absolute: 0.9 10*3/uL (ref 0.1–1.0)
Monocytes Relative: 7 %
Neutro Abs: 7.5 10*3/uL (ref 1.7–7.7)
Neutrophils Relative %: 64 %
Platelets: 203 10*3/uL (ref 150–400)
RBC: 4.99 MIL/uL (ref 3.87–5.11)
RDW: 13.2 % (ref 11.5–15.5)
WBC: 11.7 10*3/uL — ABNORMAL HIGH (ref 4.0–10.5)
nRBC: 0 % (ref 0.0–0.2)

## 2022-11-30 LAB — COMPREHENSIVE METABOLIC PANEL
ALT: 16 U/L (ref 0–44)
AST: 16 U/L (ref 15–41)
Albumin: 3.3 g/dL — ABNORMAL LOW (ref 3.5–5.0)
Alkaline Phosphatase: 83 U/L (ref 38–126)
Anion gap: 14 (ref 5–15)
BUN: 6 mg/dL (ref 6–20)
CO2: 22 mmol/L (ref 22–32)
Calcium: 10.4 mg/dL — ABNORMAL HIGH (ref 8.9–10.3)
Chloride: 97 mmol/L — ABNORMAL LOW (ref 98–111)
Creatinine, Ser: 0.87 mg/dL (ref 0.44–1.00)
GFR, Estimated: >60 mL/min (ref >60)
Glucose, Bld: 503 mg/dL (ref 70–99)
Potassium: 3.7 mmol/L (ref 3.5–5.1)
Sodium: 133 mmol/L — ABNORMAL LOW (ref 135–145)
Total Bilirubin: 0.6 mg/dL (ref 0.3–1.2)
Total Protein: 7.5 g/dL (ref 6.5–8.1)

## 2022-11-30 LAB — PREGNANCY, URINE: Preg Test, Ur: NEGATIVE

## 2022-11-30 LAB — LIPASE, BLOOD: Lipase: 33 U/L (ref 11–51)

## 2022-11-30 LAB — I-STAT BETA HCG BLOOD, ED (MC, WL, AP ONLY): I-stat hCG, quantitative: <5 m[IU]/mL (ref <5)

## 2022-11-30 LAB — CBG MONITORING, ED: Glucose-Capillary: 271 mg/dL — ABNORMAL HIGH (ref 70–99)

## 2022-11-30 MED ORDER — METRONIDAZOLE 500 MG/100ML IV SOLN
500.0000 mg | Freq: Once | INTRAVENOUS | Status: AC
  Administered 2022-11-30: 500 mg via INTRAVENOUS
  Filled 2022-11-30: qty 100

## 2022-11-30 MED ORDER — CEFAZOLIN SODIUM-DEXTROSE 1-4 GM/50ML-% IV SOLN
1.0000 g | Freq: Once | INTRAVENOUS | Status: AC
  Administered 2022-11-30: 1 g via INTRAVENOUS
  Filled 2022-11-30: qty 50

## 2022-11-30 MED ORDER — METRONIDAZOLE 500 MG PO TABS: 500.0000 mg | ORAL_TABLET | Freq: Two times a day (BID) | ORAL | 0 refills | Status: DC

## 2022-11-30 MED ORDER — METFORMIN HCL 500 MG PO TABS: 500.0000 mg | ORAL_TABLET | Freq: Two times a day (BID) | ORAL | 0 refills | Status: DC

## 2022-11-30 MED ORDER — CEFDINIR 300 MG PO CAPS: 300.0000 mg | ORAL_CAPSULE | Freq: Two times a day (BID) | ORAL | 0 refills | Status: DC

## 2022-11-30 MED ORDER — IOHEXOL 350 MG/ML SOLN
75.0000 mL | Freq: Once | INTRAVENOUS | Status: AC | PRN
  Administered 2022-11-30: 75 mL via INTRAVENOUS

## 2022-11-30 MED ORDER — SODIUM CHLORIDE 0.9 % IV BOLUS
1000.0000 mL | Freq: Once | INTRAVENOUS | Status: AC
  Administered 2022-11-30: 1000 mL via INTRAVENOUS

## 2022-11-30 NOTE — ED Notes (Addendum)
Lab called critical glucose 503 at this time. Notified Dr. Pecola Leisure.

## 2022-11-30 NOTE — ED Provider Notes (Signed)
[place] EMERGENCY DEPARTMENT AT [facility] Provider Note   CSN: [id number] Arrival date & time: 11/29/22  2255     History  Chief Complaint  Patient presents with   Groin Swelling    Julie [name] is a 47 y.o. female.  The history is provided by the patient.  Julie [name] is a 47 y.o. female who presents to the Emergency Department complaining of labial pain and swelling for the last several days.  No reported fevers she does not have the means to check her temperature at home.  No nausea, vomiting, diarrhea.  She does have urinary frequency and dysuria.  She is not sexually active. She has a history of diabetes and takes metformin.  She does use cocaine, last use 2 days ago.  No IV drug use.  Home Medications Prior to Admission medications   Medication Sig Start Date End Date Taking? Authorizing Provider  cefdinir (OMNICEF) 300 MG capsule Take 1 capsule (300 mg total) by mouth 2 (two) times daily. 11/30/22  Yes Tilden Fossa, MD  metFORMIN (GLUCOPHAGE) 500 MG tablet Take 1 tablet (500 mg total) by mouth 2 (two) times daily with a meal. 11/30/22  Yes Tilden Fossa, MD  metroNIDAZOLE (FLAGYL) 500 MG tablet Take 1 tablet (500 mg total) by mouth 2 (two) times daily. 11/30/22  Yes Tilden Fossa, MD  acetaminophen (TYLENOL) 500 MG tablet Take 2,000 mg by mouth 2 (two) times daily as needed (pain). Patient not taking: Reported on 07/20/2022    [provider]  clotrimazole (LOTRIMIN) 1 % cream Apply to affected area 2 times daily for one week 11/23/22   Cathren Laine, MD  doxycycline (VIBRAMYCIN) 100 MG capsule Take 1 capsule (100 mg total) by mouth 2 (two) times daily. Patient not taking: Reported on 03/19/2022 01/30/22   Petrucelli, Lelon Mast R, PA-C  methylPREDNISolone (MEDROL) 4 MG tablet Day 1&2: Take 8 mg (2 tablets) before breakfast; 4 mg (1 tablet) after lunch; 4 mg (1 tablet) after dinner; 8 mg (2 tablets) at bedtime)  Day 3&4: Take 4 mg (1 tablet)  before breakfast; 4 mg (1 tablet) after lunch; 4 mg (1 tablet) after dinner; 8 mg (2 tablets) at bedtime Day 5&6: Take 4 mg (1 tablet) before breakfast; 4 mg (1 tablet) after lunch; 4 mg (1 tablet) after dinner; 4 mg (1 tablet) at bedtime Day 7&8: Take 4 mg (1 tablet) before breakfast; 4 mg (1 tablet) after lunch; 4 mg (1 tablet) at bedtime  Day 9&10: Take 4 mg (1 tablet) before breakfast; 4 mg (1 tablet) at bedtime Day 11&12: Take 4 mg (1 tablet) before breakfast 11/11/22   Kommor, Madison, MD  valACYclovir (VALTREX) 500 MG tablet Take 1 tablet (500 mg total) by mouth 2 (two) times daily. Patient not taking: Reported on 03/19/2022 01/30/22   Petrucelli, Pleas Koch, PA-C      Allergies    Patient has no known allergies.    Review of Systems   Review of Systems  All other systems reviewed and are negative.   Physical Exam Updated Vital Signs BP 117/65   Pulse 94   Temp 98.3 F (36.8 C) (Oral)   Resp 17   Ht 5\' 11"  (1.803 m)   Wt 120.2 kg   LMP  (LMP Unknown)   SpO2 95%   BMI 36.96 kg/m  Physical Exam Vitals and nursing note reviewed.  Constitutional:      Appearance: She is well-developed.  HENT:     Head:  Normocephalic and atraumatic.  Cardiovascular:     Rate and Rhythm: Regular rhythm. Tachycardia present.     Heart sounds: No murmur heard. Pulmonary:     Effort: Pulmonary effort is normal. No respiratory distress.     Breath sounds: Normal breath sounds.  Abdominal:     Palpations: Abdomen is soft.     Tenderness: There is no guarding or rebound.     Comments: Moderate left-sided abdominal tenderness  Genitourinary:    Comments: Soft tissue swelling and tenderness to the left labia without any focal fluctuance or fluid collection. Musculoskeletal:        General: No tenderness.  Skin:    General: Skin is warm and dry.  Neurological:     Mental Status: She is alert and oriented to person, place, and time.  Psychiatric:        Behavior: Behavior normal.     ED  Results / Procedures / Treatments   Labs (all labs ordered are listed, but only abnormal results are displayed) Labs Reviewed  COMPREHENSIVE METABOLIC PANEL - Abnormal; Notable for the following components:      Result Value   Sodium 133 (*)    Chloride 97 (*)    Glucose, Bld 503 (*)    Calcium 10.4 (*)    Albumin 3.3 (*)    All other components within normal limits  CBC WITH DIFFERENTIAL/PLATELET - Abnormal; Notable for the following components:   WBC 11.7 (*)    All other components within normal limits  CBG MONITORING, ED - Abnormal; Notable for the following components:   Glucose-Capillary 271 (*)    All other components within normal limits  LIPASE, BLOOD  CBC WITH DIFFERENTIAL/PLATELET  PREGNANCY, URINE  URINALYSIS, W/ REFLEX TO CULTURE (INFECTION SUSPECTED)  I-STAT BETA HCG BLOOD, ED ([facility], [facility], [facility] ONLY)    EKG None  Radiology CT ABDOMEN PELVIS W CONTRAST  Result Date: 11/30/2022 CLINICAL DATA:  47 year old female with history of nonlocalized left-sided abdominal pain and labial pain. Possible mass developing between rectum and vagina. Burning during urination. EXAM: CT ABDOMEN AND PELVIS WITH CONTRAST TECHNIQUE: Multidetector CT imaging of the abdomen and pelvis was performed using the standard protocol following bolus administration of intravenous contrast. RADIATION DOSE REDUCTION: This exam was performed according to the departmental dose-optimization program which includes automated exposure control, adjustment of the mA and/or kV according to patient size and/or use of iterative reconstruction technique. CONTRAST:  75mL OMNIPAQUE IOHEXOL 350 MG/ML SOLN COMPARISON:  CT of the abdomen and pelvis 10/31/2021. FINDINGS: Lower chest: Scattered areas of mild post infectious or inflammatory scarring in the lung bases, most severe in the right middle lobe. Hepatobiliary: No suspicious cystic or solid hepatic lesions. No intra or extrahepatic biliary ductal dilatation. Gallbladder is  unremarkable in appearance. Pancreas: No pancreatic mass. No pancreatic ductal dilatation. No pancreatic or peripancreatic fluid collections or inflammatory changes. Spleen: Unremarkable. Adrenals/Urinary Tract: 3.5 cm low-attenuation lesion in the upper pole of the left kidney, similar to the prior study, compatible with a simple cyst (Bosniak class 1, no imaging follow-up recommended). Right kidney is unremarkable in appearance. Bilateral adrenal nodules, similar to the prior study, measuring 1.9 x 1.6 cm on the right and 2.0 x 1.7 cm on the left, incompletely characterize, but presumably benign lesions such as adenomas (no imaging follow-up recommended). No hydroureteronephrosis. Urinary bladder is completely decompressed. Stomach/Bowel: The appearance of the stomach is normal. There is no pathologic dilatation of small bowel or colon. Multiple colonic diverticula are noted,  without surrounding inflammatory changes to indicate an acute diverticulitis at this time. Normal appendix. Vascular/Lymphatic: Atherosclerotic calcifications are noted in the pelvic vasculature. No aneurysm or dissection noted in the abdominal or pelvic vasculature. Mildly enlarged left inguinal lymph nodes measuring up to 1.2 cm in short axis (axial image 90 of series 3). Mildly enlarged left obturator lymph node (axial image 84 of series 3) measuring 1.4 cm in short axis. No abdominal lymphadenopathy. Reproductive: Contour abnormality in the anterior aspect of the fundus of the uterus measuring 2 cm in diameter, presumably a small fibroid. Ovaries are unremarkable in appearance. Other: No significant volume of ascites.  No pneumoperitoneum. Musculoskeletal: Poorly defined low-intermediate attenuation and soft tissue stranding in the left labia best appreciated on axial image 109 of series 3 and coronal image 81 of series 6, without well-defined organized rim enhancing fluid collection or definite enhancing mass. There are no aggressive  appearing lytic or blastic lesions noted in the visualized portions of the skeleton. IMPRESSION: 1. Findings, as above, suggest phlegmon in the left labia, without clearly definable abscess or soft tissue mass. Some mild lymph node enlargement in the left inguinal region and left obturator nodal station is favored to be reactive, although correlation with physical examination is recommended to exclude the possibility of neoplasm. 2. No other acute findings noted elsewhere in the abdomen or pelvis. 3. Colonic diverticulosis without evidence of acute diverticulitis at this time. 4. Additional incidental findings, as above. Electronically Signed   By: Trudie Reed M.D.   On: 11/30/2022 06:56    Procedures Procedures    Medications Ordered in ED Medications  metroNIDAZOLE (FLAGYL) IVPB 500 mg (has no administration in time range)  ceFAZolin (ANCEF) IVPB 1 g/50 mL premix (1 g Intravenous New Bag/Given 11/30/22 0716)  sodium chloride 0.9 % bolus 1,000 mL (0 mLs Intravenous Stopped 11/30/22 0529)  sodium chloride 0.9 % bolus 1,000 mL (0 mLs Intravenous Stopped 11/30/22 0719)  iohexol (OMNIPAQUE) 350 MG/ML injection 75 mL (75 mLs Intravenous Contrast Given 11/30/22 0620)    ED Course/ Medical Decision Making/ A&P                             Medical Decision Making Amount and/or Complexity of Data Reviewed Labs: ordered. Radiology: ordered.  Risk Prescription drug management.   Patient with history of diabetes here for evaluation of left labial swelling and pain.  She does have soft tissue swelling on examination without drainable fluid collection.  She is not sexually active, doubt acute STI.  Given her tachycardia and abdominal tenderness labs and a CT abdomen pelvis were obtained.  Labs are significant for hyperglycemia, no evidence of DKA.  She was treated with IV fluid hydration for her hyperglycemia.  Patient does state that she has not been taking her metformin and has been out of that for  a while.  Will represcribe.  CT does not demonstrate any evidence of acute intra-abdominal process.  She does have labial cellulitis without any evidence of abscess.  Will start on antibiotics for cellulitis.  Patient care transferred pending CBC.        Final Clinical Impression(s) / ED Diagnoses Final diagnoses:  Cellulitis of perineum  Hyperglycemia due to diabetes mellitus (HCC)    Rx / DC Orders ED Discharge Orders          Ordered    metroNIDAZOLE (FLAGYL) 500 MG tablet  2 times daily  11/30/22 0710    cefdinir (OMNICEF) 300 MG capsule  2 times daily        11/30/22 0710    metFORMIN (GLUCOPHAGE) 500 MG tablet  2 times daily with meals        11/30/22 0710              Tilden Fossa, MD 11/30/22 0730

## 2022-11-30 NOTE — Discharge Planning (Signed)
RNCM consulted regarding PCP establishment. Pt presented to [facility] ED today with groin swelling. RNCM met with pt at bedside; pt confirms not having access to follow up care with PCP or insurance coverage. Discussed with patient importance and benefits of establishing PCP, and not utilizing the ED for primary care needs. Pt verbalized understanding and is in agreement. Discussed other options, provided list of local PCPs. RNCM obtained appointment on (12/14/22), time (3:45) with Dr. Aundria Rud and placed on After Visit Summary paperwork.  No further case management needs communicated at this time. Meryl Hubers J. Lucretia Roers, RN, BSN, [place] [phone]

## 2022-11-30 NOTE — ED Provider Notes (Signed)
7:18 AM Care assumed from Dr. Madilyn Hook.  At time of transfer of care, patient awaiting CBC to look for concerning abnormality prior to likely discharge with outpatient antibiotics for groin cellulitis that was seen on imaging.  CT scan did not show convincing abscess or surgical problem at this time.  Plan of care be to discharge home if CBC does not show significant abnormality.   Julie [name], Canary Brim, MD 11/30/22 1352

## 2022-12-01 ENCOUNTER — Emergency Department (HOSPITAL_COMMUNITY)
Admission: EM | Admit: 2022-12-01 | Discharge: 2022-12-01 | Disposition: A | Discharge status: Home / Self Care | Payer: Medicaid Other | Attending: Emergency Medicine | Admitting: Emergency Medicine

## 2022-12-01 ENCOUNTER — Other Ambulatory Visit (HOSPITAL_COMMUNITY): Payer: Self-pay

## 2022-12-01 ENCOUNTER — Encounter (HOSPITAL_COMMUNITY): Payer: Self-pay

## 2022-12-01 DIAGNOSIS — L03315 Cellulitis of perineum: Secondary | ICD-10-CM | POA: Insufficient documentation

## 2022-12-01 DIAGNOSIS — R102 Pelvic and perineal pain: Secondary | ICD-10-CM | POA: Diagnosis present

## 2022-12-01 LAB — URINE CULTURE

## 2022-12-01 MED ORDER — METFORMIN HCL 500 MG PO TABS
500.0000 mg | ORAL_TABLET | Freq: Two times a day (BID) | ORAL | 0 refills | Status: DC
  Filled 2022-12-01: qty 60, 30d supply, fill #0

## 2022-12-01 MED ORDER — METFORMIN HCL 500 MG PO TABS: 500.0000 mg | ORAL_TABLET | Freq: Two times a day (BID) | ORAL | 0 refills | Status: DC

## 2022-12-01 MED ORDER — CEFDINIR 300 MG PO CAPS
300.0000 mg | ORAL_CAPSULE | Freq: Two times a day (BID) | ORAL | 0 refills | Status: DC
  Filled 2022-12-01: qty 14, 7d supply, fill #0

## 2022-12-01 MED ORDER — CEFDINIR 300 MG PO CAPS: 300.0000 mg | ORAL_CAPSULE | Freq: Two times a day (BID) | ORAL | 0 refills | Status: DC

## 2022-12-01 MED ORDER — METRONIDAZOLE 500 MG PO TABS
500.0000 mg | ORAL_TABLET | Freq: Two times a day (BID) | ORAL | 0 refills | Status: DC
  Filled 2022-12-01: qty 14, 7d supply, fill #0

## 2022-12-01 MED ORDER — METRONIDAZOLE 500 MG PO TABS: 500.0000 mg | ORAL_TABLET | Freq: Two times a day (BID) | ORAL | 0 refills | Status: DC

## 2022-12-01 NOTE — Progress Notes (Signed)
Transition of Care [facility]) - Emergency Department Mini Assessment   Patient Details  Name: Julie [name] MRN: [id number] Date of Birth: 1976/03/12  [facility] CM/SW Contact:    Oletta Cohn, RN Phone Number: 12/01/2022, 9:57 AM   Clinical Narrative: RNCM consulted in regards to medication assistance.  Pt has Medicaid insurance coverage and is not eligible for Medication Assistance Through [facility]) program.  RNCM advises to send Rx to [facility] to fill and bring to pt prior to discharge. No further CM needs communicated at this time.     ED Mini Assessment: What brought you to the Emergency Department? : (P) Groin Swelling  Barriers to Discharge: (P) No Barriers Identified     Means of departure: (P) Car  Interventions which prevented an admission or readmission: Medication Review    Patient Contact and Communications        ,          Patient states their goals for this hospitalization and ongoing recovery are:: (P) get something for this pain      Admission diagnosis:  Vaginal abscess Patient Active Problem List   Diagnosis Date Noted   Trichimoniasis 03/26/2019   Urinary frequency 03/21/2019   Obesity 03/21/2019   Vaginal lesion 03/21/2019   Cocaine use disorder, severe, dependence (HCC) 12/12/2016   Cocaine abuse with cocaine-induced psychotic disorder, 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 [place] [address] Phone: [phone] Fax: [phone]

## 2022-12-01 NOTE — ED Provider Notes (Signed)
[place] EMERGENCY DEPARTMENT AT [facility] Provider Note   CSN: [id number] Arrival date & time: 12/01/22  8657     History  Chief Complaint  Patient presents with   Abscess    Julie [name] is a 47 y.o. female.  Patient with history of cocaine abuse, homelessness, asthma presents today with complaints of vaginal pain. She was seen for same yesterday and had CT imaging that did not show a drainable collection. She was discharged with antibiotics. States that she has not been able to fill her antibiotics as she does not have any money and is homeless. States that her pain has been persistent and unchanged. Also endorses dysuria, had prescription sent in for UTI yesterday as well which she has not been able to fill either. Denies abdominal pain, nausea, vomiting, diarrhea. No vaginal discharge, she is not sexually active.   The history is provided by the patient. No language interpreter was used.  Abscess      Home Medications Prior to Admission medications   Medication Sig Start Date End Date Taking? Authorizing Provider  acetaminophen (TYLENOL) 500 MG tablet Take 2,000 mg by mouth 2 (two) times daily as needed (pain). Patient not taking: Reported on 07/20/2022    [provider]  cefdinir (OMNICEF) 300 MG capsule Take 1 capsule (300 mg total) by mouth 2 (two) times daily. 12/01/22   Anis Cinelli, Shawn Route, PA-C  clotrimazole (LOTRIMIN) 1 % cream Apply to affected area 2 times daily for one week 11/23/22   Cathren Laine, MD  metFORMIN (GLUCOPHAGE) 500 MG tablet Take 1 tablet (500 mg total) by mouth 2 (two) times daily with a meal. 12/01/22   Elchonon Maxson A, PA-C  methylPREDNISolone (MEDROL) 4 MG tablet Day 1&2: Take 8 mg (2 tablets) before breakfast; 4 mg (1 tablet) after lunch; 4 mg (1 tablet) after dinner; 8 mg (2 tablets) at bedtime)  Day 3&4: Take 4 mg (1 tablet) before breakfast; 4 mg (1 tablet) after lunch; 4 mg (1 tablet) after dinner; 8 mg (2 tablets) at bedtime  Day 5&6: Take 4 mg (1 tablet) before breakfast; 4 mg (1 tablet) after lunch; 4 mg (1 tablet) after dinner; 4 mg (1 tablet) at bedtime Day 7&8: Take 4 mg (1 tablet) before breakfast; 4 mg (1 tablet) after lunch; 4 mg (1 tablet) at bedtime  Day 9&10: Take 4 mg (1 tablet) before breakfast; 4 mg (1 tablet) at bedtime Day 11&12: Take 4 mg (1 tablet) before breakfast 11/11/22   Kommor, Madison, MD  metroNIDAZOLE (FLAGYL) 500 MG tablet Take 1 tablet (500 mg total) by mouth 2 (two) times daily. 12/01/22   Briceyda Abdullah, Shawn Route, PA-C  valACYclovir (VALTREX) 500 MG tablet Take 1 tablet (500 mg total) by mouth 2 (two) times daily. Patient not taking: Reported on 03/19/2022 01/30/22   Petrucelli, Pleas Koch, PA-C      Allergies    Patient has no known allergies.    Review of Systems   Review of Systems  Skin:  Positive for wound.  All other systems reviewed and are negative.   Physical Exam Updated Vital Signs BP (!) 131/101 (BP Location: Right Arm)   Pulse (!) 107   Temp 98.3 F (36.8 C) (Oral)   Resp 15   Ht 5\' 11"  (1.803 m)   Wt 120 kg   LMP  (LMP Unknown)   SpO2 100%   BMI 36.90 kg/m  Physical Exam Vitals and nursing note reviewed. Exam conducted with a chaperone  present.  Constitutional:      General: She is not in acute distress.    Appearance: Normal appearance. She is normal weight. She is not ill-appearing, toxic-appearing or diaphoretic.  HENT:     Head: Normocephalic and atraumatic.  Cardiovascular:     Rate and Rhythm: Normal rate.  Pulmonary:     Effort: Pulmonary effort is normal. No respiratory distress.  Abdominal:     General: Abdomen is flat.     Palpations: Abdomen is soft.     Tenderness: There is no abdominal tenderness.  Genitourinary:    Comments: Soft tissue swelling and tenderness to palpation of the left labia without distinct fluctuance, induration or fluid collection. No drainage. No crepitus Musculoskeletal:        General: Normal range of motion.     Cervical  back: Normal range of motion.  Skin:    General: Skin is warm and dry.  Neurological:     General: No focal deficit present.     Mental Status: She is alert.  Psychiatric:        Mood and Affect: Mood normal.        Behavior: Behavior normal.     ED Results / Procedures / Treatments   Labs (all labs ordered are listed, but only abnormal results are displayed) Labs Reviewed - No data to display  EKG None  Radiology CT ABDOMEN PELVIS W CONTRAST  Result Date: 11/30/2022 CLINICAL DATA:  47 year old female with history of nonlocalized left-sided abdominal pain and labial pain. Possible mass developing between rectum and vagina. Burning during urination. EXAM: CT ABDOMEN AND PELVIS WITH CONTRAST TECHNIQUE: Multidetector CT imaging of the abdomen and pelvis was performed using the standard protocol following bolus administration of intravenous contrast. RADIATION DOSE REDUCTION: This exam was performed according to the departmental dose-optimization program which includes automated exposure control, adjustment of the mA and/or kV according to patient size and/or use of iterative reconstruction technique. CONTRAST:  75mL OMNIPAQUE IOHEXOL 350 MG/ML SOLN COMPARISON:  CT of the abdomen and pelvis 10/31/2021. FINDINGS: Lower chest: Scattered areas of mild post infectious or inflammatory scarring in the lung bases, most severe in the right middle lobe. Hepatobiliary: No suspicious cystic or solid hepatic lesions. No intra or extrahepatic biliary ductal dilatation. Gallbladder is unremarkable in appearance. Pancreas: No pancreatic mass. No pancreatic ductal dilatation. No pancreatic or peripancreatic fluid collections or inflammatory changes. Spleen: Unremarkable. Adrenals/Urinary Tract: 3.5 cm low-attenuation lesion in the upper pole of the left kidney, similar to the prior study, compatible with a simple cyst (Bosniak class 1, no imaging follow-up recommended). Right kidney is unremarkable in appearance.  Bilateral adrenal nodules, similar to the prior study, measuring 1.9 x 1.6 cm on the right and 2.0 x 1.7 cm on the left, incompletely characterize, but presumably benign lesions such as adenomas (no imaging follow-up recommended). No hydroureteronephrosis. Urinary bladder is completely decompressed. Stomach/Bowel: The appearance of the stomach is normal. There is no pathologic dilatation of small bowel or colon. Multiple colonic diverticula are noted, without surrounding inflammatory changes to indicate an acute diverticulitis at this time. Normal appendix. Vascular/Lymphatic: Atherosclerotic calcifications are noted in the pelvic vasculature. No aneurysm or dissection noted in the abdominal or pelvic vasculature. Mildly enlarged left inguinal lymph nodes measuring up to 1.2 cm in short axis (axial image 90 of series 3). Mildly enlarged left obturator lymph node (axial image 84 of series 3) measuring 1.4 cm in short axis. No abdominal lymphadenopathy. Reproductive: Contour abnormality in the anterior aspect  of the fundus of the uterus measuring 2 cm in diameter, presumably a small fibroid. Ovaries are unremarkable in appearance. Other: No significant volume of ascites.  No pneumoperitoneum. Musculoskeletal: Poorly defined low-intermediate attenuation and soft tissue stranding in the left labia best appreciated on axial image 109 of series 3 and coronal image 81 of series 6, without well-defined organized rim enhancing fluid collection or definite enhancing mass. There are no aggressive appearing lytic or blastic lesions noted in the visualized portions of the skeleton. IMPRESSION: 1. Findings, as above, suggest phlegmon in the left labia, without clearly definable abscess or soft tissue mass. Some mild lymph node enlargement in the left inguinal region and left obturator nodal station is favored to be reactive, although correlation with physical examination is recommended to exclude the possibility of neoplasm. 2.  No other acute findings noted elsewhere in the abdomen or pelvis. 3. Colonic diverticulosis without evidence of acute diverticulitis at this time. 4. Additional incidental findings, as above. Electronically Signed   By: Trudie Reed M.D.   On: 11/30/2022 06:56    Procedures Procedures    Medications Ordered in ED Medications - No data to display  ED Course/ Medical Decision Making/ A&P                             Medical Decision Making Risk Prescription drug management.   This patient is a 47 y.o. female  who presents to the ED for concern of vaginal pain, dysuria.   Differential diagnoses prior to evaluation: The emergent differential diagnosis includes, but is not limited to,  abscess, cellulitis, UTI, pyelonephritis, sepsis . This is not an exhaustive differential.   Past Medical History / Co-morbidities / Social History: Patient is homeless, no pcp, hx substance abuse  Additional history: Chart reviewed. Pertinent results include: Seen here yesterday, had CT imaging that did not show any drainable fluid collection. UA infectious. D/ced with Flagyl and cefdinir  Physical Exam: Physical exam performed. The pertinent findings include: TTP left labia without obvious fluid collection.  Abdomen soft and nontender.   Disposition: After consideration of the diagnostic results and the patients response to treatment, I feel that emergency department workup does not suggest an emergent condition requiring admission or immediate intervention beyond what has been performed at this time. The plan is: discharge with return precautions.  Patient is afebrile, nontoxic-appearing, and in no acute distress with reassuring vital signs.  She had full workup yesterday that was reassuring.  Given that she has had several recent visits with similar complaints, I did offer to incise and drain the area which she declined. Risks vs benefits of same were discussed and patient expressed understanding  with same.  I talked to social work and they were able to get her antibiotics brought to her in the ED today at no charge.  I have emphasized the importance of taking all of these medications as prescribed in their entirety. Evaluation and diagnostic testing in the emergency department does not suggest an emergent condition requiring admission or immediate intervention beyond what has been performed at this time.  Plan for discharge with close PCP follow-up.  Patient is understanding and amenable with plan, educated on red flag symptoms that would prompt immediate return.  Patient discharged in stable condition.   Final Clinical Impression(s) / ED Diagnoses Final diagnoses:  Cellulitis of perineum    Rx / DC Orders ED Discharge Orders  Ordered    cefdinir (OMNICEF) 300 MG capsule  2 times daily        12/01/22 1048    metFORMIN (GLUCOPHAGE) 500 MG tablet  2 times daily with meals        12/01/22 1048    metroNIDAZOLE (FLAGYL) 500 MG tablet  2 times daily        12/01/22 1048          An After Visit Summary was printed and given to the patient.     Vear Clock 12/01/22 1102    Wynetta Fines, MD 12/01/22 [phone]

## 2022-12-01 NOTE — Discharge Instructions (Signed)
As we discussed, I did offer to drain the infected area in your vagina today.  He declined this procedure and would prefer to try antibiotics instead.  We have given you your prescriptions in the emergency department today and it is extremely important you take these medications as prescribed in their entirety for management of your symptoms.  If in the next few days your symptoms do not improve or get worse you will need to return and have the area opened and drained.  Return if development of any new or worsening symptoms.

## 2022-12-01 NOTE — ED Triage Notes (Addendum)
Vaginal abscess x 5 days. No drainage per pt. Seen here on 6/10 for same complaints. No fever/chills. Reports burning on urination.

## 2022-12-02 ENCOUNTER — Encounter (HOSPITAL_COMMUNITY): Payer: Self-pay | Admitting: Emergency Medicine

## 2022-12-02 ENCOUNTER — Other Ambulatory Visit: Payer: Self-pay

## 2022-12-02 ENCOUNTER — Emergency Department (HOSPITAL_COMMUNITY)
Admission: EM | Admit: 2022-12-02 | Discharge: 2022-12-02 | Disposition: A | Discharge status: Home / Self Care | Payer: Medicaid Other | Attending: Emergency Medicine | Admitting: Emergency Medicine

## 2022-12-02 DIAGNOSIS — E119 Type 2 diabetes mellitus without complications: Secondary | ICD-10-CM | POA: Insufficient documentation

## 2022-12-02 DIAGNOSIS — J45909 Unspecified asthma, uncomplicated: Secondary | ICD-10-CM | POA: Insufficient documentation

## 2022-12-02 DIAGNOSIS — N764 Abscess of vulva: Secondary | ICD-10-CM | POA: Insufficient documentation

## 2022-12-02 DIAGNOSIS — Z7984 Long term (current) use of oral hypoglycemic drugs: Secondary | ICD-10-CM | POA: Insufficient documentation

## 2022-12-02 NOTE — ED Provider Notes (Signed)
[place] EMERGENCY DEPARTMENT AT [facility] Provider Note   CSN: [id number] Arrival date & time: 12/02/22  4098     History  Chief Complaint  Patient presents with   Abscess    Julie [name] is a 47 y.o. female.  Julie [name] is a 47 y.o. female with history of diabetes, asthma and mental health problems who presents to the emergency department via EMS from the [facility] for evaluation of an abscess on her labia.  She reports that overnight the abscess ruptured and began draining.  She describes drainage as appearing like pus and blood. Patient has been seen for this twice recently in the ED, yesterday and 2 days prior.  At that time imaging was obtained which did not show a drainable fluid collection, this was thought to likely be cellulitis and patient was treated with Keflex and Flagyl.  She was initially unable to obtain antibiotics, but has since been able to get them and has been taking them.  She was offered incision and drainage yesterday but declined.  Patient denies any fevers or chills, no nausea or vomiting.  She primarily wanted to have the area checked out again now that it is draining.  The history is provided by the patient.  Abscess Associated symptoms: no fever, no nausea and no vomiting        Home Medications Prior to Admission medications   Medication Sig Start Date End Date Taking? Authorizing Provider  acetaminophen (TYLENOL) 500 MG tablet Take 2,000 mg by mouth 2 (two) times daily as needed (pain). Patient not taking: Reported on 07/20/2022    [provider]  cefdinir (OMNICEF) 300 MG capsule Take 1 capsule (300 mg total) by mouth 2 (two) times daily. 12/01/22   Smoot, Shawn Route, PA-C  clotrimazole (LOTRIMIN) 1 % cream Apply to affected area 2 times daily for one week 11/23/22   Cathren Laine, MD  metFORMIN (GLUCOPHAGE) 500 MG tablet Take 1 tablet (500 mg total) by mouth 2 (two) times daily with a meal. 12/01/22   Smoot, Sarah A, PA-C   methylPREDNISolone (MEDROL) 4 MG tablet Day 1&2: Take 8 mg (2 tablets) before breakfast; 4 mg (1 tablet) after lunch; 4 mg (1 tablet) after dinner; 8 mg (2 tablets) at bedtime)  Day 3&4: Take 4 mg (1 tablet) before breakfast; 4 mg (1 tablet) after lunch; 4 mg (1 tablet) after dinner; 8 mg (2 tablets) at bedtime Day 5&6: Take 4 mg (1 tablet) before breakfast; 4 mg (1 tablet) after lunch; 4 mg (1 tablet) after dinner; 4 mg (1 tablet) at bedtime Day 7&8: Take 4 mg (1 tablet) before breakfast; 4 mg (1 tablet) after lunch; 4 mg (1 tablet) at bedtime  Day 9&10: Take 4 mg (1 tablet) before breakfast; 4 mg (1 tablet) at bedtime Day 11&12: Take 4 mg (1 tablet) before breakfast 11/11/22   Kommor, Madison, MD  metroNIDAZOLE (FLAGYL) 500 MG tablet Take 1 tablet (500 mg total) by mouth 2 (two) times daily. 12/01/22   Smoot, Shawn Route, PA-C  valACYclovir (VALTREX) 500 MG tablet Take 1 tablet (500 mg total) by mouth 2 (two) times daily. Patient not taking: Reported on 03/19/2022 01/30/22   Petrucelli, Pleas Koch, PA-C      Allergies    Patient has no known allergies.    Review of Systems   Review of Systems  Constitutional:  Negative for chills and fever.  Gastrointestinal:  Negative for abdominal pain, nausea and vomiting.  Genitourinary:  Positive for genital sores. Negative for dysuria.    Physical Exam Updated Vital Signs BP (!) 153/105 (BP Location: Left Arm)   Pulse 98   Temp (!) 97.5 F (36.4 C) (Oral)   Resp 14   Ht 5\' 11"  (1.803 m)   Wt 124.7 kg   LMP  (LMP Unknown)   SpO2 95%   BMI 38.35 kg/m  Physical Exam Vitals and nursing note reviewed. Exam conducted with a chaperone present.  Constitutional:      General: She is not in acute distress.    Appearance: Normal appearance. She is well-developed. She is not ill-appearing or diaphoretic.  HENT:     Head: Normocephalic and atraumatic.  Eyes:     General:        Right eye: No discharge.        Left eye: No discharge.  Pulmonary:      Effort: Pulmonary effort is normal. No respiratory distress.  Abdominal:     General: Bowel sounds are normal.     Palpations: Abdomen is soft.     Tenderness: There is no abdominal tenderness.  Genitourinary:    Comments: Chaperone present during examination of the external genitalia. There is a small opening with minimal drainage at the left labia majora, no expressible drainage with palpation, there is some induration and warmth noted but no palpable areas of fluctuance and no crepitus.  Mild tenderness to palpation. Neurological:     Mental Status: She is alert and oriented to person, place, and time.     Coordination: Coordination normal.  Psychiatric:        Mood and Affect: Mood normal.        Behavior: Behavior normal.     ED Results / Procedures / Treatments   Labs (all labs ordered are listed, but only abnormal results are displayed) Labs Reviewed - No data to display  EKG None  Radiology No results found.  Procedures Procedures    Medications Ordered in ED Medications - No data to display  ED Course/ Medical Decision Making/ A&P                             Medical Decision Making  Patient presents for recheck of abscess to the left labia which started to spontaneously drain last night, on exam there is some induration but no additional areas of fluctuance or significant expressible drainage.  Do not feel that additional incision and drainage is indicated.  Encourage patient to continue antibiotics and to keep the area clean and perform warm compresses several times a day.  Return precautions provided.  At this time there does not appear to be any evidence of an acute emergency medical condition requiring further emergent evaluation and the patient appears stable for discharge with appropriate outpatient follow up. Diagnosis and return precautions discussed with patient who verbalizes understanding and is agreeable to discharge.           Final Clinical  Impression(s) / ED Diagnoses Final diagnoses:  Labial abscess    Rx / DC Orders ED Discharge Orders     None         Legrand Rams 12/16/22 1610    Arby Barrette, MD 12/21/22 1630

## 2022-12-02 NOTE — ED Triage Notes (Signed)
Pt BIB [facility] from [facility] due to abscess between vagina and anus.  Pt reports that it has ruptured and is currently bleeding.  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  Pt does have diabetes.  Pt is currently taking Flagyl and Metronidazole for it.

## 2022-12-02 NOTE — Discharge Instructions (Signed)
Your abscess is already draining so no additional drainage or incision is needed.  Continue taking antibiotics as prescribed and use warm compresses or warm soaks several times a day to help promote additional drainage and reduce inflammation.

## 2022-12-14 ENCOUNTER — Ambulatory Visit: Payer: Medicaid Other

## 2022-12-17 ENCOUNTER — Ambulatory Visit: Payer: Medicaid Other | Admitting: Internal Medicine

## 2022-12-22 ENCOUNTER — Encounter (HOSPITAL_COMMUNITY): Payer: Self-pay

## 2022-12-22 ENCOUNTER — Other Ambulatory Visit: Payer: Self-pay

## 2022-12-22 ENCOUNTER — Emergency Department (HOSPITAL_COMMUNITY)
Admission: EM | Admit: 2022-12-22 | Discharge: 2022-12-22 | Disposition: A | Discharge status: Home / Self Care | Payer: MEDICAID | Attending: Emergency Medicine | Admitting: Emergency Medicine

## 2022-12-22 DIAGNOSIS — E119 Type 2 diabetes mellitus without complications: Secondary | ICD-10-CM | POA: Diagnosis not present

## 2022-12-22 DIAGNOSIS — F1910 Other psychoactive substance abuse, uncomplicated: Secondary | ICD-10-CM | POA: Insufficient documentation

## 2022-12-22 DIAGNOSIS — L292 Pruritus vulvae: Secondary | ICD-10-CM | POA: Diagnosis present

## 2022-12-22 DIAGNOSIS — Z7984 Long term (current) use of oral hypoglycemic drugs: Secondary | ICD-10-CM | POA: Diagnosis not present

## 2022-12-22 DIAGNOSIS — B3731 Acute candidiasis of vulva and vagina: Secondary | ICD-10-CM | POA: Diagnosis not present

## 2022-12-22 LAB — I-STAT CHEM 8, ED
BUN: 5 mg/dL — ABNORMAL LOW (ref 6–20)
Calcium, Ion: 1.3 mmol/L (ref 1.15–1.40)
Chloride: 103 mmol/L (ref 98–111)
Creatinine, Ser: 0.6 mg/dL (ref 0.44–1.00)
Glucose, Bld: 377 mg/dL — ABNORMAL HIGH (ref 70–99)
HCT: 41 % (ref 36.0–46.0)
Hemoglobin: 13.9 g/dL (ref 12.0–15.0)
Potassium: 3.8 mmol/L (ref 3.5–5.1)
Sodium: 141 mmol/L (ref 135–145)
TCO2: 27 mmol/L (ref 22–32)

## 2022-12-22 MED ORDER — SODIUM CHLORIDE 0.9 % IV BOLUS
1000.0000 mL | Freq: Once | INTRAVENOUS | Status: AC
  Administered 2022-12-22: 1000 mL via INTRAVENOUS

## 2022-12-22 MED ORDER — MICONAZOLE NITRATE 2 % EX CREA: 1.0000 | TOPICAL_CREAM | Freq: Two times a day (BID) | CUTANEOUS | 0 refills | Status: DC

## 2022-12-22 NOTE — ED Provider Notes (Signed)
[place] EMERGENCY DEPARTMENT AT [facility] Provider Note   CSN: [id number] Arrival date & time: 12/22/22  2006     History  Chief Complaint  Patient presents with   Weakness    Julie [name] is a 47 y.o. female.  Patient states she used cocaine 3 times a day and feels funny.  She also complains of itching in her vagina.   Patient has a history of diabetes  The history is provided by the patient and medical records. No language interpreter was used.  Weakness Severity:  Mild Onset quality:  Sudden Timing:  Constant Progression:  Waxing and waning Chronicity:  New Context: not alcohol use   Relieved by:  Nothing Worsened by:  Nothing Ineffective treatments:  None tried Associated symptoms: no abdominal pain, no chest pain, no cough, no diarrhea, no frequency, no headaches and no seizures        Home Medications Prior to Admission medications   Medication Sig Start Date End Date Taking? Authorizing Provider  metFORMIN (GLUCOPHAGE) 500 MG tablet Take 1 tablet (500 mg total) by mouth 2 (two) times daily with a meal. 12/01/22  Yes Smoot, Sarah A, PA-C  miconazole (MICOTIN) 2 % cream Apply 1 Application topically 2 (two) times daily. Apply in your vagina and around the outside of your vagina twice a day 12/22/22  Yes Bethann Berkshire, MD  acetaminophen (TYLENOL) 500 MG tablet Take 2,000 mg by mouth 2 (two) times daily as needed (pain). Patient not taking: Reported on 07/20/2022    [provider]  cefdinir (OMNICEF) 300 MG capsule Take 1 capsule (300 mg total) by mouth 2 (two) times daily. Patient not taking: Reported on 12/22/2022 12/01/22   Smoot, Shawn Route, PA-C  clotrimazole (LOTRIMIN) 1 % cream Apply to affected area 2 times daily for one week Patient not taking: Reported on 12/22/2022 11/23/22   Cathren Laine, MD  methylPREDNISolone (MEDROL) 4 MG tablet Day 1&2: Take 8 mg (2 tablets) before breakfast; 4 mg (1 tablet) after lunch; 4 mg (1 tablet) after dinner; 8  mg (2 tablets) at bedtime)  Day 3&4: Take 4 mg (1 tablet) before breakfast; 4 mg (1 tablet) after lunch; 4 mg (1 tablet) after dinner; 8 mg (2 tablets) at bedtime Day 5&6: Take 4 mg (1 tablet) before breakfast; 4 mg (1 tablet) after lunch; 4 mg (1 tablet) after dinner; 4 mg (1 tablet) at bedtime Day 7&8: Take 4 mg (1 tablet) before breakfast; 4 mg (1 tablet) after lunch; 4 mg (1 tablet) at bedtime  Day 9&10: Take 4 mg (1 tablet) before breakfast; 4 mg (1 tablet) at bedtime Day 11&12: Take 4 mg (1 tablet) before breakfast Patient not taking: Reported on 12/22/2022 11/11/22   Kommor, Wyn Forster, MD  metroNIDAZOLE (FLAGYL) 500 MG tablet Take 1 tablet (500 mg total) by mouth 2 (two) times daily. Patient not taking: Reported on 12/22/2022 12/01/22   Smoot, Shawn Route, PA-C  valACYclovir (VALTREX) 500 MG tablet Take 1 tablet (500 mg total) by mouth 2 (two) times daily. Patient not taking: Reported on 03/19/2022 01/30/22   Petrucelli, Pleas Koch, PA-C      Allergies    Patient has no known allergies.    Review of Systems   Review of Systems  Constitutional:  Negative for appetite change and fatigue.  HENT:  Negative for congestion, ear discharge and sinus pressure.   Eyes:  Negative for discharge.  Respiratory:  Negative for cough.   Cardiovascular:  Negative for chest  pain.  Gastrointestinal:  Negative for abdominal pain and diarrhea.  Genitourinary:  Negative for frequency and hematuria.  Musculoskeletal:  Negative for back pain.  Skin:  Negative for rash.       Vaginal itching  Neurological:  Positive for weakness. Negative for seizures and headaches.  Psychiatric/Behavioral:  Negative for hallucinations.     Physical Exam Updated Vital Signs BP (!) 152/86 (BP Location: Right Arm)   Pulse (!) 120   Temp 98.4 F (36.9 C) (Oral)   Resp 16   Ht 5\' 11"  (1.803 m)   Wt 122.5 kg   LMP  (LMP Unknown)   SpO2 96%   BMI 37.66 kg/m  Physical Exam Vitals and nursing note reviewed.  Constitutional:       Appearance: She is well-developed.  HENT:     Head: Normocephalic.     Nose: Nose normal.  Eyes:     General: No scleral icterus.    Conjunctiva/sclera: Conjunctivae normal.  Neck:     Thyroid: No thyromegaly.  Cardiovascular:     Rate and Rhythm: Normal rate and regular rhythm.     Heart sounds: No murmur heard.    No friction rub. No gallop.  Pulmonary:     Breath sounds: No stridor. No wheezing or rales.  Chest:     Chest wall: No tenderness.  Abdominal:     General: There is no distension.     Tenderness: There is no abdominal tenderness. There is no rebound.  Genitourinary:    Comments: Yeast vaginitis Musculoskeletal:        General: Normal range of motion.     Cervical back: Neck supple.  Lymphadenopathy:     Cervical: No cervical adenopathy.  Skin:    Findings: No erythema or rash.  Neurological:     Mental Status: She is oriented to person, place, and time.     Motor: No abnormal muscle tone.     Coordination: Coordination normal.  Psychiatric:        Behavior: Behavior normal.     ED Results / Procedures / Treatments   Labs (all labs ordered are listed, but only abnormal results are displayed) Labs Reviewed  I-STAT CHEM 8, ED - Abnormal; Notable for the following components:      Result Value   BUN 5 (*)    Glucose, Bld 377 (*)    All other components within normal limits    EKG None  Radiology No results found.  Procedures Procedures    Medications Ordered in ED Medications  sodium chloride 0.9 % bolus 1,000 mL (0 mLs Intravenous Stopped 12/22/22 2309)    ED Course/ Medical Decision Making/ A&P                             Medical Decision Making Amount and/or Complexity of Data Reviewed ECG/medicine tests: ordered.   This patient presents to the ED for concern of weakness and itching in her vagina, this involves an extensive number of treatment options, and is a complaint that carries with it a high risk of complications and morbidity.   The differential diagnosis includes anemia, diabetes   Co morbidities that complicate the patient evaluation  Diabetes   Additional history obtained:  Additional history obtained from patient External records from outside source obtained and reviewed including hospital records   Lab Tests:  I Ordered, and personally interpreted labs.  The pertinent results include: Glucose 377  Imaging Studies ordered:  No imaging  Cardiac Monitoring: / EKG:  The patient was maintained on a cardiac monitor.  I personally viewed and interpreted the cardiac monitored which showed an underlying rhythm of: Normal sinus rhythm   Consultations Obtained:  No consultant  Problem List / ED Course / Critical interventions / Medication management  Diabetes and substance abuse No medicines ordered Reevaluation of the patient after these medicines showed that the patient improved I have reviewed the patients home medicines and have made adjustments as needed   Social Determinants of Health:  Substance abuse   Test / Admission - Considered:  None    Patient with substance abuse, poorly controlled diabetes and yeast vaginitis.  She is given Monistat and will follow-up with her primary care doctor        Final Clinical Impression(s) / ED Diagnoses Final diagnoses:  Substance abuse (HCC)  Yeast vaginitis    Rx / DC Orders ED Discharge Orders          Ordered    miconazole (MICOTIN) 2 % cream  2 times daily        12/22/22 2347              Bethann Berkshire, MD 12/24/22 1440

## 2022-12-22 NOTE — Discharge Instructions (Signed)
Follow-up with your doctors in the next week

## 2022-12-22 NOTE — ED Triage Notes (Signed)
Pt reports feeling weak x 3 hours after snorting cocaine.  Reports urinary incontenice that has worsen as well.  Pt saturated in urine upon arrival.  Pt stood to change clothes & urinated in floor

## 2023-01-01 ENCOUNTER — Emergency Department (HOSPITAL_COMMUNITY)
Admission: EM | Admit: 2023-01-01 | Discharge: 2023-01-01 | Disposition: A | Discharge status: Home / Self Care | Payer: MEDICAID | Attending: Emergency Medicine | Admitting: Emergency Medicine

## 2023-01-01 ENCOUNTER — Encounter (HOSPITAL_COMMUNITY): Payer: Self-pay

## 2023-01-01 ENCOUNTER — Other Ambulatory Visit: Payer: Self-pay

## 2023-01-01 DIAGNOSIS — E669 Obesity, unspecified: Secondary | ICD-10-CM | POA: Diagnosis not present

## 2023-01-01 DIAGNOSIS — B3731 Acute candidiasis of vulva and vagina: Secondary | ICD-10-CM | POA: Diagnosis not present

## 2023-01-01 DIAGNOSIS — R739 Hyperglycemia, unspecified: Secondary | ICD-10-CM | POA: Diagnosis not present

## 2023-01-01 DIAGNOSIS — Z6839 Body mass index (BMI) 39.0-39.9, adult: Secondary | ICD-10-CM | POA: Insufficient documentation

## 2023-01-01 DIAGNOSIS — N7689 Other specified inflammation of vagina and vulva: Secondary | ICD-10-CM | POA: Diagnosis present

## 2023-01-01 LAB — CBC WITH DIFFERENTIAL/PLATELET
Abs Immature Granulocytes: 0.02 10*3/uL (ref 0.00–0.07)
Basophils Absolute: 0.1 10*3/uL (ref 0.0–0.1)
Basophils Relative: 1 %
Eosinophils Absolute: 0 10*3/uL (ref 0.0–0.5)
Eosinophils Relative: 0 %
HCT: 44.4 % (ref 36.0–46.0)
Hemoglobin: 14.1 g/dL (ref 12.0–15.0)
Immature Granulocytes: 0 %
Lymphocytes Relative: 22 %
Lymphs Abs: 2.8 10*3/uL (ref 0.7–4.0)
MCH: 27.1 pg (ref 26.0–34.0)
MCHC: 31.8 g/dL (ref 30.0–36.0)
MCV: 85.4 fL (ref 80.0–100.0)
Monocytes Absolute: 0.8 10*3/uL (ref 0.1–1.0)
Monocytes Relative: 6 %
Neutro Abs: 9 10*3/uL — ABNORMAL HIGH (ref 1.7–7.7)
Neutrophils Relative %: 71 %
Platelets: 236 10*3/uL (ref 150–400)
RBC: 5.2 MIL/uL — ABNORMAL HIGH (ref 3.87–5.11)
RDW: 13.1 % (ref 11.5–15.5)
WBC: 12.6 10*3/uL — ABNORMAL HIGH (ref 4.0–10.5)
nRBC: 0 % (ref 0.0–0.2)

## 2023-01-01 LAB — I-STAT VENOUS BLOOD GAS, ED
Acid-Base Excess: 1 mmol/L (ref 0.0–2.0)
Bicarbonate: 26.3 mmol/L (ref 20.0–28.0)
Calcium, Ion: 1.32 mmol/L (ref 1.15–1.40)
HCT: 45 % (ref 36.0–46.0)
Hemoglobin: 15.3 g/dL — ABNORMAL HIGH (ref 12.0–15.0)
O2 Saturation: 98 %
Potassium: 4 mmol/L (ref 3.5–5.1)
Sodium: 137 mmol/L (ref 135–145)
TCO2: 28 mmol/L (ref 22–32)
pCO2, Ven: 45.3 mmHg (ref 44–60)
pH, Ven: 7.372 (ref 7.25–7.43)
pO2, Ven: 118 mmHg — ABNORMAL HIGH (ref 32–45)

## 2023-01-01 LAB — BASIC METABOLIC PANEL
Anion gap: 10 (ref 5–15)
BUN: 7 mg/dL (ref 6–20)
CO2: 24 mmol/L (ref 22–32)
Calcium: 10.3 mg/dL (ref 8.9–10.3)
Chloride: 103 mmol/L (ref 98–111)
Creatinine, Ser: 0.66 mg/dL (ref 0.44–1.00)
GFR, Estimated: >60 mL/min (ref >60)
Glucose, Bld: 386 mg/dL — ABNORMAL HIGH (ref 70–99)
Potassium: 4 mmol/L (ref 3.5–5.1)
Sodium: 137 mmol/L (ref 135–145)

## 2023-01-01 LAB — URINALYSIS, ROUTINE W REFLEX MICROSCOPIC
Bilirubin Urine: NEGATIVE
Glucose, UA: >=500 mg/dL — AB
Ketones, ur: 5 mg/dL — AB
Nitrite: NEGATIVE
Protein, ur: 100 mg/dL — AB
RBC / HPF: >50 RBC/hpf (ref 0–5)
Specific Gravity, Urine: 1.025 (ref 1.005–1.030)
WBC, UA: >50 WBC/hpf (ref 0–5)
pH: 5 (ref 5.0–8.0)

## 2023-01-01 LAB — CBG MONITORING, ED: Glucose-Capillary: 380 mg/dL — ABNORMAL HIGH (ref 70–99)

## 2023-01-01 LAB — PREGNANCY, URINE: Preg Test, Ur: NEGATIVE

## 2023-01-01 MED ORDER — FLUCONAZOLE 100 MG PO TABS
100.0000 mg | ORAL_TABLET | Freq: Once | ORAL | Status: AC
  Administered 2023-01-01: 100 mg via ORAL
  Filled 2023-01-01: qty 1

## 2023-01-01 MED ORDER — MICONAZOLE NITRATE 2 % EX CREA
1.0000 | TOPICAL_CREAM | Freq: Two times a day (BID) | CUTANEOUS | Status: DC
  Administered 2023-01-01: 1 via TOPICAL
  Filled 2023-01-01: qty 28.4

## 2023-01-01 MED ORDER — FLUCONAZOLE 200 MG PO TABS: 200.0000 mg | ORAL_TABLET | Freq: Every day | ORAL | 0 refills | Status: AC

## 2023-01-01 MED ORDER — METFORMIN HCL 500 MG PO TABS
500.0000 mg | ORAL_TABLET | Freq: Two times a day (BID) | ORAL | Status: DC
  Administered 2023-01-01: 500 mg via ORAL
  Filled 2023-01-01: qty 1
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
## 2023-01-01 NOTE — ED Triage Notes (Addendum)
Patient BIB [facility] with complaint of possible vaginal abscess & vaginal irritation that radiates to buttox.   Per EMS CBG 448, patient non compliant with medication

## 2023-01-01 NOTE — Discharge Instructions (Signed)
It is very important that you use the provided yeast medication and continue taking your other medications as previously prescribed.  Return here for concerning changes in your condition.

## 2023-01-12 ENCOUNTER — Emergency Department (HOSPITAL_COMMUNITY)
Admission: EM | Admit: 2023-01-12 | Discharge: 2023-01-12 | Disposition: A | Discharge status: Home / Self Care | Payer: MEDICAID | Attending: Student | Admitting: Student

## 2023-01-12 ENCOUNTER — Other Ambulatory Visit: Payer: Self-pay

## 2023-01-12 DIAGNOSIS — J45909 Unspecified asthma, uncomplicated: Secondary | ICD-10-CM | POA: Insufficient documentation

## 2023-01-12 DIAGNOSIS — H9201 Otalgia, right ear: Secondary | ICD-10-CM | POA: Insufficient documentation

## 2023-01-12 MED ORDER — OFLOXACIN 0.3 % OP SOLN
5.0000 [drp] | Freq: Every day | OPHTHALMIC | Status: DC
  Administered 2023-01-12: 5 [drp] via OTIC
  Filled 2023-01-12: qty 5

## 2023-01-12 MED ORDER — IBUPROFEN 800 MG PO TABS
800.0000 mg | ORAL_TABLET | Freq: Once | ORAL | Status: DC
  Filled 2023-01-12: qty 1

## 2023-01-12 MED ORDER — NAPROXEN 250 MG PO TABS
500.0000 mg | ORAL_TABLET | Freq: Once | ORAL | Status: AC
  Administered 2023-01-12: 500 mg via ORAL
  Filled 2023-01-12: qty 2

## 2023-01-12 NOTE — ED Triage Notes (Signed)
Patient reports right ear ache this evening .

## 2023-01-12 NOTE — Discharge Instructions (Signed)
You were seen here for an earache. Please use ibuprofen or tylenol to control pain symptoms. A warm compress may be helpful as well. Please return for worsening symptoms.

## 2023-01-12 NOTE — ED Provider Notes (Signed)
[place] EMERGENCY DEPARTMENT AT [facility] Provider Note   CSN: [id number] Arrival date & time: 01/12/23  4098     History  Chief Complaint  Patient presents with   Otalgia    Julie [name] is a 47 y.o. female. With past medical history of asthma, cocaine abuse who presents to the emergency department with otalgia.  States she has had two days of right sided ear ache. She has difficulty elaborating on symptoms, just states it hurts. It radiates to her right face and just behind her ear. She denies having sore throat, fever, URI symptoms. Denies any foreign bodies.    Otalgia      Home Medications Prior to Admission medications   Medication Sig Start Date End Date Taking? Authorizing Provider  acetaminophen (TYLENOL) 500 MG tablet Take 2,000 mg by mouth 2 (two) times daily as needed (pain). Patient not taking: Reported on 07/20/2022    [provider]  cefdinir (OMNICEF) 300 MG capsule Take 1 capsule (300 mg total) by mouth 2 (two) times daily. Patient not taking: Reported on 12/22/2022 12/01/22   Smoot, Shawn Route, PA-C  clotrimazole (LOTRIMIN) 1 % cream Apply to affected area 2 times daily for one week Patient not taking: Reported on 12/22/2022 11/23/22   Cathren Laine, MD  metFORMIN (GLUCOPHAGE) 500 MG tablet Take 1 tablet (500 mg total) by mouth 2 (two) times daily with a meal. 12/01/22   Smoot, Shawn Route, PA-C  methylPREDNISolone (MEDROL) 4 MG tablet Day 1&2: Take 8 mg (2 tablets) before breakfast; 4 mg (1 tablet) after lunch; 4 mg (1 tablet) after dinner; 8 mg (2 tablets) at bedtime)  Day 3&4: Take 4 mg (1 tablet) before breakfast; 4 mg (1 tablet) after lunch; 4 mg (1 tablet) after dinner; 8 mg (2 tablets) at bedtime Day 5&6: Take 4 mg (1 tablet) before breakfast; 4 mg (1 tablet) after lunch; 4 mg (1 tablet) after dinner; 4 mg (1 tablet) at bedtime Day 7&8: Take 4 mg (1 tablet) before breakfast; 4 mg (1 tablet) after lunch; 4 mg (1 tablet) at bedtime  Day 9&10:  Take 4 mg (1 tablet) before breakfast; 4 mg (1 tablet) at bedtime Day 11&12: Take 4 mg (1 tablet) before breakfast Patient not taking: Reported on 12/22/2022 11/11/22   Kommor, Wyn Forster, MD  metroNIDAZOLE (FLAGYL) 500 MG tablet Take 1 tablet (500 mg total) by mouth 2 (two) times daily. Patient not taking: Reported on 12/22/2022 12/01/22   Smoot, Shawn Route, PA-C  miconazole (MICOTIN) 2 % cream Apply 1 Application topically 2 (two) times daily. Apply in your vagina and around the outside of your vagina twice a day 12/22/22   Bethann Berkshire, MD  valACYclovir (VALTREX) 500 MG tablet Take 1 tablet (500 mg total) by mouth 2 (two) times daily. Patient not taking: Reported on 03/19/2022 01/30/22   Petrucelli, Pleas Koch, PA-C      Allergies    Patient has no known allergies.    Review of Systems   Review of Systems  HENT:  Positive for ear pain.   All other systems reviewed and are negative.   Physical Exam Updated Vital Signs BP (!) 125/94 (BP Location: Right Arm)   Temp 98.4 F (36.9 C)   Resp 16   SpO2 95%  Physical Exam Vitals and nursing note reviewed.  Constitutional:      General: She is not in acute distress.    Appearance: Normal appearance. She is obese. She is not ill-appearing or  toxic-appearing.  HENT:     Head: Normocephalic and atraumatic.     Right Ear: Tympanic membrane and ear canal normal. There is no impacted cerumen.     Left Ear: Tympanic membrane and ear canal normal. There is no impacted cerumen.     Nose: Nose normal. No congestion.     Mouth/Throat:     Mouth: Mucous membranes are moist.     Pharynx: Oropharynx is clear. Posterior oropharyngeal erythema present. No oropharyngeal exudate.     Tonsils: No tonsillar exudate or tonsillar abscesses. 2+ on the right. 2+ on the left.  Eyes:     General: No scleral icterus.    Extraocular Movements: Extraocular movements intact.     Pupils: Pupils are equal, round, and reactive to light.  Pulmonary:     Effort: Pulmonary  effort is normal. No respiratory distress.  Musculoskeletal:     Cervical back: Neck supple.  Skin:    General: Skin is warm and dry.     Findings: No rash.  Neurological:     General: No focal deficit present.     Mental Status: She is alert and oriented to person, place, and time.  Psychiatric:        Mood and Affect: Mood normal.        Behavior: Behavior normal.        Thought Content: Thought content normal.        Judgment: Judgment normal.     ED Results / Procedures / Treatments   Labs (all labs ordered are listed, but only abnormal results are displayed) Labs Reviewed - No data to display  EKG None  Radiology No results found.  Procedures Procedures   Medications Ordered in ED Medications  naproxen (NAPROSYN) tablet 500 mg (has no administration in time range)  ofloxacin (OCUFLOX) 0.3 % ophthalmic solution 5 drop (has no administration in time range)    ED Course/ Medical Decision Making/ A&P   {    Medical Decision Making Risk Prescription drug management.  Initial Impression and Ddx 47 year old female who presents to the emergency department with ear ache. Patient PMH that increases complexity of ED encounter:  asthma, cocaine abuse Differential: AOM, AOE, mastoiditis, malignant otitis externa, viral infection, foreign body, etc.   Interpretation of Diagnostics I independent reviewed and interpreted the labs as followed: not indicated  - I independently visualized the following imaging with scope of interpretation limited to determining acute life threatening conditions related to emergency care: not indicated   Patient Reassessment and Ultimate Disposition/Management 47 year old female who presents to the emergency department with otalgia. She is well appearing. Non toxic in appearance. Afebrile.  Bilateral TM without evidence of infection. External canals without impacted cerumen or foreign bodies. Bilateral mastoids without swelling, erythema or  tenderness She has bilateral tonsillar 2+ swelling without exudate. No evidence of PTA, ludwigs. Symptoms inconsistent with an epiglottitis. No salivary gland swelling. No Zoster rash. No facial swelling.   Possibly early viral infection. Giving ibuprofen which the patient refused. Will give patient ofloxacin ear drops to use over the next week per Dr. Posey Rea. Given return precautions for any worsening symptoms.   Patient management required discussion with the following services or consulting groups:  None  Complexity of Problems Addressed Acute uncomplicated illness or injury with no diagnostics  Additional Data Reviewed and Analyzed Further history obtained from: Prior ED visit notes and Care Everywhere  Patient Encounter Risk Assessment Prescriptions and SDOH impact on management  Final Clinical Impression(s) /  ED Diagnoses Final diagnoses:  Right ear pain    Rx / DC Orders ED Discharge Orders     None         Cristopher Peru, PA-C 01/12/23 0516    Glendora Score, MD 01/12/23 2046

## 2023-01-12 NOTE — ED Notes (Addendum)
Pt is refusing ibuprofen and discharge, demanding face xray. Provider made aware

## 2023-01-16 ENCOUNTER — Encounter (HOSPITAL_COMMUNITY): Payer: Self-pay

## 2023-01-16 ENCOUNTER — Other Ambulatory Visit: Payer: Self-pay

## 2023-01-16 ENCOUNTER — Emergency Department (HOSPITAL_COMMUNITY)
Admission: EM | Admit: 2023-01-16 | Discharge: 2023-01-17 | Disposition: A | Discharge status: Home / Self Care | Payer: MEDICAID | Attending: Emergency Medicine | Admitting: Emergency Medicine

## 2023-01-16 DIAGNOSIS — N390 Urinary tract infection, site not specified: Secondary | ICD-10-CM | POA: Insufficient documentation

## 2023-01-16 DIAGNOSIS — R109 Unspecified abdominal pain: Secondary | ICD-10-CM | POA: Diagnosis present

## 2023-01-16 LAB — CBC
HCT: 44.4 % (ref 36.0–46.0)
Hemoglobin: 14.3 g/dL (ref 12.0–15.0)
MCH: 27.7 pg (ref 26.0–34.0)
MCHC: 32.2 g/dL (ref 30.0–36.0)
MCV: 85.9 fL (ref 80.0–100.0)
Platelets: 228 10*3/uL (ref 150–400)
RBC: 5.17 MIL/uL — ABNORMAL HIGH (ref 3.87–5.11)
RDW: 13.5 % (ref 11.5–15.5)
WBC: 9.2 10*3/uL (ref 4.0–10.5)
nRBC: 0 % (ref 0.0–0.2)

## 2023-01-16 LAB — URINALYSIS, ROUTINE W REFLEX MICROSCOPIC
Bilirubin Urine: NEGATIVE
Glucose, UA: >=500 mg/dL — AB
Ketones, ur: NEGATIVE mg/dL
Nitrite: NEGATIVE
Protein, ur: 100 mg/dL — AB
RBC / HPF: >50 RBC/hpf (ref 0–5)
Specific Gravity, Urine: 1.025 (ref 1.005–1.030)
WBC, UA: >50 WBC/hpf (ref 0–5)
pH: 5 (ref 5.0–8.0)

## 2023-01-16 LAB — BASIC METABOLIC PANEL
Anion gap: 10 (ref 5–15)
BUN: 6 mg/dL (ref 6–20)
CO2: 23 mmol/L (ref 22–32)
Calcium: 10.5 mg/dL — ABNORMAL HIGH (ref 8.9–10.3)
Chloride: 104 mmol/L (ref 98–111)
Creatinine, Ser: 0.79 mg/dL (ref 0.44–1.00)
GFR, Estimated: >60 mL/min (ref >60)
Glucose, Bld: 352 mg/dL — ABNORMAL HIGH (ref 70–99)
Potassium: 3.5 mmol/L (ref 3.5–5.1)
Sodium: 137 mmol/L (ref 135–145)

## 2023-01-16 LAB — HCG, SERUM, QUALITATIVE: Preg, Serum: NEGATIVE

## 2023-01-16 NOTE — ED Triage Notes (Signed)
Pt reports urinary and bowel incontinence with abd pain

## 2023-01-17 ENCOUNTER — Encounter (HOSPITAL_COMMUNITY): Payer: Self-pay | Admitting: Behavioral Health

## 2023-01-17 ENCOUNTER — Ambulatory Visit (HOSPITAL_COMMUNITY): Admission: EM | Admit: 2023-01-17 | Discharge: 2023-01-17 | Discharge status: Against Medical Advice | Payer: MEDICAID

## 2023-01-17 MED ORDER — FOSFOMYCIN TROMETHAMINE 3 G PO PACK
3.0000 g | PACK | Freq: Once | ORAL | Status: AC
  Administered 2023-01-17: 3 g via ORAL
  Filled 2023-01-17: qty 3

## 2023-01-17 MED ORDER — FLUCONAZOLE 150 MG PO TABS
150.0000 mg | ORAL_TABLET | Freq: Once | ORAL | Status: AC
  Administered 2023-01-17: 150 mg via ORAL
  Filled 2023-01-17: qty 1

## 2023-01-17 NOTE — ED Notes (Signed)
Pt left without being seen.

## 2023-01-17 NOTE — ED Provider Notes (Signed)
[place] EMERGENCY DEPARTMENT AT [facility] Provider Note   CSN: [id number] Arrival date & time: 01/16/23  2139     History  Chief Complaint  Patient presents with   Urinary Frequency    Julie [name] is a 47 y.o. female.  Presents with several complaints.  Patient reports that she smoked crack cocaine earlier and then felt funny.  She cannot describe the sensation and it has resolved.  No chest pain.  No difficulty breathing.  Patient reports that she has noticed for the last couple of days that she has pain at the end of her urinary stream.       Home Medications Prior to Admission medications   Medication Sig Start Date End Date Taking? Authorizing Provider  acetaminophen (TYLENOL) 500 MG tablet Take 2,000 mg by mouth 2 (two) times daily as needed (pain). Patient not taking: Reported on 07/20/2022    [provider]  cefdinir (OMNICEF) 300 MG capsule Take 1 capsule (300 mg total) by mouth 2 (two) times daily. Patient not taking: Reported on 12/22/2022 12/01/22   Smoot, Shawn Route, PA-C  clotrimazole (LOTRIMIN) 1 % cream Apply to affected area 2 times daily for one week Patient not taking: Reported on 12/22/2022 11/23/22   Cathren Laine, MD  metFORMIN (GLUCOPHAGE) 500 MG tablet Take 1 tablet (500 mg total) by mouth 2 (two) times daily with a meal. 12/01/22   Smoot, Shawn Route, PA-C  methylPREDNISolone (MEDROL) 4 MG tablet Day 1&2: Take 8 mg (2 tablets) before breakfast; 4 mg (1 tablet) after lunch; 4 mg (1 tablet) after dinner; 8 mg (2 tablets) at bedtime)  Day 3&4: Take 4 mg (1 tablet) before breakfast; 4 mg (1 tablet) after lunch; 4 mg (1 tablet) after dinner; 8 mg (2 tablets) at bedtime Day 5&6: Take 4 mg (1 tablet) before breakfast; 4 mg (1 tablet) after lunch; 4 mg (1 tablet) after dinner; 4 mg (1 tablet) at bedtime Day 7&8: Take 4 mg (1 tablet) before breakfast; 4 mg (1 tablet) after lunch; 4 mg (1 tablet) at bedtime  Day 9&10: Take 4 mg (1 tablet) before breakfast;  4 mg (1 tablet) at bedtime Day 11&12: Take 4 mg (1 tablet) before breakfast Patient not taking: Reported on 12/22/2022 11/11/22   Kommor, Wyn Forster, MD  metroNIDAZOLE (FLAGYL) 500 MG tablet Take 1 tablet (500 mg total) by mouth 2 (two) times daily. Patient not taking: Reported on 12/22/2022 12/01/22   Smoot, Shawn Route, PA-C  miconazole (MICOTIN) 2 % cream Apply 1 Application topically 2 (two) times daily. Apply in your vagina and around the outside of your vagina twice a day 12/22/22   Bethann Berkshire, MD  valACYclovir (VALTREX) 500 MG tablet Take 1 tablet (500 mg total) by mouth 2 (two) times daily. Patient not taking: Reported on 03/19/2022 01/30/22   Petrucelli, Pleas Koch, PA-C      Allergies    Patient has no known allergies.    Review of Systems   Review of Systems  Physical Exam Updated Vital Signs BP (!) 141/99   Pulse 96   Temp 98.3 F (36.8 C)   Resp 20   SpO2 97%  Physical Exam Vitals and nursing note reviewed.  Constitutional:      General: She is not in acute distress.    Appearance: She is well-developed.  HENT:     Head: Normocephalic and atraumatic.     Mouth/Throat:     Mouth: Mucous membranes are moist.  Eyes:  General: Vision grossly intact. Gaze aligned appropriately.     Extraocular Movements: Extraocular movements intact.     Conjunctiva/sclera: Conjunctivae normal.  Cardiovascular:     Rate and Rhythm: Normal rate and regular rhythm.     Pulses: Normal pulses.     Heart sounds: Normal heart sounds, S1 normal and S2 normal. No murmur heard.    No friction rub. No gallop.  Pulmonary:     Effort: Pulmonary effort is normal. No respiratory distress.     Breath sounds: Normal breath sounds.  Abdominal:     General: Bowel sounds are normal.     Palpations: Abdomen is soft.     Tenderness: There is no abdominal tenderness. There is no guarding or rebound.     Hernia: No hernia is present.  Musculoskeletal:        General: No swelling.     Cervical back: Full  passive range of motion without pain, normal range of motion and neck supple. No spinous process tenderness or muscular tenderness. Normal range of motion.     Right lower leg: No edema.     Left lower leg: No edema.  Skin:    General: Skin is warm and dry.     Capillary Refill: Capillary refill takes less than 2 seconds.     Findings: No ecchymosis, erythema, rash or wound.  Neurological:     General: No focal deficit present.     Mental Status: She is alert and oriented to person, place, and time.     GCS: GCS eye subscore is 4. GCS verbal subscore is 5. GCS motor subscore is 6.     Cranial Nerves: Cranial nerves 2-12 are intact.     Sensory: Sensation is intact.     Motor: Motor function is intact.     Coordination: Coordination is intact.  Psychiatric:        Attention and Perception: Attention normal.        Mood and Affect: Mood normal.        Speech: Speech normal.        Behavior: Behavior normal.     ED Results / Procedures / Treatments   Labs (all labs ordered are listed, but only abnormal results are displayed) Labs Reviewed  URINALYSIS, ROUTINE W REFLEX MICROSCOPIC - Abnormal; Notable for the following components:      Result Value   APPearance CLOUDY (*)    Glucose, UA >=500 (*)    Hgb urine dipstick LARGE (*)    Protein, ur 100 (*)    Leukocytes,Ua LARGE (*)    Bacteria, UA MANY (*)    Non Squamous Epithelial 0-5 (*)    All other components within normal limits  BASIC METABOLIC PANEL - Abnormal; Notable for the following components:   Glucose, Bld 352 (*)    Calcium 10.5 (*)    All other components within normal limits  CBC - Abnormal; Notable for the following components:   RBC 5.17 (*)    All other components within normal limits  HCG, SERUM, QUALITATIVE    EKG None  Radiology No results found.  Procedures Procedures    Medications Ordered in ED Medications  fosfomycin (MONUROL) packet 3 g (has no administration in time range)  fluconazole  (DIFLUCAN) tablet 150 mg (has no administration in time range)    ED Course/ Medical Decision Making/ A&P  Medical Decision Making Amount and/or Complexity of Data Reviewed External Data Reviewed: labs and notes. Labs: ordered. Decision-making details documented in ED Course.  Risk Prescription drug management.   Urinalysis grossly abnormal.  This has been seen similar in the past.  It appears that she was prescribed antibiotics for UTI in the past but did not fill the prescription.  Patient with homelessness.  Manger of workup reassuring.  Treat with single dose fosfomycin and Diflucan.  Patient requesting social work consult.        Final Clinical Impression(s) / ED Diagnoses Final diagnoses:  Lower urinary tract infectious disease    Rx / DC Orders ED Discharge Orders     None         Gaje Tennyson, Canary Brim, MD 01/17/23 [phone]

## 2023-01-17 NOTE — Progress Notes (Signed)
   01/17/23 1116  [facility] Triage Screening (Walk-ins at [facility] only)  How Did You Hear About [place]? Hospital Discharge  What Is the Reason for Your Visit/Call Today? Pt presents to [facility] voluntarily seeking substance use treatment. Pt reports daily cocaine use about $150 worth, last use was yesterday about $40 worth. She also reports consuming 2 shots of liquor yesterday.Pt states she is seeking help for her addiction because yesterday she was using and started to smell poop and realized she had defecated on herself without her knowledge. Pt states she was seen and treated at the [facility] yesterday and discharged this morning. Pt reports passive SI but denies any plans or intent to harm herself. Pt denies HI and AVH.  How Long Has This Been Causing You Problems? > than 6 months  Have You Recently Had Any Thoughts About Hurting Yourself? No  Are You Planning to Commit Suicide/Harm Yourself At This time? No  Have you Recently Had Thoughts About Hurting Someone Karolee Ohs? No  Are You Planning To Harm Someone At This Time? No  Are you currently experiencing any auditory, visual or other hallucinations? No  Have You Used Any Alcohol or Drugs in the Past 24 Hours? Yes  How long ago did you use Drugs or Alcohol? yesterday  What Did You Use and How Much? daily cocaine use about $150 worth, last use was yesterday about $40 worth. She also reports consuming 2 shots of liquor yesterday.  Do you have any current medical co-morbidities that require immediate attention? No  Clinician description of patient physical appearance/behavior: calm, cooperative, wearing blue scrubs  What Do You Feel Would Help You the Most Today? Alcohol or Drug Use Treatment  If access to [facility] Urgent [facility] was not available, would you have sought care in the Emergency Department? No  Determination of Need Routine (7 days)  Options For Referral Outpatient Therapy;Medication Management

## 2023-02-16 ENCOUNTER — Emergency Department (HOSPITAL_COMMUNITY)
Admission: EM | Admit: 2023-02-16 | Discharge: 2023-02-17 | Disposition: A | Discharge status: Home / Self Care | Payer: MEDICAID | Attending: Emergency Medicine | Admitting: Emergency Medicine

## 2023-02-16 ENCOUNTER — Other Ambulatory Visit: Payer: Self-pay

## 2023-02-16 ENCOUNTER — Encounter (HOSPITAL_COMMUNITY): Payer: Self-pay | Admitting: Emergency Medicine

## 2023-02-16 DIAGNOSIS — N3001 Acute cystitis with hematuria: Secondary | ICD-10-CM | POA: Insufficient documentation

## 2023-02-16 DIAGNOSIS — R1030 Lower abdominal pain, unspecified: Secondary | ICD-10-CM | POA: Diagnosis present

## 2023-02-16 LAB — URINALYSIS, ROUTINE W REFLEX MICROSCOPIC
Bilirubin Urine: NEGATIVE
Glucose, UA: >=500 mg/dL — AB
Ketones, ur: NEGATIVE mg/dL
Nitrite: NEGATIVE
Protein, ur: 100 mg/dL — AB
RBC / HPF: >50 RBC/hpf (ref 0–5)
Specific Gravity, Urine: 1.015 (ref 1.005–1.030)
WBC, UA: >50 WBC/hpf (ref 0–5)
pH: 5 (ref 5.0–8.0)

## 2023-02-16 LAB — CBC
HCT: 42.9 % (ref 36.0–46.0)
Hemoglobin: 13.3 g/dL (ref 12.0–15.0)
MCH: 26.7 pg (ref 26.0–34.0)
MCHC: 31 g/dL (ref 30.0–36.0)
MCV: 86 fL (ref 80.0–100.0)
Platelets: 246 10*3/uL (ref 150–400)
RBC: 4.99 MIL/uL (ref 3.87–5.11)
RDW: 13.5 % (ref 11.5–15.5)
WBC: 7.7 10*3/uL (ref 4.0–10.5)
nRBC: 0 % (ref 0.0–0.2)

## 2023-02-16 LAB — LIPASE, BLOOD: Lipase: 32 U/L (ref 11–51)

## 2023-02-16 LAB — COMPREHENSIVE METABOLIC PANEL
ALT: 15 U/L (ref 0–44)
AST: 19 U/L (ref 15–41)
Albumin: 3.5 g/dL (ref 3.5–5.0)
Alkaline Phosphatase: 56 U/L (ref 38–126)
Anion gap: 9 (ref 5–15)
BUN: 6 mg/dL (ref 6–20)
CO2: 22 mmol/L (ref 22–32)
Calcium: 10.1 mg/dL (ref 8.9–10.3)
Chloride: 105 mmol/L (ref 98–111)
Creatinine, Ser: 0.77 mg/dL (ref 0.44–1.00)
GFR, Estimated: >60 mL/min (ref >60)
Glucose, Bld: 305 mg/dL — ABNORMAL HIGH (ref 70–99)
Potassium: 3.4 mmol/L — ABNORMAL LOW (ref 3.5–5.1)
Sodium: 136 mmol/L (ref 135–145)
Total Bilirubin: 0.5 mg/dL (ref 0.3–1.2)
Total Protein: 6.9 g/dL (ref 6.5–8.1)

## 2023-02-16 LAB — HCG, SERUM, QUALITATIVE: Preg, Serum: NEGATIVE

## 2023-02-16 NOTE — ED Triage Notes (Signed)
Pt with abdominal pain and burning accompanied by nausea today.  Pt reports also she has pain/pressure at the end of her urinary stream.  Pt states she recently took anbx for UTI and did complete those.  Pt has requested STI screening as well.

## 2023-02-17 ENCOUNTER — Emergency Department (HOSPITAL_COMMUNITY): Payer: MEDICAID

## 2023-02-17 LAB — RPR: RPR Ser Ql: NONREACTIVE

## 2023-02-17 LAB — HIV ANTIBODY (ROUTINE TESTING W REFLEX): HIV Screen 4th Generation wRfx: NONREACTIVE

## 2023-02-17 MED ORDER — ACETAMINOPHEN 325 MG PO TABS
650.0000 mg | ORAL_TABLET | Freq: Once | ORAL | Status: AC
  Administered 2023-02-17: 650 mg via ORAL
  Filled 2023-02-17: qty 2

## 2023-02-17 MED ORDER — FOSFOMYCIN TROMETHAMINE 3 G PO PACK
3.0000 g | PACK | Freq: Once | ORAL | Status: AC
  Administered 2023-02-17: 3 g via ORAL
  Filled 2023-02-17: qty 3

## 2023-02-17 MED ORDER — IOHEXOL 350 MG/ML SOLN
100.0000 mL | Freq: Once | INTRAVENOUS | Status: AC | PRN
  Administered 2023-02-17: 100 mL via INTRAVENOUS

## 2023-02-17 NOTE — Discharge Instructions (Addendum)
Today you tested positive for a UTI and we treated you with 1 dose of fosfomycin.  Please return to ER symptoms change or worsen.

## 2023-02-17 NOTE — ED Provider Notes (Signed)
[place] EMERGENCY DEPARTMENT AT [facility] Provider Note   CSN: [id number] Arrival date & time: 02/16/23  2242     History  Chief Complaint  Patient presents with   Abdominal Pain    Julie [name] is a 47 y.o. female history of cocaine use disorder presented with lower abdominal pain for the past 8 days.  Patient states that she recently had UTI last [name] and took a lot of medications and states this feels very similar.  Patient also notes that she still has her appendix as well.  Patient denies any hematuria or vaginal bleeding or discharge.  Patient was seen last [name] and given 1 dose of fosfomycin.  Patient denies any fevers, flank pain, back pain, nausea/vomiting, change in sensation/motor skills, chest pain, shortness of breath.  Home Medications Prior to Admission medications   Medication Sig Start Date End Date Taking? Authorizing Provider  acetaminophen (TYLENOL) 500 MG tablet Take 2,000 mg by mouth 2 (two) times daily as needed (pain). Patient not taking: Reported on 07/20/2022    [provider]  cefdinir (OMNICEF) 300 MG capsule Take 1 capsule (300 mg total) by mouth 2 (two) times daily. Patient not taking: Reported on 12/22/2022 12/01/22   Smoot, Shawn Route, PA-C  clotrimazole (LOTRIMIN) 1 % cream Apply to affected area 2 times daily for one week Patient not taking: Reported on 12/22/2022 11/23/22   Cathren Laine, MD  metFORMIN (GLUCOPHAGE) 500 MG tablet Take 1 tablet (500 mg total) by mouth 2 (two) times daily with a meal. 12/01/22   Smoot, Shawn Route, PA-C  methylPREDNISolone (MEDROL) 4 MG tablet Day 1&2: Take 8 mg (2 tablets) before breakfast; 4 mg (1 tablet) after lunch; 4 mg (1 tablet) after dinner; 8 mg (2 tablets) at bedtime)  Day 3&4: Take 4 mg (1 tablet) before breakfast; 4 mg (1 tablet) after lunch; 4 mg (1 tablet) after dinner; 8 mg (2 tablets) at bedtime Day 5&6: Take 4 mg (1 tablet) before breakfast; 4 mg (1 tablet) after lunch; 4 mg (1 tablet) after  dinner; 4 mg (1 tablet) at bedtime Day 7&8: Take 4 mg (1 tablet) before breakfast; 4 mg (1 tablet) after lunch; 4 mg (1 tablet) at bedtime  Day 9&10: Take 4 mg (1 tablet) before breakfast; 4 mg (1 tablet) at bedtime Day 11&12: Take 4 mg (1 tablet) before breakfast Patient not taking: Reported on 12/22/2022 11/11/22   Kommor, Wyn Forster, MD  metroNIDAZOLE (FLAGYL) 500 MG tablet Take 1 tablet (500 mg total) by mouth 2 (two) times daily. Patient not taking: Reported on 12/22/2022 12/01/22   Smoot, Shawn Route, PA-C  miconazole (MICOTIN) 2 % cream Apply 1 Application topically 2 (two) times daily. Apply in your vagina and around the outside of your vagina twice a day 12/22/22   Bethann Berkshire, MD  valACYclovir (VALTREX) 500 MG tablet Take 1 tablet (500 mg total) by mouth 2 (two) times daily. Patient not taking: Reported on 03/19/2022 01/30/22   Petrucelli, Pleas Koch, PA-C      Allergies    Patient has no known allergies.    Review of Systems   Review of Systems  Gastrointestinal:  Positive for abdominal pain.    Physical Exam Updated Vital Signs BP 135/72   Pulse 80   Temp 98.2 F (36.8 C) (Oral)   Resp 19   SpO2 97%  Physical Exam Vitals reviewed.  Constitutional:      General: She is not in acute distress.  Comments: Resting comfortably  HENT:     Head: Normocephalic and atraumatic.  Eyes:     Extraocular Movements: Extraocular movements intact.     Conjunctiva/sclera: Conjunctivae normal.     Pupils: Pupils are equal, round, and reactive to light.  Cardiovascular:     Rate and Rhythm: Normal rate and regular rhythm.     Pulses: Normal pulses.     Heart sounds: Normal heart sounds.     Comments: 2+ bilateral radial/dorsalis pedis pulses with regular rate Pulmonary:     Effort: Pulmonary effort is normal. No respiratory distress.     Breath sounds: Normal breath sounds.  Abdominal:     General: There is no distension.     Palpations: Abdomen is soft.     Tenderness: There is abdominal  tenderness (Suprapubic, right lower quadrant). There is no right CVA tenderness, left CVA tenderness, guarding or rebound.  Musculoskeletal:        General: Normal range of motion.     Cervical back: Normal range of motion and neck supple.     Comments: 5 out of 5 bilateral grip/leg extension strength  Skin:    General: Skin is warm and dry.     Capillary Refill: Capillary refill takes less than 2 seconds.  Neurological:     General: No focal deficit present.     Mental Status: She is alert and oriented to person, place, and time.     Comments: Sensation intact in all 4 limbs  Psychiatric:        Mood and Affect: Mood normal.     ED Results / Procedures / Treatments   Labs (all labs ordered are listed, but only abnormal results are displayed) Labs Reviewed  COMPREHENSIVE METABOLIC PANEL - Abnormal; Notable for the following components:      Result Value   Potassium 3.4 (*)    Glucose, Bld 305 (*)    All other components within normal limits  URINALYSIS, ROUTINE W REFLEX MICROSCOPIC - Abnormal; Notable for the following components:   APPearance CLOUDY (*)    Glucose, UA >=500 (*)    Hgb urine dipstick LARGE (*)    Protein, ur 100 (*)    Leukocytes,Ua LARGE (*)    Bacteria, UA MANY (*)    All other components within normal limits  LIPASE, BLOOD  CBC  HCG, SERUM, QUALITATIVE  HIV ANTIBODY (ROUTINE TESTING W REFLEX)  RPR    EKG None  Radiology CT ABDOMEN PELVIS W CONTRAST  Result Date: 02/17/2023 CLINICAL DATA:  47 year old female with right lower quadrant pain. Nausea. EXAM: CT ABDOMEN AND PELVIS WITH CONTRAST TECHNIQUE: Multidetector CT imaging of the abdomen and pelvis was performed using the standard protocol following bolus administration of intravenous contrast. RADIATION DOSE REDUCTION: This exam was performed according to the departmental dose-optimization program which includes automated exposure control, adjustment of the mA and/or kV according to patient size  and/or use of iterative reconstruction technique. CONTRAST:  OMNIPAQUE IOHEXOL 350 MG/ML SOLN COMPARISON:  CT Abdomen and Pelvis 11/30/2022. FINDINGS: Lower chest: Negative; improved lung base atelectasis since June. Hepatobiliary: Stable, negative. Pancreas: Negative. Spleen: Negative. Adrenals/Urinary Tract: Non incidental bilateral adrenal gland nodularity is stable since 10/31/2021 suggesting multiple 9 adrenal adenomas up to about 2 cm individually (no follow-up imaging recommended). Nonobstructed kidneys. Chronic left renal upper pole cyst with simple fluid density (no follow-up imaging recommended). Symmetric renal enhancement. No nephrolithiasis or convincing renal inflammation today. Mild cortical irregularity along the right lateral renal upper pole  corresponds to areas of heterogeneous enhancement in June (series 6, image 65). Decompressed ureters. However, severe urinary bladder wall thickening and heterogeneity (series 3, image 82) which is new from last year. The bladder was decompressed in June. Bladder wall mucosal hyperenhancement on coronal image 65. Incidental pelvic phleboliths. Stomach/Bowel: Negative rectum. Extensive diverticulosis of the descending colon. Redundant proximal sigmoid colon is mostly gas containing, with glass diverticula. Occasional diverticula in the transverse colon and right colon. No large bowel inflammation identified. Normal retrocecal appendix on series 3, image 68. No dilated small bowel. Stomach and duodenum appear negative. No free air or free fluid. Vascular/Lymphatic: Suboptimal intravascular contrast bolus but the major arterial structures appear patent. Portal venous system appears to be patent. Calcified bilateral iliac artery atherosclerosis. No lymphadenopathy identified. Reproductive: Negative. Other: No pelvis free fluid. 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 No acute osseous abnormality  identified. IMPRESSION: 1. Pronounced bladder wall thickening, heterogeneity, and bladder mucosal hyperenhancement. These findings are new from 2023, with top differential considerations of Acute Cystitis and UTI. 2. No other acute or inflammatory process identified in the abdomen or pelvis. Normal appendix. Extensive colonic diverticulosis. Age advanced lumbar spine degeneration. Electronically Signed   By: Odessa Fleming M.D.   On: 02/17/2023 06:16    Procedures Procedures    Medications Ordered in ED Medications  fosfomycin (MONUROL) packet 3 g (has no administration in time range)  acetaminophen (TYLENOL) tablet 650 mg (650 mg Oral Given 02/17/23 0530)  iohexol (OMNIPAQUE) 350 MG/ML injection 100 mL (100 mLs Intravenous Contrast Given 02/17/23 0556)    ED Course/ Medical Decision Making/ A&P                                 Medical Decision Making Amount and/or Complexity of Data Reviewed Labs: ordered. Radiology: ordered.  Risk OTC drugs. Prescription drug management.   Claudina Lick 47 y.o. presented today for lower abdominal pain. Working DDx that I considered at this time includes, but not limited to, gastroenteritis, colitis, small bowel obstruction, appendicitis, cholecystitis, hepatobiliary pathology, gastritis, PUD, ACS, aortic dissection pancreatitis, nephrolithiasis, AAA, UTI, pyelonephritis, ruptured ectopic pregnancy, PID, ovarian torsion.  R/o DDx: gastroenteritis, colitis, small bowel obstruction, appendicitis, cholecystitis, hepatobiliary pathology, gastritis, PUD, ACS, aortic dissection pancreatitis, nephrolithiasis, AAA, pyelonephritis, ruptured ectopic pregnancy, PID, ovarian torsion: These are considered less likely due to history of present illness, physical exam, labs/imaging findings.  Review of prior external notes: 01/16/2023 ED  Unique Tests and My Interpretation:  CBC with differential: Unremarkable CMP: Hyperglycemia 305 however this is down from  baseline Lipase: Unremarkable UA: Large leukocytes, many bacteria Serum hCG: Negative HIV: Nonreactive RPR: Pending CT Abd/Pelvis with contrast: Acute cystitis  Discussion with Independent Historian: None  Discussion of Management of Tests: None  Risk: Medium: prescription drug management  Risk Stratification Score: None  Plan: On exam patient was no acute distress stable vitals.  Patient was tender in the suprapubic and right lower quadrant on exam states she still has her gallbladder.  Patient's labs from triage are reassuring however her urinalysis does appear to have UTI.  Given patient's tenderness on exam we will proceed with CT scan.  CT came back showing cystitis and will treat with 1 dose of fosfomycin has patient is homeless and does have trouble getting medications in the past.  This is uncomplicated UTI and patient is not having urosepsis. Triage note states that patient  requested STI screening however when I spoke to her patient did not endorse this and so we did not add any STI screening to the labs today.  Patient was given return precautions. Patient stable for discharge at this time.  Patient verbalized understanding of plan.         Final Clinical Impression(s) / ED Diagnoses Final diagnoses:  Acute cystitis with hematuria    Rx / DC Orders ED Discharge Orders     None         Remi Deter 02/17/23 1027    Nira Conn, MD 02/18/23 [phone]

## 2023-02-27 ENCOUNTER — Encounter (HOSPITAL_COMMUNITY): Payer: Self-pay | Admitting: *Deleted

## 2023-02-27 ENCOUNTER — Emergency Department (HOSPITAL_COMMUNITY)
Admission: EM | Admit: 2023-02-27 | Discharge: 2023-02-28 | Disposition: A | Discharge status: Home / Self Care | Payer: MEDICAID | Attending: Emergency Medicine | Admitting: Emergency Medicine

## 2023-02-27 ENCOUNTER — Emergency Department (HOSPITAL_COMMUNITY): Payer: MEDICAID

## 2023-02-27 ENCOUNTER — Other Ambulatory Visit: Payer: Self-pay

## 2023-02-27 DIAGNOSIS — Z5900 Homelessness unspecified: Secondary | ICD-10-CM | POA: Insufficient documentation

## 2023-02-27 DIAGNOSIS — R52 Pain, unspecified: Secondary | ICD-10-CM | POA: Diagnosis present

## 2023-02-27 DIAGNOSIS — Z20822 Contact with and (suspected) exposure to covid-19: Secondary | ICD-10-CM | POA: Insufficient documentation

## 2023-02-27 DIAGNOSIS — Z7984 Long term (current) use of oral hypoglycemic drugs: Secondary | ICD-10-CM | POA: Insufficient documentation

## 2023-02-27 DIAGNOSIS — J45909 Unspecified asthma, uncomplicated: Secondary | ICD-10-CM | POA: Insufficient documentation

## 2023-02-27 DIAGNOSIS — B349 Viral infection, unspecified: Secondary | ICD-10-CM | POA: Diagnosis not present

## 2023-02-27 LAB — CBC
HCT: 43.2 % (ref 36.0–46.0)
Hemoglobin: 13.6 g/dL (ref 12.0–15.0)
MCH: 27.6 pg (ref 26.0–34.0)
MCHC: 31.5 g/dL (ref 30.0–36.0)
MCV: 87.8 fL (ref 80.0–100.0)
Platelets: 202 10*3/uL (ref 150–400)
RBC: 4.92 MIL/uL (ref 3.87–5.11)
RDW: 13.9 % (ref 11.5–15.5)
WBC: 7.7 10*3/uL (ref 4.0–10.5)
nRBC: 0 % (ref 0.0–0.2)

## 2023-02-27 LAB — URINALYSIS, ROUTINE W REFLEX MICROSCOPIC
Bilirubin Urine: NEGATIVE
Glucose, UA: >=500 mg/dL — AB
Ketones, ur: NEGATIVE mg/dL
Nitrite: POSITIVE — AB
Protein, ur: 30 mg/dL — AB
Specific Gravity, Urine: 1.015 (ref 1.005–1.030)
WBC, UA: >50 WBC/hpf (ref 0–5)
pH: 5 (ref 5.0–8.0)

## 2023-02-27 LAB — TROPONIN I (HIGH SENSITIVITY): Troponin I (High Sensitivity): 8 ng/L (ref <18)

## 2023-02-27 LAB — COMPREHENSIVE METABOLIC PANEL
ALT: 17 U/L (ref 0–44)
AST: 17 U/L (ref 15–41)
Albumin: 3.6 g/dL (ref 3.5–5.0)
Alkaline Phosphatase: 52 U/L (ref 38–126)
Anion gap: 11 (ref 5–15)
BUN: 6 mg/dL (ref 6–20)
CO2: 24 mmol/L (ref 22–32)
Calcium: 10.4 mg/dL — ABNORMAL HIGH (ref 8.9–10.3)
Chloride: 108 mmol/L (ref 98–111)
Creatinine, Ser: 0.6 mg/dL (ref 0.44–1.00)
GFR, Estimated: >60 mL/min (ref >60)
Glucose, Bld: 210 mg/dL — ABNORMAL HIGH (ref 70–99)
Potassium: 3.7 mmol/L (ref 3.5–5.1)
Sodium: 143 mmol/L (ref 135–145)
Total Bilirubin: 0.3 mg/dL (ref 0.3–1.2)
Total Protein: 6.9 g/dL (ref 6.5–8.1)

## 2023-02-27 LAB — LIPASE, BLOOD: Lipase: 31 U/L (ref 11–51)

## 2023-02-27 LAB — RESP PANEL BY RT-PCR (RSV, FLU A&B, COVID)  RVPGX2
Influenza A by PCR: NEGATIVE
Influenza B by PCR: NEGATIVE
Resp Syncytial Virus by PCR: NEGATIVE
SARS Coronavirus 2 by RT PCR: NEGATIVE

## 2023-02-27 LAB — HCG, SERUM, QUALITATIVE: Preg, Serum: NEGATIVE

## 2023-02-27 NOTE — ED Notes (Signed)
Pt unable to void at this time. 

## 2023-02-27 NOTE — ED Triage Notes (Signed)
For 48 hours the ot has had generalized pain with a sorethroat  productive cough  unknown temp   runny nose  she thinks she may have covid  lmp irrregular  iub

## 2023-02-28 LAB — TROPONIN I (HIGH SENSITIVITY): Troponin I (High Sensitivity): 13 ng/L (ref <18)

## 2023-02-28 LAB — GROUP A STREP BY PCR: Group A Strep by PCR: NOT DETECTED

## 2023-02-28 MED ORDER — KETOROLAC TROMETHAMINE 15 MG/ML IJ SOLN
15.0000 mg | Freq: Once | INTRAMUSCULAR | Status: AC
  Administered 2023-02-28: 15 mg via INTRAMUSCULAR
  Filled 2023-02-28: qty 1

## 2023-02-28 MED ORDER — BENZONATATE 100 MG PO CAPS: 100.0000 mg | ORAL_CAPSULE | Freq: Three times a day (TID) | ORAL | 0 refills | Status: DC | PRN

## 2023-02-28 MED ORDER — ALBUTEROL SULFATE HFA 108 (90 BASE) MCG/ACT IN AERS
2.0000 | INHALATION_SPRAY | RESPIRATORY_TRACT | Status: DC | PRN
  Filled 2023-02-28: qty 6.7

## 2023-02-28 NOTE — ED Provider Notes (Signed)
[facility]MENT AT [facility] Provider Note   CSN: [id number] Arrival date & time: 02/27/23  2038     History  Chief Complaint  Patient presents with   Generalized Body Aches    Julie [name] is a 47 y.o. female.  47 year old female presents to the emergency department for evaluation of generalized myalgias as well as sore throat.  Her symptoms have been persistent over the past few days.  She reports worsening pain with swallowing.  Sore throat is also aggravated with cough.  Cough productive of sputum without hemoptysis.  She reports subjective fever, but has not taken her temperature.  No medications taken prior to arrival for symptoms.  Last use of crack cocaine was 2 days ago.  Denies any concern for oral STDs.  She was previously seen 10 days ago in the ED and treated with [facility] for presumed UTI.  She has not been experiencing any ongoing dysuria, urinary frequency or urgency.  No vaginal bleeding or discharge.  The history is provided by the patient. No language interpreter was used.       Home Medications Prior to Admission medications   Medication Sig Start Date End Date Taking? Authorizing Provider  benzonatate (TESSALON) 100 MG capsule Take 1 capsule (100 mg total) by mouth 3 (three) times daily as needed for cough. 02/28/23  Yes Antony Madura, PA-C  metFORMIN (GLUCOPHAGE) 500 MG tablet Take 1 tablet (500 mg total) by mouth 2 (two) times daily with a meal. 12/01/22   Smoot, Sarah A, PA-C  miconazole (MICOTIN) 2 % cream Apply 1 Application topically 2 (two) times daily. Apply in your vagina and around the outside of your vagina twice a day 12/22/22   Bethann Berkshire, MD      Allergies    Patient has no known allergies.    Review of Systems   Review of Systems Ten systems reviewed and are negative for acute change, except as noted in the HPI.    Physical Exam Updated Vital Signs BP (!) 158/96   Pulse 87   Temp 99.1 F (37.3 C)   Resp 17    Ht 5\' 11"  (1.803 m)   Wt 129.3 kg   SpO2 98%   BMI 39.76 kg/m   Physical Exam Vitals and nursing note reviewed.  Constitutional:      General: She is not in acute distress.    Appearance: She is well-developed. She is not diaphoretic.     Comments: Disheveled, obese AA female  HENT:     Head: Normocephalic and atraumatic.     Mouth/Throat:     Comments: Mild tonsillar hypertrophy b/l without exudates. No posterior oropharyngeal edema. Normal phonation. No trismus or malocclusion. Tolerating secretions w/o difficulty. Eyes:     General: No scleral icterus.    Extraocular Movements: EOM normal.     Conjunctiva/sclera: Conjunctivae normal.  Cardiovascular:     Rate and Rhythm: Normal rate and regular rhythm.     Pulses: Normal pulses.  Pulmonary:     Effort: Pulmonary effort is normal. No respiratory distress.     Breath sounds: No rhonchi or rales.     Comments: Expiratory wheeze throughout; mild. C/w smoking hx. No tachypnea or dyspnea. Abdominal:     Palpations: Abdomen is soft.     Tenderness: There is no abdominal tenderness.  Musculoskeletal:        General: Normal range of motion.     Cervical back: Normal range of motion.  Skin:  General: Skin is warm and dry.     Coloration: Skin is not pale.     Findings: No erythema or rash.  Neurological:     Mental Status: She is alert and oriented to person, place, and time.     Coordination: Coordination normal.  Psychiatric:        Mood and Affect: Mood and affect normal.        Behavior: Behavior normal.     ED Results / Procedures / Treatments   Labs (all labs ordered are listed, but only abnormal results are displayed) Labs Reviewed  COMPREHENSIVE METABOLIC PANEL - Abnormal; Notable for the following components:      Result Value   Glucose, Bld 210 (*)    Calcium 10.4 (*)    All other components within normal limits  URINALYSIS, ROUTINE W REFLEX MICROSCOPIC - Abnormal; Notable for the following components:    APPearance CLOUDY (*)    Glucose, UA >=500 (*)    Hgb urine dipstick SMALL (*)    Protein, ur 30 (*)    Nitrite POSITIVE (*)    Leukocytes,Ua LARGE (*)    Bacteria, UA MANY (*)    All other components within normal limits  RESP PANEL BY RT-PCR (RSV, FLU A&B, COVID)  RVPGX2  GROUP A STREP BY PCR  URINE CULTURE  LIPASE, BLOOD  CBC  HCG, SERUM, QUALITATIVE  GC/CHLAMYDIA PROBE AMP ([place]) NOT AT [facility]  TROPONIN I (HIGH SENSITIVITY)  TROPONIN I (HIGH SENSITIVITY)    EKG EKG Interpretation Date/Time:  Sunday February 27 2023 20:56:30 EDT Ventricular Rate:  91 PR Interval:  132 QRS Duration:  90 QT Interval:  372 QTC Calculation: 457 R Axis:   -50  Text Interpretation: Normal sinus rhythm Left anterior fascicular block Abnormal QRS-T angle, consider primary T wave abnormality Abnormal ECG Confirmed by Zadie Rhine ([id number]) on 02/28/2023 5:23:05 AM  Radiology DG Chest 2 View  Result Date: 02/27/2023 CLINICAL DATA:  Chest tightness. Cough and upper respiratory infection. EXAM: CHEST - 2 VIEW COMPARISON:  Chest radiograph dated 11/11/2022. FINDINGS: No focal consolidation, pleural effusion, or pneumothorax. Stable cardiac silhouette. No acute osseous pathology. IMPRESSION: No active cardiopulmonary disease. Electronically Signed   By: Elgie Collard M.D.   On: 02/27/2023 21:50    Procedures Procedures    Medications Ordered in ED Medications  albuterol (VENTOLIN HFA) 108 (90 Base) MCG/ACT inhaler 2 puff (has no administration in time range)  ketorolac (TORADOL) 15 MG/ML injection 15 mg (has no administration in time range)    ED Course/ Medical Decision Making/ A&P Clinical Course as of 02/28/23 0537  Mon Feb 28, 2023  0431 Prior imaging reviewed including abdominal CT from 02/17/23. Findings at this time suggestive of acute cystitis. No evidence of obstructive uropathy or kidney stone. [KH]  0525 Upon discussion with the patient, she denies any urinary symptoms  including dysuria, urgency, frequency.  On chart review her urinalysis consistently appears infectious.  Unfortunately, it has not often been sent for urine culture.  Her culture in June showed multiple species with suggestion for recollection.  I have added on a urine culture as well as urine gonorrhea and chlamydia testing.  Seeing that she is asymptomatic without leukocytosis, fever, other criteria for SIRS/sepsis, will hold additional treatment until urine culture results.  Will continue to manage URI symptom complaints, encourage outpatient primary care follow-up. [KH]    Clinical Course User Index [KH] Antony Madura, PA-C  Medical Decision Making Amount and/or Complexity of Data Reviewed Labs: ordered.  Risk Prescription drug management.   This patient presents to the ED for concern of myalgias, sore throat, and cough, this involves an extensive number of treatment options, and is a complaint that carries with it a high risk of complications and morbidity.  The differential diagnosis includes viral URI vs strep throat vs PNA vs pericarditis vs sepsis   Co morbidities that complicate the patient evaluation  Bronchitis Asthma Mental health problem   Additional history obtained:  External records from outside source obtained and reviewed including CT abdomen/pelvis from 02/17/23   Lab Tests:  I Ordered, and personally interpreted labs.  The pertinent results include:  UA with bacteriuria, pyuria, hematuria, nitrite positive. Aside from nitrites, these appear to be chronic changes for the patient. Flu and COVID negative. Negative strep screen. WBC 7.7. Trop 8>13.   Imaging Studies ordered:  I ordered imaging studies including CXR  I independently visualized and interpreted imaging which showed no acute cardiopulmonary abnormality I agree with the radiologist interpretation   Cardiac Monitoring:  The patient was maintained on a cardiac  monitor.  I personally viewed and interpreted the cardiac monitored which showed an underlying rhythm of: NSR   Medicines ordered and prescription drug management:  I ordered medication including albuterol MDI for cough/wheezing.  Reevaluation of the patient after these medicines showed that the patient improved I have reviewed the patients home medicines and have made adjustments as needed   Test Considered:  Oral GC/Chlamydia   Problem List / ED Course:  As above   Reevaluation:  After the interventions noted above, I reevaluated the patient and found that they have :stayed the same   Social Determinants of Health:  Homeless    Dispostion:  After consideration of the diagnostic results and the patients response to treatment, I feel that the patent would benefit from outpatient PCP follow up. Given supportive care instructions for presumed viral URI. Return precautions discussed and provided. Patient discharged in stable condition with no unaddressed concerns.          Final Clinical Impression(s) / ED Diagnoses Final diagnoses:  Viral syndrome    Rx / DC Orders ED Discharge Orders          Ordered    benzonatate (TESSALON) 100 MG capsule  3 times daily PRN        02/28/23 0530              Antony Madura, PA-C 02/28/23 0542    Zadie Rhine, MD 02/28/23 [phone]

## 2023-02-28 NOTE — Discharge Instructions (Addendum)
Your evaluation in the ED was reassuring. Your symptoms are suggestive of a viral illness. Take tylenol for fever and ibuprofen for headaches, body aches, sore throat. You have been prescribed Tessalon for cough. Continue 1-2 puffs of an albuterol inhaler every 4-6 hours for cough, wheezing, or shortness of breath. You may continue to use other over-the-counter remedies for symptom control, if desired. Follow up with a primary care doctor.

## 2023-03-01 LAB — GC/CHLAMYDIA PROBE AMP (~~LOC~~) NOT AT ARMC
Chlamydia: NEGATIVE
Comment: NEGATIVE
Comment: NORMAL
Neisseria Gonorrhea: NEGATIVE

## 2023-03-16 ENCOUNTER — Other Ambulatory Visit: Payer: Self-pay

## 2023-03-16 ENCOUNTER — Emergency Department (HOSPITAL_COMMUNITY)
Admission: EM | Admit: 2023-03-16 | Discharge: 2023-03-18 | Disposition: A | Discharge status: Other Acute Inpatient Hospital | Payer: MEDICAID | Attending: Emergency Medicine | Admitting: Emergency Medicine

## 2023-03-16 ENCOUNTER — Encounter (HOSPITAL_COMMUNITY): Payer: Self-pay

## 2023-03-16 DIAGNOSIS — F23 Brief psychotic disorder: Secondary | ICD-10-CM | POA: Insufficient documentation

## 2023-03-16 DIAGNOSIS — F32A Depression, unspecified: Secondary | ICD-10-CM | POA: Diagnosis not present

## 2023-03-16 DIAGNOSIS — R45851 Suicidal ideations: Secondary | ICD-10-CM | POA: Insufficient documentation

## 2023-03-16 DIAGNOSIS — Z5900 Homelessness unspecified: Secondary | ICD-10-CM | POA: Diagnosis not present

## 2023-03-16 DIAGNOSIS — F1410 Cocaine abuse, uncomplicated: Secondary | ICD-10-CM | POA: Insufficient documentation

## 2023-03-16 DIAGNOSIS — F17210 Nicotine dependence, cigarettes, uncomplicated: Secondary | ICD-10-CM | POA: Insufficient documentation

## 2023-03-16 DIAGNOSIS — I10 Essential (primary) hypertension: Secondary | ICD-10-CM

## 2023-03-16 DIAGNOSIS — E1165 Type 2 diabetes mellitus with hyperglycemia: Secondary | ICD-10-CM | POA: Diagnosis not present

## 2023-03-16 DIAGNOSIS — F332 Major depressive disorder, recurrent severe without psychotic features: Secondary | ICD-10-CM | POA: Diagnosis present

## 2023-03-16 DIAGNOSIS — Z7984 Long term (current) use of oral hypoglycemic drugs: Secondary | ICD-10-CM | POA: Insufficient documentation

## 2023-03-16 DIAGNOSIS — R45850 Homicidal ideations: Secondary | ICD-10-CM | POA: Diagnosis not present

## 2023-03-16 DIAGNOSIS — J45909 Unspecified asthma, uncomplicated: Secondary | ICD-10-CM | POA: Insufficient documentation

## 2023-03-16 DIAGNOSIS — F419 Anxiety disorder, unspecified: Secondary | ICD-10-CM | POA: Diagnosis not present

## 2023-03-16 LAB — RAPID URINE DRUG SCREEN, HOSP PERFORMED
Amphetamines: NOT DETECTED
Barbiturates: NOT DETECTED
Benzodiazepines: NOT DETECTED
Cocaine: POSITIVE — AB
Opiates: NOT DETECTED
Tetrahydrocannabinol: POSITIVE — AB

## 2023-03-16 LAB — COMPREHENSIVE METABOLIC PANEL
ALT: 15 U/L (ref 0–44)
AST: 16 U/L (ref 15–41)
Albumin: 3.9 g/dL (ref 3.5–5.0)
Alkaline Phosphatase: 51 U/L (ref 38–126)
Anion gap: 8 (ref 5–15)
BUN: 7 mg/dL (ref 6–20)
CO2: 23 mmol/L (ref 22–32)
Calcium: 10.1 mg/dL (ref 8.9–10.3)
Chloride: 111 mmol/L (ref 98–111)
Creatinine, Ser: 0.55 mg/dL (ref 0.44–1.00)
GFR, Estimated: >60 mL/min (ref >60)
Glucose, Bld: 228 mg/dL — ABNORMAL HIGH (ref 70–99)
Potassium: 3.3 mmol/L — ABNORMAL LOW (ref 3.5–5.1)
Sodium: 142 mmol/L (ref 135–145)
Total Bilirubin: 0.6 mg/dL (ref 0.3–1.2)
Total Protein: 7.6 g/dL (ref 6.5–8.1)

## 2023-03-16 LAB — CBC WITH DIFFERENTIAL/PLATELET
Abs Immature Granulocytes: 0.01 10*3/uL (ref 0.00–0.07)
Basophils Absolute: 0 10*3/uL (ref 0.0–0.1)
Basophils Relative: 1 %
Eosinophils Absolute: 0.1 10*3/uL (ref 0.0–0.5)
Eosinophils Relative: 1 %
HCT: 44.3 % (ref 36.0–46.0)
Hemoglobin: 13.7 g/dL (ref 12.0–15.0)
Immature Granulocytes: 0 %
Lymphocytes Relative: 44 %
Lymphs Abs: 3.6 10*3/uL (ref 0.7–4.0)
MCH: 27.3 pg (ref 26.0–34.0)
MCHC: 30.9 g/dL (ref 30.0–36.0)
MCV: 88.4 fL (ref 80.0–100.0)
Monocytes Absolute: 0.6 10*3/uL (ref 0.1–1.0)
Monocytes Relative: 7 %
Neutro Abs: 4 10*3/uL (ref 1.7–7.7)
Neutrophils Relative %: 47 %
Platelets: 234 10*3/uL (ref 150–400)
RBC: 5.01 MIL/uL (ref 3.87–5.11)
RDW: 13.5 % (ref 11.5–15.5)
WBC: 8.4 10*3/uL (ref 4.0–10.5)
nRBC: 0 % (ref 0.0–0.2)

## 2023-03-16 LAB — ETHANOL: Alcohol, Ethyl (B): <10 mg/dL (ref <10)

## 2023-03-16 LAB — CBG MONITORING, ED: Glucose-Capillary: 159 mg/dL — ABNORMAL HIGH (ref 70–99)

## 2023-03-16 LAB — HCG, SERUM, QUALITATIVE: Preg, Serum: NEGATIVE

## 2023-03-16 MED ORDER — METFORMIN HCL 500 MG PO TABS
500.0000 mg | ORAL_TABLET | Freq: Two times a day (BID) | ORAL | Status: DC
  Administered 2023-03-17 – 2023-03-18 (×3): 500 mg via ORAL
  Filled 2023-03-16 (×2): qty 1

## 2023-03-16 NOTE — ED Notes (Signed)
Pt was change into purple scrubs and belongings were placed in the nurses station cabinets in 1-8.

## 2023-03-16 NOTE — ED Triage Notes (Signed)
Pt states that she needs somewhere safe to be. Pt reports a hx of SI but states that she does not know if she is currently. Pt states that she doesn't feel that things are going to be ok for her.

## 2023-03-16 NOTE — ED Provider Notes (Signed)
[name] DE[facility] AT [facility] Provider Note   CSN: [id number] Arrival date & time: 03/16/23  1945     History  Chief Complaint  Patient presents with   Psychiatric Evaluation    Julie [name] is a 47 y.o. female.  Pt is a 47 yo female with pmhx significant for diabetes and cocaine abuse.  Pt said she is feeling like there is something wrong with her mind.  She feels suicidal and homicidal, but nothing specific.  She did use cocaine today.         Home Medications Prior to Admission medications   Medication Sig Start Date End Date Taking? Authorizing Provider  benzonatate (TESSALON) 100 MG capsule Take 1 capsule (100 mg total) by mouth 3 (three) times daily as needed for cough. 02/28/23   Antony Madura, PA-C  metFORMIN (GLUCOPHAGE) 500 MG tablet Take 1 tablet (500 mg total) by mouth 2 (two) times daily with a meal. 12/01/22   Smoot, Sarah A, PA-C  miconazole (MICOTIN) 2 % cream Apply 1 Application topically 2 (two) times daily. Apply in your vagina and around the outside of your vagina twice a day 12/22/22   Bethann Berkshire, MD      Allergies    Patient has no known allergies.    Review of Systems   Review of Systems  Psychiatric/Behavioral:  Positive for suicidal ideas. The patient is nervous/anxious.   All other systems reviewed and are negative.   Physical Exam Updated Vital Signs BP (!) 197/108 (BP Location: Left Arm)   Pulse 84   Temp 98.4 F (36.9 C) (Oral)   Resp 18   Ht 5\' 11"  (1.803 m)   Wt 129.3 kg   SpO2 97%   BMI 39.76 kg/m  Physical Exam Vitals and nursing note reviewed.  Constitutional:      Appearance: Normal appearance. She is obese.  HENT:     Head: Normocephalic and atraumatic.     Right Ear: External ear normal.     Left Ear: External ear normal.     Nose: Nose normal.     Mouth/Throat:     Mouth: Mucous membranes are moist.     Pharynx: Oropharynx is clear.  Eyes:     Extraocular Movements: Extraocular  movements intact.     Conjunctiva/sclera: Conjunctivae normal.     Pupils: Pupils are equal, round, and reactive to light.  Cardiovascular:     Rate and Rhythm: Normal rate and regular rhythm.     Pulses: Normal pulses.     Heart sounds: Normal heart sounds.  Pulmonary:     Effort: Pulmonary effort is normal.     Breath sounds: Normal breath sounds.  Abdominal:     General: Abdomen is flat. Bowel sounds are normal.     Palpations: Abdomen is soft.  Musculoskeletal:        General: Normal range of motion.     Cervical back: Normal range of motion and neck supple.  Skin:    General: Skin is warm.     Capillary Refill: Capillary refill takes less than 2 seconds.  Neurological:     General: No focal deficit present.     Mental Status: She is alert and oriented to person, place, and time.  Psychiatric:        Mood and Affect: Mood is anxious.        Speech: Speech is tangential.        Thought Content: Thought content is paranoid.  Thought content includes homicidal and suicidal ideation.     ED Results / Procedures / Treatments   Labs (all labs ordered are listed, but only abnormal results are displayed) Labs Reviewed  COMPREHENSIVE METABOLIC PANEL - Abnormal; Notable for the following components:      Result Value   Potassium 3.3 (*)    Glucose, Bld 228 (*)    All other components within normal limits  RAPID URINE DRUG SCREEN, HOSP PERFORMED - Abnormal; Notable for the following components:   Cocaine POSITIVE (*)    Tetrahydrocannabinol POSITIVE (*)    All other components within normal limits  CBG MONITORING, ED - Abnormal; Notable for the following components:   Glucose-Capillary 159 (*)    All other components within normal limits  ETHANOL  CBC WITH DIFFERENTIAL/PLATELET  HCG, SERUM, QUALITATIVE    EKG None  Radiology No results found.  Procedures Procedures    Medications Ordered in ED Medications  metFORMIN (GLUCOPHAGE) tablet 500 mg (has no  administration in time range)    ED Course/ Medical Decision Making/ A&P                                 Medical Decision Making Amount and/or Complexity of Data Reviewed Labs: ordered.  Risk Prescription drug management.   This patient presents to the ED for concern of si/hi, this involves an extensive number of treatment options, and is a complaint that carries with it a high risk of complications and morbidity.  The differential diagnosis includes psych, drug   Co morbidities that complicate the patient evaluation  diabetes and cocaine abuse   Additional history obtained:  Additional history obtained from epic chart review  Lab Tests:  I Ordered, and personally interpreted labs.  The pertinent results include:  cbc nl, cmp nl other than glucose 228, uds + cocaine and mj, etoh neg   Medicines ordered and prescription drug management:   I have reviewed the patients home medicines and have made adjustments as needed    Critical Interventions:  TTS consult   Consultations Obtained:  I requested consultation with TTS,  and discussed lab and imaging findings as well as pertinent plan - consult pending   Problem List / ED Course:  SI?HI:  sx are vague.  No specific plan.  She is voluntary.  She is willing to stay for TTS consult.   Reevaluation:  After the interventions noted above, I reevaluated the patient and found that they have :improved   Social Determinants of Health:  Lives at home   Dispostion:  After consideration of the diagnostic results and the patients response to treatment, I feel that the patent would benefit from TTS eval.          Final Clinical Impression(s) / ED Diagnoses Final diagnoses:  Cocaine abuse (HCC)  Depression, unspecified depression type    Rx / DC Orders ED Discharge Orders     None         Jacalyn Lefevre, MD 03/16/23 2339

## 2023-03-17 ENCOUNTER — Encounter (HOSPITAL_COMMUNITY): Payer: Self-pay | Admitting: Psychiatric/Mental Health

## 2023-03-17 DIAGNOSIS — F332 Major depressive disorder, recurrent severe without psychotic features: Secondary | ICD-10-CM | POA: Diagnosis not present

## 2023-03-17 DIAGNOSIS — F32A Depression, unspecified: Secondary | ICD-10-CM

## 2023-03-17 DIAGNOSIS — F1410 Cocaine abuse, uncomplicated: Secondary | ICD-10-CM

## 2023-03-17 LAB — URINALYSIS, COMPLETE (UACMP) WITH MICROSCOPIC
Bilirubin Urine: NEGATIVE
Glucose, UA: NEGATIVE mg/dL
Ketones, ur: NEGATIVE mg/dL
Nitrite: NEGATIVE
Protein, ur: NEGATIVE mg/dL
RBC / HPF: >50 RBC/hpf (ref 0–5)
Specific Gravity, Urine: 1.013 (ref 1.005–1.030)
WBC, UA: >50 WBC/hpf (ref 0–5)
pH: 7 (ref 5.0–8.0)

## 2023-03-17 LAB — CBG MONITORING, ED
Glucose-Capillary: 232 mg/dL — ABNORMAL HIGH (ref 70–99)
Glucose-Capillary: 185 mg/dL — ABNORMAL HIGH (ref 70–99)

## 2023-03-17 MED ORDER — HYDROXYZINE HCL 25 MG PO TABS
25.0000 mg | ORAL_TABLET | Freq: Three times a day (TID) | ORAL | Status: DC | PRN
  Administered 2023-03-17: 25 mg via ORAL
  Filled 2023-03-17: qty 1

## 2023-03-17 MED ORDER — ACETAMINOPHEN 325 MG PO TABS: 650.0000 mg | ORAL_TABLET | Freq: Four times a day (QID) | ORAL | Status: DC | PRN

## 2023-03-17 MED ORDER — AMLODIPINE BESYLATE 5 MG PO TABS
5.0000 mg | ORAL_TABLET | Freq: Every day | ORAL | Status: DC
  Administered 2023-03-17 – 2023-03-18 (×2): 5 mg via ORAL
  Filled 2023-03-17 (×2): qty 1

## 2023-03-17 MED ORDER — ESCITALOPRAM OXALATE 10 MG PO TABS
5.0000 mg | ORAL_TABLET | Freq: Every day | ORAL | Status: DC
  Administered 2023-03-17 – 2023-03-18 (×2): 5 mg via ORAL
  Filled 2023-03-17 (×2): qty 1

## 2023-03-17 NOTE — ED Notes (Signed)
Glucose: 232

## 2023-03-17 NOTE — Consult Note (Signed)
[facility] Consult Note  Patient Name: Julie [name] MRN: [id number] DOB: September 05, 1975 DATE OF Consult: 03/17/2023  PRIMARY PSYCHIATRIC DIAGNOSES  1.  Depressive Disorder, Unspecified  2.  Suicidal Ideations 3.  Homicidal Ideations 4.  Homelessness   RECOMMENDATIONS  Recommendations: Medication recommendations: May provide Zyprexa 10mg  PO/IM Q6H PRN for acute agitation. No scheduled medication indicated at this time. Non-Medication/therapeutic recommendations: Clinical suspicion of secondary gain given patient's uncooperativeness with assessment, behaviors, and responses she does provide. She is very vague with her symptomatology and unwilling to discuss details. Therefore, will plan to have in-house psychiatry follow-up when she is more cooperative.  Is inpatient psychiatric hospitalization recommended for this patient? No (Explain why): At this time, patient is uncooperative with assessment. Will allow to remain VOL at this time. In-house psychiatry to re-assess for disposition.  From a psychiatric perspective, is this patient appropriate for discharge to an outpatient setting/resource or other less restrictive environment for continued care?  Yes (Explain why): If patient wishes to discharge from hospital, she may do so. I have reviewed her chart and found no recent suicide attempts nor psychiatric encounters. Per ED physician documentation her suicidality was vague and not forthcoming which is congruent with my assessment too. Suspect secondary gain.  Communication: Treatment team members (and family members if applicable) who were involved in treatment/care discussions and planning, and with whom we spoke or engaged with via secure text/chat, include the following: Discussed with Dr. Pecola Leisure and ED primary care team.  Thank you for involving [place] in the care of this patient. If you have any additional questions or concerns, please call [phone] and ask for me or the provider on-call.   TELEPSYCHIATRY ATTESTATION & CONSENT  As the provider for this telehealth consult, I attest that I verified the patient's identity using two separate identifiers, introduced myself to the patient, provided my credentials, disclosed my location, and performed this encounter via a HIPAA-compliant, real-time, face-to-face, two-way, interactive audio and video platform and with the full consent and agreement of the patient (or guardian as applicable.)  Patient physical location: ED in [facility]. Telehealth provider physical location: home office in state of [place].  Video start time: 0415 (Central Time) Video end time: 0430 (Central Time)  IDENTIFYING DATA  NOTNAMED PAYNE is a 47 y.o. year-old female for whom a psychiatric consultation has been ordered by the primary provider. The patient was identified using two separate identifiers.  CHIEF COMPLAINT/REASON FOR CONSULT  Not feeling safe, Suicidal Ideations, Homicidal Ideations  HISTORY OF PRESENT ILLNESS (HPI)  The patient presented to the emergency department with chief complaint of not feeling safe with vague reports of suicidal and homicidal ideations. Patient VOL.  Patient evaluated in the hallway. She is initially calm and cooperative, alert and oriented. She does appear to have a bit of a theatrical demeanor to her. Responses seem a bit exaggerated at times. She states prior to arrival she was feeling "all sorts of things, overwhelmed, suicidal, homicidal, I don't know. I was feeling so many different feelings, didn't feel safe". When asked to specify her feeling she replies "I don't know". She becomes defensive as this Clinical research associate asks her to elaborate on her responses. States she is having relationship issues with a female individual and "putting hands on each other" along with homelessness. She also mentions she has been "dibbling and dabbling with cocaine". Last used yesterday morning. Smokes cocaine frequently, 2-3 times per  week. Patient continues to say she came to the hospital  for help because she wasn't feeling safe. When asked to specify reason for not feeling safe patient replies "I don't want to rehash it". When asked about suicidality she replies "I don't know". She does mention at one point she wanted to kill this female individual more than kill herself. She states she has thought of plans but "I'm not saying". She won't identify this person either. Patient remains vague, fleeting eye contact and becoming somewhat irritable the more questions asked. She continues to respond "I need help". When asked how we can help her today she replies "I don't know". Patient then states "I don't need this". Assessment terminated at this time.      PAST PSYCHIATRIC HISTORY  Past psychiatric history of cocaine abuse.  Past psychiatric hospitalization found in Epic 02/2022 attended [facility] No current outpatient treatment No psychotropic medications currently  Otherwise as per HPI above.  PAST MEDICAL HISTORY  Past Medical History:  Diagnosis Date   Asthma    Fracture, humerus closed, shaft 07/11/2014   left   History of bronchitis    Mental health problem    Open fracture of great toe of left foot 12/25/2015     HOME MEDICATIONS  Facility Ordered Medications  Medication   metFORMIN (GLUCOPHAGE) tablet 500 mg   PTA Medications  Medication Sig   metFORMIN (GLUCOPHAGE) 500 MG tablet Take 1 tablet (500 mg total) by mouth 2 (two) times daily with a meal.   miconazole (MICOTIN) 2 % cream Apply 1 Application topically 2 (two) times daily. Apply in your vagina and around the outside of your vagina twice a day   benzonatate (TESSALON) 100 MG capsule Take 1 capsule (100 mg total) by mouth 3 (three) times daily as needed for cough.     ALLERGIES  No Known Allergies  SOCIAL & SUBSTANCE USE HISTORY  Social History   Socioeconomic History   Marital status: Single    Spouse name: Not on file   Number of children: Not  on file   Years of education: Not on file   Highest education level: Not on file  Occupational History   Not on file  Tobacco Use   Smoking status: Every Day    Current packs/day: 0.50    Average packs/day: 0.5 packs/day for 18.0 years (9.0 ttl pk-yrs)    Types: Cigarettes   Smokeless tobacco: Never   Tobacco comments:    6 cig./day  Vaping Use   Vaping status: Never Used  Substance and Sexual Activity   Alcohol use: Yes    Comment: occasionally   Drug use: Yes    Types: Marijuana, "Crack" cocaine, MDMA (Ecstacy)    Comment: Crack, THC, Molly   Sexual activity: Yes    Birth control/protection: I.U.D.  Other Topics Concern   Not on file  Social History Narrative   Not on file   Social Determinants of Health   Financial Resource Strain: Not on file  Food Insecurity: Food Insecurity Present (03/05/2019)   Hunger Vital Sign    Worried About Running Out of Food in the Last Year: Often true    Ran Out of Food in the Last Year: Often true  Transportation Needs: Unmet Transportation Needs (03/05/2019)   PRAPARE - Administrator, Civil Service (Medical): Yes    Lack of Transportation (Non-Medical): Yes  Physical Activity: Not on file  Stress: Not on file  Social Connections: Not on file   Social History   Tobacco Use  Smoking Status  Every Day   Current packs/day: 0.50   Average packs/day: 0.5 packs/day for 18.0 years (9.0 ttl pk-yrs)   Types: Cigarettes  Smokeless Tobacco Never  Tobacco Comments   6 cig./day   Social History   Substance and Sexual Activity  Alcohol Use Yes   Comment: occasionally   Social History   Substance and Sexual Activity  Drug Use Yes   Types: Marijuana, "Crack" cocaine, MDMA (Ecstacy)   Comment: Crack, THC, Molly    Additional pertinent information: homelessness   FAMILY HISTORY  History reviewed. No pertinent family history. Family Psychiatric History (if known):  Unknown at this time  MENTAL STATUS EXAM (MSE)   Presentation  General Appearance:  Appropriate for Environment  Eye Contact: Fleeting  Speech: Clear and Coherent  Speech Volume: Normal  Handedness: Right   Mood and Affect  Mood: Irritable; Anxious  Affect: -- Runner, broadcasting/film/video, exaggerated)   Thought Process  Thought Processes: Linear  Descriptions of Associations: Intact  Orientation: Full (Time, Place and Person)  Thought Content: Logical  History of Schizophrenia/Schizoaffective disorder:No data recorded Duration of Psychotic Symptoms:No data recorded Hallucinations:Hallucinations: None  Ideas of Reference: None  Suicidal Thoughts:Suicidal Thoughts: Yes, Passive (vague; will not disclose plan, intent)  Homicidal Thoughts:Homicidal Thoughts: Yes, Passive (vague- will not disclose plan, target, intent)   Sensorium  Memory: Recent Fair  Judgment: Poor  Insight: Poor   Executive Functions  Concentration: Fair  Attention Span: Fair  Recall: [employer] of Knowledge: Fair  Language: Fair   Psychomotor Activity  Psychomotor Activity:Psychomotor Activity: Normal  Assets  Assets: Manufacturing systems engineer; Social Support   Sleep  Sleep:Sleep: Poor   VITALS  Blood pressure (!) 197/108, pulse 84, temperature 98.4 F (36.9 C), temperature source Oral, resp. rate 18, height 5\' 11"  (1.803 m), weight 129.3 kg, SpO2 97%.  LABS  Admission on 03/16/2023  Component Date Value Ref Range Status   Sodium 03/16/2023 142  135 - 145 mmol/L Final   Potassium 03/16/2023 3.3 (L)  3.5 - 5.1 mmol/L Final   Chloride 03/16/2023 111  98 - 111 mmol/L Final   CO2 03/16/2023 23  22 - 32 mmol/L Final   Glucose, Bld 03/16/2023 228 (H)  70 - 99 mg/dL Final   Glucose reference range applies only to samples taken after fasting for at least 8 hours.   BUN 03/16/2023 7  6 - 20 mg/dL Final   Creatinine, Ser 03/16/2023 0.55  0.44 - 1.00 mg/dL Final   Calcium 57/84/6962 10.1  8.9 - 10.3 mg/dL Final   Total  Protein 03/16/2023 7.6  6.5 - 8.1 g/dL Final   Albumin 95/28/4132 3.9  3.5 - 5.0 g/dL Final   AST 44/06/270 16  15 - 41 U/L Final   ALT 03/16/2023 15  0 - 44 U/L Final   Alkaline Phosphatase 03/16/2023 51  38 - 126 U/L Final   Total Bilirubin 03/16/2023 0.6  0.3 - 1.2 mg/dL Final   GFR, Estimated 03/16/2023 >60  >60 mL/min Final   Comment: (NOTE) Calculated using the CKD-EPI Creatinine Equation (2021)    Anion gap 03/16/2023 8  5 - 15 Final   Performed at [facility], [address] [place], [place] [address]   Alcohol, Ethyl (B) 03/16/2023 <10  <10 mg/dL Final   Comment: (NOTE) Lowest detectable limit for serum alcohol is 10 mg/dL.  For medical purposes only. Performed at [facility], [address], [place], [place] [address]    Opiates 03/16/2023 NONE DETECTED  NONE DETECTED Final   Cocaine 03/16/2023 POSITIVE (A)  NONE DETECTED Final   Benzodiazepines 03/16/2023 NONE DETECTED  NONE DETECTED Final   Amphetamines 03/16/2023 NONE DETECTED  NONE DETECTED Final   Tetrahydrocannabinol 03/16/2023 POSITIVE (A)  NONE DETECTED Final   Barbiturates 03/16/2023 NONE DETECTED  NONE DETECTED Final   Comment: (NOTE) DRUG SCREEN FOR MEDICAL PURPOSES ONLY.  IF CONFIRMATION IS NEEDED FOR ANY PURPOSE, NOTIFY LAB WITHIN 5 DAYS.  LOWEST DETECTABLE LIMITS FOR URINE DRUG SCREEN Drug Class                     Cutoff (ng/mL) Amphetamine and metabolites    1000 Barbiturate and metabolites    200 Benzodiazepine                 200 Opiates and metabolites        300 Cocaine and metabolites        300 THC                            50 Performed at [facility], [address], [place], [place] [address]    WBC 03/16/2023 8.4  4.0 - 10.5 K/uL Final   RBC 03/16/2023 5.01  3.87 - 5.11 MIL/uL Final   Hemoglobin 03/16/2023 13.7  12.0 - 15.0 g/dL Final   HCT 44/06/270 44.3  36.0 - 46.0 % Final   MCV 03/16/2023 88.4  80.0 - 100.0 fL Final    MCH 03/16/2023 27.3  26.0 - 34.0 pg Final   MCHC 03/16/2023 30.9  30.0 - 36.0 g/dL Final   RDW 53/66/4403 13.5  11.5 - 15.5 % Final   Platelets 03/16/2023 234  150 - 400 K/uL Final   nRBC 03/16/2023 0.0  0.0 - 0.2 % Final   Neutrophils Relative % 03/16/2023 47  % Final   Neutro Abs 03/16/2023 4.0  1.7 - 7.7 K/uL Final   Lymphocytes Relative 03/16/2023 44  % Final   Lymphs Abs 03/16/2023 3.6  0.7 - 4.0 K/uL Final   Monocytes Relative 03/16/2023 7  % Final   Monocytes Absolute 03/16/2023 0.6  0.1 - 1.0 K/uL Final   Eosinophils Relative 03/16/2023 1  % Final   Eosinophils Absolute 03/16/2023 0.1  0.0 - 0.5 K/uL Final   Basophils Relative 03/16/2023 1  % Final   Basophils Absolute 03/16/2023 0.0  0.0 - 0.1 K/uL Final   Immature Granulocytes 03/16/2023 0  % Final   Abs Immature Granulocytes 03/16/2023 0.01  0.00 - 0.07 K/uL Final   Performed at [facility], [address] [place], [place] [address]   Preg, Serum 03/16/2023 NEGATIVE  NEGATIVE Final   Comment:        THE SENSITIVITY OF THIS METHODOLOGY IS >10 mIU/mL. Performed at [facility], [address], [place], [place] [address]    Glucose-Capillary 03/16/2023 159 (H)  70 - 99 mg/dL Final   Glucose reference range applies only to samples taken after fasting for at least 8 hours.    PSYCHIATRIC REVIEW OF SYSTEMS (ROS)  ROS: Notable for the following relevant positive findings: Review of Systems  Psychiatric/Behavioral:  Positive for depression, substance abuse and suicidal ideas.     Additional findings:      Musculoskeletal: No abnormal movements observed      Gait & Station: Normal      Pain Screening: Denies      Nutrition &  Dental Concerns: Decrease in food intake and/or loss of appetite  RISK FORMULATION/ASSESSMENT  Is the patient experiencing any suicidal or homicidal ideations: Yes       Explain if yes: vague- will not elaborate; unwilling to disclose plan, intent,  targets Protective factors considered for safety management: access to care, willingness to seek treatment   Risk factors/concerns considered for safety management: homelessness  Depression Substance abuse/dependence Barriers to accessing treatment Unmarried  Is there a safety management plan with the patient and treatment team to minimize risk factors and promote protective factors: Yes           Explain: Patient uncooperative with assessment, plan to keep VOL and if agreeable will follow-up with in-house psychiatry today. Clinical suspicion of secondary gain given behavior and responses to assessment.  Is crisis care placement or psychiatric hospitalization recommended: No but will re-assess in the morning by in-house team.      Based on my current evaluation and risk assessment, patient is determined at this time to be at:  Moderate Risk  *RISK ASSESSMENT Risk assessment is a dynamic process; it is possible that this patient's condition, and risk level, may change. This should be re-evaluated and managed over time as appropriate. Please re-consult psychiatric consult services if additional assistance is needed in terms of risk assessment and management. If your team decides to discharge this patient, please advise the patient how to best access emergency psychiatric services, or to call 911, if their condition worsens or they feel unsafe in any way.   Assunta Gambles, NP Telepsychiatry Consult Services

## 2023-03-17 NOTE — ED Notes (Signed)
[facility] called pts sister Gershon Cull Younger) for collateral. Pts sister reports that pt has been struggling with serious drug addiction (primarily crack) for a little more than a year. Previous to her current struggle, pt was "more stable" holding down a job and taking care of her responsibilities. Pts sister feels that since pts involvement with her current partner, her situation has gotten worse with patient becoming homeless and using more.   Pts sister reports that pt sometimes says that she wishes she were dead. Pt had 2 suicide attempts about 10 years ago, one was ingesting pills and the other cutting her wrists. Pt did not seek medical or psychiatric care at the time. Pts sister reports that pt took medication years ago but discontinued because it made her sleepy.   Pts sister said that pt has two grown children, a son who is currently in prison and a daughter. Both children were raised by their grandmother who had legal custody. Pts sister said that pt had tried to stop using drugs before but was not serious about stopping. Pts sister said that she and pts family are supportive of pt efforts to recover. Pts sister would like to updated about her progress and if she is admitted inpatient.   Jacquelynn Cree, [facility]   03/17/23

## 2023-03-17 NOTE — ED Notes (Signed)
One bag of patient belongings was placed in locker 40.
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
## 2023-03-17 NOTE — ED Notes (Signed)
TTS in progress

## 2023-03-17 NOTE — ED Notes (Signed)
Pt belongings are in cabinet above room 1-4 nurse station

## 2023-03-17 NOTE — ED Provider Notes (Signed)
Patient's nurse reported to me that the patient has been hypertensive consistently here with blood pressures between the 170s to 190s.  Patient reports that her blood pressure hand is running high during previous ED visits but she has never seen a primary doctor or been started on blood pressure medication.  Patient reports that she has been having headaches but denies any numbness or weakness, she has no focal neurologic deficits, no chest pain, no signs of endorgan damage on her exam or on lab evaluation.  Patient will be started on amlodipine for blood pressure control and was given Tylenol for her headache.   Rexford Maus, DO 03/17/23 1945

## 2023-03-18 ENCOUNTER — Inpatient Hospital Stay
Admission: AD | Admit: 2023-03-18 | Discharge: 2023-03-21 | DRG: 885 | Disposition: A | Discharge status: Home / Self Care | Payer: MEDICAID | Source: Intra-hospital | Attending: Psychiatry | Admitting: Psychiatry

## 2023-03-18 ENCOUNTER — Encounter: Payer: Self-pay | Admitting: Psychiatry

## 2023-03-18 ENCOUNTER — Other Ambulatory Visit: Payer: Self-pay

## 2023-03-18 DIAGNOSIS — F332 Major depressive disorder, recurrent severe without psychotic features: Secondary | ICD-10-CM | POA: Diagnosis present

## 2023-03-18 DIAGNOSIS — N390 Urinary tract infection, site not specified: Secondary | ICD-10-CM | POA: Diagnosis present

## 2023-03-18 DIAGNOSIS — Z5900 Homelessness unspecified: Secondary | ICD-10-CM

## 2023-03-18 DIAGNOSIS — I10 Essential (primary) hypertension: Secondary | ICD-10-CM | POA: Diagnosis present

## 2023-03-18 DIAGNOSIS — J45909 Unspecified asthma, uncomplicated: Secondary | ICD-10-CM | POA: Diagnosis present

## 2023-03-18 DIAGNOSIS — R45851 Suicidal ideations: Secondary | ICD-10-CM | POA: Diagnosis present

## 2023-03-18 DIAGNOSIS — Z7984 Long term (current) use of oral hypoglycemic drugs: Secondary | ICD-10-CM | POA: Diagnosis not present

## 2023-03-18 DIAGNOSIS — Z79899 Other long term (current) drug therapy: Secondary | ICD-10-CM

## 2023-03-18 DIAGNOSIS — R45850 Homicidal ideations: Secondary | ICD-10-CM | POA: Diagnosis present

## 2023-03-18 DIAGNOSIS — F17210 Nicotine dependence, cigarettes, uncomplicated: Secondary | ICD-10-CM | POA: Diagnosis present

## 2023-03-18 DIAGNOSIS — F1410 Cocaine abuse, uncomplicated: Secondary | ICD-10-CM | POA: Diagnosis not present

## 2023-03-18 DIAGNOSIS — F419 Anxiety disorder, unspecified: Secondary | ICD-10-CM | POA: Diagnosis present

## 2023-03-18 DIAGNOSIS — G4700 Insomnia, unspecified: Secondary | ICD-10-CM | POA: Diagnosis present

## 2023-03-18 DIAGNOSIS — F1420 Cocaine dependence, uncomplicated: Secondary | ICD-10-CM | POA: Diagnosis present

## 2023-03-18 DIAGNOSIS — Z5941 Food insecurity: Secondary | ICD-10-CM

## 2023-03-18 DIAGNOSIS — F4310 Post-traumatic stress disorder, unspecified: Secondary | ICD-10-CM | POA: Diagnosis present

## 2023-03-18 DIAGNOSIS — M545 Low back pain, unspecified: Secondary | ICD-10-CM | POA: Diagnosis present

## 2023-03-18 DIAGNOSIS — Z5982 Transportation insecurity: Secondary | ICD-10-CM | POA: Diagnosis not present

## 2023-03-18 MED ORDER — DIPHENHYDRAMINE HCL 25 MG PO CAPS: 50.0000 mg | ORAL_CAPSULE | Freq: Three times a day (TID) | ORAL | Status: DC | PRN

## 2023-03-18 MED ORDER — AMLODIPINE BESYLATE 5 MG PO TABS
5.0000 mg | ORAL_TABLET | Freq: Every day | ORAL | Status: DC
  Administered 2023-03-19 – 2023-03-21 (×3): 5 mg via ORAL
  Filled 2023-03-18 (×3): qty 1

## 2023-03-18 MED ORDER — HYDROXYZINE HCL 25 MG PO TABS
25.0000 mg | ORAL_TABLET | Freq: Three times a day (TID) | ORAL | Status: DC | PRN
  Administered 2023-03-18: 25 mg via ORAL
  Filled 2023-03-18: qty 1

## 2023-03-18 MED ORDER — HYDRALAZINE HCL 25 MG PO TABS: 25.0000 mg | ORAL_TABLET | Freq: Four times a day (QID) | ORAL | Status: DC | PRN

## 2023-03-18 MED ORDER — LORAZEPAM 2 MG/ML IJ SOLN: 2.0000 mg | Freq: Three times a day (TID) | INTRAMUSCULAR | Status: DC | PRN

## 2023-03-18 MED ORDER — MAGNESIUM HYDROXIDE 400 MG/5ML PO SUSP: 30.0000 mL | Freq: Every day | ORAL | Status: DC | PRN

## 2023-03-18 MED ORDER — DIPHENHYDRAMINE HCL 50 MG/ML IJ SOLN: 50.0000 mg | Freq: Three times a day (TID) | INTRAMUSCULAR | Status: DC | PRN

## 2023-03-18 MED ORDER — NICOTINE 14 MG/24HR TD PT24
14.0000 mg | MEDICATED_PATCH | Freq: Every day | TRANSDERMAL | Status: DC
  Administered 2023-03-18 – 2023-03-21 (×4): 14 mg via TRANSDERMAL
  Filled 2023-03-18 (×4): qty 1

## 2023-03-18 MED ORDER — ESCITALOPRAM OXALATE 10 MG PO TABS
5.0000 mg | ORAL_TABLET | Freq: Every day | ORAL | Status: DC
  Administered 2023-03-19: 5 mg via ORAL
  Filled 2023-03-18: qty 1

## 2023-03-18 MED ORDER — ACETAMINOPHEN 325 MG PO TABS
650.0000 mg | ORAL_TABLET | Freq: Four times a day (QID) | ORAL | Status: DC | PRN
  Administered 2023-03-18 – 2023-03-20 (×4): 650 mg via ORAL
  Filled 2023-03-18 (×4): qty 2

## 2023-03-18 MED ORDER — HALOPERIDOL LACTATE 5 MG/ML IJ SOLN: 5.0000 mg | Freq: Three times a day (TID) | INTRAMUSCULAR | Status: DC | PRN

## 2023-03-18 MED ORDER — METFORMIN HCL 500 MG PO TABS
500.0000 mg | ORAL_TABLET | Freq: Two times a day (BID) | ORAL | Status: DC
  Administered 2023-03-18 – 2023-03-21 (×6): 500 mg via ORAL
  Filled 2023-03-18 (×7): qty 1

## 2023-03-18 MED ORDER — TRAZODONE HCL 50 MG PO TABS
50.0000 mg | ORAL_TABLET | Freq: Every evening | ORAL | Status: DC | PRN
  Administered 2023-03-19: 50 mg via ORAL
  Filled 2023-03-18: qty 1

## 2023-03-18 MED ORDER — LORAZEPAM 2 MG PO TABS: 2.0000 mg | ORAL_TABLET | Freq: Three times a day (TID) | ORAL | Status: DC | PRN

## 2023-03-18 MED ORDER — HALOPERIDOL 5 MG PO TABS: 5.0000 mg | ORAL_TABLET | Freq: Three times a day (TID) | ORAL | Status: DC | PRN

## 2023-03-18 MED ORDER — ALUM & MAG HYDROXIDE-SIMETH 200-200-20 MG/5ML PO SUSP: 30.0000 mL | ORAL | Status: DC | PRN

## 2023-03-18 NOTE — Progress Notes (Addendum)
Pt was accepted to [facility] TODAY 03/18/2023. Bed assignment: 15  Pt meets inpatient criteria per Alona Bene, PMHNP  Attending Physician will be Elane Fritz, DO  Report can be called to: [phone]  Pt can arrive after 1 PM  Care Team Notified: [facility], RN, Carleene Overlie, Paramedic, Jacquelynn Cree, NT, and [facility], PMHNP   [place], [place]  03/18/2023 11:31 AM

## 2023-03-18 NOTE — Tx Team (Signed)
Initial Treatment Plan 03/18/2023 4:42 PM Julie [name] ZOX:[id number]    PATIENT STRESSORS: Financial difficulties   Marital or family conflict   Medication change or noncompliance   Substance abuse     PATIENT STRENGTHS: Motivation for treatment/growth    PATIENT IDENTIFIED PROBLEMS: Substance Abuse  Depression  Medication non compliance                 DISCHARGE CRITERIA:  Improved stabilization in mood, thinking, and/or behavior Motivation to continue treatment in a less acute level of care Verbal commitment to aftercare and medication compliance  PRELIMINARY DISCHARGE PLAN: Outpatient therapy  PATIENT/FAMILY INVOLVEMENT: This treatment plan has been presented to and reviewed with the patient, Julie [name]. The patient has been given the opportunity to ask questions and make suggestions.  Roseanne Reno, RN 03/18/2023, 4:42 PM

## 2023-03-18 NOTE — Progress Notes (Addendum)
Per  Dr. Lucianne Muss and Alona Bene, PMHNP. Patient needs inpatient psychiatric admission for stabilization and treatment. Recommending patient to go to [facility] for detox treatment.    -Per psych provider Lavonna Monarch there is no availability within [facility].   Per [facility] Oluwatosin Olasunkanmi,RN. Pt is currently under review for inpatient behavioral health placement at [facility] PENDING recheck of VS and signed voluntary consent uploaded to chart.  -Jeanmarie Hubert, Paramedic informed VS will be checked once pt wake up.  Care Team notified: Alona Bene, PMHNP,Roy Delman Cheadle, Paramedic   Maryjean Ka, MSW, [facility] 03/18/2023 12:29 AM

## 2023-03-18 NOTE — Plan of Care (Signed)
  Problem: Education: Goal: Knowledge of [facility] Education information/materials will improve Outcome: Progressing Goal: Verbalization of understanding the information provided will improve Outcome: Progressing   
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
## 2023-03-18 NOTE — ED Provider Notes (Signed)
Emergency Medicine Observation Re-evaluation Note  Julie [name] is a 47 y.o. female, seen on rounds today.  Pt initially presented to the ED for complaints of Psychiatric Evaluation Currently, the patient is comfortable.  Nursing staff has no complaints.  However, psychiatry team did request optimization of blood pressure.  Patient has history of polysubstance use, particularly cocaine use disorder.  She has never been diagnosed with hypertension.  She has been seen in the ER multiple times in the last month alone, and her blood pressure has fluctuated between 130 and 200 systolic.  Currently patient has no chest pain, headaches. UDS positive for cocaine.  Physical Exam  BP (!) 168/81   Pulse (!) 104   Temp 98.4 F (36.9 C) (Oral)   Resp 16   Ht 5\' 11"  (1.803 m)   Wt 129.3 kg   SpO2 97%   BMI 39.76 kg/m  Physical Exam General: No distress Cardiac: Tachycardia Lungs: No respiratory distress Psych: Currently calm  ED Course / MDM  EKG:   I have reviewed the labs performed to date as well as medications administered while in observation.  Recent changes in the last 24 hours include -patient has been accepted for inpatient placement for stabilization.  She will need transfer to [facility].  Plan  Current plan is for patient to be admitted for psychiatry stabilization.  As needed hydralazine has been ordered. I discussed with the psychiatry team, that patient has cocaine use disorder, no diagnosis of hypertension and fluctuating BP.  In this setting, it would be difficult to give her a primary diagnosis of pretension.  It is best to put patient on as needed hydralazine 25 mg every 6 hours for systolic blood pressure over 180 mmhg.    Derwood Kaplan, MD 03/18/23 1139

## 2023-03-19 DIAGNOSIS — F332 Major depressive disorder, recurrent severe without psychotic features: Secondary | ICD-10-CM

## 2023-03-19 MED ORDER — CEPHALEXIN 500 MG PO CAPS
500.0000 mg | ORAL_CAPSULE | Freq: Three times a day (TID) | ORAL | Status: DC
  Administered 2023-03-19 – 2023-03-21 (×6): 500 mg via ORAL
  Filled 2023-03-19 (×7): qty 1

## 2023-03-19 MED ORDER — POTASSIUM CHLORIDE 20 MEQ PO PACK
20.0000 meq | PACK | Freq: Every day | ORAL | Status: DC
  Administered 2023-03-19 – 2023-03-20 (×2): 20 meq via ORAL
  Filled 2023-03-19 (×3): qty 1

## 2023-03-19 MED ORDER — GABAPENTIN 300 MG PO CAPS
300.0000 mg | ORAL_CAPSULE | Freq: Three times a day (TID) | ORAL | Status: DC
  Administered 2023-03-19 – 2023-03-21 (×6): 300 mg via ORAL
  Filled 2023-03-19 (×6): qty 1

## 2023-03-19 MED ORDER — HYDROCHLOROTHIAZIDE 25 MG PO TABS
25.0000 mg | ORAL_TABLET | Freq: Every day | ORAL | Status: DC
  Administered 2023-03-19 – 2023-03-21 (×3): 25 mg via ORAL
  Filled 2023-03-19 (×3): qty 1

## 2023-03-19 MED ORDER — ESCITALOPRAM OXALATE 10 MG PO TABS
10.0000 mg | ORAL_TABLET | Freq: Every day | ORAL | Status: DC
  Administered 2023-03-20 – 2023-03-21 (×2): 10 mg via ORAL
  Filled 2023-03-19 (×2): qty 1

## 2023-03-19 MED ORDER — NABUMETONE 500 MG PO TABS
500.0000 mg | ORAL_TABLET | Freq: Two times a day (BID) | ORAL | Status: DC
  Administered 2023-03-19 – 2023-03-21 (×4): 500 mg via ORAL
  Filled 2023-03-19 (×6): qty 1

## 2023-03-19 MED ORDER — METHOCARBAMOL 500 MG PO TABS
500.0000 mg | ORAL_TABLET | Freq: Three times a day (TID) | ORAL | Status: DC
  Administered 2023-03-19 – 2023-03-21 (×6): 500 mg via ORAL
  Filled 2023-03-19 (×6): qty 1

## 2023-03-19 MED ORDER — NALTREXONE HCL 50 MG PO TABS
50.0000 mg | ORAL_TABLET | Freq: Every day | ORAL | Status: DC
  Administered 2023-03-19 – 2023-03-21 (×3): 50 mg via ORAL
  Filled 2023-03-19 (×3): qty 1

## 2023-03-19 NOTE — Group Note (Signed)
Date:  03/19/2023 Time:  2:00 AM  Group Topic/Focus:  Wrap-Up Group:   The focus of this group is to help patients review their daily goal of treatment and discuss progress on daily workbooks.    Participation Level:  Active  Participation Quality:  Appropriate  Affect:  Appropriate  Cognitive:  Appropriate  Insight: Appropriate  Engagement in Group:  Engaged  Modes of Intervention:  Discussion  Additional Comments:    Lenore Cordia 03/19/2023, 2:00 AM

## 2023-03-19 NOTE — Plan of Care (Signed)
  Problem: Education: Goal: Knowledge of General Education information will improve Description: Including pain rating scale, medication(s)/side effects and non-pharmacologic comfort measures Outcome: Not Progressing   Problem: Health Behavior/Discharge Planning: Goal: Ability to manage health-related needs will improve Outcome: Not Progressing   Problem: Clinical Measurements: Goal: Ability to maintain clinical measurements within normal limits will improve Outcome: Not Progressing Goal: Will remain free from infection Outcome: Not Progressing Goal: Diagnostic test results will improve Outcome: Not Progressing Goal: Respiratory complications will improve Outcome: Not Progressing Goal: Cardiovascular complication will be avoided Outcome: Not Progressing   Problem: Activity: Goal: Risk for activity intolerance will decrease Outcome: Not Progressing   Problem: Nutrition: Goal: Adequate nutrition will be maintained Outcome: Not Progressing   Problem: Coping: Goal: Level of anxiety will decrease Outcome: Not Progressing   Problem: Elimination: Goal: Will not experience complications related to bowel motility Outcome: Not Progressing Goal: Will not experience complications related to urinary retention Outcome: Not Progressing   Problem: Pain Managment: Goal: General experience of comfort will improve Outcome: Not Progressing   Problem: Safety: Goal: Ability to remain free from injury will improve Outcome: Not Progressing   Problem: Skin Integrity: Goal: Risk for impaired skin integrity will decrease Outcome: Not Progressing   Problem: Education: Goal: Knowledge of [place] [facility] Education information/materials will improve Outcome: Not Progressing Goal: Emotional status will improve Outcome: Not Progressing Goal: Mental status will improve Outcome: Not Progressing Goal: Verbalization of understanding the information provided will improve Outcome: Not  Progressing   Problem: Activity: Goal: Interest or engagement in activities will improve Outcome: Not Progressing Goal: Sleeping patterns will improve Outcome: Not Progressing   Problem: Coping: Goal: Ability to verbalize frustrations and anger appropriately will improve Outcome: Not Progressing Goal: Ability to demonstrate self-control will improve Outcome: Not Progressing   Problem: Health Behavior/Discharge Planning: Goal: Identification of resources available to assist in meeting health care needs will improve Outcome: Not Progressing Goal: Compliance with treatment plan for underlying cause of condition will improve Outcome: Not Progressing   Problem: Physical Regulation: Goal: Ability to maintain clinical measurements within normal limits will improve Outcome: Not Progressing   Problem: Safety: Goal: Periods of time without injury will increase Outcome: Not Progressing   

## 2023-03-19 NOTE — Group Note (Signed)
Date:  03/19/2023 Time:  8:31 PM  Group Topic/Focus:  Goals Group:   The focus of this group is to help patients establish daily goals to achieve during treatment and discuss how the patient can incorporate goal setting into their daily lives to aide in recovery.    Participation Level:  Active  Participation Quality:  Appropriate  Affect:  Appropriate  Cognitive:  Appropriate  Insight: Appropriate  Engagement in Group:  Engaged  Modes of Intervention:  Education  Additional Comments:    Garry Heater 03/19/2023, 8:31 PM

## 2023-03-19 NOTE — Progress Notes (Signed)
D- Patient alert and oriented x 4. Affect flat/mood depressed. Denies SI/ HI/ AVH. Patient complains of back and leg pain 9/10. PRN Tylenol 650 mg administered with poor results. NP ordered Relafen, Robaxin and Gabapentin with good results 4/10. Patient endorses depression and anxiety. Her goal today is to "listen and just try to put things together". A- Scheduled and PRN medications administered to patient, per MD orders. Support and encouragement provided.  Routine safety checks conducted every 15 minutes without incident.  Patient informed to notify staff with problems or concerns and verbalizes understanding. R- No adverse drug reactions noted.  Patient compliant with medications and treatment plan. Patient receptive, calm cooperative and interacts well with others on the unit.  Patient contracts for safety and  remains safe on the unit at this time.

## 2023-03-19 NOTE — Group Note (Signed)
Date:  03/19/2023 Time:  10:51 AM  Group Topic/Focus:  Coping With Mental Health Crisis:   The purpose of this group is to help patients identify strategies for coping with mental health crisis.  Group discusses possible causes of crisis and ways to manage them effectively.    Participation Level:  Active  Participation Quality:  Appropriate  Affect:  Appropriate  Cognitive:  Appropriate  Insight: Appropriate  Engagement in Group:  Engaged  Modes of Intervention:  Activity  Additional Comments:    Mary Sella Kaliyah Gladman 03/19/2023, 10:51 AM
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
## 2023-03-19 NOTE — Progress Notes (Signed)
Patient alert and oriented x 4 affect is flat thoughts are organized and coherent, she was receptive to staff, interacting appropriately with peers and staff. Patient appears less anxious, speech is soft non pressured, she currently denies SI/HI/AVH. Patient was offered emotional support, 15 minutes safety checks maintained will continue to monitor.
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
## 2023-03-19 NOTE — [facility] Suicide Risk Assessment (Cosign Needed Addendum)
[facility] Admission Suicide Risk Assessment   Nursing information obtained from:  Patient Demographic factors:  Low socioeconomic status, Unemployed Current Mental Status:  Suicidal ideation indicated by patient Loss Factors:  Loss of significant relationship, Financial problems / change in socioeconomic status, Decline in physical health Historical Factors:  Family history of mental illness or substance abuse Risk Reduction Factors:  Religious beliefs about death  Total Time spent with patient: 1 hour Principal Problem: MDD (major depressive disorder), recurrent severe, without psychosis (HCC) Diagnosis:  Principal Problem:   MDD (major depressive disorder), recurrent severe, without psychosis (HCC) Active Problems:   Cocaine use disorder, severe, dependence (HCC)  Subjective Data: "I started having homicidal and suicidal ideations and realized I did not want to hurt anyone" and went to the ED to get help.  Continued Clinical Symptoms:  Alcohol Use Disorder Identification Test Final Score (AUDIT): 2 The "Alcohol Use Disorders Identification Test", Guidelines for Use in Primary Care, Second Edition.  World Science writer [facility]). Score between 0-7:  no or low risk or alcohol related problems. Score between 8-15:  moderate risk of alcohol related problems. Score between 16-19:  high risk of alcohol related problems. Score 20 or above:  warrants further diagnostic evaluation for alcohol dependence and treatment.   CLINICAL FACTORS:   Depression:   Severe  Musculoskeletal: Strength & Muscle Tone: within normal limits Gait & Station: normal Patient leans: N/A   Psychiatric Specialty Exam: Physical Exam Vitals and nursing note reviewed.  Constitutional:      Appearance: Normal appearance.  HENT:     Head: Normocephalic.     Nose: Nose normal.  Pulmonary:     Effort: Pulmonary effort is normal.  Musculoskeletal:        General: Normal range of motion.     Cervical back: Normal  range of motion.  Neurological:     General: No focal deficit present.     Mental Status: She is alert and oriented to person, place, and time.       Review of Systems  Psychiatric/Behavioral:  Positive for depression and substance abuse. The patient is nervous/anxious.   All other systems reviewed and are negative.    Blood pressure (!) 168/102, pulse 74, temperature 97.7 F (36.5 C), temperature source Oral, resp. rate 20, height 5\' 11"  (1.803 m), weight 110.7 kg, SpO2 99%.Body mass index is 34.03 kg/m.  General Appearance: Casual  Eye Contact:  Fair  Speech:  Normal Rate  Volume:  Normal  Mood:  Anxious and Depressed  Affect:  Congruent  Thought Process:  Coherent  Orientation:  Full (Time, Place, and Person)  Thought Content:  Rumination  Suicidal Thoughts:  No  Homicidal Thoughts:  No  Memory:  Immediate;   Fair Recent;   Fair Remote;   Fair  Judgement:  Fair  Insight:  Fair  Psychomotor Activity:  Decreased  Concentration:  Concentration: Fair and Attention Span: Fair  Recall:  Good  Fund of Knowledge:  Fair  Language:  Good  Akathisia:  No  Handed:  Right  AIMS (if indicated):     Assets:  Leisure Time Resilience Social Support  ADL's:  Intact  Cognition:  WNL  Sleep:          Physical Exam: Physical Exam Vitals and nursing note reviewed.  Constitutional:      Appearance: Normal appearance.  HENT:     Head: Normocephalic.     Nose: Nose normal.  Pulmonary:     Effort: Pulmonary effort  is normal.  Musculoskeletal:        General: Normal range of motion.     Cervical back: Normal range of motion.  Neurological:     General: No focal deficit present.     Mental Status: She is alert and oriented to person, place, and time.    Review of Systems  Musculoskeletal:  Positive for back pain.  Psychiatric/Behavioral:  Positive for depression, substance abuse and suicidal ideas. The patient is nervous/anxious.   All other systems reviewed and are  negative.  Blood pressure (!) 168/102, pulse 74, temperature 97.7 F (36.5 C), temperature source Oral, resp. rate 20, height 5\' 11"  (1.803 m), weight 110.7 kg, SpO2 99%. Body mass index is 34.03 kg/m.   COGNITIVE FEATURES THAT CONTRIBUTE TO RISK:  None    SUICIDE RISK:   Mild:  Suicidal ideation of limited frequency, intensity, duration, and specificity.  There are no identifiable plans, no associated intent, mild dysphoria and related symptoms, good self-control (both objective and subjective assessment), few other risk factors, and identifiable protective factors, including available and accessible social support.  PLAN OF CARE:  Major depressive disorder, recurrent, severe without psychosis: Lexapro 10 mg daily  Cocaine use d/o: Gabapentin 300 mg TID Naltrexone 50 mg daily  Back pain: Relafen 500 mg BID Robaxin 500 mg TID  Insomnia: Trazodone 50 mg daily at bedtime  Anxiety : Hydroxyzine 25 mg every 8 hours PRN  HTN: Hydrochlorothiazide 25 mg daily K+ 20 mEq daily, 3.3 on admission  UTI: Keflex 500 mg every 8 hours for 5 days  I certify that inpatient services furnished can reasonably be expected to improve the patient's condition.   Nanine Means, NP 03/19/2023, 7:06 AM

## 2023-03-19 NOTE — Group Note (Signed)
Date:  03/19/2023 Time:  6:12 PM  Group Topic/Focus:  Activity Group:  The focus of the group is to promote activity for the patients to get some exercise and some fresh air outside in the courtyard.   Participation Level:  Did Not Attend   Julie [name] 03/19/2023, 6:12 PM

## 2023-03-20 DIAGNOSIS — F332 Major depressive disorder, recurrent severe without psychotic features: Secondary | ICD-10-CM | POA: Diagnosis not present

## 2023-03-20 MED ORDER — ONDANSETRON HCL 4 MG PO TABS
4.0000 mg | ORAL_TABLET | Freq: Three times a day (TID) | ORAL | Status: DC | PRN
  Administered 2023-03-21: 4 mg via ORAL
  Filled 2023-03-20: qty 1

## 2023-03-20 MED ORDER — LISINOPRIL 5 MG PO TABS
10.0000 mg | ORAL_TABLET | Freq: Every day | ORAL | Status: DC
  Administered 2023-03-20 – 2023-03-21 (×2): 10 mg via ORAL
  Filled 2023-03-20 (×2): qty 2

## 2023-03-20 NOTE — Plan of Care (Signed)
  Problem: Health Behavior/Discharge Planning: Goal: Ability to manage health-related needs will improve Outcome: Progressing   Problem: Education: Goal: Knowledge of General Education information will improve Description: Including pain rating scale, medication(s)/side effects and non-pharmacologic comfort measures Outcome: Progressing   

## 2023-03-20 NOTE — Plan of Care (Signed)
  Problem: Coping: Goal: Level of anxiety will decrease Outcome: Progressing   Problem: Safety: Goal: Ability to remain free from injury will improve Outcome: Progressing   Problem: Health Behavior/Discharge Planning: Goal: Compliance with treatment plan for underlying cause of condition will improve Outcome: Progressing

## 2023-03-20 NOTE — Group Note (Signed)
[facility] LCSW Group Therapy Note   Group Date: 03/20/2023 Start Time: 1300 End Time: 1340   Type of Therapy/Topic:  Group Therapy:  Balance in Life  Participation Level:  Did Not Attend   Description of Group:    This group will address the concept of balance and how it feels and looks when one is unbalanced. Patients will be encouraged to process areas in their lives that are out of balance, and identify reasons for remaining unbalanced. Facilitators will guide patients utilizing problem- solving interventions to address and correct the stressor making their life unbalanced. Understanding and applying boundaries will be explored and addressed for obtaining  and maintaining a balanced life. Patients will be encouraged to explore ways to assertively make their unbalanced needs known to significant others in their lives, using other group members and facilitator for support and feedback.  Therapeutic Goals: Patient will identify two or more emotions or situations they have that consume much of in their lives. Patient will identify signs/triggers that life has become out of balance:  Patient will identify two ways to set boundaries in order to achieve balance in their lives:  Patient will demonstrate ability to communicate their needs through discussion and/or role plays  Summary of Patient Progress:   The patient did not attend group.        Marshell Levan, LCSW

## 2023-03-20 NOTE — Progress Notes (Signed)
Patient presents with appropriate mood and socializing appropriately. Patient denies SI,HI,and A/V/H with no plan or intent but continues to endorse depressed mood. Patient is med compliant and cooperative on unit.

## 2023-03-20 NOTE — Group Note (Signed)
Date:  03/20/2023 Time:  3:24 PM  Group Topic/Focus:  Activity Group:  The focus of the group to promote activity for the patients to go outside in the courtyard and get some exercise and some fresh air.    Participation Level:  Did Not Attend   Julie [name] Julie [name] 03/20/2023, 3:24 PM

## 2023-03-20 NOTE — Group Note (Signed)
Date:  03/20/2023 Time:  11:09 PM  Group Topic/Focus:  Wrap-Up Group:   The focus of this group is to help patients review their daily goal of treatment and discuss progress on daily workbooks.    Participation Level:  Did Not Attend  Lenore Cordia 03/20/2023, 11:09 PM

## 2023-03-20 NOTE — Progress Notes (Signed)
Patient is alert and oriented x 4, isolated to the room and asked to speak to the nurse. Upon arrival to the room she was noted lying in the bed affect is flat, she appeared fatigued and she expressed that she vomited twice during this shift. She also  stated " my belly feels likes its burning and l feel nauseated"  paper is warm to touch, temp 97.1, B/P 109/47, Pulse 72 regular, rate, and rhythm, respiration 18. she was noted isolated to the room and asked to speak to the nurse. Upon arrival to the room she was lying in the bed. Patient is in ABT/UTI, skin is warm to touch non diaphoretic, no acute distress noted will notify provider .

## 2023-03-20 NOTE — Progress Notes (Signed)
Patient alert and oriented x 4, affect is flat but brightens upon approach, she was visible in the milieu interacting in the dayroom with peers no distress noted, she denies pain on this shift and stated " the new medication they gave me eased my pain" patient denies SI/HI/AVH. Patient was offered emotional support and encouragement, 15 minutes safety checks maintained will continue to monitor.

## 2023-03-20 NOTE — Progress Notes (Signed)
[facility] MD Progress Note  03/20/2023 7:00 AM Julie [name]  MRN:  [id number]  Subjective:  Notes, labs, vital signs and treatment team updates completed. Upon entering room, client resting in bed. Client rates her depression moderate and anxiety "fair". Denies suicidal ideations. Client denies side effects from medications and states "they're fine". Sleep is good, "I can't do anything but sleep", using cocaine/crack prior to admission. Client states appetite "was good and eating too much" but now "I am not hungry at all".  She was in her room during the assessment, calmly reading.  Reported her back pain of 3/10, PRN meds, in place which she feels is helping."    Principal Problem: MDD (major depressive disorder), recurrent severe, without psychosis (HCC) Diagnosis: Principal Problem:   MDD (major depressive disorder), recurrent severe, without psychosis (HCC) Active Problems:   Cocaine use disorder, severe, dependence (HCC)  Total Time spent with patient: 30 minutes  Past Psychiatric History: Cocaine use disorder, depression, anxiety, PTSD  Past Medical History:  Past Medical History:  Diagnosis Date   Asthma    Fracture, humerus closed, shaft 07/11/2014   left   History of bronchitis    Mental health problem    Open fracture of great toe of left foot 12/25/2015    Past Surgical History:  Procedure Laterality Date   CESAREAN SECTION     ECTOPIC PREGNANCY SURGERY  2003   INCISION AND DRAINAGE Left 12/25/2015   Procedure: INCISION AND DRAINAGE With  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 No pertinent family history. Family Psychiatric  History: Sister, PTSD Social History:  Social History   Substance and Sexual Activity  Alcohol Use Yes   Comment: occasionally     Social History   Substance and Sexual Activity  Drug Use Yes   Types: Marijuana, "Crack" cocaine, MDMA (Ecstacy)   Comment: Crack, THC, Molly    Social History   Socioeconomic History   Marital status: Single    Spouse name: Not on file   Number of children: Not on file   Years of education: Not on file   Highest education level: Not on file  Occupational History   Not on file  Tobacco Use   Smoking status: Every Day    Current packs/day: 0.50    Average packs/day: 0.5 packs/day for 18.0 years (9.0 ttl pk-yrs)    Types: Cigarettes   Smokeless tobacco: Never   Tobacco comments:    6 cig./day  Vaping Use   Vaping status: Never Used  Substance and Sexual Activity   Alcohol use: Yes    Comment: occasionally   Drug use: Yes    Types: Marijuana, "Crack" cocaine, MDMA (Ecstacy)    Comment: Crack, THC, Molly   Sexual activity: Yes    Birth control/protection: I.U.D.  Other Topics Concern   Not on file  Social History Narrative   Not on file   Social Determinants of Health   Financial Resource Strain: Not on file  Food Insecurity: Food Insecurity Present (03/18/2023)   Hunger Vital Sign  Worried About Programme researcher, broadcasting/film/video in the Last Year: Often true    Barista in the Last Year: Often true  Transportation Needs: Unmet Transportation Needs (03/18/2023)   PRAPARE - Administrator, Civil Service (Medical): Yes    Lack of Transportation (Non-Medical): Yes  Physical Activity: Not on file  Stress: Not on file  Social Connections: Not on file   Additional Social History:                         Sleep: Good  Appetite:  Fair  Current Medications: Current Facility-Administered Medications  Medication Dose Route Frequency Provider Last Rate  Last Admin   acetaminophen (TYLENOL) tablet 650 mg  650 mg Oral Q6H PRN Motley-Mangrum, Jadeka A, PMHNP   650 mg at 03/19/23 1724   alum & mag hydroxide-simeth (MAALOX/MYLANTA) 200-200-20 MG/5ML suspension 30 mL  30 mL Oral Q4H PRN Motley-Mangrum, Jadeka A, PMHNP       amLODipine (NORVASC) tablet 5 mg  5 mg Oral Daily Motley-Mangrum, Jadeka A, PMHNP   5 mg at 03/19/23 0808   cephALEXin (KEFLEX) capsule 500 mg  500 mg Oral Q8H Charm Rings, NP   500 mg at 03/20/23 0654   diphenhydrAMINE (BENADRYL) capsule 50 mg  50 mg Oral TID PRN Motley-Mangrum, Jadeka A, PMHNP       Or   diphenhydrAMINE (BENADRYL) injection 50 mg  50 mg Intramuscular TID PRN Motley-Mangrum, Geralynn Ochs A, PMHNP       escitalopram (LEXAPRO) tablet 10 mg  10 mg Oral Daily Charm Rings, NP       gabapentin (NEURONTIN) capsule 300 mg  300 mg Oral TID Charm Rings, NP   300 mg at 03/19/23 1559   haloperidol (HALDOL) tablet 5 mg  5 mg Oral TID PRN Motley-Mangrum, Jadeka A, PMHNP       Or   haloperidol lactate (HALDOL) injection 5 mg  5 mg Intramuscular TID PRN Motley-Mangrum, Jadeka A, PMHNP       hydrALAZINE (APRESOLINE) tablet 25 mg  25 mg Oral Q6H PRN Motley-Mangrum, Jadeka A, PMHNP       hydrochlorothiazide (HYDRODIURIL) tablet 25 mg  25 mg Oral Daily Charm Rings, NP   25 mg at 03/19/23 1205   hydrOXYzine (ATARAX) tablet 25 mg  25 mg Oral TID PRN Motley-Mangrum, Jadeka A, PMHNP   25 mg at 03/18/23 1811   magnesium hydroxide (MILK OF MAGNESIA) suspension 30 mL  30 mL Oral Daily PRN Motley-Mangrum, Jadeka A, PMHNP       metFORMIN (GLUCOPHAGE) tablet 500 mg  500 mg Oral BID WC Motley-Mangrum, Jadeka A, PMHNP   500 mg at 03/19/23 1722   methocarbamol (ROBAXIN) tablet 500 mg  500 mg Oral TID Charm Rings, NP   500 mg at 03/19/23 1559   nabumetone (RELAFEN) tablet 500 mg  500 mg Oral BID Charm Rings, NP   500 mg at 03/19/23 1558   naltrexone (DEPADE) tablet 50 mg  50 mg Oral Daily Charm Rings, NP   50 mg at 03/19/23  1205   nicotine (NICODERM CQ - dosed in mg/24 hours) patch 14 mg  14 mg Transdermal Daily Sarina Ill, DO   14 mg at 03/19/23 4098   potassium chloride (KLOR-CON) packet 20 mEq  20 mEq Oral Daily Charm Rings, NP   20 mEq at 03/19/23 1431   traZODone (DESYREL) tablet 50 mg  50  mg Oral QHS PRN Motley-Mangrum, Jadeka A, PMHNP   50 mg at 03/19/23 2128    Lab Results: No results found for this or any previous visit (from the past 48 hour(s)).  Blood Alcohol level:  Lab Results  Component Value Date   ETH <10 03/16/2023   ETH <10 03/19/2022    Metabolic Disorder Labs: Lab Results  Component Value Date   HGBA1C 11.8 (H) 03/19/2022   MPG 291.96 03/19/2022   No results found for: "PROLACTIN" No results found for: "CHOL", "TRIG", "HDL", "CHOLHDL", "VLDL", "LDLCALC"  Physical Findings: AIMS:  , ,  ,  ,    CIWA:    COWS:     Musculoskeletal: Strength & Muscle Tone: within normal limits Gait & Station: normal Patient leans: N/A Psychiatric Specialty Exam: Physical Exam Vitals and nursing note reviewed.  Constitutional:      Appearance: Normal appearance.  HENT:     Head: Normocephalic.     Nose: Nose normal.  Pulmonary:     Effort: Pulmonary effort is normal.  Musculoskeletal:        General: Normal range of motion.     Cervical back: Normal range of motion.  Neurological:     General: No focal deficit present.     Mental Status: She is alert and oriented to person, place, and time.     Review of Systems  Psychiatric/Behavioral:  Positive for depression and substance abuse. The patient is nervous/anxious.   All other systems reviewed and are negative.   Blood pressure (!) 144/104, pulse 95, temperature (!) 97.5 F (36.4 C), resp. rate 16, height 5\' 11"  (1.803 m), weight 110.7 kg, SpO2 100%.Body mass index is 34.03 kg/m.  General Appearance: Casual  Eye Contact:  Good  Speech:  Normal Rate  Volume:  Normal  Mood:  Depressed  Affect:  Flat  Thought  Process:  Coherent  Orientation:  Full (Time, Place, and Person)  Thought Content:  WDL  Suicidal Thoughts:  No  Homicidal Thoughts:  No  Memory:  Immediate;   Good Recent;   Good Remote;   Good  Judgement:  Good  Insight:  Good  Psychomotor Activity:  Normal  Concentration:  Concentration: Good and Attention Span: Good  Recall:  Good  Fund of Knowledge:  Good  Language:  Good  Akathisia:  Negative  Handed:  Right  AIMS (if indicated):     Assets:  Communication Skills Resilience  ADL's:  Intact  Cognition:  WNL  Sleep:  Good    Physical Exam: Physical Exam Vitals and nursing note reviewed.  Constitutional:      Appearance: Normal appearance.  HENT:     Head: Normocephalic.     Nose: Nose normal.  Pulmonary:     Effort: Pulmonary effort is normal.  Musculoskeletal:        General: Normal range of motion.     Cervical back: Normal range of motion.  Neurological:     General: No focal deficit present.     Mental Status: She is alert and oriented to person, place, and time.    Review of Systems  Psychiatric/Behavioral:  Positive for depression and substance abuse. The patient is nervous/anxious.   All other systems reviewed and are negative.  Blood pressure (!) 144/104, pulse 95, temperature (!) 97.5 F (36.4 C), resp. rate 16, height 5\' 11"  (1.803 m), weight 110.7 kg, SpO2 100%. Body mass index is 34.03 kg/m.   Treatment Plan Summary: Daily contact with patient to assess  and evaluate symptoms and progress in treatment, Medication management, and Plan : Major depressive disorder, recurrent, severe without psychosis: Lexapro 10 mg daily   Cocaine use d/o: Gabapentin 300 mg TID Naltrexone 50 mg daily   Back pain: Relafen 500 mg BID Robaxin 500 mg TID   Insomnia: Trazodone 50 mg daily at bedtime   Anxiety : Hydroxyzine 25 mg every 8 hours PRN   HTN: Hydrochlorothiazide 25 mg daily K+ 20 mEq daily, 3.3 on admission   UTI: Keflex 500 mg every 8  hours for 5 days  Nanine Means, NP 03/20/2023, 7:00 AM

## 2023-03-21 DIAGNOSIS — F332 Major depressive disorder, recurrent severe without psychotic features: Secondary | ICD-10-CM | POA: Diagnosis not present

## 2023-03-21 MED ORDER — AMLODIPINE BESYLATE 5 MG PO TABS: 5.0000 mg | ORAL_TABLET | Freq: Every day | ORAL | 0 refills | Status: DC

## 2023-03-21 MED ORDER — TRAZODONE HCL 50 MG PO TABS: 50.0000 mg | ORAL_TABLET | Freq: Every evening | ORAL | 0 refills | Status: DC | PRN

## 2023-03-21 MED ORDER — HYDROXYZINE HCL 25 MG PO TABS: 25.0000 mg | ORAL_TABLET | Freq: Three times a day (TID) | ORAL | 0 refills | Status: AC | PRN

## 2023-03-21 MED ORDER — ESCITALOPRAM OXALATE 10 MG PO TABS: 10.0000 mg | ORAL_TABLET | Freq: Every day | ORAL | 0 refills | Status: DC

## 2023-03-21 MED ORDER — LISINOPRIL 10 MG PO TABS: 10.0000 mg | ORAL_TABLET | Freq: Every day | ORAL | 0 refills | Status: DC

## 2023-03-21 MED ORDER — NABUMETONE 500 MG PO TABS: 500.0000 mg | ORAL_TABLET | Freq: Two times a day (BID) | ORAL | 0 refills | Status: AC

## 2023-03-21 MED ORDER — CEPHALEXIN 500 MG PO CAPS: 500.0000 mg | ORAL_CAPSULE | Freq: Three times a day (TID) | ORAL | 0 refills | Status: AC

## 2023-03-21 MED ORDER — METHOCARBAMOL 500 MG PO TABS: 500.0000 mg | ORAL_TABLET | Freq: Three times a day (TID) | ORAL | 0 refills | Status: AC

## 2023-03-21 MED ORDER — NALTREXONE HCL 50 MG PO TABS: 50.0000 mg | ORAL_TABLET | Freq: Every day | ORAL | 0 refills | Status: AC

## 2023-03-21 MED ORDER — HYDROCHLOROTHIAZIDE 25 MG PO TABS: 25.0000 mg | ORAL_TABLET | Freq: Every day | ORAL | 0 refills | Status: DC
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
## 2023-03-21 NOTE — [facility] Suicide Risk Assessment (Signed)
[facility] INPATIENT:  Family/Significant Other Suicide Prevention Education  Suicide Prevention Education:  Education Completed; Gershon Cull Younger sister, [phone], has been identified by the patient as the family member/significant other with whom the patient will be residing, and identified as the person(s) who will aid the patient in the event of a mental health crisis (suicidal ideations/suicide attempt).  With written consent from the patient, the family member/significant other has been provided the following suicide prevention education, prior to the and/or following the discharge of the patient.  The suicide prevention education provided includes the following: Suicide risk factors Suicide prevention and interventions National Suicide Hotline telephone number [facility] assessment telephone number [facility] Emergency Assistance 911 [facility] and/or Residential Mobile Crisis Unit telephone number  Request made of family/significant other to: Remove weapons (e.g., guns, rifles, knives), all items previously/currently identified as safety concern.   Remove drugs/medications (over-the-counter, prescriptions, illicit drugs), all items previously/currently identified as a safety concern.  The family member/significant other verbalizes understanding of the suicide prevention education information provided.  The family member/significant other agrees to remove the items of safety concern listed above.  Sister reports that she was unaware that patient was hospitalized.  She reports "I know that she had been talking about wanting to get clean".  She reports that patient does not have access to weapons.  She reports that patient is not danger to self others.    Harden Mo 03/21/2023, 2:49 PM

## 2023-03-21 NOTE — Progress Notes (Signed)
Patient pleasant and cooperative on approach. Denies SI,HI and AVH. Verbalized understanding discharge instructions,prescriptions and follow up care.  All belongings returned from BM[facility] locker. Suicide safety plan filled by patient and placed in chart. Copy given to patient.Patient escorted out by staff and transported by cab. 

## 2023-03-21 NOTE — BH IP Treatment Plan (Signed)
Interdisciplinary Treatment and Diagnostic Plan Update  03/21/2023 Time of Session: 10:28 AM  Julie [name] MRN: [id number]  Principal Diagnosis: MDD (major depressive disorder), recurrent severe, without psychosis (HCC)  Secondary Diagnoses: Principal Problem:   MDD (major depressive disorder), recurrent severe, without psychosis (HCC) Active Problems:   Cocaine use disorder, severe, dependence (HCC)   Current Medications:  Current Facility-Administered Medications  Medication Dose Route Frequency Provider Last Rate Last Admin   acetaminophen (TYLENOL) tablet 650 mg  650 mg Oral Q6H PRN Motley-Mangrum, Jadeka A, PMHNP   650 mg at 03/20/23 0818   alum & mag hydroxide-simeth (MAALOX/MYLANTA) 200-200-20 MG/5ML suspension 30 mL  30 mL Oral Q4H PRN Motley-Mangrum, Jadeka A, PMHNP       amLODipine (NORVASC) tablet 5 mg  5 mg Oral Daily Motley-Mangrum, Jadeka A, PMHNP   5 mg at 03/21/23 0814   cephALEXin (KEFLEX) capsule 500 mg  500 mg Oral Q8H Charm Rings, NP   500 mg at 03/21/23 4098   diphenhydrAMINE (BENADRYL) capsule 50 mg  50 mg Oral TID PRN Motley-Mangrum, Jadeka A, PMHNP       Or   diphenhydrAMINE (BENADRYL) injection 50 mg  50 mg Intramuscular TID PRN Motley-Mangrum, Geralynn Ochs A, PMHNP       escitalopram (LEXAPRO) tablet 10 mg  10 mg Oral Daily Charm Rings, NP   10 mg at 03/21/23 1191   gabapentin (NEURONTIN) capsule 300 mg  300 mg Oral TID Charm Rings, NP   300 mg at 03/21/23 4782   haloperidol (HALDOL) tablet 5 mg  5 mg Oral TID PRN Motley-Mangrum, Geralynn Ochs A, PMHNP       Or   haloperidol lactate (HALDOL) injection 5 mg  5 mg Intramuscular TID PRN Motley-Mangrum, Jadeka A, PMHNP       hydrALAZINE (APRESOLINE) tablet 25 mg  25 mg Oral Q6H PRN Motley-Mangrum, Jadeka A, PMHNP       hydrochlorothiazide (HYDRODIURIL) tablet 25 mg  25 mg Oral Daily Charm Rings, NP   25 mg at 03/21/23 0814   hydrOXYzine (ATARAX) tablet 25 mg  25 mg Oral TID PRN Motley-Mangrum, Jadeka A,  PMHNP   25 mg at 03/18/23 1811   lisinopril (ZESTRIL) tablet 10 mg  10 mg Oral Daily Charm Rings, NP   10 mg at 03/21/23 0814   magnesium hydroxide (MILK OF MAGNESIA) suspension 30 mL  30 mL Oral Daily PRN Motley-Mangrum, Jadeka A, PMHNP       metFORMIN (GLUCOPHAGE) tablet 500 mg  500 mg Oral BID WC Motley-Mangrum, Jadeka A, PMHNP   500 mg at 03/21/23 0814   methocarbamol (ROBAXIN) tablet 500 mg  500 mg Oral TID Charm Rings, NP   500 mg at 03/21/23 9562   nabumetone (RELAFEN) tablet 500 mg  500 mg Oral BID Charm Rings, NP   500 mg at 03/21/23 0815   naltrexone (DEPADE) tablet 50 mg  50 mg Oral Daily Charm Rings, NP   50 mg at 03/21/23 1308   nicotine (NICODERM CQ - dosed in mg/24 hours) patch 14 mg  14 mg Transdermal Daily Sarina Ill, DO   14 mg at 03/21/23 0815   ondansetron (ZOFRAN) tablet 4 mg  4 mg Oral Q8H PRN Sindy Guadeloupe, NP   4 mg at 03/21/23 0636   potassium chloride (KLOR-CON) packet 20 mEq  20 mEq Oral Daily Charm Rings, NP   20 mEq at 03/20/23 0819   traZODone (DESYREL) tablet 50 mg  50 mg Oral QHS PRN Motley-Mangrum, Jadeka A, PMHNP   50 mg at 03/19/23 2128   PTA Medications: Medications Prior to Admission  Medication Sig Dispense Refill Last Dose   benzonatate (TESSALON) 100 MG capsule Take 1 capsule (100 mg total) by mouth 3 (three) times daily as needed for cough. 21 capsule 0    metFORMIN (GLUCOPHAGE) 500 MG tablet Take 1 tablet (500 mg total) by mouth 2 (two) times daily with a meal. 60 tablet 0    miconazole (MICOTIN) 2 % cream Apply 1 Application topically 2 (two) times daily. Apply in your vagina and around the outside of your vagina twice a day 28.35 g 0     Patient Stressors: Financial difficulties   Marital or family conflict   Medication change or noncompliance   Substance abuse    Patient Strengths: Motivation for treatment/growth   Treatment Modalities: Medication Management, Group therapy, Case management,  1 to 1 session with  clinician, Psychoeducation, Recreational therapy.   Physician Treatment Plan for Primary Diagnosis: MDD (major depressive disorder), recurrent severe, without psychosis (HCC) Long Term Goal(s): Improvement in symptoms so as ready for discharge   Short Term Goals: Ability to identify changes in lifestyle to reduce recurrence of condition will improve Ability to verbalize feelings will improve Ability to disclose and discuss suicidal ideas Ability to demonstrate self-control will improve Ability to identify and develop effective coping behaviors will improve Ability to maintain clinical measurements within normal limits will improve Compliance with prescribed medications will improve Ability to identify triggers associated with substance abuse/mental health issues will improve  Medication Management: Evaluate patient's response, side effects, and tolerance of medication regimen.  Therapeutic Interventions: 1 to 1 sessions, Unit Group sessions and Medication administration.  Evaluation of Outcomes: Progressing  Physician Treatment Plan for Secondary Diagnosis: Principal Problem:   MDD (major depressive disorder), recurrent severe, without psychosis (HCC) Active Problems:   Cocaine use disorder, severe, dependence (HCC)  Long Term Goal(s): Improvement in symptoms so as ready for discharge   Short Term Goals: Ability to identify changes in lifestyle to reduce recurrence of condition will improve Ability to verbalize feelings will improve Ability to disclose and discuss suicidal ideas Ability to demonstrate self-control will improve Ability to identify and develop effective coping behaviors will improve Ability to maintain clinical measurements within normal limits will improve Compliance with prescribed medications will improve Ability to identify triggers associated with substance abuse/mental health issues will improve     Medication Management: Evaluate patient's response, side  effects, and tolerance of medication regimen.  Therapeutic Interventions: 1 to 1 sessions, Unit Group sessions and Medication administration.  Evaluation of Outcomes: Adequate for Discharge   RN Treatment Plan for Primary Diagnosis: MDD (major depressive disorder), recurrent severe, without psychosis (HCC) Long Term Goal(s): Knowledge of disease and therapeutic regimen to maintain health will improve  Short Term Goals: Ability to remain free from injury will improve, Ability to verbalize frustration and anger appropriately will improve, Ability to demonstrate self-control, Ability to participate in decision making will improve, Ability to verbalize feelings will improve, Ability to disclose and discuss suicidal ideas, Ability to identify and develop effective coping behaviors will improve, and Compliance with prescribed medications will improve  Medication Management: RN will administer medications as ordered by provider, will assess and evaluate patient's response and provide education to patient for prescribed medication. RN will report any adverse and/or side effects to prescribing provider.  Therapeutic Interventions: 1 on 1 counseling sessions, Psychoeducation, Medication administration, Evaluate responses  to treatment, Monitor vital signs and CBGs as ordered, Perform/monitor CIWA, COWS, AIMS and Fall Risk screenings as ordered, Perform wound care treatments as ordered.  Evaluation of Outcomes: Adequate for Discharge   LCSW Treatment Plan for Primary Diagnosis: MDD (major depressive disorder), recurrent severe, without psychosis (HCC) Long Term Goal(s): Safe transition to appropriate next level of care at discharge, Engage patient in therapeutic group addressing interpersonal concerns.  Short Term Goals: Engage patient in aftercare planning with referrals and resources, Increase social support, Increase ability to appropriately verbalize feelings, Increase emotional regulation, Facilitate  acceptance of mental health diagnosis and concerns, Facilitate patient progression through stages of change regarding substance use diagnoses and concerns, Identify triggers associated with mental health/substance abuse issues, and Increase skills for wellness and recovery  Therapeutic Interventions: Assess for all discharge needs, 1 to 1 time with Social worker, Explore available resources and support systems, Assess for adequacy in community support network, Educate family and significant other(s) on suicide prevention, Complete Psychosocial Assessment, Interpersonal group therapy.  Evaluation of Outcomes: Adequate for Discharge   Progress in Treatment: Attending groups: Yes. and No. Participating in groups: Yes. and No. Taking medication as prescribed: Yes. Toleration medication: Yes. Family/Significant other contact made: No, will contact:  CSW will contact if given permission   Patient understands diagnosis: Yes. Discussing patient identified problems/goals with staff: Yes. Medical problems stabilized or resolved: Yes. Denies suicidal/homicidal ideation: Yes. Issues/concerns per patient self-inventory: No. Other: None  New problem(s) identified: No, Describe:  None identified   New Short Term/Long Term Goal(s): detox, elimination of symptoms of psychosis, medication management for mood stabilization; elimination of SI thoughts; development of comprehensive mental wellness/sobriety plan.   Patient Goals:  "There's nothing to talk about I want to be released. This is not going to help me. This is not what I want"  Discharge Plan or Barriers: CSW will assist with appropriate discharge planning   Reason for Continuation of Hospitalization: Other; describe None   Estimated Length of Stay: 1 to 7 days   Last 3 [place] Suicide Severity Risk Score: Flowsheet Row Admission (Current) from 03/18/2023 in [facility] INPATIENT BEHAV[facility]AL MEDICINE ED from 03/16/2023 in [facility] Emergency  Department at [facility] ED from 02/27/2023 in [facility] Emergency Department at [facility]  C-SSRS RISK CATEGORY Low Risk No Risk No Risk       Last [facility] 2/9 Scores:    03/05/2019    3:28 PM  Depression screen PHQ 2/9  Decreased Interest 1  Down, Depressed, Hopeless 2  PHQ - 2 Score 3  Altered sleeping 3  Tired, decreased energy 3  Change in appetite 3  Feeling bad or failure about yourself  3  Trouble concentrating 1  Moving slowly or fidgety/restless 2  Suicidal thoughts 1  PHQ-9 Score 19    Scribe for Treatment Team: Elza Rafter, LCSWA 03/21/2023 11:12 AM

## 2023-03-21 NOTE — Group Note (Signed)
Recreation Therapy Group Note   Group Topic:General Recreation  Group Date: 03/21/2023 Start Time: 1040 End Time: 1130 Facilitators: Rosina Lowenstein, LRT, CTRS Location: Courtyard  Group Description: Outdoor Recreation. Patients had the option to play basketball, corn hole, or draw with chalk and listen to music while outside in the courtyard getting fresh air and sunlight. LRT and pts discussed things that they enjoy doing in their free time outside of the hospital.   Goal Area(s) Addressed: Patient will identify leisure interests.  Patient will practice healthy decision making. Patient will engage in recreation activity.   Affect/Mood: Appropriate   Participation Level: Active   Participation Quality: Independent   Behavior: Calm and Cooperative   Speech/Thought Process: Coherent   Insight: Fair   Judgement: Fair    Modes of Intervention: Activity   Patient Response to Interventions:  Receptive   Education Outcome:  Acknowledges education   Clinical Observations/Individualized Feedback: Julie [name] was active in their participation of session activities and group discussion. Pt sat and talked with peers while listening to music. Pt was calm and cooperative. Pt interacted well with LRT and peers duration of session.   Plan: Continue to engage patient in RT group sessions 2-3x/week.   Rosina Lowenstein, LRT, CTRS 03/21/2023 11:57 AM
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
## 2023-04-25 ENCOUNTER — Other Ambulatory Visit (HOSPITAL_COMMUNITY): Payer: Self-pay

## 2023-06-19 ENCOUNTER — Emergency Department (HOSPITAL_COMMUNITY)
Admission: EM | Admit: 2023-06-19 | Discharge: 2023-06-19 | Disposition: A | Discharge status: Home / Self Care | Payer: MEDICAID | Attending: Emergency Medicine | Admitting: Emergency Medicine

## 2023-06-19 ENCOUNTER — Other Ambulatory Visit: Payer: Self-pay

## 2023-06-19 DIAGNOSIS — J039 Acute tonsillitis, unspecified: Secondary | ICD-10-CM | POA: Diagnosis not present

## 2023-06-19 DIAGNOSIS — Z7984 Long term (current) use of oral hypoglycemic drugs: Secondary | ICD-10-CM | POA: Insufficient documentation

## 2023-06-19 DIAGNOSIS — J029 Acute pharyngitis, unspecified: Secondary | ICD-10-CM | POA: Diagnosis present

## 2023-06-19 DIAGNOSIS — Z8659 Personal history of other mental and behavioral disorders: Secondary | ICD-10-CM | POA: Insufficient documentation

## 2023-06-19 DIAGNOSIS — E119 Type 2 diabetes mellitus without complications: Secondary | ICD-10-CM | POA: Insufficient documentation

## 2023-06-19 DIAGNOSIS — Z79899 Other long term (current) drug therapy: Secondary | ICD-10-CM | POA: Insufficient documentation

## 2023-06-19 DIAGNOSIS — I10 Essential (primary) hypertension: Secondary | ICD-10-CM | POA: Diagnosis not present

## 2023-06-19 LAB — CBG MONITORING, ED: Glucose-Capillary: 234 mg/dL — ABNORMAL HIGH (ref 70–99)

## 2023-06-19 MED ORDER — AMOXICILLIN-POT CLAVULANATE 875-125 MG PO TABS: 1.0000 | ORAL_TABLET | Freq: Two times a day (BID) | ORAL | 0 refills | Status: DC

## 2023-06-19 MED ORDER — DEXAMETHASONE SODIUM PHOSPHATE 10 MG/ML IJ SOLN
10.0000 mg | Freq: Once | INTRAMUSCULAR | Status: AC
  Administered 2023-06-19: 10 mg via INTRAMUSCULAR
  Filled 2023-06-19: qty 1

## 2023-06-19 MED ORDER — AMOXICILLIN-POT CLAVULANATE 875-125 MG PO TABS
1.0000 | ORAL_TABLET | Freq: Once | ORAL | Status: AC
  Administered 2023-06-19: 1 via ORAL
  Filled 2023-06-19: qty 1

## 2023-06-19 MED ORDER — KETOROLAC TROMETHAMINE 15 MG/ML IJ SOLN
30.0000 mg | Freq: Once | INTRAMUSCULAR | Status: AC
  Administered 2023-06-19: 30 mg via INTRAMUSCULAR
  Filled 2023-06-19: qty 2

## 2023-06-19 MED ORDER — NAPROXEN 500 MG PO TABS: 500.0000 mg | ORAL_TABLET | Freq: Two times a day (BID) | ORAL | 0 refills | Status: DC

## 2023-06-19 NOTE — ED Provider Notes (Cosign Needed)
[place] EMERGENCY DEPARTMENT AT [facility] Provider Note   CSN: [id number] Arrival date & time: 06/19/23  1308     History  Chief Complaint  Patient presents with   Abscess   Sore Throat   Otalgia    Julie [name] is a 47 y.o. female.  Patient with history of cocaine use, hypertension, diabetes --presents to the emergency department today for evaluation of sore throat.  Patient states that her symptoms have been worsening over the past 1 to 2 days.  She has had painful swallowing but is able to swallow.  No difficulties breathing.  No fevers.  Pain radiates to the right ear.  She states that she has had an abscess in her throat in the past and her symptoms today feel similar.  No vomiting.  No known sick contacts.       Home Medications Prior to Admission medications   Medication Sig Start Date End Date Taking? Authorizing Provider  amLODipine (NORVASC) 5 MG tablet Take 1 tablet (5 mg total) by mouth daily. 03/22/23 04/21/23  Charm Rings, NP  escitalopram (LEXAPRO) 10 MG tablet Take 1 tablet (10 mg total) by mouth daily. 03/22/23 04/21/23  Charm Rings, NP  hydrochlorothiazide (HYDRODIURIL) 25 MG tablet Take 1 tablet (25 mg total) by mouth daily. 03/22/23 04/21/23  Charm Rings, NP  lisinopril (ZESTRIL) 10 MG tablet Take 1 tablet (10 mg total) by mouth daily. 03/22/23 04/21/23  Charm Rings, NP  metFORMIN (GLUCOPHAGE) 500 MG tablet Take 1 tablet (500 mg total) by mouth 2 (two) times daily with a meal. 12/01/22   Smoot, Sarah A, PA-C  miconazole (MICOTIN) 2 % cream Apply 1 Application topically 2 (two) times daily. Apply in your vagina and around the outside of your vagina twice a day 12/22/22   Bethann Berkshire, MD  traZODone (DESYREL) 50 MG tablet Take 1 tablet (50 mg total) by mouth at bedtime as needed for sleep. 03/21/23 04/20/23  Charm Rings, NP      Allergies    Patient has no known allergies.    Review of Systems   Review of Systems  Physical  Exam Updated Vital Signs BP (!) 175/133 (BP Location: Right Arm)   Pulse 99   Temp 99.6 F (37.6 C)   Resp (!) 24   LMP 02/22/2023   SpO2 97%  Physical Exam Vitals and nursing note reviewed.  Constitutional:      General: She is not in acute distress.    Appearance: She is well-developed.  HENT:     Head: Normocephalic and atraumatic.     Right Ear: External ear normal. Tympanic membrane is not erythematous or bulging.     Left Ear: External ear normal.     Nose: Nose normal.     Mouth/Throat:     Tongue: Tongue does not deviate from midline.     Pharynx: Posterior oropharyngeal erythema present.     Tonsils: No tonsillar abscesses. 3+ on the right. 3+ on the left.  Eyes:     Conjunctiva/sclera: Conjunctivae normal.  Cardiovascular:     Rate and Rhythm: Normal rate and regular rhythm.     Heart sounds: No murmur heard. Pulmonary:     Effort: No respiratory distress.     Breath sounds: No wheezing, rhonchi or rales.  Abdominal:     Palpations: Abdomen is soft.     Tenderness: There is no abdominal tenderness. There is no guarding or rebound.  Musculoskeletal:  Cervical back: Normal range of motion and neck supple.     Right lower leg: No edema.     Left lower leg: No edema.  Skin:    General: Skin is warm and dry.     Findings: No rash.  Neurological:     General: No focal deficit present.     Mental Status: She is alert. Mental status is at baseline.     Motor: No weakness.  Psychiatric:        Mood and Affect: Mood normal.     ED Results / Procedures / Treatments   Labs (all labs ordered are listed, but only abnormal results are displayed) Labs Reviewed  CBG MONITORING, ED - Abnormal; Notable for the following components:      Result Value   Glucose-Capillary 234 (*)    All other components within normal limits  SARS CORONAVIRUS 2 BY RT PCR    EKG None  Radiology No results found.  Procedures Procedures    Medications Ordered in  ED Medications  dexamethasone (DECADRON) injection 10 mg (has no administration in time range)  amoxicillin-clavulanate (AUGMENTIN) 875-125 MG per tablet 1 tablet (1 tablet Oral Given 06/19/23 1147)  ketorolac (TORADOL) 15 MG/ML injection 30 mg (30 mg Intramuscular Given 06/19/23 1147)    ED Course/ Medical Decision Making/ A&P    Patient seen and examined. History obtained directly from patient.   Labs/EKG: None ordered.   Imaging: None ordered.   Medications/Fluids: Ordered: PO augmentin, IM toradol.   Most recent vital signs reviewed and are as follows: BP (!) 175/133 (BP Location: Right Arm)   Pulse 99   Temp 99.6 F (37.6 C)   Resp (!) 24   LMP 02/22/2023   SpO2 97%   Initial impression: Tonsillitis without abscess or other concerning features including voice change, trismus or posturing.  Will check blood sugar and potentially give IM Decadron as well.  Patient appears comfortable and in no distress.  When I approached the stretcher, she was lying on her belly with her neck in an extended position without distress.  12:42 PM Reassessment performed. Patient appears stable, comfortable, resting comfortably.  Labs personally reviewed and interpreted including: Blood sugar 234  Reviewed pertinent lab work and imaging with patient at bedside.  We discussed the risks (hyperglycemia/DKA) and benefits (improvement in throat pain and swelling) with administration of IM Decadron given borderline blood sugar.  Patient would like to proceed with treatment.  Questions answered.   Most current vital signs reviewed and are as follows: BP (!) 175/133 (BP Location: Right Arm)   Pulse 99   Temp 99.6 F (37.6 C)   Resp (!) 24   LMP 02/22/2023   SpO2 97%   Plan: Discharge to home.   Prescriptions written for: Augmentin, naproxen  Other home care instructions discussed: Soft diet, maintain good hydration  ED return instructions discussed: New or worsening symptoms, persistent  vomiting, difficulty breathing or swallowing.  Follow-up instructions discussed: Patient encouraged to follow-up with their PCP in 3 days if not improving.                                   Medical Decision Making Risk Prescription drug management.   In regards to the patient's sore throat today, the following dangerous and potentially life threatening etiologies were considered on the differential diagnosis: Lugwig's angina, uvulitis, epiglottis, peritonsillar abscess, retropharyngeal abscess, Lemierre's syndrome. Also considered  were more common causes such as: streptococcal pharyngitis, gonococcal pharyngitis, non-bacterial pharyngitis (cold viruses, HSV/coxsackievirus, influenza, COVID-19, infectious mononucleosis, oropharyngeal candidiasis), and other non-infectious causes including seasonal allergies/post-nasal drip, GERD/esophagitis, trauma.   Exam consistent with tonsillitis, no obvious signs of abscess on exam today.  No tripoding.  No voice changes or trismus.  Do not feel that advanced imaging is indicated at this time.  The patient's vital signs, pertinent lab work and imaging were reviewed and interpreted as discussed in the ED course. Hospitalization was considered for further testing, treatments, or serial exams/observation. However as patient is well-appearing, has a stable exam, and reassuring studies today, I do not feel that they warrant admission at this time. This plan was discussed with the patient who verbalizes agreement and comfort with this plan and seems reliable and able to return to the Emergency Department with worsening or changing symptoms.          Final Clinical Impression(s) / ED Diagnoses Final diagnoses:  Tonsillitis    Rx / DC Orders ED Discharge Orders          Ordered    amoxicillin-clavulanate (AUGMENTIN) 875-125 MG tablet  Every 12 hours        06/19/23 1238    naproxen (NAPROSYN) 500 MG tablet  2 times daily        06/19/23 1238               Renne Crigler, [place] 06/19/23 1244

## 2023-06-19 NOTE — Discharge Instructions (Signed)
Please read and follow all provided instructions.  Your diagnoses today include:  1. Tonsillitis    Tests performed today include: Blood sugar was 230's Vital signs. See below for your results today.   Medications prescribed:  Augmentin - antibiotic  You have been prescribed an antibiotic medicine: take the entire course of medicine even if you are feeling better. Stopping early can cause the antibiotic not to work.  Naproxen - anti-inflammatory pain medication Do not exceed 500mg  naproxen every 12 hours, take with food  You have been prescribed an anti-inflammatory medication or NSAID. Take with food. Take smallest effective dose for the shortest duration needed for your pain. Stop taking if you experience stomach pain or vomiting.   Take any medications prescribed only as directed.   Home care instructions:  Please read the educational materials provided and follow any instructions contained in this packet.  Follow-up instructions: Please follow-up with your primary care provider as needed for further evaluation of your symptoms.  Return instructions:  Please return to the Emergency Department if you experience worsening symptoms.  Return if you have worsening problems swallowing, your neck becomes swollen, you cannot swallow your saliva or your voice becomes muffled.  Return with high persistent fever, persistent vomiting, or if you have trouble breathing.  Please return if you have any other emergent concerns.  Additional Information:  Your vital signs today were: BP (!) 175/133 (BP Location: Right Arm)   Pulse 99   Temp 99.6 F (37.6 C)   Resp (!) 24   LMP 02/22/2023   SpO2 97%  If your blood pressure (BP) was elevated above 135/85 this visit, please have this repeated by your doctor within one month. --------------

## 2023-06-19 NOTE — ED Notes (Signed)
Pt refused Covid Swab

## 2023-06-19 NOTE — ED Triage Notes (Signed)
Pt. Stated, I have an abscess on the back of my throat with Rt. Ear pain . Same symptoms that I had my last abscess on my throat.

## 2023-06-28 ENCOUNTER — Emergency Department (HOSPITAL_COMMUNITY): Payer: MEDICAID

## 2023-06-28 ENCOUNTER — Other Ambulatory Visit: Payer: Self-pay

## 2023-06-28 ENCOUNTER — Emergency Department (HOSPITAL_COMMUNITY)
Admission: EM | Admit: 2023-06-28 | Discharge: 2023-06-28 | Disposition: A | Discharge status: Home / Self Care | Payer: MEDICAID | Attending: Emergency Medicine | Admitting: Emergency Medicine

## 2023-06-28 ENCOUNTER — Encounter (HOSPITAL_COMMUNITY): Payer: Self-pay

## 2023-06-28 ENCOUNTER — Other Ambulatory Visit (HOSPITAL_COMMUNITY): Payer: Self-pay

## 2023-06-28 DIAGNOSIS — R030 Elevated blood-pressure reading, without diagnosis of hypertension: Secondary | ICD-10-CM | POA: Insufficient documentation

## 2023-06-28 DIAGNOSIS — K047 Periapical abscess without sinus: Secondary | ICD-10-CM | POA: Insufficient documentation

## 2023-06-28 DIAGNOSIS — D72829 Elevated white blood cell count, unspecified: Secondary | ICD-10-CM | POA: Diagnosis not present

## 2023-06-28 DIAGNOSIS — J36 Peritonsillar abscess: Secondary | ICD-10-CM | POA: Diagnosis not present

## 2023-06-28 DIAGNOSIS — J029 Acute pharyngitis, unspecified: Secondary | ICD-10-CM | POA: Diagnosis present

## 2023-06-28 LAB — COMPREHENSIVE METABOLIC PANEL
ALT: 14 U/L (ref 0–44)
AST: 17 U/L (ref 15–41)
Albumin: 3.7 g/dL (ref 3.5–5.0)
Alkaline Phosphatase: 63 U/L (ref 38–126)
Anion gap: 12 (ref 5–15)
BUN: <5 mg/dL — ABNORMAL LOW (ref 6–20)
CO2: 24 mmol/L (ref 22–32)
Calcium: 10.5 mg/dL — ABNORMAL HIGH (ref 8.9–10.3)
Chloride: 105 mmol/L (ref 98–111)
Creatinine, Ser: 0.66 mg/dL (ref 0.44–1.00)
GFR, Estimated: >60 mL/min (ref >60)
Glucose, Bld: 166 mg/dL — ABNORMAL HIGH (ref 70–99)
Potassium: 3.4 mmol/L — ABNORMAL LOW (ref 3.5–5.1)
Sodium: 141 mmol/L (ref 135–145)
Total Bilirubin: 0.6 mg/dL (ref 0.0–1.2)
Total Protein: 7.8 g/dL (ref 6.5–8.1)

## 2023-06-28 LAB — GROUP A STREP BY PCR: Group A Strep by PCR: NOT DETECTED

## 2023-06-28 LAB — CBC WITH DIFFERENTIAL/PLATELET
Abs Immature Granulocytes: 0.04 10*3/uL (ref 0.00–0.07)
Basophils Absolute: 0.1 10*3/uL (ref 0.0–0.1)
Basophils Relative: 1 %
Eosinophils Absolute: 0.1 10*3/uL (ref 0.0–0.5)
Eosinophils Relative: 1 %
HCT: 47.4 % — ABNORMAL HIGH (ref 36.0–46.0)
Hemoglobin: 15 g/dL (ref 12.0–15.0)
Immature Granulocytes: 0 %
Lymphocytes Relative: 29 %
Lymphs Abs: 3 10*3/uL (ref 0.7–4.0)
MCH: 26.9 pg (ref 26.0–34.0)
MCHC: 31.6 g/dL (ref 30.0–36.0)
MCV: 85.1 fL (ref 80.0–100.0)
Monocytes Absolute: 0.7 10*3/uL (ref 0.1–1.0)
Monocytes Relative: 7 %
Neutro Abs: 6.7 10*3/uL (ref 1.7–7.7)
Neutrophils Relative %: 62 %
Platelets: 266 10*3/uL (ref 150–400)
RBC: 5.57 MIL/uL — ABNORMAL HIGH (ref 3.87–5.11)
RDW: 13.2 % (ref 11.5–15.5)
WBC: 10.7 10*3/uL — ABNORMAL HIGH (ref 4.0–10.5)
nRBC: 0 % (ref 0.0–0.2)

## 2023-06-28 LAB — HCG, SERUM, QUALITATIVE: Preg, Serum: NEGATIVE

## 2023-06-28 LAB — I-STAT CG4 LACTIC ACID, ED: Lactic Acid, Venous: 1.4 mmol/L (ref 0.5–1.9)

## 2023-06-28 MED ORDER — KETOROLAC TROMETHAMINE 15 MG/ML IJ SOLN
15.0000 mg | Freq: Once | INTRAMUSCULAR | Status: AC
  Administered 2023-06-28: 15 mg via INTRAVENOUS
  Filled 2023-06-28: qty 1

## 2023-06-28 MED ORDER — SODIUM CHLORIDE 0.9 % IV SOLN
3.0000 g | Freq: Once | INTRAVENOUS | Status: AC
  Administered 2023-06-28: 3 g via INTRAVENOUS
  Filled 2023-06-28: qty 8

## 2023-06-28 MED ORDER — METHYLPREDNISOLONE 4 MG PO TBPK
ORAL_TABLET | Freq: Every day | ORAL | 0 refills | Status: AC
Start: 2023-06-28 — End: 2023-07-04
  Filled 2023-06-28: qty 21, 6d supply, fill #0

## 2023-06-28 MED ORDER — AMOXICILLIN-POT CLAVULANATE 875-125 MG PO TABS: 1.0000 | ORAL_TABLET | Freq: Two times a day (BID) | ORAL | 0 refills | Status: DC

## 2023-06-28 MED ORDER — AMOXICILLIN-POT CLAVULANATE 875-125 MG PO TABS
1.0000 | ORAL_TABLET | Freq: Two times a day (BID) | ORAL | 0 refills | Status: DC
Start: 2023-06-28 — End: 2023-06-28

## 2023-06-28 MED ORDER — IOHEXOL 350 MG/ML SOLN
75.0000 mL | Freq: Once | INTRAVENOUS | Status: AC | PRN
  Administered 2023-06-28: 75 mL via INTRAVENOUS

## 2023-06-28 MED ORDER — AMOXICILLIN-POT CLAVULANATE 875-125 MG PO TABS
1.0000 | ORAL_TABLET | Freq: Two times a day (BID) | ORAL | 0 refills | Status: DC
Start: 2023-06-28 — End: 2023-08-22
  Filled 2023-06-28 (×2): qty 20, 10d supply, fill #0

## 2023-06-28 MED ORDER — DEXAMETHASONE SODIUM PHOSPHATE 10 MG/ML IJ SOLN
10.0000 mg | Freq: Once | INTRAMUSCULAR | Status: AC
  Administered 2023-06-28: 10 mg via INTRAVENOUS
  Filled 2023-06-28: qty 1

## 2023-06-28 NOTE — Discharge Planning (Signed)
RNCM consulted in regards to medication assistance.  Pt has Medicaid insurance coverage and is not eligible for Medication Assistance Through [facility]) program. RNCM suggests sending Rx to  [facility] to fill and bring to pt at bedside prior to discharge. No further CM needs communicated at this time.

## 2023-06-28 NOTE — ED Notes (Signed)
 Patient returned from CT

## 2023-06-28 NOTE — ED Provider Notes (Signed)
 [place] EMERGENCY DEPARTMENT AT [facility] Provider Note   CSN: [id number] Arrival date & time: 06/28/23  0111     History Chief Complaint  Patient presents with   Sore Throat    Julie [name] is a 48 y.o. female self reportedly otherwise healthy presents emergency room today for evaluation of sore throat for the past 10 to 14 days.  Patient was seen in the emergency department however did not pick up the antibiotics due to cost.  She works that she has been having worsening sore throat.  Is unable to tolerate any food due to pain.  Denies any drooling.  Denies any trouble breathing.  Unknown if she has had any fevers.  No known drug allergies.   Sore Throat Pertinent negatives include no abdominal pain and no shortness of breath.       Home Medications Prior to Admission medications   Medication Sig Start Date End Date Taking? Authorizing Provider  amLODipine  (NORVASC ) 5 MG tablet Take 1 tablet (5 mg total) by mouth daily. 03/22/23 04/21/23  Jacquetta Sharlot GRADE, NP  amoxicillin -clavulanate (AUGMENTIN ) 875-125 MG tablet Take 1 tablet by mouth every 12 (twelve) hours. 06/19/23   Desiderio Chew, PA-C  escitalopram  (LEXAPRO ) 10 MG tablet Take 1 tablet (10 mg total) by mouth daily. 03/22/23 04/21/23  Jacquetta Sharlot GRADE, NP  hydrochlorothiazide  (HYDRODIURIL ) 25 MG tablet Take 1 tablet (25 mg total) by mouth daily. 03/22/23 04/21/23  Jacquetta Sharlot GRADE, NP  lisinopril  (ZESTRIL ) 10 MG tablet Take 1 tablet (10 mg total) by mouth daily. 03/22/23 04/21/23  Jacquetta Sharlot GRADE, NP  metFORMIN  (GLUCOPHAGE ) 500 MG tablet Take 1 tablet (500 mg total) by mouth 2 (two) times daily with a meal. 12/01/22   Smoot, Sarah A, PA-C  miconazole  (MICOTIN) 2 % cream Apply 1 Application topically 2 (two) times daily. Apply in your vagina and around the outside of your vagina twice a day 12/22/22   Zammit, Joseph, MD  naproxen  (NAPROSYN ) 500 MG tablet Take 1 tablet (500 mg total) by mouth 2 (two) times daily.  06/19/23   Geiple, Joshua, PA-C  traZODone  (DESYREL ) 50 MG tablet Take 1 tablet (50 mg total) by mouth at bedtime as needed for sleep. 03/21/23 04/20/23  Jacquetta Sharlot GRADE, NP      Allergies    Patient has no known allergies.    Review of Systems   Review of Systems  Constitutional:  Negative for chills and fever.  HENT:  Positive for sore throat. Negative for drooling.   Respiratory:  Negative for shortness of breath.   Gastrointestinal:  Negative for abdominal pain.    Physical Exam Updated Vital Signs BP (!) 184/113 (BP Location: Right Arm)   Pulse (!) 108   Temp 99.1 F (37.3 C) (Oral)   Resp 18   Ht 5' 11 (1.803 m)   Wt 117.9 kg   LMP 02/22/2023   SpO2 100%   BMI 36.26 kg/m  Physical Exam Vitals and nursing note reviewed.  Constitutional:      Comments: Uncomfortable, no acute distress  HENT:     Nose:     Comments: Significant tonsillar edema and erythema, right greater than left with uvular deviation to the left.  Some exudate notable.  Controlling secretions.  Some trismus present.  Palpitated voice.  No sublingual elevation. Neck:     Comments: Tender right-sided cervical lymphadenopathy. Cardiovascular:     Rate and Rhythm: Tachycardia present.  Pulmonary:     Effort: Pulmonary effort  is normal. No respiratory distress.     Breath sounds: No stridor.  Skin:    General: Skin is warm and dry.  Neurological:     Mental Status: She is alert.     ED Results / Procedures / Treatments   Labs (all labs ordered are listed, but only abnormal results are displayed) Labs Reviewed  COMPREHENSIVE METABOLIC PANEL - Abnormal; Notable for the following components:      Result Value   Potassium 3.4 (*)    Glucose, Bld 166 (*)    BUN <5 (*)    Calcium  10.5 (*)    All other components within normal limits  CBC WITH DIFFERENTIAL/PLATELET - Abnormal; Notable for the following components:   WBC 10.7 (*)    RBC 5.57 (*)    HCT 47.4 (*)    All other components within  normal limits  GROUP A STREP BY PCR  CULTURE, BLOOD (ROUTINE X 2)  CULTURE, BLOOD (ROUTINE X 2)  HCG, SERUM, QUALITATIVE  I-STAT CG4 LACTIC ACID, ED  I-STAT CG4 LACTIC ACID, ED    EKG None  Radiology CT Soft Tissue Neck W Contrast Result Date: 06/28/2023 CLINICAL DATA:  Initial evaluation for acute tonsillitis or epiglottitis. EXAM: CT NECK WITH CONTRAST TECHNIQUE: Multidetector CT imaging of the neck was performed using the standard protocol following the bolus administration of intravenous contrast. RADIATION DOSE REDUCTION: This exam was performed according to the departmental dose-optimization program which includes automated exposure control, adjustment of the mA and/or kV according to patient size and/or use of iterative reconstruction technique. CONTRAST:  75mL OMNIPAQUE  IOHEXOL  350 MG/ML SOLN COMPARISON:  Prior CT from 11/11/2022. FINDINGS: Pharynx and larynx: Oral cavity within normal limits. Prominence and enlargement of the right greater than left palatine tonsils and adenoidal soft tissues, suggesting acute tonsillitis. Superimposed hypodense collection at the lateral margin of the right tonsil measures 3.3 x 2.1 x 1.8 cm, consistent with tonsillar/peritonsillar abscess (series 3, image 58). The anterior margin of this collection appears to possibly track anteriorly towards the posterior right mandibular body (series 3, image 58). Finding raises the possibility for infection related to underlying dental disease within abscess tracking posteriorly from the right mandible. Underlying poor dentition with multiple scattered dental caries and periapical lucencies about the remaining teeth noted. Uvula is mildly swollen and edematous. Mild mucosal edema about the right pharynx, suggesting concomitant pharyngitis. No retropharyngeal collection. Epiglottis within normal limits. Supraglottic airway remains widely patent. Glottis within normal limits. Subglottic airway patent clear. Salivary glands:  Salivary glands including the parotid and submandibular glands are within normal limits. Thyroid : Normal. Lymph nodes: Asymmetric prominence of right upper cervical lymph nodes, largest of which measures 1.2 cm in short axis at right level 2, presumably reactive. Vascular: Right vertebral artery appears hypoplastic and is not well seen, possibly occluded. Otherwise normal intravascular enhancement seen within the neck. Limited intracranial: Grossly unremarkable, although evaluation limited by motion. Visualized orbits: Grossly unremarkable, although evaluation limited by motion. Mastoids and visualized paranasal sinuses: Mild mucosal thickening present about the maxillary sinuses. Visualized paranasal sinuses are otherwise clear. Mastoid air cells and middle ear cavities are clear. Skeleton: No discrete or worrisome osseous lesions. Upper chest: No other acute finding. Other: 1.9 cm probable sebaceous cyst noted within the subcutaneous fat of the right upper chest wall. IMPRESSION: 1. Findings suggestive of acute tonsillitis/pharyngitis, with superimposed 3.3 x 2.1 x 1.8 cm right tonsillar/peritonsillar abscess. The anterior margin of this collection appears to possibly track anteriorly towards the posterior right  mandibular body. Finding raises the possibility for infection related to underlying dental disease within abscess tracking posteriorly from the right mandible. Correlation with physical exam recommended. 2. Asymmetric prominence of right upper cervical lymph nodes, presumably reactive. 3. 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 Electronically Signed   By: Morene Hoard M.D.   On: 06/28/2023 03:15    Procedures Procedures   Medications Ordered in ED Medications  dexamethasone  (DECADRON ) injection 10 mg (has no administration in time range)  ketorolac  (TORADOL ) 15 MG/ML injection 15 mg (has no administration in time range)    ED Course/ Medical Decision  Making/ A&P    Medical Decision Making Amount and/or Complexity of Data Reviewed Labs: ordered. Radiology: ordered.  Risk Prescription drug management.   48 y.o. female presents to the ER for evaluation of sore throat. Differential diagnosis includes but is not limited to Viral pharyngitis, strep pharyngitis, dental caries/abscess, esophagitis, sinusitis, post nasal drip, reflux, angioedema, RTA/PTA, Ludwig's angina. Vital signs elevated BP and tachycardia 110, otherwise unremarkable. Physical exam as noted above.   I have ordered the patient some Decadron , Toradol , and Unasyn .  I independently reviewed and interpreted the patient's labs.  CMP shows mildly decreased potassium 3.4.  Glucose at 166, BUN less than 5 with a calcium  of 10.5.  Prescribed calcium  so is mildly elevated.  No other electrolyte or LFT abnormality.  CBC shows mild leukocytosis with a white blood cell count of 10.7.  Hemoglobin normal limits.  Mildly elevated hematocrit at 47.4.  Strep is negative.  Pregnancy is negative.  I-STAT lactic within normal limits at 1.4.  Blood cultures pending.  CT imaging shows  1. Findings suggestive of acute tonsillitis/pharyngitis, with superimposed 3.3 x 2.1 x 1.8 cm right tonsillar/peritonsillar abscess. The anterior margin of this collection appears to possibly track anteriorly towards the posterior right mandibular body. Finding raises the possibility for infection related to underlying dental disease within abscess tracking posteriorly from the right mandible. Correlation with physical exam recommended. 2. Asymmetric prominence of right upper cervical lymph nodes, presumably reactive. 3. 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 Per radiologist's interpretation.    Consult placed to ENT.  ENT is currently in the OR with an emergent patient. Will hand off to oncoming shift to repage ENT as the patient will need an I&D.   Byers drained at bedside. Will  send her in Augmentin  to [facility] pharmacy for patient assistance.  Will handoff to oncoming shift to evaluate for any bleeding.  She will also need to follow-up with a dentist as well.  Information gleaned the discharge report.  6:14 AM Care of VIOLET SEABURY transferred to [facility] at the end of my shift as the patient will require reassessment once labs/imaging have resulted. Patient presentation, ED course, and plan of care discussed with review of all pertinent labs and imaging. Please see his/her note for further details regarding further ED course and disposition. Plan at time of handoff is follow-up after incision and drainage.  Make sure pain is controlled as well as no extended bleeding. This may be altered or completely changed at the discretion of the oncoming team pending results of further workup.  Portions of this report may have been transcribed using voice recognition software. Every effort was made to ensure accuracy; however, inadvertent computerized transcription errors may be present.   Final Clinical Impression(s) / ED Diagnoses Final diagnoses:  Peritonsillar abscess  Dental abscess  Elevated BP without diagnosis  of hypertension    Rx / DC Orders ED Discharge Orders     None         Bernis Ernst, [place] 06/28/23 9350    Melvenia Motto, MD 06/28/23 [phone]

## 2023-06-28 NOTE — ED Notes (Signed)
 Patient transported to CT

## 2023-06-28 NOTE — ED Triage Notes (Signed)
 Patient came from home with a diagnosis of tonsillitis x2days. Patient has not taken her antibiotics or other medications prescribed for treatment. States had no access to them. Woke up and felt like she could not breath.  EMS VS 220/140 125 18 96% 205 CBG

## 2023-06-28 NOTE — Discharge Instructions (Addendum)
 You were seen in the ER today for evaluation of your sore throat.  You had a peritonsillar abscess that was drained.  I am glad that you are feeling better.  I have sent you home on Augmentin  and prednisone .  Please take as prescribed.  It is extremely important that you take the medications and complete them to the entirety of the course.  I have included ration for Dr. Roark you to follow-up with in 2 weeks.  For pain, you take Tylenol  ibuprofen  as needed.  If you have any concerns, new or worsening symptoms, please return to the nearest emergency department reevaluation.  You will also need to follow-up with a dentist.  I have included information for dentists in your area.  Please call to schedule an appointment.  Contact a doctor if: You have more pain, swelling, redness, or pus in your throat. You have a headache. You have low energy or feel generally sick. You have a fever or chills. You have trouble swallowing or eating. You have signs of not enough water in the body (dehydration), such as: Feeling light-headed or dizzy when you are standing. Peeing (urinating) less than usual. A fast heart rate. Dry mouth. Get help right away if: You are unable to swallow. You have trouble breathing. Breathing is easier when you lean forward. You cough up blood. You vomit blood. You have very bad throat pain and it does not get better with medicine. These symptoms may be an emergency. Get help right away. Call your local emergency services (911 in the [place] Do not wait to see if the symptoms will go away. Do not drive yourself to the hospital.

## 2023-06-28 NOTE — ED Provider Notes (Signed)
 Care assumed from [facility], PA-C at shift change pending observation and TOC assistance for medications.  See her note for full HPI.  In short, patient is a 48 year old female who presents to the ED due to sore throat x 2 weeks.  Patient was seen in the ED previously however, and was unable to pick up her antibiotics due to financial restraints.   At shift change, patient pending observation and TOC consult for antibiotic assistance. Physical Exam  BP (!) 160/116   Pulse 84   Temp 98.9 F (37.2 C)   Resp 20   Ht 5' 11 (1.803 m)   Wt 117.9 kg   LMP 02/22/2023   SpO2 99%   BMI 36.26 kg/m   Physical Exam Vitals and nursing note reviewed.  Constitutional:      General: She is not in acute distress.    Appearance: She is not ill-appearing.  HENT:     Head: Normocephalic.     Mouth/Throat:     Comments: S/p I&D. Tonsillar hypertrophy. Erythema. Tolerating oral secretions without difficulty.  Eyes:     Pupils: Pupils are equal, round, and reactive to light.  Cardiovascular:     Rate and Rhythm: Normal rate and regular rhythm.     Pulses: Normal pulses.     Heart sounds: Normal heart sounds. No murmur heard.    No friction rub. No gallop.  Pulmonary:     Effort: Pulmonary effort is normal.     Breath sounds: Normal breath sounds.  Abdominal:     General: Abdomen is flat. There is no distension.     Palpations: Abdomen is soft.     Tenderness: There is no abdominal tenderness. There is no guarding or rebound.  Musculoskeletal:        General: Normal range of motion.     Cervical back: Neck supple.  Skin:    General: Skin is warm and dry.  Neurological:     General: No focal deficit present.     Mental Status: She is alert.  Psychiatric:        Mood and Affect: Mood normal.        Behavior: Behavior normal.     Procedures  Procedures  ED Course / MDM    Medical Decision Making Amount and/or Complexity of Data Reviewed Labs: ordered. Radiology:  ordered.  Risk Prescription drug management.   Care assumed from [facility], PA-C at shift change pending observation and TOC assistance for medications.   7:45 AM Patient resting comfortably in bed. Tolerating oral secretions wihout difficulty. Awaiting TOC assistance for medications.   10:16 AM reassessed patient at bedside.  Patient tolerating oral secretions without difficulty.  Lungs clear to auscultation bilaterally.  No stridor or wheeze.  No evidence of respiratory distress.  Patient given antibiotics and steroids per previous provider by TOC.  Discharge paperwork per previous provider.  Patient stable for discharge. Strict ED precautions discussed with patient. Patient states understanding and agrees to plan. Patient discharged home in no acute distress and stable vitals      Lorelle Aleck JAYSON DEVONNA 06/28/23 1018    Melvenia Motto, MD 06/29/23 0730

## 2023-06-28 NOTE — Consult Note (Signed)
 Reason for Consult:PTA Referring Physician: Dr Melvenia Setter Julie [name] is an 47 y.o. female.  HPI: hx of few days of sore throat and previous tonsillitis. She doesn't have repetitive tonsil issues. She has CT scan with right PTA. Maybe tooth involvement  Past Medical History:  Diagnosis Date   Asthma    Fracture, humerus closed, shaft 07/11/2014   left   History of bronchitis    Mental health problem    Open fracture of great toe of left foot 12/25/2015    Past Surgical History:  Procedure Laterality Date   CESAREAN SECTION     ECTOPIC PREGNANCY SURGERY  2003   INCISION AND DRAINAGE Left 12/25/2015   Procedure: INCISION AND DRAINAGE With  Amputation of distal tip of Left Great and Second Toe.;  Surgeon: Kay CHRISTELLA Cummins, MD;  Location: [facility];  Service: Orthopedics;  Laterality: Left;   LIPOMA EXCISION     chest   ORIF HUMERUS FRACTURE Left 07/15/2014   Procedure: OPEN REDUCTION INTERNAL FIXATION (ORIF) LEFT HUMERAL SHAFT ;  Surgeon: Evalene JONETTA Chancy, MD;  Location: [facility];  Service: Orthopedics;  Laterality: Left;   TUBAL LIGATION  2003    History reviewed. No pertinent family history.  Social History:  reports that she has been smoking cigarettes. She has a 9 pack-year smoking history. She has never used smokeless tobacco. She reports current alcohol  use. She reports current drug use. Drugs: Marijuana, Crack cocaine, and MDMA (Ecstacy).  Allergies: No Known Allergies  Medications: Julie have reviewed the patient's current medications.  Results for orders placed or performed during the hospital encounter of 06/28/23 (from the past 48 hours)  Julie-Stat CG4 Lactic Acid     Status: None   Collection Time: 06/28/23  1:43 AM  Result Value Ref Range   Lactic Acid, Venous 1.4 0.5 - 1.9 mmol/L  hCG, serum, qualitative     Status: None   Collection Time: 06/28/23  1:43 AM  Result Value Ref Range   Preg, Serum NEGATIVE NEGATIVE    Comment:        THE SENSITIVITY OF  THIS METHODOLOGY IS >10 mIU/mL. Performed at [facility], [address] [place], [place] [address]   Comprehensive metabolic panel     Status: Abnormal   Collection Time: 06/28/23  1:43 AM  Result Value Ref Range   Sodium 141 135 - 145 mmol/L   Potassium 3.4 (L) 3.5 - 5.1 mmol/L   Chloride 105 98 - 111 mmol/L   CO2 24 22 - 32 mmol/L   Glucose, Bld 166 (H) 70 - 99 mg/dL    Comment: Glucose reference range applies only to samples taken after fasting for at least 8 hours.   BUN <5 (L) 6 - 20 mg/dL   Creatinine, Ser 9.33 0.44 - 1.00 mg/dL   Calcium  10.5 (H) 8.9 - 10.3 mg/dL   Total Protein 7.8 6.5 - 8.1 g/dL   Albumin 3.7 3.5 - 5.0 g/dL   AST 17 15 - 41 U/L   ALT 14 0 - 44 U/L   Alkaline Phosphatase 63 38 - 126 U/L   Total Bilirubin 0.6 0.0 - 1.2 mg/dL   GFR, Estimated >39 >39 mL/min    Comment: (NOTE) Calculated using the CKD-EPI Creatinine Equation (2021)    Anion gap 12 5 - 15    Comment: Performed at [facility], [address], [place], [place] [address]  CBC with Differential     Status:  Abnormal   Collection Time: 06/28/23  1:43 AM  Result Value Ref Range   WBC 10.7 (H) 4.0 - 10.5 K/uL   RBC 5.57 (H) 3.87 - 5.11 MIL/uL   Hemoglobin 15.0 12.0 - 15.0 g/dL   HCT 52.5 (H) 63.9 - 53.9 %   MCV 85.1 80.0 - 100.0 fL   MCH 26.9 26.0 - 34.0 pg   MCHC 31.6 30.0 - 36.0 g/dL   RDW 86.7 88.4 - 84.4 %   Platelets 266 150 - 400 K/uL   nRBC 0.0 0.0 - 0.2 %   Neutrophils Relative % 62 %   Neutro Abs 6.7 1.7 - 7.7 K/uL   Lymphocytes Relative 29 %   Lymphs Abs 3.0 0.7 - 4.0 K/uL   Monocytes Relative 7 %   Monocytes Absolute 0.7 0.1 - 1.0 K/uL   Eosinophils Relative 1 %   Eosinophils Absolute 0.1 0.0 - 0.5 K/uL   Basophils Relative 1 %   Basophils Absolute 0.1 0.0 - 0.1 K/uL   Immature Granulocytes 0 %   Abs Immature Granulocytes 0.04 0.00 - 0.07 K/uL    Comment: Performed at [facility], [address], [place], [place] [address]  Group A Strep by PCR      Status: None   Collection Time: 06/28/23  2:12 AM   Specimen: Throat; Sterile Swab  Result Value Ref Range   Group A Strep by PCR NOT DETECTED NOT DETECTED    Comment: Performed at [facility], [address] [place], [place] [address]    CT Soft Tissue Neck W Contrast Result Date: 06/28/2023 CLINICAL DATA:  Initial evaluation for acute tonsillitis or epiglottitis. EXAM: CT NECK WITH CONTRAST TECHNIQUE: Multidetector CT imaging of the neck was performed using the standard protocol following the bolus administration of intravenous contrast. RADIATION DOSE REDUCTION: This exam was performed according to the departmental dose-optimization program which includes automated exposure control, adjustment of the mA and/or kV according to patient size and/or use of iterative reconstruction technique. CONTRAST:  75mL OMNIPAQUE  IOHEXOL  350 MG/ML SOLN COMPARISON:  Prior CT from 11/11/2022. FINDINGS: Pharynx and larynx: Oral cavity within normal limits. Prominence and enlargement of the right greater than left palatine tonsils and adenoidal soft tissues, suggesting acute tonsillitis. Superimposed hypodense collection at the lateral margin of the right tonsil measures 3.3 x 2.1 x 1.8 cm, consistent with tonsillar/peritonsillar abscess (series 3, image 58). The anterior margin of this collection appears to possibly track anteriorly towards the posterior right mandibular body (series 3, image 58). Finding raises the possibility for infection related to underlying dental disease within abscess tracking posteriorly from the right mandible. Underlying poor dentition with multiple scattered dental caries and periapical lucencies about the remaining teeth noted. Uvula is mildly swollen and edematous. Mild mucosal edema about the right pharynx, suggesting concomitant pharyngitis. No retropharyngeal collection. Epiglottis within normal limits. Supraglottic airway remains widely patent. Glottis within normal limits.  Subglottic airway patent clear. Salivary glands: Salivary glands including the parotid and submandibular glands are within normal limits. Thyroid : Normal. Lymph nodes: Asymmetric prominence of right upper cervical lymph nodes, largest of which measures 1.2 cm in short axis at right level 2, presumably reactive. Vascular: Right vertebral artery appears hypoplastic and is not well seen, possibly occluded. Otherwise normal intravascular enhancement seen within the neck. Limited intracranial: Grossly unremarkable, although evaluation limited by motion. Visualized orbits: Grossly unremarkable, although evaluation limited by motion. Mastoids and visualized paranasal sinuses: Mild mucosal thickening present about the maxillary sinuses.  Visualized paranasal sinuses are otherwise clear. Mastoid air cells and middle ear cavities are clear. Skeleton: No discrete or worrisome osseous lesions. Upper chest: No other acute finding. Other: 1.9 cm probable sebaceous cyst noted within the subcutaneous fat of the right upper chest wall. IMPRESSION: 1. Findings suggestive of acute tonsillitis/pharyngitis, with superimposed 3.3 x 2.1 x 1.8 cm right tonsillar/peritonsillar abscess. The anterior margin of this collection appears to possibly track anteriorly towards the posterior right mandibular body. Finding raises the possibility for infection related to underlying dental disease within abscess tracking posteriorly from the right mandible. Correlation with physical exam recommended. 2. Asymmetric prominence of right upper cervical lymph nodes, presumably reactive. 3. 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 Electronically Signed   By: Morene Hoard M.D.   On: 06/28/2023 03:15    ROS Blood pressure (!) 160/116, pulse 84, temperature 98.9 F (37.2 C), resp. rate 20, height 5' 11 (1.803 m), weight 117.9 kg, last menstrual period 02/22/2023, SpO2 99%. Physical Exam HENT:     Head:      Comments: Right bulging and erythema. Opens mouth well. No airway issues    Right Ear: Ear canal normal.     Left Ear: Ear canal normal.     Mouth/Throat:     Mouth: Mucous membranes are moist.  Eyes:     Conjunctiva/sclera: Conjunctivae normal.  Musculoskeletal:     Cervical back: Normal range of motion.       Assessment/Plan: Pt now needs Julie/D as she has failed medical therapy. We discussed Julie/D and risks, benefits and options,. All questions answered and consent obtained. The OP sprayed with hurricane and injected the peritonsillar area with 1% lido with epi 2 cc. An incision made in the right peritonsillar area and tonsil hemastat opened the space with pus expressed. Pus immediately came out with the cut. Minimal bleeding and she tolerated it well. She  will get antibiotics by ER and f/u in 2 weeks. She should be almost completely better in 2-3 days and if not return   Julie [name] 06/28/2023, 5:52 AM

## 2023-06-30 ENCOUNTER — Other Ambulatory Visit (HOSPITAL_COMMUNITY): Payer: Self-pay

## 2023-07-03 LAB — CULTURE, BLOOD (ROUTINE X 2)
Culture: NO GROWTH
Culture: NO GROWTH
Special Requests: ADEQUATE
Special Requests: ADEQUATE

## 2023-08-21 ENCOUNTER — Other Ambulatory Visit: Payer: Self-pay

## 2023-08-21 ENCOUNTER — Emergency Department (HOSPITAL_COMMUNITY)
Admission: EM | Admit: 2023-08-21 | Discharge: 2023-08-21 | Disposition: A | Discharge status: Home / Self Care | Payer: MEDICAID | Source: Home / Self Care | Attending: Emergency Medicine | Admitting: Emergency Medicine

## 2023-08-21 ENCOUNTER — Encounter (HOSPITAL_COMMUNITY): Payer: Self-pay

## 2023-08-21 ENCOUNTER — Emergency Department (HOSPITAL_COMMUNITY): Payer: MEDICAID

## 2023-08-21 ENCOUNTER — Inpatient Hospital Stay (HOSPITAL_COMMUNITY)
Admission: EM | Admit: 2023-08-21 | Discharge: 2023-08-25 | DRG: 202 | Disposition: A | Discharge status: Home / Self Care | Payer: MEDICAID | Attending: Family Medicine | Admitting: Family Medicine

## 2023-08-21 DIAGNOSIS — Z720 Tobacco use: Secondary | ICD-10-CM

## 2023-08-21 DIAGNOSIS — R059 Cough, unspecified: Secondary | ICD-10-CM | POA: Insufficient documentation

## 2023-08-21 DIAGNOSIS — Z5900 Homelessness unspecified: Secondary | ICD-10-CM

## 2023-08-21 DIAGNOSIS — F1490 Cocaine use, unspecified, uncomplicated: Secondary | ICD-10-CM | POA: Diagnosis present

## 2023-08-21 DIAGNOSIS — I110 Hypertensive heart disease with heart failure: Secondary | ICD-10-CM | POA: Diagnosis present

## 2023-08-21 DIAGNOSIS — F17210 Nicotine dependence, cigarettes, uncomplicated: Secondary | ICD-10-CM | POA: Insufficient documentation

## 2023-08-21 DIAGNOSIS — T380X5A Adverse effect of glucocorticoids and synthetic analogues, initial encounter: Secondary | ICD-10-CM | POA: Diagnosis present

## 2023-08-21 DIAGNOSIS — J4489 Other specified chronic obstructive pulmonary disease: Secondary | ICD-10-CM | POA: Diagnosis present

## 2023-08-21 DIAGNOSIS — E669 Obesity, unspecified: Secondary | ICD-10-CM | POA: Insufficient documentation

## 2023-08-21 DIAGNOSIS — J45901 Unspecified asthma with (acute) exacerbation: Principal | ICD-10-CM | POA: Diagnosis present

## 2023-08-21 DIAGNOSIS — I5033 Acute on chronic diastolic (congestive) heart failure: Secondary | ICD-10-CM | POA: Diagnosis present

## 2023-08-21 DIAGNOSIS — Z1152 Encounter for screening for COVID-19: Secondary | ICD-10-CM

## 2023-08-21 DIAGNOSIS — R739 Hyperglycemia, unspecified: Secondary | ICD-10-CM

## 2023-08-21 DIAGNOSIS — F325 Major depressive disorder, single episode, in full remission: Secondary | ICD-10-CM | POA: Diagnosis present

## 2023-08-21 DIAGNOSIS — I10 Essential (primary) hypertension: Secondary | ICD-10-CM

## 2023-08-21 DIAGNOSIS — I2489 Other forms of acute ischemic heart disease: Secondary | ICD-10-CM | POA: Diagnosis present

## 2023-08-21 DIAGNOSIS — E1165 Type 2 diabetes mellitus with hyperglycemia: Secondary | ICD-10-CM | POA: Diagnosis not present

## 2023-08-21 DIAGNOSIS — Z7952 Long term (current) use of systemic steroids: Secondary | ICD-10-CM

## 2023-08-21 DIAGNOSIS — J45909 Unspecified asthma, uncomplicated: Secondary | ICD-10-CM | POA: Insufficient documentation

## 2023-08-21 DIAGNOSIS — E876 Hypokalemia: Secondary | ICD-10-CM | POA: Insufficient documentation

## 2023-08-21 DIAGNOSIS — R0902 Hypoxemia: Secondary | ICD-10-CM | POA: Diagnosis present

## 2023-08-21 DIAGNOSIS — Z6838 Body mass index (BMI) 38.0-38.9, adult: Secondary | ICD-10-CM | POA: Insufficient documentation

## 2023-08-21 DIAGNOSIS — R7989 Other specified abnormal findings of blood chemistry: Principal | ICD-10-CM | POA: Insufficient documentation

## 2023-08-21 DIAGNOSIS — I509 Heart failure, unspecified: Secondary | ICD-10-CM

## 2023-08-21 DIAGNOSIS — J4521 Mild intermittent asthma with (acute) exacerbation: Secondary | ICD-10-CM

## 2023-08-21 DIAGNOSIS — I5A Non-ischemic myocardial injury (non-traumatic): Secondary | ICD-10-CM | POA: Diagnosis present

## 2023-08-21 DIAGNOSIS — E119 Type 2 diabetes mellitus without complications: Secondary | ICD-10-CM | POA: Insufficient documentation

## 2023-08-21 DIAGNOSIS — R0602 Shortness of breath: Secondary | ICD-10-CM

## 2023-08-21 DIAGNOSIS — F332 Major depressive disorder, recurrent severe without psychotic features: Secondary | ICD-10-CM | POA: Diagnosis present

## 2023-08-21 DIAGNOSIS — R600 Localized edema: Secondary | ICD-10-CM | POA: Insufficient documentation

## 2023-08-21 DIAGNOSIS — E118 Type 2 diabetes mellitus with unspecified complications: Secondary | ICD-10-CM

## 2023-08-21 HISTORY — DX: Type 2 diabetes mellitus without complications: E11.9

## 2023-08-21 LAB — CBC WITH DIFFERENTIAL/PLATELET
Abs Immature Granulocytes: 0.04 10*3/uL (ref 0.00–0.07)
Basophils Absolute: 0 10*3/uL (ref 0.0–0.1)
Basophils Relative: 0 %
Eosinophils Absolute: 0.1 10*3/uL (ref 0.0–0.5)
Eosinophils Relative: 1 %
HCT: 43.2 % (ref 36.0–46.0)
Hemoglobin: 13.7 g/dL (ref 12.0–15.0)
Immature Granulocytes: 0 %
Lymphocytes Relative: 15 %
Lymphs Abs: 1.4 10*3/uL (ref 0.7–4.0)
MCH: 27.1 pg (ref 26.0–34.0)
MCHC: 31.7 g/dL (ref 30.0–36.0)
MCV: 85.4 fL (ref 80.0–100.0)
Monocytes Absolute: 0.4 10*3/uL (ref 0.1–1.0)
Monocytes Relative: 4 %
Neutro Abs: 7.5 10*3/uL (ref 1.7–7.7)
Neutrophils Relative %: 80 %
Platelets: 197 10*3/uL (ref 150–400)
RBC: 5.06 MIL/uL (ref 3.87–5.11)
RDW: 14.7 % (ref 11.5–15.5)
WBC: 9.3 10*3/uL (ref 4.0–10.5)
nRBC: 0 % (ref 0.0–0.2)

## 2023-08-21 LAB — BASIC METABOLIC PANEL
Anion gap: 14 (ref 5–15)
BUN: 7 mg/dL (ref 6–20)
CO2: 18 mmol/L — ABNORMAL LOW (ref 22–32)
Calcium: 9.9 mg/dL (ref 8.9–10.3)
Chloride: 111 mmol/L (ref 98–111)
Creatinine, Ser: 0.61 mg/dL (ref 0.44–1.00)
GFR, Estimated: >60 mL/min (ref >60)
Glucose, Bld: 194 mg/dL — ABNORMAL HIGH (ref 70–99)
Potassium: 4.5 mmol/L (ref 3.5–5.1)
Sodium: 143 mmol/L (ref 135–145)

## 2023-08-21 LAB — RESP PANEL BY RT-PCR (RSV, FLU A&B, COVID)  RVPGX2
Influenza A by PCR: NEGATIVE
Influenza B by PCR: NEGATIVE
Resp Syncytial Virus by PCR: NEGATIVE
SARS Coronavirus 2 by RT PCR: NEGATIVE

## 2023-08-21 LAB — TROPONIN I (HIGH SENSITIVITY): Troponin I (High Sensitivity): 21 ng/L — ABNORMAL HIGH (ref <18)

## 2023-08-21 MED ORDER — PREDNISONE 50 MG PO TABS: 50.0000 mg | ORAL_TABLET | Freq: Every day | ORAL | 0 refills | Status: DC

## 2023-08-21 MED ORDER — AMLODIPINE BESYLATE 5 MG PO TABS: 5.0000 mg | ORAL_TABLET | Freq: Every day | ORAL | 2 refills | Status: DC

## 2023-08-21 MED ORDER — IPRATROPIUM-ALBUTEROL 0.5-2.5 (3) MG/3ML IN SOLN
3.0000 mL | Freq: Once | RESPIRATORY_TRACT | Status: AC
  Administered 2023-08-21: 3 mL via RESPIRATORY_TRACT
  Filled 2023-08-21: qty 3

## 2023-08-21 MED ORDER — METHYLPREDNISOLONE SODIUM SUCC 125 MG IJ SOLR
125.0000 mg | Freq: Once | INTRAMUSCULAR | Status: AC
  Administered 2023-08-21: 125 mg via INTRAVENOUS
  Filled 2023-08-21: qty 2

## 2023-08-21 MED ORDER — ALBUTEROL SULFATE HFA 108 (90 BASE) MCG/ACT IN AERS
1.0000 | INHALATION_SPRAY | RESPIRATORY_TRACT | Status: DC | PRN
  Administered 2023-08-21: 2 via RESPIRATORY_TRACT
  Filled 2023-08-21 (×2): qty 6.7

## 2023-08-21 MED ORDER — AMLODIPINE BESYLATE 5 MG PO TABS
5.0000 mg | ORAL_TABLET | Freq: Once | ORAL | Status: AC
  Administered 2023-08-21: 5 mg via ORAL
  Filled 2023-08-21: qty 1

## 2023-08-21 MED ORDER — AEROCHAMBER PLUS FLO-VU MEDIUM MISC
1.0000 | Freq: Once | Status: DC
  Filled 2023-08-21: qty 1

## 2023-08-21 MED ORDER — AEROCHAMBER PLUS FLO-VU LARGE MISC
1.0000 | Freq: Once | Status: AC
  Administered 2023-08-21: 1

## 2023-08-21 NOTE — ED Triage Notes (Signed)
 Pt c.o 6 days of chills, body aches, congestion, cough.

## 2023-08-21 NOTE — Care Management (Signed)
 Consult for PCP and medication assistance. The patient has insurance therefore is not eligible for [facility] medication assistance program. PCP placed on AVS for patient to call Monday to establish a primary doctor.

## 2023-08-21 NOTE — ED Provider Notes (Signed)
  EMERGENCY DEPARTMENT AT [facility] Provider Note   CSN: [id number] Arrival date & time: 08/21/23  0813     History  Chief Complaint  Patient presents with   Cough   Nasal Congestion   Generalized Body Aches    Julie [name] is a 48 y.o. female.  Pt is a 48 yo female with pmhx significant for asthma, dm, and polysubstance abuse.  Pt said she's had sob and cough for the past 6 days.  She has tightness in her chest.  She does not know if she's had a fever.  She does smoke cigarettes, but has not been able to smoke as much for the last few days due to sob.       Home Medications Prior to Admission medications   Medication Sig Start Date End Date Taking? Authorizing Provider  amoxicillin-clavulanate (AUGMENTIN) 875-125 MG tablet Take 1 tablet by mouth every 12 (twelve) hours. Patient not taking: Reported on 06/28/2023 06/28/23   Mannie Stabile, PA-C  ibuprofen (ADVIL) 200 MG tablet Take 800 mg by mouth every 6 (six) hours as needed for fever, moderate pain (pain score 4-6) or headache.    [provider]  naproxen (NAPROSYN) 500 MG tablet Take 1 tablet (500 mg total) by mouth 2 (two) times daily. Patient not taking: Reported on 06/28/2023 06/19/23   Renne Crigler, PA-C      Allergies    Patient has no known allergies.    Review of Systems   Review of Systems  Respiratory:  Positive for cough, shortness of breath and wheezing.   Cardiovascular:  Positive for chest pain.  All other systems reviewed and are negative.   Physical Exam Updated Vital Signs BP (!) 178/110   Pulse 93   Temp 98.8 F (37.1 C) (Oral)   Resp (!) 21   Ht 5\' 10"  (1.778 m)   Wt 120.2 kg   SpO2 99%   BMI 38.02 kg/m  Physical Exam Vitals and nursing note reviewed.  Constitutional:      Appearance: Normal appearance. She is obese.  HENT:     Head: Normocephalic and atraumatic.     Right Ear: External ear normal.     Left Ear: External ear normal.     Nose:  Nose normal.     Mouth/Throat:     Mouth: Mucous membranes are moist.     Pharynx: Oropharynx is clear.  Eyes:     Extraocular Movements: Extraocular movements intact.     Conjunctiva/sclera: Conjunctivae normal.     Pupils: Pupils are equal, round, and reactive to light.  Cardiovascular:     Rate and Rhythm: Normal rate and regular rhythm.     Pulses: Normal pulses.     Heart sounds: Normal heart sounds.  Pulmonary:     Effort: Pulmonary effort is normal.     Breath sounds: Wheezing present.  Abdominal:     General: Abdomen is flat. Bowel sounds are normal.     Palpations: Abdomen is soft.  Musculoskeletal:        General: Normal range of motion.     Cervical back: Normal range of motion and neck supple.  Skin:    General: Skin is warm.     Capillary Refill: Capillary refill takes less than 2 seconds.  Neurological:     General: No focal deficit present.     Mental Status: She is alert and oriented to person, place, and time.  Psychiatric:  Mood and Affect: Mood normal.        Behavior: Behavior normal.     ED Results / Procedures / Treatments   Labs (all labs ordered are listed, but only abnormal results are displayed) Labs Reviewed  RESP PANEL BY RT-PCR (RSV, FLU A&B, COVID)  [id number]  BASIC METABOLIC PANEL  CBC WITH DIFFERENTIAL/PLATELET  TROPONIN I (HIGH SENSITIVITY)    EKG EKG Interpretation Date/Time:  Sunday August 21 2023 08:41:13 EST Ventricular Rate:  88 PR Interval:  129 QRS Duration:  96 QT Interval:  412 QTC Calculation: 499 R Axis:   -47  Text Interpretation: Sinus rhythm LAE, consider biatrial enlargement Left anterior fascicular block Abnormal T, consider ischemia, lateral leads ST elev, probable normal early repol pattern t wave inversions are new Confirmed by Jacalyn Lefevre [phone]) on 08/21/2023 8:44:19 AM  Radiology No results found.  Procedures Procedures    Medications Ordered in ED Medications  methylPREDNISolone sodium  succinate (SOLU-MEDROL) 125 mg/2 mL injection 125 mg (has no administration in time range)  ipratropium-albuterol (DUONEB) 0.5-2.5 (3) MG/3ML nebulizer solution 3 mL (has no administration in time range)    ED Course/ Medical Decision Making/ A&P                                 Medical Decision Making Amount and/or Complexity of Data Reviewed Labs: ordered. Radiology: ordered.  Risk Prescription drug management.   This patient presents to the ED for concern of sob, this involves an extensive number of treatment options, and is a complaint that carries with it a high risk of complications and morbidity.  The differential diagnosis includes asthma exac, covid/flu/rsv, pna, cardiac   Co morbidities that complicate the patient evaluation  asthma, dm, and polysubstance abuse   Additional history obtained:  Additional history obtained from epic chart review External records from outside source obtained and reviewed including sig other   Lab Tests:  I Ordered, and personally interpreted labs.  The pertinent results include:  bmp with glucose elevated at 194; trop 21, covid/flu/rsv neg; cbc nl   Imaging Studies ordered:  I ordered imaging studies including cxr  I independently visualized and interpreted imaging which showed No active disease.  I agree with the radiologist interpretation   Medicines ordered and prescription drug management:  I ordered medication including solumedrol/duoneb  for sob; norvasc for htn  Reevaluation of the patient after these medicines showed that the patient improved I have reviewed the patients home medicines and have made adjustments as needed    Consultations Obtained:  I requested consultation with TOC who will help with pcp.  Problem List / ED Course:  HTN:  pt said she has been told she's hypertensive, but does not have a pcp.  She has never been on meds for htn in the past.  She's been here several times and bp is always elevated.   Pt will be started on amlodipine and we talked about the DASH diet.  She is instructed to establish care with pcp. Tobacco abuse:  pt encouraged to stop smoking Hyperglycemia:  pt encouraged to eat a healthier diet SOB:  likely rad.  Covid/flu/rsv neg.  Cxr neg for pna.  Pt's lungs sound much better after nebs and she feels better. Homeless/no pcp:  toc consulted to help with meds/pcp   Reevaluation:  After the interventions noted above, I reevaluated the patient and found that they have :improved   Social  Determinants of Health:  No pcp/homeless   Dispostion:  After consideration of the diagnostic results and the patients response to treatment, I feel that the patent would benefit from discharge with outpatient f/u.          Final Clinical Impression(s) / ED Diagnoses Final diagnoses:  Hypertension, unspecified type  Tobacco abuse    Rx / DC Orders ED Discharge Orders     None         Jacalyn Lefevre, MD 08/21/23 1127

## 2023-08-21 NOTE — Discharge Instructions (Addendum)
 Try to stop smoking.  Try to eat a diet higher in healthy foods.

## 2023-08-22 ENCOUNTER — Encounter (HOSPITAL_COMMUNITY): Payer: Self-pay | Admitting: Internal Medicine

## 2023-08-22 ENCOUNTER — Emergency Department (HOSPITAL_COMMUNITY): Payer: MEDICAID

## 2023-08-22 ENCOUNTER — Observation Stay (HOSPITAL_COMMUNITY): Payer: MEDICAID

## 2023-08-22 DIAGNOSIS — R0902 Hypoxemia: Secondary | ICD-10-CM | POA: Diagnosis present

## 2023-08-22 DIAGNOSIS — I509 Heart failure, unspecified: Secondary | ICD-10-CM | POA: Diagnosis not present

## 2023-08-22 DIAGNOSIS — Z7984 Long term (current) use of oral hypoglycemic drugs: Secondary | ICD-10-CM | POA: Diagnosis not present

## 2023-08-22 DIAGNOSIS — T380X5A Adverse effect of glucocorticoids and synthetic analogues, initial encounter: Secondary | ICD-10-CM | POA: Diagnosis present

## 2023-08-22 DIAGNOSIS — I10 Essential (primary) hypertension: Secondary | ICD-10-CM | POA: Diagnosis not present

## 2023-08-22 DIAGNOSIS — J4521 Mild intermittent asthma with (acute) exacerbation: Secondary | ICD-10-CM | POA: Diagnosis not present

## 2023-08-22 DIAGNOSIS — R600 Localized edema: Secondary | ICD-10-CM

## 2023-08-22 DIAGNOSIS — F325 Major depressive disorder, single episode, in full remission: Secondary | ICD-10-CM | POA: Diagnosis present

## 2023-08-22 DIAGNOSIS — J45901 Unspecified asthma with (acute) exacerbation: Secondary | ICD-10-CM

## 2023-08-22 DIAGNOSIS — F17210 Nicotine dependence, cigarettes, uncomplicated: Secondary | ICD-10-CM | POA: Diagnosis present

## 2023-08-22 DIAGNOSIS — E876 Hypokalemia: Secondary | ICD-10-CM

## 2023-08-22 DIAGNOSIS — Z5900 Homelessness unspecified: Secondary | ICD-10-CM | POA: Diagnosis not present

## 2023-08-22 DIAGNOSIS — F332 Major depressive disorder, recurrent severe without psychotic features: Secondary | ICD-10-CM

## 2023-08-22 DIAGNOSIS — I5021 Acute systolic (congestive) heart failure: Secondary | ICD-10-CM | POA: Diagnosis not present

## 2023-08-22 DIAGNOSIS — R7989 Other specified abnormal findings of blood chemistry: Secondary | ICD-10-CM | POA: Diagnosis not present

## 2023-08-22 DIAGNOSIS — I5033 Acute on chronic diastolic (congestive) heart failure: Secondary | ICD-10-CM | POA: Diagnosis present

## 2023-08-22 DIAGNOSIS — I5A Non-ischemic myocardial injury (non-traumatic): Secondary | ICD-10-CM | POA: Diagnosis present

## 2023-08-22 DIAGNOSIS — F1490 Cocaine use, unspecified, uncomplicated: Secondary | ICD-10-CM | POA: Diagnosis present

## 2023-08-22 DIAGNOSIS — R0602 Shortness of breath: Secondary | ICD-10-CM

## 2023-08-22 DIAGNOSIS — Z79899 Other long term (current) drug therapy: Secondary | ICD-10-CM | POA: Diagnosis not present

## 2023-08-22 DIAGNOSIS — Z1152 Encounter for screening for COVID-19: Secondary | ICD-10-CM | POA: Diagnosis not present

## 2023-08-22 DIAGNOSIS — E119 Type 2 diabetes mellitus without complications: Secondary | ICD-10-CM | POA: Diagnosis not present

## 2023-08-22 DIAGNOSIS — Z7952 Long term (current) use of systemic steroids: Secondary | ICD-10-CM | POA: Diagnosis not present

## 2023-08-22 DIAGNOSIS — J4531 Mild persistent asthma with (acute) exacerbation: Secondary | ICD-10-CM | POA: Diagnosis not present

## 2023-08-22 DIAGNOSIS — J4489 Other specified chronic obstructive pulmonary disease: Secondary | ICD-10-CM | POA: Diagnosis present

## 2023-08-22 DIAGNOSIS — I2489 Other forms of acute ischemic heart disease: Secondary | ICD-10-CM | POA: Diagnosis present

## 2023-08-22 DIAGNOSIS — Z7951 Long term (current) use of inhaled steroids: Secondary | ICD-10-CM | POA: Diagnosis not present

## 2023-08-22 DIAGNOSIS — E1165 Type 2 diabetes mellitus with hyperglycemia: Secondary | ICD-10-CM | POA: Diagnosis not present

## 2023-08-22 DIAGNOSIS — I110 Hypertensive heart disease with heart failure: Secondary | ICD-10-CM | POA: Diagnosis present

## 2023-08-22 LAB — COMPREHENSIVE METABOLIC PANEL
ALT: 25 U/L (ref 0–44)
ALT: 23 U/L (ref 0–44)
AST: 22 U/L (ref 15–41)
AST: 24 U/L (ref 15–41)
Albumin: 3.7 g/dL (ref 3.5–5.0)
Albumin: 3.7 g/dL (ref 3.5–5.0)
Alkaline Phosphatase: 52 U/L (ref 38–126)
Alkaline Phosphatase: 51 U/L (ref 38–126)
Anion gap: 14 (ref 5–15)
Anion gap: 14 (ref 5–15)
BUN: 6 mg/dL (ref 6–20)
BUN: 6 mg/dL (ref 6–20)
CO2: 21 mmol/L — ABNORMAL LOW (ref 22–32)
CO2: 21 mmol/L — ABNORMAL LOW (ref 22–32)
Calcium: 10.5 mg/dL — ABNORMAL HIGH (ref 8.9–10.3)
Calcium: 10.2 mg/dL (ref 8.9–10.3)
Chloride: 107 mmol/L (ref 98–111)
Chloride: 106 mmol/L (ref 98–111)
Creatinine, Ser: 0.74 mg/dL (ref 0.44–1.00)
Creatinine, Ser: 0.79 mg/dL (ref 0.44–1.00)
GFR, Estimated: >60 mL/min (ref >60)
GFR, Estimated: >60 mL/min (ref >60)
Glucose, Bld: 203 mg/dL — ABNORMAL HIGH (ref 70–99)
Glucose, Bld: 275 mg/dL — ABNORMAL HIGH (ref 70–99)
Potassium: 3.4 mmol/L — ABNORMAL LOW (ref 3.5–5.1)
Potassium: 3.6 mmol/L (ref 3.5–5.1)
Sodium: 142 mmol/L (ref 135–145)
Sodium: 141 mmol/L (ref 135–145)
Total Bilirubin: 0.7 mg/dL (ref 0.0–1.2)
Total Bilirubin: 0.8 mg/dL (ref 0.0–1.2)
Total Protein: 7.2 g/dL (ref 6.5–8.1)
Total Protein: 7.2 g/dL (ref 6.5–8.1)

## 2023-08-22 LAB — HEMOGLOBIN A1C
Hgb A1c MFr Bld: 8.5 % — ABNORMAL HIGH (ref 4.8–5.6)
Mean Plasma Glucose: 197.25 mg/dL

## 2023-08-22 LAB — CBC WITH DIFFERENTIAL/PLATELET
Abs Immature Granulocytes: 0.03 10*3/uL (ref 0.00–0.07)
Basophils Absolute: 0 10*3/uL (ref 0.0–0.1)
Basophils Relative: 0 %
Eosinophils Absolute: 0 10*3/uL (ref 0.0–0.5)
Eosinophils Relative: 0 %
HCT: 41.4 % (ref 36.0–46.0)
Hemoglobin: 13.1 g/dL (ref 12.0–15.0)
Immature Granulocytes: 0 %
Lymphocytes Relative: 7 %
Lymphs Abs: 0.8 10*3/uL (ref 0.7–4.0)
MCH: 26.8 pg (ref 26.0–34.0)
MCHC: 31.6 g/dL (ref 30.0–36.0)
MCV: 84.8 fL (ref 80.0–100.0)
Monocytes Absolute: 0.5 10*3/uL (ref 0.1–1.0)
Monocytes Relative: 5 %
Neutro Abs: 9.5 10*3/uL — ABNORMAL HIGH (ref 1.7–7.7)
Neutrophils Relative %: 88 %
Platelets: 200 10*3/uL (ref 150–400)
RBC: 4.88 MIL/uL (ref 3.87–5.11)
RDW: 14.5 % (ref 11.5–15.5)
WBC: 10.8 10*3/uL — ABNORMAL HIGH (ref 4.0–10.5)
nRBC: 0 % (ref 0.0–0.2)

## 2023-08-22 LAB — ECHOCARDIOGRAM COMPLETE
Calc EF: 55.1 %
Height: 70 in
S' Lateral: 3.5 cm
Single Plane A2C EF: 58.2 %
Single Plane A4C EF: 54.1 %
Weight: 4240 [oz_av]

## 2023-08-22 LAB — CBC
HCT: 41.4 % (ref 36.0–46.0)
Hemoglobin: 13.1 g/dL (ref 12.0–15.0)
MCH: 26.8 pg (ref 26.0–34.0)
MCHC: 31.6 g/dL (ref 30.0–36.0)
MCV: 84.7 fL (ref 80.0–100.0)
Platelets: 189 10*3/uL (ref 150–400)
RBC: 4.89 MIL/uL (ref 3.87–5.11)
RDW: 14.6 % (ref 11.5–15.5)
WBC: 10.7 10*3/uL — ABNORMAL HIGH (ref 4.0–10.5)
nRBC: 0 % (ref 0.0–0.2)

## 2023-08-22 LAB — GLUCOSE, CAPILLARY
Glucose-Capillary: 226 mg/dL — ABNORMAL HIGH (ref 70–99)
Glucose-Capillary: 220 mg/dL — ABNORMAL HIGH (ref 70–99)
Glucose-Capillary: 189 mg/dL — ABNORMAL HIGH (ref 70–99)
Glucose-Capillary: 268 mg/dL — ABNORMAL HIGH (ref 70–99)

## 2023-08-22 LAB — CBG MONITORING, ED: Glucose-Capillary: 382 mg/dL — ABNORMAL HIGH (ref 70–99)

## 2023-08-22 LAB — RESP PANEL BY RT-PCR (RSV, FLU A&B, COVID)  RVPGX2
Influenza A by PCR: NEGATIVE
Influenza B by PCR: NEGATIVE
Resp Syncytial Virus by PCR: NEGATIVE
SARS Coronavirus 2 by RT PCR: NEGATIVE

## 2023-08-22 LAB — TROPONIN I (HIGH SENSITIVITY)
Troponin I (High Sensitivity): 28 ng/L — ABNORMAL HIGH (ref <18)
Troponin I (High Sensitivity): 29 ng/L — ABNORMAL HIGH (ref <18)

## 2023-08-22 LAB — BRAIN NATRIURETIC PEPTIDE: B Natriuretic Peptide: 402.1 pg/mL — ABNORMAL HIGH (ref 0.0–100.0)

## 2023-08-22 MED ORDER — ALBUTEROL SULFATE (2.5 MG/3ML) 0.083% IN NEBU
2.5000 mg | INHALATION_SOLUTION | RESPIRATORY_TRACT | Status: DC | PRN
  Administered 2023-08-22 – 2023-08-23 (×4): 2.5 mg via RESPIRATORY_TRACT
  Filled 2023-08-22 (×4): qty 3

## 2023-08-22 MED ORDER — NICOTINE 21 MG/24HR TD PT24
21.0000 mg | MEDICATED_PATCH | Freq: Every day | TRANSDERMAL | Status: DC
  Administered 2023-08-22 – 2023-08-25 (×4): 21 mg via TRANSDERMAL
  Filled 2023-08-22 (×4): qty 1

## 2023-08-22 MED ORDER — ACETAMINOPHEN 325 MG PO TABS
650.0000 mg | ORAL_TABLET | Freq: Four times a day (QID) | ORAL | Status: DC | PRN
  Administered 2023-08-22: 650 mg via ORAL
  Filled 2023-08-22: qty 2

## 2023-08-22 MED ORDER — ONDANSETRON HCL 4 MG PO TABS
4.0000 mg | ORAL_TABLET | Freq: Four times a day (QID) | ORAL | Status: DC | PRN
Start: 2023-08-22 — End: 2023-08-25
  Administered 2023-08-24: 4 mg via ORAL
  Filled 2023-08-22: qty 1

## 2023-08-22 MED ORDER — ORAL CARE MOUTH RINSE: 15.0000 mL | OROMUCOSAL | Status: DC | PRN

## 2023-08-22 MED ORDER — ACETAMINOPHEN 650 MG RE SUPP: 650.0000 mg | Freq: Four times a day (QID) | RECTAL | Status: DC | PRN

## 2023-08-22 MED ORDER — ENOXAPARIN SODIUM 60 MG/0.6ML IJ SOSY
60.0000 mg | PREFILLED_SYRINGE | INTRAMUSCULAR | Status: DC
  Administered 2023-08-22 – 2023-08-23 (×2): 60 mg via SUBCUTANEOUS
  Filled 2023-08-22 (×2): qty 0.6

## 2023-08-22 MED ORDER — FUROSEMIDE 10 MG/ML IJ SOLN
20.0000 mg | Freq: Two times a day (BID) | INTRAMUSCULAR | Status: AC
  Administered 2023-08-22 (×2): 20 mg via INTRAVENOUS
  Filled 2023-08-22: qty 2
  Filled 2023-08-22: qty 4

## 2023-08-22 MED ORDER — FUROSEMIDE 40 MG PO TABS
40.0000 mg | ORAL_TABLET | Freq: Every day | ORAL | Status: DC
  Administered 2023-08-23 – 2023-08-25 (×3): 40 mg via ORAL
  Filled 2023-08-22 (×3): qty 1

## 2023-08-22 MED ORDER — SODIUM CHLORIDE 0.9 % IV SOLN
500.0000 mg | INTRAVENOUS | Status: DC
  Administered 2023-08-22: 500 mg via INTRAVENOUS
  Filled 2023-08-22: qty 5

## 2023-08-22 MED ORDER — SODIUM CHLORIDE 0.9% FLUSH
3.0000 mL | Freq: Two times a day (BID) | INTRAVENOUS | Status: DC
  Administered 2023-08-22 – 2023-08-24 (×5): 3 mL via INTRAVENOUS

## 2023-08-22 MED ORDER — NICOTINE 14 MG/24HR TD PT24
14.0000 mg | MEDICATED_PATCH | Freq: Every day | TRANSDERMAL | Status: DC
  Administered 2023-08-22: 14 mg via TRANSDERMAL
  Filled 2023-08-22 (×2): qty 1

## 2023-08-22 MED ORDER — POTASSIUM CHLORIDE 20 MEQ PO PACK
40.0000 meq | PACK | Freq: Once | ORAL | Status: AC
  Administered 2023-08-22: 40 meq via ORAL
  Filled 2023-08-22: qty 2

## 2023-08-22 MED ORDER — IOHEXOL 350 MG/ML SOLN
75.0000 mL | Freq: Once | INTRAVENOUS | Status: AC | PRN
  Administered 2023-08-22: 75 mL via INTRAVENOUS

## 2023-08-22 MED ORDER — IPRATROPIUM BROMIDE 0.02 % IN SOLN
0.5000 mg | Freq: Four times a day (QID) | RESPIRATORY_TRACT | Status: DC
Start: 2023-08-22 — End: 2023-08-22
  Administered 2023-08-22 (×4): 0.5 mg via RESPIRATORY_TRACT
  Filled 2023-08-22 (×4): qty 2.5

## 2023-08-22 MED ORDER — INSULIN ASPART 100 UNIT/ML IJ SOLN
0.0000 [IU] | Freq: Three times a day (TID) | INTRAMUSCULAR | Status: DC
  Administered 2023-08-22: 2 [IU] via SUBCUTANEOUS
  Administered 2023-08-22: 3 [IU] via SUBCUTANEOUS
  Administered 2023-08-22: 9 [IU] via SUBCUTANEOUS
  Administered 2023-08-23: 5 [IU] via SUBCUTANEOUS
  Administered 2023-08-23 (×2): 3 [IU] via SUBCUTANEOUS
  Administered 2023-08-24: 5 [IU] via SUBCUTANEOUS
  Administered 2023-08-24: 3 [IU] via SUBCUTANEOUS
  Administered 2023-08-24: 7 [IU] via SUBCUTANEOUS
  Administered 2023-08-25: 3 [IU] via SUBCUTANEOUS
  Administered 2023-08-25: 5 [IU] via SUBCUTANEOUS

## 2023-08-22 MED ORDER — AZITHROMYCIN 500 MG PO TABS
500.0000 mg | ORAL_TABLET | Freq: Every day | ORAL | Status: AC
  Administered 2023-08-23 – 2023-08-24 (×2): 500 mg via ORAL
  Filled 2023-08-22 (×2): qty 1

## 2023-08-22 MED ORDER — ONDANSETRON HCL 4 MG/2ML IJ SOLN: 4.0000 mg | Freq: Four times a day (QID) | INTRAMUSCULAR | Status: DC | PRN

## 2023-08-22 MED ORDER — AMLODIPINE BESYLATE 5 MG PO TABS: 5.0000 mg | ORAL_TABLET | Freq: Every day | ORAL | Status: DC

## 2023-08-22 MED ORDER — LEVALBUTEROL HCL 0.63 MG/3ML IN NEBU
0.6300 mg | INHALATION_SOLUTION | Freq: Four times a day (QID) | RESPIRATORY_TRACT | Status: DC
Start: 2023-08-22 — End: 2023-08-22
  Administered 2023-08-22 (×4): 0.63 mg via RESPIRATORY_TRACT
  Filled 2023-08-22 (×6): qty 3

## 2023-08-22 MED ORDER — SODIUM CHLORIDE 0.9 % IV SOLN: 250.0000 mL | INTRAVENOUS | Status: AC | PRN

## 2023-08-22 MED ORDER — BUTALBITAL-APAP-CAFFEINE 50-325-40 MG PO TABS
1.0000 | ORAL_TABLET | Freq: Four times a day (QID) | ORAL | Status: AC | PRN
  Administered 2023-08-22 (×2): 1 via ORAL
  Filled 2023-08-22 (×2): qty 1

## 2023-08-22 MED ORDER — GUAIFENESIN ER 600 MG PO TB12
600.0000 mg | ORAL_TABLET | Freq: Two times a day (BID) | ORAL | Status: DC
  Administered 2023-08-22 – 2023-08-25 (×7): 600 mg via ORAL
  Filled 2023-08-22 (×7): qty 1

## 2023-08-22 MED ORDER — SODIUM CHLORIDE 0.9% FLUSH: 3.0000 mL | INTRAVENOUS | Status: DC | PRN

## 2023-08-22 MED ORDER — AMLODIPINE BESYLATE 5 MG PO TABS
5.0000 mg | ORAL_TABLET | Freq: Every day | ORAL | Status: DC
  Administered 2023-08-22 – 2023-08-25 (×4): 5 mg via ORAL
  Filled 2023-08-22 (×5): qty 1

## 2023-08-22 MED ORDER — MAGNESIUM SULFATE 2 GM/50ML IV SOLN
2.0000 g | Freq: Once | INTRAVENOUS | Status: AC
  Administered 2023-08-22: 2 g via INTRAVENOUS
  Filled 2023-08-22: qty 50

## 2023-08-22 MED ORDER — SODIUM CHLORIDE 0.9% FLUSH: 3.0000 mL | Freq: Two times a day (BID) | INTRAVENOUS | Status: DC

## 2023-08-22 MED ORDER — PANTOPRAZOLE SODIUM 40 MG PO TBEC
40.0000 mg | DELAYED_RELEASE_TABLET | Freq: Every day | ORAL | Status: DC
  Administered 2023-08-22 – 2023-08-25 (×4): 40 mg via ORAL
  Filled 2023-08-22 (×5): qty 1

## 2023-08-22 MED ORDER — METHYLPREDNISOLONE SODIUM SUCC 40 MG IJ SOLR
40.0000 mg | Freq: Two times a day (BID) | INTRAMUSCULAR | Status: DC
Start: 2023-08-22 — End: 2023-08-26
  Administered 2023-08-22 – 2023-08-23 (×3): 40 mg via INTRAVENOUS
  Filled 2023-08-22 (×3): qty 1

## 2023-08-22 NOTE — ED Notes (Signed)
 Patient transported to X-ray

## 2023-08-22 NOTE — H&P (Addendum)
 History and Physical    Julie [name] UEA:[id number] DOB: [birth year] DOA: 08/21/2023  PCP: Pcp, No   Patient coming from: Home   Chief Complaint:  Chief Complaint  Patient presents with   SOB / Wheezing   ED TRIAGE note:  Patient reports persistent SOB with wheezing , chest congestion and productive cough onset this week . Seen and discharge here yesterday for the same complaints , did not fill her RX . She received 2 doses of Duoneb and Solumedrol 125 mg IV prior to arrival .       HPI:  Julie [name] is a 48 y.o. female with medical history significant of asthma, major depressive disorder and cocaine use disorder presented to emergency department complaining of cough and shortness of breath which has been ongoing for 1 week.  Patient symptom has been progressively getting worse.  Denies any fever and chill.  She is also endorsing bilateral lower extremity swelling.   Patient was seen in the ED 3/2 in the morning for asthma exacerbation she was discharged to home with oral prednisone and DuoNeb however patient did not receive the refill of the medication and came to ED with worsening symptoms. Reported diagnosed with asthma many years ago in the emergency department.  No formal diagnosis of asthma.  History of smoking cigarettes for more than 30 years.  Currently smokes 5 to 10 cigarettes in a day.  Patient is complaining of shortness of breath, productive cough, wheezing and headache.  Denies any fever and chill.  Denies any chest pain, and chest pressure.  Complaining about bilateral lower extremity chronic edema.Patient denies any prolonged immobilization, recent travel and hormonal medications for birth control.    ED Course:  Presentation to ED patient is tachycardic tachypneic borderline hypertensive and O2 sat 94% on 4 L. Respiratory panel negative for COVID, RSV flu. Troponin 21 and 28. Elevated BNP 402. EKG showing sinus tachycardia, ventricular premature complex heart  rate 128. CMP showing low potassium 3.4, low chloride 31, elevated blood glucose 203 and slightly elevated calcium 10.5 otherwise unremarkable. CBC showing leukocytosis 10.8 otherwise unremarkable.  Chest x-ray no acute disease process. CTA chest negative for pulmonary embolism.  Mild diffuse bronchial wall thickening representing bronchitis/reactive airway disease.  Hospitalist has been consulted for further evaluation management of asthma exacerbation and concern for new development of CHF exacerbation.  Significant labs in the ED: Lab Orders         Resp panel by RT-PCR (RSV, Flu A&B, Covid) Anterior Nasal Swab         CBC with Differential         Comprehensive metabolic panel         Brain natriuretic peptide         Comprehensive metabolic panel         CBC       Review of Systems:  Review of Systems  Constitutional:  Negative for chills, fever and malaise/fatigue.  HENT:         Headache  Respiratory:  Positive for cough, sputum production and shortness of breath.   Cardiovascular:  Negative for chest pain, palpitations, orthopnea and leg swelling.  Gastrointestinal:  Negative for abdominal pain, heartburn, nausea and vomiting.  Musculoskeletal:  Negative for back pain, myalgias and neck pain.  Neurological:  Positive for headaches. Negative for dizziness.  Psychiatric/Behavioral:  The patient is not nervous/anxious.     Past Medical History:  Diagnosis Date   Asthma    Diabetes mellitus without  complication (HCC)    Fracture, humerus closed, shaft 07/11/2014   left   History of bronchitis    Mental health problem    Open fracture of great toe of left foot 12/25/2015    Past Surgical History:  Procedure Laterality Date   CESAREAN SECTION     ECTOPIC PREGNANCY SURGERY  2003   INCISION AND DRAINAGE Left 12/25/2015   Procedure: INCISION AND DRAINAGE With  Amputation of distal tip of Left Great and Second Toe.;  Surgeon: Tarry Kos, MD;  Location: [facility];  Service:  Orthopedics;  Laterality: Left;   LIPOMA EXCISION     chest   ORIF HUMERUS FRACTURE Left 07/15/2014   Procedure: OPEN REDUCTION INTERNAL FIXATION (ORIF) LEFT HUMERAL SHAFT ;  Surgeon: Sheral Apley, MD;  Location: [facility];  Service: Orthopedics;  Laterality: Left;   TUBAL LIGATION  2003     reports that she has been smoking cigarettes. She has a 9 pack-year smoking history. She has never used smokeless tobacco. She reports current alcohol use. She reports current drug use. Drugs: Marijuana, "Crack" cocaine, and MDMA (Ecstacy).  No Known Allergies  History reviewed. No pertinent family history.  Prior to Admission medications   Medication Sig Start Date End Date Taking? Authorizing Provider  amLODipine (NORVASC) 5 MG tablet Take 1 tablet (5 mg total) by mouth daily. 08/21/23   Jacalyn Lefevre, MD  amoxicillin-clavulanate (AUGMENTIN) 875-125 MG tablet Take 1 tablet by mouth every 12 (twelve) hours. Patient not taking: Reported on 06/28/2023 06/28/23   Mannie Stabile, PA-C  ibuprofen (ADVIL) 200 MG tablet Take 800 mg by mouth every 6 (six) hours as needed for fever, moderate pain (pain score 4-6) or headache.    [provider]  naproxen (NAPROSYN) 500 MG tablet Take 1 tablet (500 mg total) by mouth 2 (two) times daily. Patient not taking: Reported on 06/28/2023 06/19/23   Renne Crigler, PA-C  predniSONE (DELTASONE) 50 MG tablet Take 1 tablet (50 mg total) by mouth daily with breakfast. 08/21/23   Jacalyn Lefevre, MD     Physical Exam: Vitals:   08/22/23 0121  BP: (!) 158/69  Pulse: (!) 116  Resp: (!) 26  SpO2: 94%    Physical Exam Vitals and nursing note reviewed.  Constitutional:      Appearance: She is ill-appearing.  HENT:     Head: Atraumatic.     Mouth/Throat:     Mouth: Mucous membranes are moist.  Eyes:     Pupils: Pupils are equal, round, and reactive to light.  Cardiovascular:     Rate and Rhythm: Regular rhythm. Tachycardia present.      Pulses: Normal pulses.     Heart sounds: Normal heart sounds. No murmur heard. Pulmonary:     Effort: Pulmonary effort is normal.     Breath sounds: Wheezing present. No rhonchi or rales.  Musculoskeletal:        General: No swelling.     Cervical back: Neck supple.     Right lower leg: No edema.     Left lower leg: No edema.  Skin:    General: Skin is dry.     Capillary Refill: Capillary refill takes less than 2 seconds.  Neurological:     Mental Status: She is alert and oriented to person, place, and time.  Psychiatric:        Mood and Affect: Mood normal.        Thought Content: Thought content normal.  Judgment: Judgment normal.      Labs on Admission: I have personally reviewed following labs and imaging studies  CBC: Recent Labs  Lab 08/21/23 1032 08/22/23 0105  WBC 9.3 10.8*  NEUTROABS 7.5 9.5*  HGB 13.7 13.1  HCT 43.2 41.4  MCV 85.4 84.8  PLT 197 200   Basic Metabolic Panel: Recent Labs  Lab 08/21/23 0858 08/22/23 0105  NA 143 142  K 4.5 3.4*  CL 111 107  CO2 18* 21*  GLUCOSE 194* 203*  BUN 7 6  CREATININE 0.61 0.74  CALCIUM 9.9 10.5*   GFR: Estimated Creatinine Clearance: 121.1 mL/min (by C-G formula based on SCr of 0.74 mg/dL). Liver Function Tests: Recent Labs  Lab 08/22/23 0105  AST 22  ALT 25  ALKPHOS 52  BILITOT 0.7  PROT 7.2  ALBUMIN 3.7   No results for input(s): "LIPASE", "AMYLASE" in the last 168 hours. No results for input(s): "AMMONIA" in the last 168 hours. Coagulation Profile: No results for input(s): "INR", "PROTIME" in the last 168 hours. Cardiac Enzymes: Recent Labs  Lab 08/21/23 0858 08/22/23 0105  TROPONINIHS 21* 28*   BNP (last 3 results) Recent Labs    08/22/23 0105  BNP 402.1*   HbA1C: No results for input(s): "HGBA1C" in the last 72 hours. CBG: No results for input(s): "GLUCAP" in the last 168 hours. Lipid Profile: No results for input(s): "CHOL", "HDL", "LDLCALC", "TRIG", "CHOLHDL", "LDLDIRECT"  in the last 72 hours. Thyroid Function Tests: No results for input(s): "TSH", "T4TOTAL", "FREET4", "T3FREE", "THYROIDAB" in the last 72 hours. Anemia Panel: No results for input(s): "VITAMINB12", "FOLATE", "FERRITIN", "TIBC", "IRON", "RETICCTPCT" in the last 72 hours. Urine analysis:    Component Value Date/Time   COLORURINE YELLOW 03/17/2023 1746   APPEARANCEUR CLOUDY (A) 03/17/2023 1746   LABSPEC 1.013 03/17/2023 1746   PHURINE 7.0 03/17/2023 1746   GLUCOSEU NEGATIVE 03/17/2023 1746   HGBUR MODERATE (A) 03/17/2023 1746   BILIRUBINUR NEGATIVE 03/17/2023 1746   KETONESUR NEGATIVE 03/17/2023 1746   PROTEINUR NEGATIVE 03/17/2023 1746   UROBILINOGEN 0.2 03/21/2019 0936   NITRITE NEGATIVE 03/17/2023 1746   LEUKOCYTESUR LARGE (A) 03/17/2023 1746    Radiological Exams on Admission: I have personally reviewed images CT Angio Chest PE W and/or Wo Contrast Result Date: 08/22/2023 CLINICAL DATA:  Pulmonary embolism suspected. Shortness of breath and wheezing. EXAM: CT ANGIOGRAPHY CHEST WITH CONTRAST TECHNIQUE: Multidetector CT imaging of the chest was performed using the standard protocol during bolus administration of intravenous contrast. Multiplanar CT image reconstructions and MIPs were obtained to evaluate the vascular anatomy. RADIATION DOSE REDUCTION: This exam was performed according to the departmental dose-optimization program which includes automated exposure control, adjustment of the mA and/or kV according to patient size and/or use of iterative reconstruction technique. CONTRAST:  75mL OMNIPAQUE IOHEXOL 350 MG/ML SOLN COMPARISON:  Same day chest radiograph FINDINGS: Cardiovascular: Borderline cardiomegaly. No pericardial effusion. Normal caliber thoracic aorta. Negative for acute pulmonary embolism. Mediastinum/Nodes: Unremarkable trachea and esophagus. Shotty mediastinal and hilar lymph nodes are favored reactive. Lungs/Pleura: No focal consolidation, pleural effusion, or pneumothorax.  Mild diffuse bronchial wall thickening. Upper Abdomen: No acute abnormality. Musculoskeletal: 2.0 cm cystic lesion in the medial left upper chest favored to represent a sebaceous cyst. No acute fracture. Review of the MIP images confirms the above findings. IMPRESSION: 1. Negative for acute pulmonary embolism. 2. Mild diffuse bronchial wall thickening can be seen with bronchitis/reactive airways. Electronically Signed   By: Minerva Fester M.D.   On: 08/22/2023 03:17  DG Chest 2 View Result Date: 08/22/2023 CLINICAL DATA:  Shortness of breath and cough. EXAM: CHEST - 2 VIEW COMPARISON:  August 21, 2023 FINDINGS: The heart size and mediastinal contours are within normal limits. Both lungs are clear. The visualized skeletal structures are unremarkable. IMPRESSION: No active cardiopulmonary disease. Electronically Signed   By: Aram Candela M.D.   On: 08/22/2023 01:18   DG Chest Portable 1 View Result Date: 08/21/2023 CLINICAL DATA:  Shortness of breath EXAM: PORTABLE CHEST 1 VIEW COMPARISON:  02/27/2023 FINDINGS: The heart size and mediastinal contours are within normal limits. Both lungs are clear. The visualized skeletal structures are unremarkable. IMPRESSION: No active disease. Electronically Signed   By: Signa Kell M.D.   On: 08/21/2023 09:04     EKG: My personal interpretation of EKG shows: Sinus tachycardia heart rate 128, ventricular premature complex.    Assessment/Plan: Principal Problem:   Asthma exacerbation Active Problems:   Asthma, chronic, unspecified asthma severity, with acute exacerbation   MDD (major depressive disorder), recurrent severe, without psychosis (HCC)   Lower extremity edema   Elevated brain natriuretic peptide (BNP) level   Concern for new onset of CHF with exacerbation (congestive heart failure) (HCC)   Hypokalemia   History of essential hypertension   Elevated troponin   Continuous dependence on cigarette smoking    Assessment and Plan: Asthma  exacerbation -Patient presented emergency department with complaining of worsening shortness of breath and cough.  Denies any fever and chills.  Patient was in the ED earlier this morning when she was treated with IV Solu-Medrol 125 mg and DuoNeb nebulizer and she was discharged home with oral prednisone and DuoNeb inhaler prescription however patient unable to fill dose medication and came to ED with worsening symptom. -Patient reported  has history of asthma, she has diagnosed asthma in the emergency department many years ago but she does not have any formal lung function test.  Also she is a chronic smoker for last 30 years usually smokes 5 to 10 cigarettes in a day. 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 - At presentation to ED patient is hemodynamically stable O2 sat 94 to 96% to liter oxygen. -Normal respiratory panel.  CBC showing a leukocytosis.  Chest x-ray no active disease process.  CTA chest negative for embolism.  Evidence of bronchitis/reactive airway. - Starting IV prednisone 40 mg twice daily and IV azithromycin 500 mg daily.  Giving mag sulfate 2 g.  Continue Xopenex every 6 hours, ipratropium every 6 hours and albuterol as needed for wheezing shortness of breath.  Continue Mucinex.  Supportive care, continue check pulse ox and supplemental oxygen as needed to keep O2 sat of 92%. -Continue cardiac monitoring.  Elevated troponin-secondary demand ischemia -Initial troponin 21 and second troponin 28.  Patient denies any chest pain and pressure.  EKG showing sinus tachycardia heart rate 128.  There is no ST-T wave abnormality.  Concern for elevated troponin due to demand ischemia in the context of elevated blood pressure and new onset of CHF exacerbation. - Checking another troponin.  If it remains flat will stop further chasing of troponin. - Obtaining echocardiogram. - Continue cardiac monitoring.  Essential hypertension Elevated  BNP and bilateral lower extremity swelling-concern for new onset of CHF -History of essential hypertension.  Patient is complaining of bilateral lower extremity pitting edema. - Continue amlodipine 5 mg daily - Found to have elevated BNP around 402. -Concern for development of new onset of  CHF with exacerbation. - Obtaining echocardiogram - Starting IV Lasix 20 mg twice daily.  Strict I's/O, daily weight, monitor urine output. -If echocardiogram shows diastolic dysfunction will change amlodipine to spironolactone.  Need to achieve optimal volume status before transition to oral Lasix. -Continue cardiac monitoring  Hypokalemia -Low potassium 3.4.  Replating with oral KCl 40 mEq.  History major depressive disorder - Patient reported she has been not taking any depressive medication given her depression has been completely in remission.    Chronic smoking of smoking cigarette - Counseled patient at the bedside for smoking cessation. - Order nicotine patch 40 mg daily.   DVT prophylaxis:  Lovenox Code Status:  Full Code Diet: Heart healthy diet, fluid restriction 2 L/day, salt restriction 2 g/day Family Communication:   Family was present at bedside, at the time of interview. Opportunity was given to ask question and all questions were answered satisfactorily.  Disposition Plan: Continue monitor improvement of shortness of breath and wheezing.  Ending echocardiogram. Consults: None at this time. Admission status:   Observation, Telemetry bed  Severity of Illness: The appropriate patient status for this patient is OBSERVATION. Observation status is judged to be reasonable and necessary in order to provide the required intensity of service to ensure the patient's safety. The patient's presenting symptoms, physical exam findings, and initial radiographic and laboratory data in the context of their medical condition is felt to place them at decreased risk for further clinical deterioration.  Furthermore, it is anticipated that the patient will be medically stable for discharge from the hospital within 2 midnights of admission.     Tereasa Coop, MD [facility]  How to contact the [facility] Attending or Consulting provider 7A - 7P or covering provider during after hours 7P -7A, for this patient.  Check the care team in [facility] and look for a) attending/consulting [facility] provider listed and b) the [facility] team listed Log into [email or web address] and use [place] universal password to access. If you do not have the password, please contact the hospital operator. Locate the [facility] provider you are looking for under [facility] and page to a number that you can be directly reached. If you still have difficulty reaching the provider, please page the [facility] (Director on Call) for the Hospitalists listed on amion for assistance.  08/22/2023, 4:29 AM

## 2023-08-22 NOTE — ED Triage Notes (Signed)
 Patient reports persistent SOB with wheezing , chest congestion and productive cough onset this week . Seen and discharge here yesterday for the same complaints , did not fill her RX . She received 2 doses of Duoneb and Solumedrol 125 mg IV prior to arrival .

## 2023-08-22 NOTE — ED Notes (Signed)
 Patient given apple juice

## 2023-08-22 NOTE — ED Notes (Signed)
 Patient refused to be placed back in the monitor, states " no more cords". O2 sat probe / BP cuff applied .

## 2023-08-22 NOTE — Plan of Care (Signed)

## 2023-08-22 NOTE — Inpatient Diabetes Management (Signed)
 Inpatient Diabetes Program Recommendations  AACE/ADA: New Consensus Statement on Inpatient Glycemic Control (2015)  Target Ranges:  Prepandial:   less than 140 mg/dL      Peak postprandial:   less than 180 mg/dL (1-2 hours)      Critically ill patients:  140 - 180 mg/dL   Lab Results  Component Value Date   GLUCAP 220 (H) 08/22/2023   HGBA1C 8.5 (H) 08/22/2023    Review of Glycemic Control  Latest Reference Range & Units 08/22/23 08:10 08/22/23 10:57 08/22/23 12:14  Glucose-Capillary 70 - 99 mg/dL 161 (H) 096 (H) 045 (H)   Diabetes history: DM  Outpatient Diabetes medications:  None Current orders for Inpatient glycemic control:  Novolog 0-9 units tid with meals  Solumedrol 40 mg IV q 12 hours Inpatient Diabetes Program Recommendations:    Note blood sugars elevated (especially with steroids).   Consider adding Semglee 20 units daily and Novolog 3 units tid with meals (hold if patient eats less than 50% or NPO) while on steroids.   Thanks,  Lorenza Cambridge, RN, BC-ADM Inpatient Diabetes Coordinator Pager [phone]  (8a-5p)

## 2023-08-22 NOTE — ED Provider Notes (Signed)
 [place] EMERGENCY DEPARTMENT AT [facility] Provider Note   CSN: [id number] Arrival date & time: 08/21/23  2355     History  Chief Complaint  Patient presents with   SOB / Wheezing    Julie [name] is a 48 y.o. female with history of cocaine use disorder, major depressive disorder, diabetes, hypertension, presents with concern for ongoing cough and shortness of breath for the past week.  States her symptoms have been progressively getting worse.  Denies any fever or chills.  She reports she always has some swelling in her legs.  Denies any long periods of immobilization, or hormonal medication use such as birth control.  She was seen here yesterday, but did not start the prescribed prednisone and amlodipine as she was not able to pick it up from her pharmacy.  HPI     Home Medications Prior to Admission medications   Medication Sig Start Date End Date Taking? Authorizing Provider  amLODipine (NORVASC) 5 MG tablet Take 1 tablet (5 mg total) by mouth daily. 08/21/23   Jacalyn Lefevre, MD  amoxicillin-clavulanate (AUGMENTIN) 875-125 MG tablet Take 1 tablet by mouth every 12 (twelve) hours. Patient not taking: Reported on 06/28/2023 06/28/23   Mannie Stabile, PA-C  ibuprofen (ADVIL) 200 MG tablet Take 800 mg by mouth every 6 (six) hours as needed for fever, moderate pain (pain score 4-6) or headache.    [provider]  naproxen (NAPROSYN) 500 MG tablet Take 1 tablet (500 mg total) by mouth 2 (two) times daily. Patient not taking: Reported on 06/28/2023 06/19/23   Renne Crigler, PA-C  predniSONE (DELTASONE) 50 MG tablet Take 1 tablet (50 mg total) by mouth daily with breakfast. 08/21/23   Jacalyn Lefevre, MD      Allergies    Patient has no known allergies.    Review of Systems   Review of Systems  Respiratory:  Positive for shortness of breath.   Cardiovascular:  Positive for chest pain.    Physical Exam Updated Vital Signs BP (!) 158/69 (BP Location: Left  Arm)   Pulse (!) 116   Resp (!) 26   SpO2 94%  Physical Exam Vitals and nursing note reviewed.  Constitutional:      General: She is not in acute distress.    Appearance: She is well-developed. She is ill-appearing.  HENT:     Head: Normocephalic and atraumatic.  Eyes:     Conjunctiva/sclera: Conjunctivae normal.  Cardiovascular:     Rate and Rhythm: Regular rhythm. Tachycardia present.     Heart sounds: No murmur heard.    Comments: Radial pulse and dorsalis pedis pulse 2+ bilaterally Pulmonary:     Effort: No respiratory distress.     Comments: Increased respiratory effort  Wheezing bilaterally Abdominal:     Palpations: Abdomen is soft.     Tenderness: There is no abdominal tenderness.  Musculoskeletal:        General: No swelling.     Cervical back: Neck supple.     Comments: Mild non-pitting lower extremity edema  Skin:    General: Skin is warm and dry.     Capillary Refill: Capillary refill takes less than 2 seconds.  Neurological:     Mental Status: She is alert.  Psychiatric:        Mood and Affect: Mood normal.     ED Results / Procedures / Treatments   Labs (all labs ordered are listed, but only abnormal results are displayed) Labs Reviewed  CBC WITH DIFFERENTIAL/PLATELET - Abnormal; Notable for the following components:      Result Value   WBC 10.8 (*)    Neutro Abs 9.5 (*)    All other components within normal limits  COMPREHENSIVE METABOLIC PANEL - Abnormal; Notable for the following components:   Potassium 3.4 (*)    CO2 21 (*)    Glucose, Bld 203 (*)    Calcium 10.5 (*)    All other components within normal limits  BRAIN NATRIURETIC PEPTIDE - Abnormal; Notable for the following components:   B Natriuretic Peptide 402.1 (*)    All other components within normal limits  TROPONIN I (HIGH SENSITIVITY) - Abnormal; Notable for the following components:   Troponin I (High Sensitivity) 28 (*)    All other components within normal limits  RESP PANEL  BY RT-PCR (RSV, FLU A&B, COVID)  RVPGX2  COMPREHENSIVE METABOLIC PANEL  CBC  TROPONIN I (HIGH SENSITIVITY)    EKG EKG Interpretation Date/Time:  Monday August 22 2023 00:03:39 EST Ventricular Rate:  128 PR Interval:  126 QRS Duration:  91 QT Interval:  325 QTC Calculation: 475 R Axis:   -42  Text Interpretation: Sinus tachycardia Ventricular premature complex Aberrant conduction of SV complex(es) Consider right atrial enlargement Left anterior fascicular block Abnormal R-wave progression, late transition LVH with secondary repolarization abnormality Confirmed by Marily Memos [phone]) on 08/22/2023 1:02:08 AM  Radiology CT Angio Chest PE W and/or Wo Contrast Result Date: 08/22/2023 CLINICAL DATA:  Pulmonary embolism suspected. Shortness of breath and wheezing. EXAM: CT ANGIOGRAPHY CHEST WITH CONTRAST TECHNIQUE: Multidetector CT imaging of the chest was performed using the standard protocol during bolus administration of intravenous contrast. Multiplanar CT image reconstructions and MIPs were obtained to evaluate the vascular anatomy. RADIATION DOSE REDUCTION: This exam was performed according to the departmental dose-optimization program which includes automated exposure control, adjustment of the mA and/or kV according to patient size and/or use of iterative reconstruction technique. CONTRAST:  75mL OMNIPAQUE IOHEXOL 350 MG/ML SOLN COMPARISON:  Same day chest radiograph FINDINGS: Cardiovascular: Borderline cardiomegaly. No pericardial effusion. Normal caliber thoracic aorta. Negative for acute pulmonary embolism. Mediastinum/Nodes: Unremarkable trachea and esophagus. Shotty mediastinal and hilar lymph nodes are favored reactive. Lungs/Pleura: No focal consolidation, pleural effusion, or pneumothorax. Mild diffuse bronchial wall thickening. Upper Abdomen: No acute abnormality. Musculoskeletal: 2.0 cm cystic lesion in the medial left upper chest favored to represent a sebaceous cyst. No acute fracture.  Review of the MIP images confirms the above findings. IMPRESSION: 1. Negative for acute pulmonary embolism. 2. Mild diffuse bronchial wall thickening can be seen with bronchitis/reactive airways. Electronically Signed   By: Minerva Fester M.D.   On: 08/22/2023 03:17   DG Chest 2 View Result Date: 08/22/2023 CLINICAL DATA:  Shortness of breath and cough. EXAM: CHEST - 2 VIEW COMPARISON:  August 21, 2023 FINDINGS: The heart size and mediastinal contours are within normal limits. Both lungs are clear. The visualized skeletal structures are unremarkable. IMPRESSION: No active cardiopulmonary disease. Electronically Signed   By: Aram Candela M.D.   On: 08/22/2023 01:18   DG Chest Portable 1 View Result Date: 08/21/2023 CLINICAL DATA:  Shortness of breath EXAM: PORTABLE CHEST 1 VIEW COMPARISON:  02/27/2023 FINDINGS: The heart size and mediastinal contours are within normal limits. Both lungs are clear. The visualized skeletal structures are unremarkable. IMPRESSION: No active disease. Electronically Signed   By: Signa Kell M.D.   On: 08/21/2023 09:04    Procedures .Critical Care  Performed by: Truddie Hidden,  Marchelle Folks, PA-C Authorized by: Arabella Merles, PA-C   Critical care provider statement:    Critical care time (minutes):  30   Critical care was necessary to treat or prevent imminent or life-threatening deterioration of the following conditions:  Respiratory failure   Critical care was time spent personally by me on the following activities:  Development of treatment plan with patient or surrogate, discussions with consultants, evaluation of patient's response to treatment, examination of patient, ordering and review of laboratory studies, ordering and review of radiographic studies, ordering and performing treatments and interventions, pulse oximetry, re-evaluation of patient's condition and review of old charts   Care discussed with: admitting provider       Medications Ordered in  ED Medications  magnesium sulfate IVPB 2 g 50 mL (has no administration in time range)  potassium chloride (KLOR-CON) packet 40 mEq (has no administration in time range)  methylPREDNISolone sodium succinate (SOLU-MEDROL) 40 mg/mL injection 40 mg (has no administration in time range)  furosemide (LASIX) injection 20 mg (has no administration in time range)  ipratropium (ATROVENT) nebulizer solution 0.5 mg (has no administration in time range)  levalbuterol (XOPENEX) nebulizer solution 0.63 mg (has no administration in time range)  albuterol (PROVENTIL) (2.5 MG/3ML) 0.083% nebulizer solution 2.5 mg (has no administration in time range)  amLODipine (NORVASC) tablet 5 mg (has no administration in time range)  enoxaparin (LOVENOX) injection 60 mg (has no administration in time range)  sodium chloride flush (NS) 0.9 % injection 3 mL (has no administration in time range)  sodium chloride flush (NS) 0.9 % injection 3 mL (has no administration in time range)  0.9 %  sodium chloride infusion (has no administration in time range)  acetaminophen (TYLENOL) tablet 650 mg (has no administration in time range)    Or  acetaminophen (TYLENOL) suppository 650 mg (has no administration in time range)  ondansetron (ZOFRAN) tablet 4 mg (has no administration in time range)    Or  ondansetron (ZOFRAN) injection 4 mg (has no administration in time range)  guaiFENesin (MUCINEX) 12 hr tablet 600 mg (has no administration in time range)  pantoprazole (PROTONIX) EC tablet 40 mg (has no administration in time range)  iohexol (OMNIPAQUE) 350 MG/ML injection 75 mL (75 mLs Intravenous Contrast Given 08/22/23 0307)    ED Course/ Medical Decision Making/ A&P                                 Medical Decision Making Amount and/or Complexity of Data Reviewed Labs: ordered. Radiology: ordered.  Risk Prescription drug management.     Differential diagnosis includes but is not limited to Heart failure, PE, DVT, COVID,  flu, RSV, viral URI, strep pharyngitis, viral pharyngitis, allergic rhinitis, pneumonia, bronchitis   ED Course:  Patient ill appearing upon arrival, tachycardic into the 120s, increased respiratory rate of 26.  Hypertensive at 158/69.  She was satting in the upper 80s on room air.  She is not on oxygen at home at baseline.  Requiring 4 L nasal cannula here to bring her up to 94%.  I Ordered, and personally interpreted labs.  The pertinent results include:   CBC with leukocytosis of 10.8 CMP with slight hypokalemia at 3.4 BNP elevated at 402 COVID, flu, RSV negative Initial troponin of 21, repeat of 28  Upon re-evaluation, patient still uncomfortable appearing and breathing heavily even on nasal cannula.  She does have elevated troponins, but feel this is  likely secondary to her tachycardia.  They are remaining stable.  No ST changes on her EKG.  Feel this is less likely ACS.  Given tachycardia and shortness of breath, PE was considered.  CTA chest negative for PE.  Bronchial wall thickening was noted, which is concerning for bronchitis.  Patient has elevated BNP at 402, and given new oxygen requirement, increased respiratory rate, hypertension, concern for fluid overload and possible new heart failure.  Suspect this is likely the cause of her symptoms rather than just viral URI syndrome.  Feel she would benefit from inpatient admission.  Impression: Elevated BNP New oxygen requirement  Disposition:  Admission with Dr. Janalyn Shy  Imaging Studies ordered: I ordered imaging studies including CTA chest, chest x-ray I independently visualized the imaging with scope of interpretation limited to determining acute life threatening conditions related to emergency care.  Chest x-ray without any acute abnormalities CTA chest negative for PE.  Bronchial wall thickening concerning for bronchitis I agree with the radiologist interpretation   Cardiac Monitoring: / EKG: The patient was maintained on  a cardiac monitor.  I personally viewed and interpreted the cardiac monitored which showed an underlying rhythm of: Sinus tachycardia, no ST changes   Consultations Obtained: I requested consultation with the hospitalist Dr. Janalyn Shy,  and discussed lab and imaging findings as well as pertinent plan - they recommend: Admission  External records from outside source obtained and reviewed including ER note from yesterday where patient was discharged with course of prednisone and amlodipine.              Final Clinical Impression(s) / ED Diagnoses Final diagnoses:  Elevated brain natriuretic peptide (BNP) level  Shortness of breath    Rx / DC Orders ED Discharge Orders     None         Arabella Merles, PA-C 08/22/23 0421    Mesner, Barbara Cower, MD 08/22/23 [phone]

## 2023-08-22 NOTE — ED Notes (Signed)
 Urinary incontinence care provided , hospital gown / complete linen change . Repositioned on bed , warm blankets applied.

## 2023-08-22 NOTE — Progress Notes (Signed)
 Progress Note   Patient: Julie [name]:[id number] DOB: [birth year] DOA: 08/21/2023     0 DOS: the patient was seen and examined on 08/22/2023   Brief hospital course: 48 year old woman with PMH of asthma, depression, cocaine use who presented to the ED with complaints of cough, shortness of breath.  Diagnosed with asthma exacerbation.  Assessment and Plan:  Asthma exacerbation Presented with shortness of breath. States she was diagnosed with asthma about 10 years ago. 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 RVP negative. CXR with no active disease. CTA chest negative for PE.  Evidence of bronchitis/reactive airways. -Continue steroids. -Continue azithromycin. -Continue nebulized treatments. -Supplemental oxygen as needed.  T2DM Patient with A1c of 11.8 in 2023. A1c today is 8.5. Patient is not on any medications for diabetes. Current hyperglycemia exacerbated by steroids. -Diabetic diet. -Sliding scale insulin. -Will prescribe metformin at discharge. - Patient will likely need assistance with medications.   Lower extremity edema Possible HFpEF exacerbation. Presented with BNP of 402. TTE reveals 50 to 55% with possible diastolic dysfunction. -IV Lasix. -Transition to p.o. Lasix in a.m. as patient is approaching euvolemia.   Elevated troponin Mild troponin elevation. Likely nonischemic myocardial injury. TTE shows EF 50 to 55%, no regional wall motion abnormalities. -No further evaluation indicated.   Essential hypertension History of hypertension but has not been taking medication. -Continue with Norvasc, Lasix for now, and plan to transition to HCTZ with Norvasc at discharge. - Patient will likely need assistance with medications.   Hypokalemia -Replete as needed   History major depressive disorder Patient reported she has been not taking any depressive medication given her depression has been completely in remission. -Will continue to monitor   Chronic smoking of  smoking cigarette Patient smokes about 1 pack/day. States she has been smoking less over the past few days. -High-dose nicotine patch.       Subjective: Patient still complains of shortness of breath.  She is a current smoker and smokes about 1 pack/day.  She states she has not had stable living conditions and therefore did not continue with metformin that was prescribed a couple of years ago.  She has not been taking any medication at home.   Physical Exam: Vitals:   08/22/23 0442 08/22/23 0529 08/22/23 0853 08/22/23 1114  BP:  (!) 166/84 (!) 175/96 (!) 155/92  Pulse:  (!) 108 (!) 112 96  Resp:  (!) 22 18 20   Temp: (!) 97.3 F (36.3 C)  (!) 97.3 F (36.3 C) 98.2 F (36.8 C)  TempSrc: Temporal  Oral   SpO2:  98% 99% 96%     General: Alert, oriented X3  Eyes: Pupils equal, reactive  Oral cavity: moist mucous membranes  Head: Atraumatic, normocephalic  Neck: supple  Chest: Poor air entry bilaterally.  Inspiratory wheezes. CVS: S1,S2 RRR. No murmurs  Abd: No distention, soft, non-tender. No masses palpable  Extr: No edema   MSK: No joint deformities or swelling  Neurological: Grossly intact.    Data Reviewed:     Latest Ref Rng & Units 08/22/2023    4:14 AM 08/22/2023    1:05 AM 08/21/2023   10:32 AM  CBC  WBC 4.0 - 10.5 K/uL 10.7  10.8  9.3   Hemoglobin 12.0 - 15.0 g/dL 81.1  91.4  78.2   Hematocrit 36.0 - 46.0 % 41.4  41.4  43.2   Platelets 150 - 400 K/uL 189  200  197       Latest Ref Rng & Units 08/22/2023  4:14 AM 08/22/2023    1:05 AM 08/21/2023    8:58 AM  BMP  Glucose 70 - 99 mg/dL 161  096  045   BUN 6 - 20 mg/dL 6  6  7    Creatinine 0.44 - 1.00 mg/dL 4.09  8.11  9.14   Sodium 135 - 145 mmol/L 141  142  143   Potassium 3.5 - 5.1 mmol/L 3.6  3.4  4.5   Chloride 98 - 111 mmol/L 106  107  111   CO2 22 - 32 mmol/L 21  21  18    Calcium 8.9 - 10.3 mg/dL 78.2  95.6  9.9      Family Communication: Spoke with patient and family at  bedside.  Disposition: Status is: Inpatient Remains inpatient appropriate because: Requiring IV medications and nebulizer treatments for asthma exacerbation, IV diuretics for CHF exacerbation  Planned Discharge Destination: Home    Time spent: 50 minutes  Morning admit No charge  Author: MDALA-GAUSI, Gwenette Greet, MD 08/22/2023 1:39 PM  For on call review [email or web address].

## 2023-08-22 NOTE — Progress Notes (Signed)
 Heart Failure Navigator Progress Note  Assessed for Heart & Vascular TOC clinic readiness.  Patient does not meet criteria due to EF 50-55%, patient admitted with Asthma Exacerbation. .   Navigator will sign off at this time.   Rhae Hammock, BSN, Scientist, clinical (histocompatibility and immunogenetics) Only

## 2023-08-23 ENCOUNTER — Inpatient Hospital Stay (HOSPITAL_COMMUNITY): Payer: MEDICAID

## 2023-08-23 LAB — CBC
HCT: 47 % — ABNORMAL HIGH (ref 36.0–46.0)
Hemoglobin: 15.1 g/dL — ABNORMAL HIGH (ref 12.0–15.0)
MCH: 26.8 pg (ref 26.0–34.0)
MCHC: 32.1 g/dL (ref 30.0–36.0)
MCV: 83.5 fL (ref 80.0–100.0)
Platelets: 193 10*3/uL (ref 150–400)
RBC: 5.63 MIL/uL — ABNORMAL HIGH (ref 3.87–5.11)
RDW: 14.6 % (ref 11.5–15.5)
WBC: 8.3 10*3/uL (ref 4.0–10.5)
nRBC: 0 % (ref 0.0–0.2)

## 2023-08-23 LAB — BASIC METABOLIC PANEL
Anion gap: 7 (ref 5–15)
BUN: 15 mg/dL (ref 6–20)
CO2: 27 mmol/L (ref 22–32)
Calcium: 10.5 mg/dL — ABNORMAL HIGH (ref 8.9–10.3)
Chloride: 104 mmol/L (ref 98–111)
Creatinine, Ser: 0.7 mg/dL (ref 0.44–1.00)
GFR, Estimated: >60 mL/min (ref >60)
Glucose, Bld: 210 mg/dL — ABNORMAL HIGH (ref 70–99)
Potassium: 3.6 mmol/L (ref 3.5–5.1)
Sodium: 138 mmol/L (ref 135–145)

## 2023-08-23 LAB — GLUCOSE, CAPILLARY
Glucose-Capillary: 218 mg/dL — ABNORMAL HIGH (ref 70–99)
Glucose-Capillary: 279 mg/dL — ABNORMAL HIGH (ref 70–99)
Glucose-Capillary: 155 mg/dL — ABNORMAL HIGH (ref 70–99)

## 2023-08-23 LAB — SEDIMENTATION RATE: Sed Rate: 2 mm/h (ref 0–22)

## 2023-08-23 LAB — C-REACTIVE PROTEIN: CRP: 2 mg/dL — ABNORMAL HIGH (ref <1.0)

## 2023-08-23 LAB — TROPONIN I (HIGH SENSITIVITY)
Troponin I (High Sensitivity): 48 ng/L — ABNORMAL HIGH (ref <18)
Troponin I (High Sensitivity): 60 ng/L — ABNORMAL HIGH (ref <18)

## 2023-08-23 MED ORDER — MORPHINE SULFATE (PF) 2 MG/ML IV SOLN
2.0000 mg | Freq: Once | INTRAVENOUS | Status: AC
  Administered 2023-08-23: 2 mg via INTRAVENOUS
  Filled 2023-08-23: qty 1

## 2023-08-23 MED ORDER — OXYCODONE HCL 5 MG PO TABS
5.0000 mg | ORAL_TABLET | Freq: Four times a day (QID) | ORAL | Status: DC | PRN
  Administered 2023-08-23 – 2023-08-24 (×3): 5 mg via ORAL
  Filled 2023-08-23 (×3): qty 1

## 2023-08-23 MED ORDER — IBUPROFEN 200 MG PO TABS: 600.0000 mg | ORAL_TABLET | Freq: Four times a day (QID) | ORAL | Status: DC | PRN

## 2023-08-23 MED ORDER — HYDRALAZINE HCL 20 MG/ML IJ SOLN: 10.0000 mg | Freq: Four times a day (QID) | INTRAMUSCULAR | Status: DC | PRN

## 2023-08-23 MED ORDER — PREDNISONE 20 MG PO TABS
40.0000 mg | ORAL_TABLET | Freq: Every day | ORAL | Status: DC
  Administered 2023-08-24 – 2023-08-25 (×2): 40 mg via ORAL
  Filled 2023-08-23 (×2): qty 2

## 2023-08-23 MED ORDER — HYDRALAZINE HCL 20 MG/ML IJ SOLN
10.0000 mg | Freq: Four times a day (QID) | INTRAMUSCULAR | Status: DC | PRN
  Administered 2023-08-23 – 2023-08-24 (×2): 10 mg via INTRAVENOUS
  Filled 2023-08-23 (×3): qty 1

## 2023-08-23 MED ORDER — KETOROLAC TROMETHAMINE 30 MG/ML IJ SOLN
30.0000 mg | Freq: Four times a day (QID) | INTRAMUSCULAR | Status: AC
  Administered 2023-08-23 – 2023-08-24 (×4): 30 mg via INTRAVENOUS
  Filled 2023-08-23 (×4): qty 1

## 2023-08-23 MED ORDER — CALCIUM CARBONATE ANTACID 500 MG PO CHEW
400.0000 mg | CHEWABLE_TABLET | Freq: Once | ORAL | Status: AC
  Administered 2023-08-23: 400 mg via ORAL
  Filled 2023-08-23: qty 2

## 2023-08-23 MED ORDER — LOSARTAN POTASSIUM 50 MG PO TABS
25.0000 mg | ORAL_TABLET | Freq: Every day | ORAL | Status: DC
  Administered 2023-08-23 – 2023-08-25 (×3): 25 mg via ORAL
  Filled 2023-08-23 (×3): qty 1

## 2023-08-23 NOTE — Discharge Instructions (Addendum)
 [employer] Benefits: Call [phone]  [facility] assistance programs Crisis assistance programs  -[facility]eless[facility] Arts development officer. If you are experiencing homelessness in [place], [place], your first point of contact should be Pensions consultant. You can reach Coordinated Entry by calling ([phone]) [phone] or by emailing [email or web address] [email or web address].  Community access points: [employer] [phone], [place]) every Tuesday from 9am-10am. [facility] ([address] [place] [place]) every Wednesday from 8am-9am.   -[place] Coordinated Re-entry Marcy Panning: Dial 211 and request. Offers referrals to homeless shelters in the area.    -The [employer] [phone]) offers several services to local families, as funding allows. The Emergency Assistance Program (EAP), which they administer, provides household goods, free food, clothing, and financial aid to people in need in the [facility] area. The EAP program does have some qualification, and counselors will interview clients for financial assistance by written referral only. Referrals need to be made by the Department of Social Services or by other EAP approved human services agencies or charities in the area.  -[facility] of [employer], which can be reached at [phone], offers emergency assistance programs for those in need of help, such as food, rent assistance, a soup kitchen, shelter, and clothing. They are based in [facility] but provide a number of services to those that qualify for assistance.   [facility] Department of Social Services may be able to offer temporary financial assistance and cash grants for paying rent and utilities, Help may be provided for local county residents who may be experiencing personal crisis when other resources, including government programs, are not available. Call [phone]  -High [employer] is a [employer] agency, The organization can offer emergency assistance for paying rent, Care[employer] Rx, utilities, food, household products and furniture. They offer extensive emergency and transitional housing for families, children and single women, and also run a Boy's and [employer]. Thrift Shops, Secondary school teacher, and other aid offered too. [address], [place], [place] [address], [phone]  -[place] Low Income Energy Assistance Program -- This is offered for [facility] families. The federal government created [employer] Program provides a one-time cash grant payment to help eligible low-income families pay their electric and heating bills. [address] [place], [place] [address], [phone]  -[place] Emergency Assistance -- A program offers emergency utility and rent funds for greater [employer] area residents. The program can also provide counseling and referrals to charities and government programs. Also provides food and a free meal program that serves lunch Mondays - Saturdays and dinner seven days per week to individuals in the community. [address], [place], [place] [address], [phone]  -[employer] - Offers affordable apartment and housing communities across      [place]. The low income and seniors can access public housing, rental assistance to qualified applicants, and apply for the section 8 rent subsidy program. Other programs include Chiropractor and Engineer, maintenance. [address] [place], [place] [address], dial [phone].  -The [facility] provides transitional housing to veterans and the disabled. Clients will also access other services too, including assistance in applying for Disability, life skills classes, case management, and assistance in finding  permanent housing. [address] [place], [place] [address], call [phone]  -[place] Transitional Housing through [facility]  Ministry is for people who were just evicted or that are formerly homeless. The non-profit will also help then gain self-sufficiency, find a home or apartment to live in, and also provides information on rent assistance when needed. Phone ([phone]  -The [place] [employer] [facility] helps low income, elderly, or disabled residents in seven counties in the [place] ([place], [place], [place], [place], [place], [place], [place], and [place]) save energy and reduce their utility bills by improving energy efficiency. Phone [phone].  -[employer] is located in the [place] [facility] in the [employer], [address], [place], [place] [address]. Parking is in the rear of the building. Phone: [phone]   General Email: info@gsohc .org  GHC provides free housing counseling assistance in locating affordable rental housing or housing with support services for families and individuals in crisis and the chronically homeless. We provide potential resources for other housing needs like utilities. Our trained counselors also work with clients on budgeting and financial literacy in effort to empower them to take control of their financial situations. [employer] collaborates with homeless service providers and other stakeholders as part of the [employer] COC (Continuum of Care). The (COC) is a regional/local planning body that coordinates housing and services funding for homeless families and individuals. The role of [facility] in the COC is through housing counseling to work with people we serve on diversion strategies for those that are at imminent risk of becoming homeless. We also work with the Coordinated Assessment/Entry Specialist who  attempts to find temporary solutions and/or connects the people to Housing First, Rapid Re-housing or transitional housing programs. Our Homelessness Prevention Housing Counselors meet with clients on business days (Monday-Fridays, except scheduled holidays) from 8:30 am to 4:30 pm.  Legal assistance for evictions, foreclosure, and more -If you need free legal advice on civil issues, such as foreclosures, evictions, Electronics engineer, government programs, domestic issues and more, Armed forces operational officer Aid of [facility]) is a Associate Professor firm that provides free legal services and counsel to lower income people, seniors, disabled, and others, The goal is to ensure everyone has access to justice and fair representation. Call them at [phone].  [facility] for Housing and Community Studies can provide info about obtaining legal assistance with evictions. Phone [phone].  Data processing manager  The [employer], [employer]. offers job and Dispensing optician. Resources are focused on helping students obtain the skills and experiences that are necessary to compete in today's challenging and tight job market. The non-profit faith-based community action agency offers internship trainings as well as classroom instruction. Classes are tailored to meet the needs of people in the [facility] region. [place], [place] [address], [phone]  Foreclosure prevention/Debt Services Family Services of the [employer] Credit Counseling Service inludes debt and foreclosure prevention programs for local families. This includes money management, financial advice, budget review and development of a written action plan with a Pensions consultant to help solve specific individual financial problems. In addition, housing and mortgage counselors can also provide pre- and post-purchase homeownership counseling, default resolution counseling (to prevent foreclosure)  and reverse mortgage counseling. A Debt Management Program allows people and families with a high level of credit card or medical debt to consolidate and repay consumer debt and loans to creditors and rebuild positive credit ratings and scores. Contact (336) [phone].  Community clinics in [place] -Health Department [facility]: [address] [place], [place], [address]. [phone].  -  Health Department [place] [facility]: [address], [facility] [place], [address]. [phone].  -[facility] offers medical care through a group of doctors, pharmacies and other healthcare related agencies that offer services for low income, uninsured adults in [place]. Also offers adult Dental care and assistance with applying for an [employer]. Call [phone].   [facility]. This center provides low-cost health care to those without health insurance. Services offered include an onsite pharmacy. Phone [phone]. [address], [place].  -Medication Assistance Program serves as a link between pharmaceutical companies and patients to provide low cost or free prescription medications. This service is available for residents who meet certain income restrictions and have no insurance coverage. PLEASE CALL [phone] Ginette Otto) OR [phone] ([place])  -One Step Further: Materials engineer, The [employer] Support & Nutrition Program, [employer]. Call (754) [phone]/ [phone].  Food pantry and assistance -[facility]-Food Bank: [address], [place] [address]. Phone [phone]  -[facility] Food Pantry: [address], [place], [place] [address]. [phone].  -Missionary Ministry: has the purpose of visiting the sick and shut-ins and provide for needs in the surrounding communities. Call 9540365437. Email: [email or web address] This program provides: Food  box for seniors, Financial assistance, Food to meet basic nutritional needs.  -Meals on Wheels with Senior Resources: [facility] residents age 25 and over who are homebound and unable to obtain and prepare a nutritious meal for themselves are eligible for this service. There may be a waiting list in certain parts of [facility] if the route in that area is full. If you are in [facility] call [phone] to register. For all other areas call [phone] to register.  -Greater Dietitian: https://findfood.[email or web address]  TRANSPORTATION: -[employer] Department of Health: Call [facility] and [employer] at [phone] for details. AttractionGuides.es  -Access [place]: Access [place] is the [employer] Agency's shared-ride transportation service for eligible riders who have a disability that prevents them from riding the fixed route bus. Call [phone]. Access [place] riders must pay a fare of $1.50 per trip, or may purchase a 10-ride punch card for $14.00 ($1.40 per ride) or a 40-ride punch card for $48.00 ($1.20 per ride).  -The Shepherd's WHEELS rideshare transportation service is provided for senior citizens (60+) who live independently within [place] city limits and are unable to drive or have limited access to transportation. Call [phone] to schedule an appointment.  -[place] Transportation: For Medicare or Medicaid recipients call [phone]?[facility], wheelchair Zenaida Niece, and ambulatory quotes available.   FLEEING VIOLENCE: -Family Services of the [place]- 24/7 Crisis line 4060350728) -[facility] Justice Centers: (336) 641-[phone]) [phone])  [place] 2-1-1 is another useful way to locate resources in the community. Visit ShedSizes.ch to find service information online. If you need additional assistance, 2-1-1 Referral  Specialists are available 24 hours a day, every day by dialing 2-1-1 or [phone] from any phone. The call is free, confidential, and available in any language.  Affordable Housing Search http://www.nchousingsearch.org  [facility])   M-F 8a-3p [address] [place], [place] [address] [phone] Services include: laundry, barbering, support groups, case management, phone & computer access, showers, AA/NA mtgs, mental health/substance abuse nurse, job skills class, disability information, VA assistance, spiritual classes, etc. Winter Shelter available when temperatures are less than 32 degrees.   HOMELESS SHELTERS [employer] Night Shelter at [employer]  Ministry- Call [phone] ext. 347 or ext. [phone]. Located at [address] [place], [place] [address]  [facility] Mens Shelter- Call [phone]. Located at [address], [place] [address].  [facility]- [employer]. Call [phone]. Office located at [address] [place] [address].  [facility] Family Housing through [facility] [phone].  [facility] Family Shelter- Call [phone]. Located at [address] [place], [place], [place] [address].  Room at the Inn-For Pregnant mothers. Call [phone]. Located at [address] [place], [address].  [facility]-For men in [place]. Call [phone]. [facility]-Shelter in [place]. Call [phone].  Home of [employer] for [employer] [phone]. Office located at [address] [place], [address].  [employer] be agreeable to help with chores. Call [phone] ext. [phone].  Men's: [address] [place], [place] [address]. Women's: [facility]  [address] [place], [place] [address]  Crisis Services [facility] Mobile Crisis Management- [phone]  [facility] [address] [place],  [place] [address]. Phone: [phone]

## 2023-08-23 NOTE — Progress Notes (Signed)
 Progress Note   Patient: Julie [name] DOB: 08-16-1975 DOA: 08/21/2023     1 DOS: the patient was seen and examined on 08/23/2023   Brief hospital course: 48 year old woman with PMH of asthma, depression, cocaine use who presented to the ED with complaints of cough, shortness of breath.  Diagnosed with asthma exacerbation.  Assessment and Plan:  Asthma exacerbation Presented with shortness of breath. States she was diagnosed with asthma about 10 years ago. 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 RVP negative. CXR with no active disease. CTA chest negative for PE.  Evidence of bronchitis/reactive airways. -Continue steroids. -Continue azithromycin. -Continue nebulized treatments. -Supplemental oxygen as needed.  Subcostal and epigastric discomfort Chest x-ray shows no acute changes. Patient seems to be having pleuritic chest pain as well as epigastric tenderness. -IV Toradol for now. - TUMS  T2DM Patient with A1c of 11.8 in 2023. A1c today is 8.5. Patient is not on any medications for diabetes. Current hyperglycemia exacerbated by steroids. -Diabetic diet. -Sliding scale insulin. -Will prescribe metformin at discharge. - Patient will likely need assistance with medications.   Lower extremity edema Possible HFpEF exacerbation. Presented with BNP of 402. TTE reveals 50 to 55% with possible diastolic dysfunction. -s/p IV Lasix, PO lasix. -Will continue p.o. Lasix for now but consider switching to HCTZ prior to discharge.   Elevated troponin Mild troponin elevation. Likely nonischemic myocardial injury. TTE shows EF 50 to 55%, no regional wall motion abnormalities. -No further evaluation indicated.   Essential hypertension History of hypertension but has not been taking medication. -Continue with Norvasc, Lasix for now, and plan to transition to HCTZ with Norvasc at discharge. - Patient will likely need assistance with medications. -Started on losartan due to  uncontrolled hypertension and DM.  Hypokalemia -Replete as needed   History major depressive disorder Patient reported she has been not taking any depressive medication given her depression has been completely in remission. -Will continue to monitor   Chronic smoking of smoking cigarette Patient smokes about 1 pack/day. States she has been smoking less over the past few days. -High-dose nicotine patch.  Subjective: Patient states she feels worse today.  Complains of sharp pain on both sides which radiates to the middle.  Noted to have epigastric tenderness.  Physical Exam: Vitals:   08/23/23 0319 08/23/23 0453 08/23/23 0500 08/23/23 0748  BP:  (!) 162/97  (!) 169/109  Pulse:  (!) 112  (!) 101  Resp:  18    Temp:  98.9 F (37.2 C)  98 F (36.7 C)  TempSrc:  Oral    SpO2: 95% 92%  91%  Weight:   105.3 kg      General: Alert, oriented X3, appears uncomfortable Eyes: Pupils equal, reactive  Oral cavity: moist mucous membranes  Head: Atraumatic, normocephalic  Neck: supple  Chest: Poor air entry bilaterally.  No wheezes. CVS: S1,S2 RRR. No murmurs  Abd: No distention, soft, epigastric tenderness + no masses palpable  Extr: No edema   MSK: No joint deformities or swelling  Neurological: Grossly intact.    Data Reviewed:     Latest Ref Rng & Units 08/23/2023    6:44 AM 08/22/2023    4:14 AM 08/22/2023    1:05 AM  CBC  WBC 4.0 - 10.5 K/uL 8.3  10.7  10.8   Hemoglobin 12.0 - 15.0 g/dL 34.7  42.5  95.6   Hematocrit 36.0 - 46.0 % 47.0  41.4  41.4   Platelets 150 - 400 K/uL 193  189  200       Latest Ref Rng & Units 08/23/2023    6:44 AM 08/22/2023    4:14 AM 08/22/2023    1:05 AM  BMP  Glucose 70 - 99 mg/dL 578  469  629   BUN 6 - 20 mg/dL 15  6  6    Creatinine 0.44 - 1.00 mg/dL 5.28  4.13  2.44   Sodium 135 - 145 mmol/L 138  141  142   Potassium 3.5 - 5.1 mmol/L 3.6  3.6  3.4   Chloride 98 - 111 mmol/L 104  106  107   CO2 22 - 32 mmol/L 27  21  21    Calcium 8.9 - 10.3  mg/dL 01.0  27.2  53.6      Family Communication: Spoke with patient and family at bedside.  Disposition: Status is: Inpatient Remains inpatient appropriate because: Requiring IV medications and nebulizer treatments for asthma exacerbation  Planned Discharge Destination: Home    Time spent: 50 minutes  Author: Marcine Matar, MD 08/23/2023 3:31 PM  For on call review [email or web address].

## 2023-08-23 NOTE — Plan of Care (Signed)

## 2023-08-23 NOTE — Inpatient Diabetes Management (Addendum)
 Inpatient Diabetes Program Recommendations  AACE/ADA: New Consensus Statement on Inpatient Glycemic Control (2015)  Target Ranges:  Prepandial:   less than 140 mg/dL      Peak postprandial:   less than 180 mg/dL (1-2 hours)      Critically ill patients:  140 - 180 mg/dL   Lab Results  Component Value Date   GLUCAP 218 (H) 08/23/2023   HGBA1C 8.5 (H) 08/22/2023    Review of Glycemic Control  Latest Reference Range & Units 08/22/23 08:10 08/22/23 10:57 08/22/23 12:14 08/22/23 16:39 08/22/23 20:53 08/23/23 08:43  Glucose-Capillary 70 - 99 mg/dL 272 (H) 536 (H) 644 (H) 189 (H) 268 (H) 218 (H)   Diabetes history: DM 2 Outpatient Diabetes medications:  None Current orders for Inpatient glycemic control:  Novolog 0-9 units tid with meals  Solumedrol 40 mg IV q 12 hours Inpatient Diabetes Program Recommendations:   While on steroids, consider adding Semglee 20 units daily and Novolog meal coverage 3 units tid with meals (hold if patient eats less than 50% or NPO). Note plans to start oral agent for DM such as metformin at discharge.   Thanks,  Lorenza Cambridge, RN, BC-ADM Inpatient Diabetes Coordinator Pager [phone]  (8a-5p)

## 2023-08-23 NOTE — TOC Initial Note (Addendum)
 Transition of Care [facility]) - Initial/Assessment Note    Patient Details  Name: Julie [name] MRN: [id number] Date of Birth: [birth year]  Transition of Care [facility]) CM/SW Contact:    Marliss Coots, LCSW Phone Number: 08/23/2023, 1:10 PM  Clinical Narrative:                  1:10 PM CSW introduced herself and role to patient at bedside. CSW followed up with patient on SDOH needs (housing, transportation, food, utilities). Patient informed CSW that she has recently been living with niece and paying her for rent but niece is being evicted within one week. Patient also informed CSW that she uses the public bus for transportation and has food stamps. CSW offered bus passes for discharge. Patient accepted CSW offer and CSW informed patient how to obtain bus passes (request bedside RN at discharge) which patient expressed understanding of. Patient declined CSW offer of food pantry resources as she is "aware of pantries in the area." CSW offered housing/utility/shelter resources. Patient accepted CSW offer and requested one meal pass for significant other.   4:21 PM CSW returned to patient's bedside to follow up on SDOH. Patient's significant other and hospitalist were also present at bedside. Patient consented CSW to speak in front of visitor and hospitalist. CSW provided [facility] shelter resources ([facility], [employer], [facility]) and informed patient of SDOH resources in [place] (transportation, housing, utilities). Hospitalist inquired about RX financial assistance. CSW informed patient that [facility] supervisor is in the process of obtaining new meal vouchers, which patient expressed understanding of.    Barriers to Discharge: Continued Medical Work up   Patient Goals and CMS Choice            Expected Discharge Plan and Services In-house Referral: Clinical Social Work     Living arrangements for the past 2 months: Apartment, Homeless                                       Prior Living Arrangements/Services Living arrangements for the past 2 months: Apartment, Homeless Lives with:: Relatives Patient language and need for interpreter reviewed:: Yes              Criminal Activity/Legal Involvement Pertinent to Current Situation/Hospitalization: No - Comment as needed  Activities of Daily Living   ADL Screening (condition at time of admission) Independently performs ADLs?: Yes (appropriate for developmental age) Is the patient deaf or have difficulty hearing?: No Does the patient have difficulty seeing, even when wearing glasses/contacts?: No Does the patient have difficulty concentrating, remembering, or making decisions?: No  Permission Sought/Granted Permission sought to share information with : Family Supports Permission granted to share information with : No (Contact information on chart)  Share Information with NAME: Julie [name]     Permission granted to share info w Relationship: Sister  Permission granted to share info w Contact Information: [phone]  Emotional Assessment Appearance:: Appears stated age Attitude/Demeanor/Rapport: Engaged Affect (typically observed): Accepting, Appropriate, Adaptable, Calm, Stable, Pleasant Orientation: : Oriented to Place, Oriented to Self, Oriented to  Time, Oriented to Situation Alcohol / Substance Use: Not Applicable Psych Involvement: No (comment)  Admission diagnosis:  Shortness of breath [R06.02] Elevated brain natriuretic peptide (BNP) level [R79.89] Asthma exacerbation [J45.901] Patient Active Problem List   Diagnosis Date Noted   Asthma, chronic, unspecified asthma severity, with acute exacerbation 08/22/2023   Lower extremity  edema 08/22/2023   Elevated brain natriuretic peptide (BNP) level 08/22/2023   Concern for new onset of CHF with exacerbation (congestive heart failure) (HCC) 08/22/2023   Hypokalemia 08/22/2023   History of essential hypertension 08/22/2023    Elevated troponin 08/22/2023   Asthma exacerbation 08/22/2023   Continuous dependence on cigarette smoking 08/22/2023   Shortness of breath 08/22/2023   MDD (major depressive disorder), recurrent severe, without psychosis (HCC) 03/17/2023   Cocaine use disorder, severe, dependence (HCC) 12/12/2016   PCP:  Oneita Hurt, No Pharmacy:   [id number] - [place] [address] [place]) [place] [address] Phone: [phone] Fax: [phone]  [facility] [address] [place] [address] Phone: [phone] Fax: [phone]     Social Drivers of Health (SDOH) Social History: SDOH Screenings   Food Insecurity: Food Insecurity Present (08/22/2023)  Housing: High Risk (08/22/2023)  Transportation Needs: Unmet Transportation Needs (08/22/2023)  Utilities: At Risk (08/22/2023)  Alcohol Screen: Low Risk  (03/18/2023)  Depression (PHQ2-9): Medium Risk (03/05/2019)  Tobacco Use: High Risk (08/22/2023)   SDOH Interventions: Food Insecurity Interventions: Other (Comment) (has food stamps and is aware of local food pantries)   Readmission Risk Interventions     No data to display

## 2023-08-24 DIAGNOSIS — R7989 Other specified abnormal findings of blood chemistry: Secondary | ICD-10-CM | POA: Diagnosis not present

## 2023-08-24 DIAGNOSIS — R0602 Shortness of breath: Secondary | ICD-10-CM

## 2023-08-24 DIAGNOSIS — J45901 Unspecified asthma with (acute) exacerbation: Secondary | ICD-10-CM | POA: Diagnosis not present

## 2023-08-24 DIAGNOSIS — I10 Essential (primary) hypertension: Secondary | ICD-10-CM | POA: Diagnosis not present

## 2023-08-24 LAB — MAGNESIUM: Magnesium: 1.6 mg/dL — ABNORMAL LOW (ref 1.7–2.4)

## 2023-08-24 LAB — BASIC METABOLIC PANEL
Anion gap: 11 (ref 5–15)
BUN: 18 mg/dL (ref 6–20)
CO2: 24 mmol/L (ref 22–32)
Calcium: 10.2 mg/dL (ref 8.9–10.3)
Chloride: 103 mmol/L (ref 98–111)
Creatinine, Ser: 0.8 mg/dL (ref 0.44–1.00)
GFR, Estimated: >60 mL/min (ref >60)
Glucose, Bld: 199 mg/dL — ABNORMAL HIGH (ref 70–99)
Potassium: 3.8 mmol/L (ref 3.5–5.1)
Sodium: 138 mmol/L (ref 135–145)

## 2023-08-24 LAB — TROPONIN I (HIGH SENSITIVITY)
Troponin I (High Sensitivity): 55 ng/L — ABNORMAL HIGH (ref <18)
Troponin I (High Sensitivity): 41 ng/L — ABNORMAL HIGH (ref <18)
Troponin I (High Sensitivity): 38 ng/L — ABNORMAL HIGH (ref <18)

## 2023-08-24 LAB — CBC
HCT: 49.5 % — ABNORMAL HIGH (ref 36.0–46.0)
Hemoglobin: 16.1 g/dL — ABNORMAL HIGH (ref 12.0–15.0)
MCH: 27 pg (ref 26.0–34.0)
MCHC: 32.5 g/dL (ref 30.0–36.0)
MCV: 83.1 fL (ref 80.0–100.0)
Platelets: 182 10*3/uL (ref 150–400)
RBC: 5.96 MIL/uL — ABNORMAL HIGH (ref 3.87–5.11)
RDW: 14.8 % (ref 11.5–15.5)
WBC: 5.8 10*3/uL (ref 4.0–10.5)
nRBC: 0 % (ref 0.0–0.2)

## 2023-08-24 LAB — GLUCOSE, CAPILLARY
Glucose-Capillary: 248 mg/dL — ABNORMAL HIGH (ref 70–99)
Glucose-Capillary: 280 mg/dL — ABNORMAL HIGH (ref 70–99)
Glucose-Capillary: 301 mg/dL — ABNORMAL HIGH (ref 70–99)
Glucose-Capillary: 251 mg/dL — ABNORMAL HIGH (ref 70–99)

## 2023-08-24 LAB — LIPASE, BLOOD: Lipase: 30 U/L (ref 11–51)

## 2023-08-24 MED ORDER — METOPROLOL TARTRATE 5 MG/5ML IV SOLN
5.0000 mg | Freq: Once | INTRAVENOUS | Status: AC
  Administered 2023-08-24: 5 mg via INTRAVENOUS
  Filled 2023-08-24: qty 5

## 2023-08-24 MED ORDER — MORPHINE SULFATE (PF) 2 MG/ML IV SOLN: 2.0000 mg | INTRAVENOUS | Status: DC | PRN

## 2023-08-24 MED ORDER — POTASSIUM CHLORIDE 20 MEQ PO PACK
40.0000 meq | PACK | Freq: Once | ORAL | Status: AC
  Administered 2023-08-24: 40 meq via ORAL
  Filled 2023-08-24: qty 2

## 2023-08-24 MED ORDER — DILTIAZEM HCL 25 MG/5ML IV SOLN
5.0000 mg | Freq: Once | INTRAVENOUS | Status: AC
  Administered 2023-08-24: 5 mg via INTRAVENOUS
  Filled 2023-08-24: qty 5

## 2023-08-24 MED ORDER — NITROGLYCERIN 0.4 MG SL SUBL
0.4000 mg | SUBLINGUAL_TABLET | SUBLINGUAL | Status: DC | PRN
  Administered 2023-08-24: 0.4 mg via SUBLINGUAL
  Filled 2023-08-24 (×2): qty 1

## 2023-08-24 MED ORDER — MAGNESIUM SULFATE 4 GM/100ML IV SOLN
4.0000 g | Freq: Once | INTRAVENOUS | Status: AC
  Administered 2023-08-24: 4 g via INTRAVENOUS
  Filled 2023-08-24: qty 100

## 2023-08-24 MED ORDER — ASPIRIN 81 MG PO CHEW
324.0000 mg | CHEWABLE_TABLET | Freq: Once | ORAL | Status: AC
  Administered 2023-08-24: 324 mg via ORAL
  Filled 2023-08-24: qty 4

## 2023-08-24 MED ORDER — METOPROLOL TARTRATE 25 MG PO TABS
25.0000 mg | ORAL_TABLET | Freq: Two times a day (BID) | ORAL | Status: DC
  Administered 2023-08-24 – 2023-08-25 (×3): 25 mg via ORAL
  Filled 2023-08-24 (×3): qty 1

## 2023-08-24 NOTE — Progress Notes (Addendum)
 Tele called about sustained HR in the 140s. RN found the patient sitting on the edge of the bed appearing to be in distress. Patient's RR 40 and BP elevated to 180/114. PRN Hydralazine and nebulizer treatment given with little improvement. Patient then complained of nausea. Zofran given with relief. MD notified via secure chat, see new orders. EKG performed and Cardizem also given. Patient denies chest pain, headache, or vision changes.

## 2023-08-24 NOTE — Inpatient Diabetes Management (Signed)
 Inpatient Diabetes Program Recommendations  AACE/ADA: New Consensus Statement on Inpatient Glycemic Control (2015)  Target Ranges:  Prepandial:   less than 140 mg/dL      Peak postprandial:   less than 180 mg/dL (1-2 hours)      Critically ill patients:  140 - 180 mg/dL   Lab Results  Component Value Date   GLUCAP 248 (H) 08/24/2023   HGBA1C 8.5 (H) 08/22/2023    Review of Glycemic Control  Latest Reference Range & Units 08/24/23 09:28  Glucose-Capillary 70 - 99 mg/dL 098 (H)  (H): Data is abnormally high Diabetes history: DM 2 Outpatient Diabetes medications:  None Current orders for Inpatient glycemic control:  Novolog 0-9 units tid with meals  Solumedrol 40 mg IV q 12 hours, now tapered to Prednisone 40 mg QD  Inpatient Diabetes Program Recommendations:   While on steroids, consider adding Semglee 12 units every day.   Thanks, Lujean Rave, MSN, RNC-OB Diabetes Coordinator [phone] (8a-5p)

## 2023-08-24 NOTE — Progress Notes (Signed)
 PROGRESS NOTE    Julie [name]  WUJ:[id number] DOB: 04-05-1976 DOA: 08/21/2023 PCP: Pcp, No  Chief Complaint  Patient presents with   SOB / Wheezing    Hospital Course:  Julie [name] is 48 y.o. female with history of asthma, depression, cocaine abuse, HFpEF, HTN, medication nonadherent, diabetes, presents to the ED complaining of cough and shortness of breath.  She was diagnosed with an asthma exacerbation was admitted.  Subjective: Overnight patient complained of chest pain.  High-sensitivity troponin was performed and found to be 41, which is the lowest it has been on admission.  EKG ordered and showed sinus tach. At the time of my evaluation patient denies any chest pain and does not want to participate in evaluation. She requests to sleep instead. Bedside RN reports she has intermittently required supplemental O2.    Objective: Vitals:   08/24/23 0500 08/24/23 0519 08/24/23 0600 08/24/23 0636  BP:  (!) 156/122 (!) 142/86   Pulse:  (!) 134 (!) 109 (!) 108  Resp:  (!) 28 (!) 24   Temp:  98.1 F (36.7 C) 98.2 F (36.8 C)   TempSrc:  Oral    SpO2:  94% 94%   Weight: 110.2 kg      No intake or output data in the 24 hours ending 08/24/23 0925 Filed Weights   08/23/23 0500 08/24/23 0500  Weight: 105.3 kg 110.2 kg    Examination: General exam: Appears calm and comfortable, NAD  Respiratory system: No work of breathing, symmetric chest wall expansion, no wheeze appreciated. Cardiovascular system: S1 & S2 heard, RRR.  Gastrointestinal system: Abdomen is nondistended, soft and nontender.  Neuro: Alert and oriented. No focal neurological deficits. Extremities: Symmetric, expected ROM Skin: No rashes, lesions Psychiatry: Demonstrates appropriate judgement and insight. Mood & affect appropriate for situation.   Assessment & Plan:  Principal Problem:   Asthma exacerbation Active Problems:   Asthma, chronic, unspecified asthma severity, with acute exacerbation   MDD (major  depressive disorder), recurrent severe, without psychosis (HCC)   Lower extremity edema   Elevated brain natriuretic peptide (BNP) level   Concern for new onset of CHF with exacerbation (congestive heart failure) (HCC)   Hypokalemia   History of essential hypertension   Elevated troponin   Continuous dependence on cigarette smoking   Shortness of breath    Asthma exacerbation - May be some COPD overlap - Respiratory viral panel negative - Chest x-ray without active disease - CTA negative for PE, evidence of reactive bronchitis/reactive airways - Continue steroids, tapering - s/p azithromycin - Continue nebulizers - Supplemental oxygen as needed  Subcostal and epigastric discomfort - Chest x-ray without acute changes - Pleuritic chest pain and epigastric tenderness continue with IV Toradol - As needed Tums  Type 2 diabetes, uncontrolled - Hemoglobin A1c 8.5% - Not taking any medications outpatient - Hyperglycemia is exacerbated by steroids - Continue diabetic diet - Sliding scale insulin - Metformin at discharge - Needs close outpatient follow-up with PCP, would benefit from additional oral agents  Lower extremity edema Heart failure preserved EF exacerbation - BNP on arrival 402 - TTE LVEF 50 to 55%, diastolic dysfunction possible - Status post IV Lasix, continue p.o. Lasix for now  Elevated troponin - Mild troponin elevation thought to be nonischemic.  No regional wall motion abnormality seen on TTE  Hypertension - Has not been taking medications outpatient - Currently on Norvasc, losartan, and Lasix.  Will transition HCTZ with Norvasc at discharge - [facility] consult for medication  assistance  Hypokalemia - Replace as needed  Major depressive disorder - No medications outpatient, patient reports depression is in remission - Outpatient follow-up  Tobacco abuse - Current 1 pack a day smoker, has been counseled on cessation - High-dose nicotine patch ordered,  continue  Cocaine abuse - Complicates the above - Patient has been counseled on cessation  DVT prophylaxis: Pt has been refusing lovenox. Add SCDs   Code Status: Full Code Family Communication:  Discussed directly with patient Disposition:  Inpatient still hospitalized for intermittent hypoxia requiring 2L, will discharge to home when respiratory status stabilized.   Consultants:    Procedures:    Antimicrobials:  Anti-infectives (From admission, onward)    Start     Dose/Rate Route Frequency Ordered Stop   08/23/23 1000  azithromycin (ZITHROMAX) tablet 500 mg        500 mg Oral Daily 08/22/23 0733 08/25/23 0959   08/22/23 0500  azithromycin (ZITHROMAX) 500 mg in sodium chloride 0.9 % 250 mL IVPB  Status:  Discontinued        500 mg 250 mL/hr over 60 Minutes Intravenous Every 24 hours 08/22/23 0422 08/22/23 0733       Data Reviewed: I have personally reviewed following labs and imaging studies CBC: Recent Labs  Lab 08/21/23 1032 08/22/23 0105 08/22/23 0414 08/23/23 0644 08/24/23 0046  WBC 9.3 10.8* 10.7* 8.3 5.8  NEUTROABS 7.5 9.5*  --   --   --   HGB 13.7 13.1 13.1 15.1* 16.1*  HCT 43.2 41.4 41.4 47.0* 49.5*  MCV 85.4 84.8 84.7 83.5 83.1  PLT 197 200 189 193 182   Basic Metabolic Panel: Recent Labs  Lab 08/21/23 0858 08/22/23 0105 08/22/23 0414 08/23/23 0644 08/24/23 0046  NA 143 142 141 138 138  K 4.5 3.4* 3.6 3.6 3.8  CL 111 107 106 104 103  CO2 18* 21* 21* 27 24  GLUCOSE 194* 203* 275* 210* 199*  BUN 7 6 6 15 18   CREATININE 0.61 0.74 0.79 0.70 0.80  CALCIUM 9.9 10.5* 10.2 10.5* 10.2  MG  --   --   --   --  1.6*   GFR: Estimated Creatinine Clearance: 115.7 mL/min (by C-G formula based on SCr of 0.8 mg/dL). Liver Function Tests: Recent Labs  Lab 08/22/23 0105 08/22/23 0414  AST 22 24  ALT 25 23  ALKPHOS 52 51  BILITOT 0.7 0.8  PROT 7.2 7.2  ALBUMIN 3.7 3.7   CBG: Recent Labs  Lab 08/22/23 1639 08/22/23 2053 08/23/23 0843  08/23/23 1625 08/23/23 2138  GLUCAP 189* 268* 218* 279* 155*    Recent Results (from the past 240 hours)  Resp panel by RT-PCR (RSV, Flu A&B, Covid) Anterior Nasal Swab     Status: None   Collection Time: 08/21/23  8:20 AM   Specimen: Anterior Nasal Swab  Result Value Ref Range Status   SARS Coronavirus 2 by RT PCR NEGATIVE NEGATIVE Final   Influenza A by PCR NEGATIVE NEGATIVE Final   Influenza B by PCR NEGATIVE NEGATIVE Final    Comment: (NOTE) The Xpert Xpress SARS-CoV-2/FLU/RSV plus assay is intended as an aid in the diagnosis of influenza from Nasopharyngeal swab specimens and should not be used as a sole basis for treatment. Nasal washings and aspirates are unacceptable for Xpert Xpress SARS-CoV-2/FLU/RSV testing.  Fact Sheet for Patients: BloggerCourse.com  Fact Sheet for Healthcare Providers: SeriousBroker.it  This test is not yet approved or cleared by the [place] and has been authorized  for detection and/or diagnosis of SARS-CoV-2 by FDA under an Emergency Use Authorization (EUA). This EUA will remain in effect (meaning this test can be used) for the duration of the COVID-19 declaration under Section 564(b)(1) of the Act, 21 U.S.C. section 360bbb-3(b)(1), unless the authorization is terminated or revoked.     Resp Syncytial Virus by PCR NEGATIVE NEGATIVE Final    Comment: (NOTE) Fact Sheet for Patients: BloggerCourse.com  Fact Sheet for Healthcare Providers: SeriousBroker.it  This test is not yet approved or cleared by the [place] FDA and has been authorized for detection and/or diagnosis of SARS-CoV-2 by FDA under an Emergency Use Authorization (EUA). This EUA will remain in effect (meaning this test can be used) for the duration of the COVID-19 declaration under Section 564(b)(1) of the Act, 21 U.S.C. section 360bbb-3(b)(1), unless the  authorization is terminated or revoked.  Performed at [facility], [address], [place], [place] [address]   Resp panel by RT-PCR (RSV, Flu A&B, Covid) Anterior Nasal Swab     Status: None   Collection Time: 08/22/23  1:05 AM   Specimen: Anterior Nasal Swab  Result Value Ref Range Status   SARS Coronavirus 2 by RT PCR NEGATIVE NEGATIVE Final   Influenza A by PCR NEGATIVE NEGATIVE Final   Influenza B by PCR NEGATIVE NEGATIVE Final    Comment: (NOTE) The Xpert Xpress SARS-CoV-2/FLU/RSV plus assay is intended as an aid in the diagnosis of influenza from Nasopharyngeal swab specimens and should not be used as a sole basis for treatment. Nasal washings and aspirates are unacceptable for Xpert Xpress SARS-CoV-2/FLU/RSV testing.  Fact Sheet for Patients: BloggerCourse.com  Fact Sheet for Healthcare Providers: SeriousBroker.it  This test is not yet approved or cleared by the [place] FDA and has been authorized for detection and/or diagnosis of SARS-CoV-2 by FDA under an Emergency Use Authorization (EUA). This EUA will remain in effect (meaning this test can be used) for the duration of the COVID-19 declaration under Section 564(b)(1) of the Act, 21 U.S.C. section 360bbb-3(b)(1), unless the authorization is terminated or revoked.     Resp Syncytial Virus by PCR NEGATIVE NEGATIVE Final    Comment: (NOTE) Fact Sheet for Patients: BloggerCourse.com  Fact Sheet for Healthcare Providers: SeriousBroker.it  This test is not yet approved or cleared by the [place] FDA and has been authorized for detection and/or diagnosis of SARS-CoV-2 by FDA under an Emergency Use Authorization (EUA). This EUA will remain in effect (meaning this test can be used) for the duration of the COVID-19 declaration under Section 564(b)(1) of the Act, 21 U.S.C. section 360bbb-3(b)(1), unless  the authorization is terminated or revoked.  Performed at [facility], [address] [place], [place] [address]      Radiology Studies: DG Chest 2 View Result Date: 08/23/2023 CLINICAL DATA:  [id number] Chest pain [id number] EXAM: CHEST - 2 VIEW COMPARISON:  08/22/2023 FINDINGS: Lungs are clear.  No pneumothorax. Heart size and mediastinal contours are within normal limits. No effusion. Visualized bones unremarkable. IMPRESSION: No acute cardiopulmonary disease. Electronically Signed   By: Corlis Leak M.D.   On: 08/23/2023 14:09   ECHOCARDIOGRAM COMPLETE Result Date: 08/22/2023    ECHOCARDIOGRAM REPORT   Patient Name:   CHIDERA DEARCOS Date of Exam: 08/22/2023 Medical Rec #:  [id number]        Height:       70.0 in Accession #:    [id number]       Weight:  265.0 lb Date of Birth:  [birth year]         BSA:          2.353 m Patient Age:    47 years         BP:           155/92 mmHg Patient Gender: F                HR:           108 bpm. Exam Location:  Inpatient Procedure: 2D Echo, Color Doppler and Cardiac Doppler (Both Spectral and Color            Flow Doppler were utilized during procedure). Indications:    I50.21 Acute systolic (congestive) heart failure  History:        Patient has no prior history of Echocardiogram examinations.                 CHF; Risk Factors:Hypertension.  Sonographer:    Irving Burton Senior RDCS Referring Phys: [id number] SUBRINA SUNDIL  Sonographer Comments: Suboptimal parasternal window due to URI IMPRESSIONS  1. Left ventricular ejection fraction, by estimation, is 50 to 55%. The left ventricle has low normal function. The left ventricle has no regional wall motion abnormalities. There is mild concentric left ventricular hypertrophy. Indeterminate diastolic filling due to E-A fusion.  2. Right ventricular systolic function is normal. The right ventricular size is normal. Tricuspid regurgitation signal is inadequate for assessing PA pressure.  3. Left atrial size was mildly dilated.   4. The mitral valve is normal in structure. Trivial mitral valve regurgitation.  5. The aortic valve is tricuspid. There is mild calcification of the aortic valve. Aortic valve regurgitation is trivial.  6. The inferior vena cava is normal in size with <50% respiratory variability, suggesting right atrial pressure of 8 mmHg. FINDINGS  Left Ventricle: Left ventricular ejection fraction, by estimation, is 50 to 55%. The left ventricle has low normal function. The left ventricle has no regional wall motion abnormalities. The left ventricular internal cavity size was normal in size. There is mild concentric left ventricular hypertrophy. Indeterminate diastolic filling due to E-A fusion. Right Ventricle: The right ventricular size is normal. No increase in right ventricular wall thickness. Right ventricular systolic function is normal. Tricuspid regurgitation signal is inadequate for assessing PA pressure. Left Atrium: Left atrial size was mildly dilated. Right Atrium: Right atrial size was normal in size. Pericardium: There is no evidence of pericardial effusion. Mitral Valve: The mitral valve is normal in structure. Trivial mitral valve regurgitation. Tricuspid Valve: The tricuspid valve is normal in structure. Tricuspid valve regurgitation is trivial. Aortic Valve: The aortic valve is tricuspid. There is mild calcification of the aortic valve. Aortic valve regurgitation is trivial. Pulmonic Valve: The pulmonic valve was normal in structure. Pulmonic valve regurgitation is trivial. Aorta: The ascending aorta was not well visualized and the aortic root is normal in size and structure. Venous: The inferior vena cava is normal in size with less than 50% respiratory variability, suggesting right atrial pressure of 8 mmHg. IAS/Shunts: No atrial level shunt detected by color flow Doppler.  LEFT VENTRICLE PLAX 2D LVIDd:         5.00 cm LVIDs:         3.50 cm LV PW:         1.20 cm LV IVS:        1.10 cm LVOT diam:     2.30 cm  LV SV:  91 LV SV Index:   39 LVOT Area:     4.15 cm  LV Volumes (MOD) LV vol d, MOD A2C: 122.0 ml LV vol d, MOD A4C: 152.0 ml LV vol s, MOD A2C: 51.0 ml LV vol s, MOD A4C: 69.8 ml LV SV MOD A2C:     71.0 ml LV SV MOD A4C:     152.0 ml LV SV MOD BP:      75.4 ml RIGHT VENTRICLE RV S prime:     11.70 cm/s TAPSE (M-mode): 1.9 cm LEFT ATRIUM             Index        RIGHT ATRIUM           Index LA diam:        3.90 cm 1.66 cm/m   RA Area:     17.80 cm LA Vol (A2C):   69.9 ml 29.71 ml/m  RA Volume:   56.60 ml  24.06 ml/m LA Vol (A4C):   86.5 ml 36.77 ml/m LA Biplane Vol: 80.3 ml 34.13 ml/m  AORTIC VALVE LVOT Vmax:   112.00 cm/s LVOT Vmean:  88.000 cm/s LVOT VTI:    0.218 m  AORTA Ao Root diam: 3.00 cm  SHUNTS Systemic VTI:  0.22 m Systemic Diam: 2.30 cm Arvilla Meres MD Electronically signed by Arvilla Meres MD Signature Date/Time: 08/22/2023/1:32:24 PM    Final     Scheduled Meds:  amLODipine  5 mg Oral Daily   azithromycin  500 mg Oral Daily   enoxaparin (LOVENOX) injection  60 mg Subcutaneous Q24H   furosemide  40 mg Oral Daily   guaiFENesin  600 mg Oral BID   insulin aspart  0-9 Units Subcutaneous TID WC   losartan  25 mg Oral Daily   metoprolol tartrate  25 mg Oral BID   nicotine  21 mg Transdermal Daily   pantoprazole  40 mg Oral Daily   predniSONE  40 mg Oral Q breakfast   sodium chloride flush  3 mL Intravenous Q12H   Continuous Infusions:   LOS: 2 days    Total time spent interpreting labs and vitals, coordinating care amongst consultants and care team members, directly assessing and discussing care with the patient and/or family: 55 min   Debarah Crape, DO [facility]  To contact the attending physician between 7A-7P please use Epic Chat. To contact the covering physician during after hours 7P-7A, please review Amion.   08/24/2023, 9:25 AM   *This document has been created with the assistance of dictation software. Please excuse typographical errors.  *

## 2023-08-24 NOTE — Progress Notes (Signed)
 Patient called out to nurses station complaining of 10/10 chest pain. MD made aware, see new orders. Repeat EKG performed. HR elevated at 136. Nitroglycerin and Metoprolol given as ordered. Patient reported slight relief and denies any other symptoms at this time.

## 2023-08-25 ENCOUNTER — Encounter (HOSPITAL_COMMUNITY): Payer: Self-pay

## 2023-08-25 ENCOUNTER — Other Ambulatory Visit: Payer: Self-pay

## 2023-08-25 ENCOUNTER — Other Ambulatory Visit (HOSPITAL_COMMUNITY): Payer: Self-pay

## 2023-08-25 ENCOUNTER — Emergency Department (HOSPITAL_COMMUNITY)
Admission: EM | Admit: 2023-08-25 | Discharge: 2023-08-25 | Disposition: A | Discharge status: Home / Self Care | Payer: MEDICAID | Attending: Emergency Medicine | Admitting: Emergency Medicine

## 2023-08-25 ENCOUNTER — Emergency Department (HOSPITAL_COMMUNITY): Payer: MEDICAID

## 2023-08-25 DIAGNOSIS — E119 Type 2 diabetes mellitus without complications: Secondary | ICD-10-CM | POA: Diagnosis not present

## 2023-08-25 DIAGNOSIS — Z7984 Long term (current) use of oral hypoglycemic drugs: Secondary | ICD-10-CM | POA: Insufficient documentation

## 2023-08-25 DIAGNOSIS — J4541 Moderate persistent asthma with (acute) exacerbation: Secondary | ICD-10-CM

## 2023-08-25 DIAGNOSIS — J4531 Mild persistent asthma with (acute) exacerbation: Secondary | ICD-10-CM | POA: Insufficient documentation

## 2023-08-25 DIAGNOSIS — I110 Hypertensive heart disease with heart failure: Secondary | ICD-10-CM | POA: Diagnosis not present

## 2023-08-25 DIAGNOSIS — R0602 Shortness of breath: Secondary | ICD-10-CM | POA: Diagnosis present

## 2023-08-25 DIAGNOSIS — R7989 Other specified abnormal findings of blood chemistry: Secondary | ICD-10-CM | POA: Diagnosis not present

## 2023-08-25 DIAGNOSIS — I509 Heart failure, unspecified: Secondary | ICD-10-CM | POA: Insufficient documentation

## 2023-08-25 DIAGNOSIS — I10 Essential (primary) hypertension: Secondary | ICD-10-CM | POA: Diagnosis not present

## 2023-08-25 DIAGNOSIS — Z7951 Long term (current) use of inhaled steroids: Secondary | ICD-10-CM | POA: Diagnosis not present

## 2023-08-25 DIAGNOSIS — Z79899 Other long term (current) drug therapy: Secondary | ICD-10-CM | POA: Insufficient documentation

## 2023-08-25 DIAGNOSIS — F17210 Nicotine dependence, cigarettes, uncomplicated: Secondary | ICD-10-CM | POA: Insufficient documentation

## 2023-08-25 DIAGNOSIS — J45901 Unspecified asthma with (acute) exacerbation: Secondary | ICD-10-CM | POA: Diagnosis not present

## 2023-08-25 LAB — BASIC METABOLIC PANEL
Anion gap: 6 (ref 5–15)
Anion gap: 15 (ref 5–15)
BUN: 14 mg/dL (ref 6–20)
BUN: 19 mg/dL (ref 6–20)
CO2: 26 mmol/L (ref 22–32)
CO2: 21 mmol/L — ABNORMAL LOW (ref 22–32)
Calcium: 9.5 mg/dL (ref 8.9–10.3)
Calcium: 10.1 mg/dL (ref 8.9–10.3)
Chloride: 104 mmol/L (ref 98–111)
Chloride: 106 mmol/L (ref 98–111)
Creatinine, Ser: 0.77 mg/dL (ref 0.44–1.00)
Creatinine, Ser: 0.9 mg/dL (ref 0.44–1.00)
GFR, Estimated: >60 mL/min (ref >60)
GFR, Estimated: >60 mL/min (ref >60)
Glucose, Bld: 246 mg/dL — ABNORMAL HIGH (ref 70–99)
Glucose, Bld: 317 mg/dL — ABNORMAL HIGH (ref 70–99)
Potassium: 4 mmol/L (ref 3.5–5.1)
Potassium: 4.3 mmol/L (ref 3.5–5.1)
Sodium: 136 mmol/L (ref 135–145)
Sodium: 142 mmol/L (ref 135–145)

## 2023-08-25 LAB — CBC WITH DIFFERENTIAL/PLATELET
Abs Immature Granulocytes: 0 10*3/uL (ref 0.00–0.07)
Basophils Absolute: 0 10*3/uL (ref 0.0–0.1)
Basophils Relative: 0 %
Eosinophils Absolute: 0 10*3/uL (ref 0.0–0.5)
Eosinophils Relative: 0 %
HCT: 47.7 % — ABNORMAL HIGH (ref 36.0–46.0)
Hemoglobin: 14.8 g/dL (ref 12.0–15.0)
Lymphocytes Relative: 27 %
Lymphs Abs: 1.9 10*3/uL (ref 0.7–4.0)
MCH: 26.4 pg (ref 26.0–34.0)
MCHC: 31 g/dL (ref 30.0–36.0)
MCV: 85.2 fL (ref 80.0–100.0)
Monocytes Absolute: 0.7 10*3/uL (ref 0.1–1.0)
Monocytes Relative: 10 %
Neutro Abs: 4.4 10*3/uL (ref 1.7–7.7)
Neutrophils Relative %: 63 %
Platelets: 194 10*3/uL (ref 150–400)
RBC: 5.6 MIL/uL — ABNORMAL HIGH (ref 3.87–5.11)
RDW: 14.1 % (ref 11.5–15.5)
WBC: 7 10*3/uL (ref 4.0–10.5)
nRBC: 0 % (ref 0.0–0.2)
nRBC: 0 /100{WBCs}

## 2023-08-25 LAB — CBC
HCT: 49.2 % — ABNORMAL HIGH (ref 36.0–46.0)
Hemoglobin: 15.8 g/dL — ABNORMAL HIGH (ref 12.0–15.0)
MCH: 26.8 pg (ref 26.0–34.0)
MCHC: 32.1 g/dL (ref 30.0–36.0)
MCV: 83.5 fL (ref 80.0–100.0)
Platelets: 174 10*3/uL (ref 150–400)
RBC: 5.89 MIL/uL — ABNORMAL HIGH (ref 3.87–5.11)
RDW: 14.5 % (ref 11.5–15.5)
WBC: 4.9 10*3/uL (ref 4.0–10.5)
nRBC: 0 % (ref 0.0–0.2)

## 2023-08-25 LAB — BRAIN NATRIURETIC PEPTIDE: B Natriuretic Peptide: 36.3 pg/mL (ref 0.0–100.0)

## 2023-08-25 LAB — GLUCOSE, CAPILLARY
Glucose-Capillary: 225 mg/dL — ABNORMAL HIGH (ref 70–99)
Glucose-Capillary: 262 mg/dL — ABNORMAL HIGH (ref 70–99)

## 2023-08-25 LAB — HIV ANTIBODY (ROUTINE TESTING W REFLEX): HIV Screen 4th Generation wRfx: NONREACTIVE

## 2023-08-25 MED ORDER — METOPROLOL TARTRATE 25 MG PO TABS
25.0000 mg | ORAL_TABLET | Freq: Two times a day (BID) | ORAL | 0 refills | Status: DC
Start: 2023-08-25 — End: 2023-09-23
  Filled 2023-08-25: qty 180, 90d supply, fill #0

## 2023-08-25 MED ORDER — MAGNESIUM SULFATE 2 GM/50ML IV SOLN
2.0000 g | Freq: Once | INTRAVENOUS | Status: AC
  Administered 2023-08-25: 2 g via INTRAVENOUS
  Filled 2023-08-25: qty 50

## 2023-08-25 MED ORDER — SODIUM CHLORIDE 0.9 % IV BOLUS
1000.0000 mL | Freq: Once | INTRAVENOUS | Status: AC
  Administered 2023-08-25: 1000 mL via INTRAVENOUS

## 2023-08-25 MED ORDER — LOSARTAN POTASSIUM 50 MG PO TABS
50.0000 mg | ORAL_TABLET | Freq: Every day | ORAL | 0 refills | Status: DC
Start: 2023-08-26 — End: 2023-09-27
  Filled 2023-08-25: qty 90, 90d supply, fill #0

## 2023-08-25 MED ORDER — METHYLPREDNISOLONE SODIUM SUCC 125 MG IJ SOLR
62.5000 mg | Freq: Once | INTRAMUSCULAR | Status: AC
  Administered 2023-08-25: 62.5 mg via INTRAVENOUS
  Filled 2023-08-25: qty 2

## 2023-08-25 MED ORDER — IPRATROPIUM-ALBUTEROL 0.5-2.5 (3) MG/3ML IN SOLN
3.0000 mL | RESPIRATORY_TRACT | Status: AC
Start: 2023-08-25 — End: 2023-08-25
  Administered 2023-08-25 (×3): 3 mL via RESPIRATORY_TRACT
  Filled 2023-08-25: qty 9

## 2023-08-25 MED ORDER — PANTOPRAZOLE SODIUM 40 MG PO TBEC
40.0000 mg | DELAYED_RELEASE_TABLET | Freq: Every day | ORAL | 0 refills | Status: DC
  Filled 2023-08-25: qty 30, 30d supply, fill #0

## 2023-08-25 MED ORDER — ALBUTEROL SULFATE HFA 108 (90 BASE) MCG/ACT IN AERS
2.0000 | INHALATION_SPRAY | RESPIRATORY_TRACT | Status: DC | PRN
  Administered 2023-08-25: 2 via RESPIRATORY_TRACT
  Filled 2023-08-25: qty 6.7

## 2023-08-25 MED ORDER — METFORMIN HCL 500 MG PO TABS
500.0000 mg | ORAL_TABLET | Freq: Two times a day (BID) | ORAL | 0 refills | Status: DC
  Filled 2023-08-25: qty 60, 30d supply, fill #0

## 2023-08-25 MED ORDER — ALBUTEROL SULFATE HFA 108 (90 BASE) MCG/ACT IN AERS
2.0000 | INHALATION_SPRAY | Freq: Four times a day (QID) | RESPIRATORY_TRACT | 2 refills | Status: DC | PRN
  Filled 2023-08-25: qty 18, 25d supply, fill #0

## 2023-08-25 MED ORDER — ENOXAPARIN SODIUM 60 MG/0.6ML IJ SOSY
50.0000 mg | PREFILLED_SYRINGE | INTRAMUSCULAR | Status: DC
Start: 2023-08-26 — End: 2023-08-25

## 2023-08-25 MED ORDER — PREDNISONE 20 MG PO TABS
ORAL_TABLET | ORAL | 0 refills | Status: AC
  Filled 2023-08-25: qty 7, 6d supply, fill #0

## 2023-08-25 NOTE — ED Notes (Signed)
 Called and placed PT on monitor with CCMD.

## 2023-08-25 NOTE — Discharge Instructions (Addendum)
 It was a pleasure caring for you today in the emergency department.  Please return to the emergency department for any worsening or worrisome symptoms.   The results of your HIV testing will be available on MyChart in the next couple days.    RESOURCE GUIDE  Chronic Pain Problems: Contact [facility]  [phone] Patients need to be referred by their primary care doctor.  Insufficient Money for Medicine: Contact [employer] Way:  call [phone]  No Primary Care Doctor: Call [facility]  [phone] - can help you locate a primary care doctor that  accepts your insurance, provides certain services, etc. Physician Referral Service- [phone]  Agencies that provide inexpensive medical care: [facility]  [phone] [facility]  [phone] [facility]  [phone] [facility]  [phone] [facility]  [phone] [facility]  [phone]  Medicaid-accepting [facility] Providers: [facility]- [address]  [phone], Mon-Fri 9am-7pm, Sat 9am-1pm [facility]- [address] [place], Suite [place]  [phone] [facility]- [address] [place]  [phone] [facility]- [address]  [phone] Renaye Rakers- [address] [place], [address], [phone]  Only accepts [place] patients after they have their name  applied to their card  Self Pay (no insurance) in [facility]: Sickle Cell Patients - [facility]  [address] [place], [phone] [facility]- [address] [place]  [phone]       [facility] [place]- [address]       -     [facility]- see information above (Speak to [employer] if you do not have insurance)       -  [facility]- [address],  [phone]       -  [facility]- [address],  [phone]       -  Dr Julio Sicks-  [address], [place], [phone]       -  [facility] - [address] [phone]       -  [facility]- [address], [phone], also [address] [phone]       -     [facility]- [address] [place], [phone], 1st & 3rd Saturday         every month, 10am-1pm  -     [facility]   [address] [place], [place].   Phone:  [phone], Fax:  [phone]. Hours of Operation:  9 am - 6 pm, M-F.  -     [facility]   [address], [place]   Phone: [phone], Fax: [phone]. Hours of Operation:  8:30 am - 5:30 pm, M-F.    Dental Assistance If unable to pay or uninsured, contact:  [facility]. to become qualified for the adult dental clinic.  Patients with Medicaid: [facility] [phone] [address], [phone] [address], [phone]  If unable to pay, or uninsured, contact [facility] [phone] in [place], [phone] in [facility]) to become qualified for the adult dental clinic  [facility] [address] [place], [place] [address] [phone] [email or web address]  Other  Low-Cost [employer] Services: [facility]- [address] [place], [place], [place], [address], [phone], Ext. [phone], 2nd and 4th Thursday of the month at 6:30am.  10 clients each day by appointment, can sometimes see walk-in patients if someone does not show for an appointment. [facility]- [address] [place], [place], [address], [phone] [facility] [address] [place], [place], [address], [phone] [facility]- [phone] [facility]- [phone] [facility] [phone]

## 2023-08-25 NOTE — ED Notes (Signed)
PT refused covid swab.

## 2023-08-25 NOTE — Inpatient Diabetes Management (Signed)
 Inpatient Diabetes Program Recommendations  AACE/ADA: New Consensus Statement on Inpatient Glycemic Control (2015)  Target Ranges:  Prepandial:   less than 140 mg/dL      Peak postprandial:   less than 180 mg/dL (1-2 hours)      Critically ill patients:  140 - 180 mg/dL   Lab Results  Component Value Date   GLUCAP 262 (H) 08/25/2023   HGBA1C 8.5 (H) 08/22/2023    Review of Glycemic Control  Latest Reference Range & Units 08/24/23 12:58 08/24/23 17:52 08/24/23 21:04 08/25/23 08:34 08/25/23 11:45  Glucose-Capillary 70 - 99 mg/dL 401 (H) 027 (H) 253 (H) 225 (H) 262 (H)  (H): Data is abnormally high Diabetes history: DM 2 Outpatient Diabetes medications:  None Current orders for Inpatient glycemic control:  Novolog 0-9 units tid with meals  Solumedrol 40 mg IV q 12 hours, now tapered to Prednisone 40 mg QD   Inpatient Diabetes Program Recommendations:  . Trends continue to exceed inpatient goals.  While on steroids, consider adding Semglee 12 units every day and adding Novolog 3 units TID (assuming patient is consuming >50% of meals)  Thanks, Lujean Rave, MSN, RNC-OB Diabetes Coordinator [phone] (8a-5p)

## 2023-08-25 NOTE — ED Notes (Signed)
 Pt placed on 2L of O2 per MD Wallace Cullens

## 2023-08-25 NOTE — Plan of Care (Signed)

## 2023-08-25 NOTE — ED Triage Notes (Addendum)
 Pt was DC today for asthma, SOB. By the time pt got to pharmacy it was closed and she was unable to get meds. Pt has more SOB has increased throughout the day. Also c/o chest tightness. Pt has no asthma medications at home. Chest tightness is worse with cough. CP is central and only happens when coughing. Pt having trouble with inspiration. Feels like she can't get a full breath

## 2023-08-25 NOTE — Plan of Care (Signed)
 Patient AAOx4. Telemetry monitoring in progress. No complaints of pain. LPIV removed. Safety precautions maintained.   Discharge instructions discussed with patient, verbalized understanding. Left with all belongings in no acute or respiratory distress.   Problem: Coping: Goal: Ability to adjust to condition or change in health will improve Outcome: Progressing   Problem: Skin Integrity: Goal: Risk for impaired skin integrity will decrease Outcome: Progressing   Problem: Clinical Measurements: Goal: Ability to maintain clinical measurements within normal limits will improve Outcome: Progressing   Problem: Activity: Goal: Risk for activity intolerance will decrease Outcome: Progressing

## 2023-08-25 NOTE — Discharge Summary (Signed)
 Physician Discharge Summary   Patient: Julie [name] MRN: [id number] DOB: April 12, 1976  Admit date:     08/21/2023  Discharge date: 08/25/23  Discharge Physician: Debarah Crape   PCP: Pcp, No   Recommendations at discharge:   Closely with primary care for diabetes management and blood pressure management.  Discharge Diagnoses: Principal Problem:   Asthma exacerbation Active Problems:   Asthma, chronic, unspecified asthma severity, with acute exacerbation   MDD (major depressive disorder), recurrent severe, without psychosis (HCC)   Lower extremity edema   Elevated brain natriuretic peptide (BNP) level   Concern for new onset of CHF with exacerbation (congestive heart failure) (HCC)   Hypokalemia   History of essential hypertension   Elevated troponin   Continuous dependence on cigarette smoking   Shortness of breath  Resolved Problems:   * No resolved hospital problems. [facility] Course: Julie [name] is a 48 year old female with history of asthma, depression, cocaine abuse, heart failure with preserved EF, hypertension, medication nonadherence, diabetes, presents the ED complaining of shortness of breath and cough.  She was diagnosed with an asthma exacerbation and admitted.  Patient does have a tobacco abuse history and there is concern that there may be some COPD overlap, though she denies an official diagnosis.  Her respiratory viral panel was negative.  Chest x-ray was without active disease.  CTA was performed which revealed evidence of reactive bronchitis but negative for PE.  She was started on steroids, nebulizers, and supplemental oxygen.  She did also receive azithromycin.  Gradually her breathing improved, and she was able to wean entirely off of oxygen. Her stay was further complicated by complaints of chest pain.  Troponins had downward trend and chest pain resolved on its own.  Costochondritic pain was treated with IV Toradol. By reevaluation on 3/6 patient was  breathing easily on room air.  She reported feeling ready for discharge home.  Further care as below.  Asthma exacerbation - May be some COPD overlap given tobacco abuse - Respiratory viral panel negative - Chest x-ray without active disease - CTA negative for PE, evidence of reactive bronchitis/reactive airways - Continue steroids, tapering - s/p azithromycin -- On room air -- Albuterol PRN at home  Atypical Chest pain Subcostal and epigastric discomfort - Chest x-ray without acute changes - Pleuritic chest pain and epigastric tenderness resolved with NSAIDs -- Trop repeat lower than previous - As needed Tums   Type 2 diabetes, uncontrolled - Hemoglobin A1c 8.5% - Not taking any medications outpatient. Start metformin, will need increase outpt when tolerating - Hyperglycemia is exacerbated by steroids - Continue diabetic diet - Needs close outpatient follow-up with PCP, would benefit from additional oral agents   Lower extremity edema Heart failure preserved EF exacerbation - BNP on arrival 402 - TTE LVEF 50 to 55%, diastolic dysfunction possible - Status post IV Lasix, euvolemic now.   Elevated troponin - Mild troponin elevation thought to be nonischemic.  No regional wall motion abnormality seen on TTE. Chest pain resolved without intervention   Hypertension - Has not been taking medications outpatient - Currently on Norvasc, losartan, and Lasix. Optimize losartan, DC amlodipine. -- Needs close outpt follow up - TOC consult for medication assistance   Hypokalemia - Replace as needed   Major depressive disorder - No medications outpatient, patient reports depression is in remission - Outpatient follow-up   Tobacco abuse - Current 1 pack a day smoker, has been counseled on cessation - High-dose nicotine patch while admitted  Cocaine abuse - Complicates the above - Patient has been counseled on cessation         Consultants: N/a Procedures performed:  n/a  Disposition: Home Diet recommendation:  Discharge Diet Orders (From admission, onward)     Start     Ordered   08/25/23 0000  Diet Carb Modified        08/25/23 1258           Carb modified diet DISCHARGE MEDICATION: Allergies as of 08/25/2023   No Known Allergies      Medication List     STOP taking these medications    amLODipine 5 MG tablet Commonly known as: NORVASC   ibuprofen 200 MG tablet Commonly known as: ADVIL   naproxen 500 MG tablet Commonly known as: NAPROSYN       TAKE these medications    albuterol 108 (90 Base) MCG/ACT inhaler Commonly known as: VENTOLIN HFA Inhale 2 puffs into the lungs every 6 (six) hours as needed for wheezing or shortness of breath.   losartan 50 MG tablet Commonly known as: COZAAR Take 1 tablet (50 mg total) by mouth daily. Start taking on: August 26, 2023   metFORMIN 500 MG tablet Commonly known as: GLUCOPHAGE Take 1 tablet (500 mg total) by mouth 2 (two) times daily with a meal.   metoprolol tartrate 25 MG tablet Commonly known as: LOPRESSOR Take 1 tablet (25 mg total) by mouth 2 (two) times daily.   pantoprazole 40 MG tablet Commonly known as: PROTONIX Take 1 tablet (40 mg total) by mouth daily. Start taking on: August 26, 2023   predniSONE 20 MG tablet Commonly known as: DELTASONE Take 2 tablets (40 mg total) by mouth daily with breakfast for 2 days, THEN 1 tablet (20 mg total) daily with breakfast for 2 days, THEN 0.5 tablets (10 mg total) daily with breakfast for 2 days. Start taking on: August 25, 2023 What changed:  medication strength See the new instructions.        Discharge Exam: Filed Weights   08/23/23 0500 08/24/23 0500  Weight: 105.3 kg 110.2 kg   Constitutional:  Normal appearance. Non toxic-appearing.  HENT: Head Normocephalic and atraumatic.  Mucous membranes are moist.  Eyes:  Extraocular intact. Conjunctivae normal. Pupils are equal, round, and reactive to light.  Cardiovascular:  Rate and Rhythm: Normal rate and regular rhythm.  Pulmonary: Non labored, symmetric rise of chest wall. No wheeze, no supplemental O2. Musculoskeletal:  Normal range of motion.  Skin: warm and dry. not jaundiced.  Neurological: No focal deficit present. alert. Oriented. Psychiatric: Mood and Affect congruent.    Condition at discharge: stable  The results of significant diagnostics from this hospitalization (including imaging, microbiology, ancillary and laboratory) are listed below for reference.   Imaging Studies: DG Chest 2 View Result Date: 08/23/2023 CLINICAL DATA:  [id number] Chest pain [id number] EXAM: CHEST - 2 VIEW COMPARISON:  08/22/2023 FINDINGS: Lungs are clear.  No pneumothorax. Heart size and mediastinal contours are within normal limits. No effusion. Visualized bones unremarkable. IMPRESSION: No acute cardiopulmonary disease. Electronically Signed   By: Corlis Leak M.D.   On: 08/23/2023 14:09   ECHOCARDIOGRAM COMPLETE Result Date: 08/22/2023    ECHOCARDIOGRAM REPORT   Patient Name:   ABIHA LUKEHART Date of Exam: 08/22/2023 Medical Rec #:  [id number]        Height:       70.0 in Accession #:    [id number]       Weight:  265.0 lb Date of Birth:  Jun 21, 1976         BSA:          2.353 m Patient Age:    47 years         BP:           155/92 mmHg Patient Gender: F                HR:           108 bpm. Exam Location:  Inpatient Procedure: 2D Echo, Color Doppler and Cardiac Doppler (Both Spectral and Color            Flow Doppler were utilized during procedure). Indications:    I50.21 Acute systolic (congestive) heart failure  History:        Patient has no prior history of Echocardiogram examinations.                 CHF; Risk Factors:Hypertension.  Sonographer:    Irving Burton Senior RDCS Referring Phys: [id number] SUBRINA SUNDIL  Sonographer Comments: Suboptimal parasternal window due to URI IMPRESSIONS  1. Left ventricular ejection fraction, by estimation, is 50 to 55%. The left ventricle has low normal  function. The left ventricle has no regional wall motion abnormalities. There is mild concentric left ventricular hypertrophy. Indeterminate diastolic filling due to E-A fusion.  2. Right ventricular systolic function is normal. The right ventricular size is normal. Tricuspid regurgitation signal is inadequate for assessing PA pressure.  3. Left atrial size was mildly dilated.  4. The mitral valve is normal in structure. Trivial mitral valve regurgitation.  5. The aortic valve is tricuspid. There is mild calcification of the aortic valve. Aortic valve regurgitation is trivial.  6. The inferior vena cava is normal in size with <50% respiratory variability, suggesting right atrial pressure of 8 mmHg. FINDINGS  Left Ventricle: Left ventricular ejection fraction, by estimation, is 50 to 55%. The left ventricle has low normal function. The left ventricle has no regional wall motion abnormalities. The left ventricular internal cavity size was normal in size. There is mild concentric left ventricular hypertrophy. Indeterminate diastolic filling due to E-A fusion. Right Ventricle: The right ventricular size is normal. No increase in right ventricular wall thickness. Right ventricular systolic function is normal. Tricuspid regurgitation signal is inadequate for assessing PA pressure. Left Atrium: Left atrial size was mildly dilated. Right Atrium: Right atrial size was normal in size. Pericardium: There is no evidence of pericardial effusion. Mitral Valve: The mitral valve is normal in structure. Trivial mitral valve regurgitation. Tricuspid Valve: The tricuspid valve is normal in structure. Tricuspid valve regurgitation is trivial. Aortic Valve: The aortic valve is tricuspid. There is mild calcification of the aortic valve. Aortic valve regurgitation is trivial. Pulmonic Valve: The pulmonic valve was normal in structure. Pulmonic valve regurgitation is trivial. Aorta: The ascending aorta was not well visualized and the  aortic root is normal in size and structure. Venous: The inferior vena cava is normal in size with less than 50% respiratory variability, suggesting right atrial pressure of 8 mmHg. IAS/Shunts: No atrial level shunt detected by color flow Doppler.  LEFT VENTRICLE PLAX 2D LVIDd:         5.00 cm LVIDs:         3.50 cm LV PW:         1.20 cm LV IVS:        1.10 cm LVOT diam:     2.30 cm LV SV:  91 LV SV Index:   39 LVOT Area:     4.15 cm  LV Volumes (MOD) LV vol d, MOD A2C: 122.0 ml LV vol d, MOD A4C: 152.0 ml LV vol s, MOD A2C: 51.0 ml LV vol s, MOD A4C: 69.8 ml LV SV MOD A2C:     71.0 ml LV SV MOD A4C:     152.0 ml LV SV MOD BP:      75.4 ml RIGHT VENTRICLE RV S prime:     11.70 cm/s TAPSE (M-mode): 1.9 cm LEFT ATRIUM             Index        RIGHT ATRIUM           Index LA diam:        3.90 cm 1.66 cm/m   RA Area:     17.80 cm LA Vol (A2C):   69.9 ml 29.71 ml/m  RA Volume:   56.60 ml  24.06 ml/m LA Vol (A4C):   86.5 ml 36.77 ml/m LA Biplane Vol: 80.3 ml 34.13 ml/m  AORTIC VALVE LVOT Vmax:   112.00 cm/s LVOT Vmean:  88.000 cm/s LVOT VTI:    0.218 m  AORTA Ao Root diam: 3.00 cm  SHUNTS Systemic VTI:  0.22 m Systemic Diam: 2.30 cm Arvilla Meres MD Electronically signed by Arvilla Meres MD Signature Date/Time: 08/22/2023/1:32:24 PM    Final    CT Angio Chest PE W and/or Wo Contrast Result Date: 08/22/2023 CLINICAL DATA:  Pulmonary embolism suspected. Shortness of breath and wheezing. EXAM: CT ANGIOGRAPHY CHEST WITH CONTRAST TECHNIQUE: Multidetector CT imaging of the chest was performed using the standard protocol during bolus administration of intravenous contrast. Multiplanar CT image reconstructions and MIPs were obtained to evaluate the vascular anatomy. RADIATION DOSE REDUCTION: This exam was performed according to the departmental dose-optimization program which includes automated exposure control, adjustment of the mA and/or kV according to patient size and/or use of iterative reconstruction  technique. CONTRAST:  75mL OMNIPAQUE IOHEXOL 350 MG/ML SOLN COMPARISON:  Same day chest radiograph FINDINGS: Cardiovascular: Borderline cardiomegaly. No pericardial effusion. Normal caliber thoracic aorta. Negative for acute pulmonary embolism. Mediastinum/Nodes: Unremarkable trachea and esophagus. Shotty mediastinal and hilar lymph nodes are favored reactive. Lungs/Pleura: No focal consolidation, pleural effusion, or pneumothorax. Mild diffuse bronchial wall thickening. Upper Abdomen: No acute abnormality. Musculoskeletal: 2.0 cm cystic lesion in the medial left upper chest favored to represent a sebaceous cyst. No acute fracture. Review of the MIP images confirms the above findings. IMPRESSION: 1. Negative for acute pulmonary embolism. 2. Mild diffuse bronchial wall thickening can be seen with bronchitis/reactive airways. Electronically Signed   By: Minerva Fester M.D.   On: 08/22/2023 03:17   DG Chest 2 View Result Date: 08/22/2023 CLINICAL DATA:  Shortness of breath and cough. EXAM: CHEST - 2 VIEW COMPARISON:  August 21, 2023 FINDINGS: The heart size and mediastinal contours are within normal limits. Both lungs are clear. The visualized skeletal structures are unremarkable. IMPRESSION: No active cardiopulmonary disease. Electronically Signed   By: Aram Candela M.D.   On: 08/22/2023 01:18   DG Chest Portable 1 View Result Date: 08/21/2023 CLINICAL DATA:  Shortness of breath EXAM: PORTABLE CHEST 1 VIEW COMPARISON:  02/27/2023 FINDINGS: The heart size and mediastinal contours are within normal limits. Both lungs are clear. The visualized skeletal structures are unremarkable. IMPRESSION: No active disease. Electronically Signed   By: Signa Kell M.D.   On: 08/21/2023 09:04    Microbiology: Results for  orders placed or performed during the hospital encounter of 08/21/23  Resp panel by RT-PCR (RSV, Flu A&B, Covid) Anterior Nasal Swab     Status: None   Collection Time: 08/22/23  1:05 AM   Specimen:  Anterior Nasal Swab  Result Value Ref Range Status   SARS Coronavirus 2 by RT PCR NEGATIVE NEGATIVE Final   Influenza A by PCR NEGATIVE NEGATIVE Final   Influenza B by PCR NEGATIVE NEGATIVE Final    Comment: (NOTE) The Xpert Xpress SARS-CoV-2/FLU/RSV plus assay is intended as an aid in the diagnosis of influenza from Nasopharyngeal swab specimens and should not be used as a sole basis for treatment. Nasal washings and aspirates are unacceptable for Xpert Xpress SARS-CoV-2/FLU/RSV testing.  Fact Sheet for Patients: BloggerCourse.com  Fact Sheet for Healthcare Providers: SeriousBroker.it  This test is not yet approved or cleared by the [place] FDA and has been authorized for detection and/or diagnosis of SARS-CoV-2 by FDA under an Emergency Use Authorization (EUA). This EUA will remain in effect (meaning this test can be used) for the duration of the COVID-19 declaration under Section 564(b)(1) of the Act, 21 U.S.C. section 360bbb-3(b)(1), unless the authorization is terminated or revoked.     Resp Syncytial Virus by PCR NEGATIVE NEGATIVE Final    Comment: (NOTE) Fact Sheet for Patients: BloggerCourse.com  Fact Sheet for Healthcare Providers: SeriousBroker.it  This test is not yet approved or cleared by the [place] FDA and has been authorized for detection and/or diagnosis of SARS-CoV-2 by FDA under an Emergency Use Authorization (EUA). This EUA will remain in effect (meaning this test can be used) for the duration of the COVID-19 declaration under Section 564(b)(1) of the Act, 21 U.S.C. section 360bbb-3(b)(1), unless the authorization is terminated or revoked.  Performed at [facility], [address] [place], [place] [address]     Labs: CBC: Recent Labs  Lab 08/21/23 1032 08/22/23 0105 08/22/23 0414 08/23/23 0644 08/24/23 0046 08/25/23 0721   WBC 9.3 10.8* 10.7* 8.3 5.8 4.9  NEUTROABS 7.5 9.5*  --   --   --   --   HGB 13.7 13.1 13.1 15.1* 16.1* 15.8*  HCT 43.2 41.4 41.4 47.0* 49.5* 49.2*  MCV 85.4 84.8 84.7 83.5 83.1 83.5  PLT 197 200 189 193 182 174   Basic Metabolic Panel: Recent Labs  Lab 08/22/23 0105 08/22/23 0414 08/23/23 0644 08/24/23 0046 08/25/23 0721  NA 142 141 138 138 136  K 3.4* 3.6 3.6 3.8 4.0  CL 107 106 104 103 104  CO2 21* 21* 27 24 26   GLUCOSE 203* 275* 210* 199* 246*  BUN 6 6 15 18 14   CREATININE 0.74 0.79 0.70 0.80 0.77  CALCIUM 10.5* 10.2 10.5* 10.2 9.5  MG  --   --   --  1.6*  --    Liver Function Tests: Recent Labs  Lab 08/22/23 0105 08/22/23 0414  AST 22 24  ALT 25 23  ALKPHOS 52 51  BILITOT 0.7 0.8  PROT 7.2 7.2  ALBUMIN 3.7 3.7   CBG: Recent Labs  Lab 08/24/23 1258 08/24/23 1752 08/24/23 2104 08/25/23 0834 08/25/23 1145  GLUCAP 280* 301* 251* 225* 262*    Discharge time spent: 31 minutes.  Signed: Debarah Crape, DO [facility] 08/25/2023

## 2023-08-25 NOTE — ED Provider Notes (Signed)
 [place] EMERGENCY DEPARTMENT AT [facility] Provider Note  CSN: [id number] Arrival date & time: 08/25/23 1804  Chief Complaint(s) Shortness of Breath  HPI Julie [name] is a 48 y.o. female with past medical history as below, significant for asthma, DM, CHF who presents to the ED with complaint of dyspnea  Patient was discharged earlier today, by the time she got at the pharmacy they were closed and she cannot get her albuterol inhaler.  Having worsening difficulty breathing past couple hours.  Feels that worsens at nighttime.  Difficulty coughing up her sputum but feels like it stuck in her chest.  She some chest tightness but no chest pain.  No fevers or chills, no nausea or vomiting.  Does not use home oxygen  Past Medical History Past Medical History:  Diagnosis Date   Asthma    Diabetes mellitus without complication (HCC)    Fracture, humerus closed, shaft 07/11/2014   left   History of bronchitis    Mental health problem    Open fracture of great toe of left foot 12/25/2015   Patient Active Problem List   Diagnosis Date Noted   Asthma, chronic, unspecified asthma severity, with acute exacerbation 08/22/2023   Lower extremity edema 08/22/2023   Elevated brain natriuretic peptide (BNP) level 08/22/2023   Concern for new onset of CHF with exacerbation (congestive heart failure) (HCC) 08/22/2023   Hypokalemia 08/22/2023   History of essential hypertension 08/22/2023   Elevated troponin 08/22/2023   Asthma exacerbation 08/22/2023   Continuous dependence on cigarette smoking 08/22/2023   Shortness of breath 08/22/2023   MDD (major depressive disorder), recurrent severe, without psychosis (HCC) 03/17/2023   Cocaine use disorder, severe, dependence (HCC) 12/12/2016   Home Medication(s) Prior to Admission medications   Medication Sig Start Date End Date Taking? Authorizing Provider  albuterol (VENTOLIN HFA) 108 (90 Base) MCG/ACT inhaler Inhale 2 puffs into the  lungs every 6 (six) hours as needed for wheezing or shortness of breath. 08/25/23   Dezii, Alexandra, DO  losartan (COZAAR) 50 MG tablet Take 1 tablet (50 mg total) by mouth daily. 08/26/23 11/24/23  Debarah Crape, DO  metFORMIN (GLUCOPHAGE) 500 MG tablet Take 1 tablet (500 mg total) by mouth 2 (two) times daily with a meal. 08/25/23 09/24/23  Dezii, Alexandra, DO  metoprolol tartrate (LOPRESSOR) 25 MG tablet Take 1 tablet (25 mg total) by mouth 2 (two) times daily. 08/25/23 11/23/23  Dezii, Alexandra, DO  pantoprazole (PROTONIX) 40 MG tablet Take 1 tablet (40 mg total) by mouth daily. 08/26/23   Dezii, Alexandra, DO  predniSONE (DELTASONE) 20 MG tablet Take 2 tablets (40 mg total) by mouth daily with breakfast for 2 days, THEN 1 tablet (20 mg total) daily with breakfast for 2 days, THEN 0.5 tablets (10 mg total) daily with breakfast for 2 days. 08/25/23 08/31/23  Debarah Crape, DO  Past Surgical History Past Surgical History:  Procedure Laterality Date   CESAREAN SECTION     ECTOPIC PREGNANCY SURGERY  2003   INCISION AND DRAINAGE Left 12/25/2015   Procedure: INCISION AND DRAINAGE With  Amputation of distal tip of Left Great and Second Toe.;  Surgeon: Tarry Kos, MD;  Location: [facility];  Service: Orthopedics;  Laterality: Left;   LIPOMA EXCISION     chest   ORIF HUMERUS FRACTURE Left 07/15/2014   Procedure: OPEN REDUCTION INTERNAL FIXATION (ORIF) LEFT HUMERAL SHAFT ;  Surgeon: Sheral Apley, MD;  Location: [facility];  Service: Orthopedics;  Laterality: Left;   TUBAL LIGATION  2003   Family History History reviewed. No pertinent family history.  Social History Social History   Tobacco Use   Smoking status: Every Day    Current packs/day: 0.50    Average packs/day: 0.5 packs/day for 18.0 years (9.0 ttl pk-yrs)    Types: Cigarettes   Smokeless tobacco:  Never   Tobacco comments:    6 cig./day  Vaping Use   Vaping status: Never Used  Substance Use Topics   Alcohol use: Yes    Comment: occasionally   Drug use: Yes    Types: Marijuana, "Crack" cocaine, MDMA (Ecstacy)    Comment: Crack, THC, Molly   Allergies Patient has no known allergies.  Review of Systems A thorough review of systems was obtained and all systems are negative except as noted in the HPI and PMH.   Physical Exam Vital Signs  I have reviewed the triage vital signs BP (!) 139/93   Pulse 98   Temp (!) 97.4 F (36.3 C)   Resp 13   Ht 5\' 10"  (1.778 m)   Wt 110.2 kg   SpO2 97%   BMI 34.86 kg/m  Physical Exam Vitals and nursing note reviewed.  Constitutional:      General: She is in acute distress.     Appearance: Normal appearance. She is obese.  HENT:     Head: Normocephalic and atraumatic.     Right Ear: External ear normal.     Left Ear: External ear normal.     Nose: Nose normal.     Mouth/Throat:     Mouth: Mucous membranes are moist.  Eyes:     General: No scleral icterus.       Right eye: No discharge.        Left eye: No discharge.  Cardiovascular:     Rate and Rhythm: Normal rate and regular rhythm.     Pulses: Normal pulses.     Heart sounds: Normal heart sounds.  Pulmonary:     Effort: Tachypnea and accessory muscle usage present.     Breath sounds: No stridor. Decreased breath sounds and wheezing present.  Abdominal:     General: Abdomen is flat. There is no distension.     Palpations: Abdomen is soft.     Tenderness: There is no abdominal tenderness.  Musculoskeletal:     Cervical back: No rigidity.     Right lower leg: No edema.     Left lower leg: No edema.  Skin:    General: Skin is warm and dry.     Capillary Refill: Capillary refill takes less than 2 seconds.  Neurological:     Mental Status: She is alert.  Psychiatric:        Mood and Affect: Mood normal.        Behavior: Behavior normal. Behavior is cooperative.  ED Results and Treatments Labs (all labs ordered are listed, but only abnormal results are displayed) Labs Reviewed  BASIC METABOLIC PANEL - Abnormal; Notable for the following components:      Result Value   CO2 21 (*)    Glucose, Bld 317 (*)    All other components within normal limits  CBC WITH DIFFERENTIAL/PLATELET - Abnormal; Notable for the following components:   RBC 5.60 (*)    HCT 47.7 (*)    All other components within normal limits  RESP PANEL BY RT-PCR (RSV, FLU A&B, COVID)  RVPGX2  BRAIN NATRIURETIC PEPTIDE  HIV ANTIBODY (ROUTINE TESTING W REFLEX)  PREGNANCY, URINE                                                                                                                          Radiology DG Chest Port 1 View Result Date: 08/25/2023 CLINICAL DATA:  Shortness of breath EXAM: PORTABLE CHEST 1 VIEW COMPARISON:  08/23/2023 FINDINGS: The heart size and mediastinal contours are within normal limits. Both lungs are clear. The visualized skeletal structures are unremarkable. IMPRESSION: No active disease. Electronically Signed   By: Alcide Clever M.D.   On: 08/25/2023 22:47    Pertinent labs & imaging results that were available during my care of the patient were reviewed by me and considered in my medical decision making (see MDM for details).  Medications Ordered in ED Medications  albuterol (VENTOLIN HFA) 108 (90 Base) MCG/ACT inhaler 2 puff (2 puffs Inhalation Given 08/25/23 1821)  ipratropium-albuterol (DUONEB) 0.5-2.5 (3) MG/3ML nebulizer solution 3 mL (3 mLs Nebulization Given 08/25/23 2039)  methylPREDNISolone sodium succinate (SOLU-MEDROL) 125 mg/2 mL injection 62.5 mg (62.5 mg Intravenous Given 08/25/23 2204)  magnesium sulfate IVPB 2 g 50 mL (0 g Intravenous Stopped 08/25/23 2317)  sodium chloride 0.9 % bolus 1,000 mL (0 mLs Intravenous Stopped 08/25/23 2316)                                                                                                                                      Procedures .Critical Care  Performed by: Sloan Leiter, DO Authorized by: Sloan Leiter, DO   Critical care provider statement:    Critical care time (minutes):  30   Critical care time was exclusive of:  Separately billable procedures and treating other patients   Critical care was necessary to treat  or prevent imminent or life-threatening deterioration of the following conditions:  Respiratory failure   Critical care was time spent personally by me on the following activities:  Development of treatment plan with patient or surrogate, discussions with consultants, evaluation of patient's response to treatment, examination of patient, ordering and review of laboratory studies, ordering and review of radiographic studies, ordering and performing treatments and interventions, pulse oximetry, re-evaluation of patient's condition, review of old charts and obtaining history from patient or surrogate   (including critical care time)  Medical Decision Making / ED Course    Medical Decision Making:    Uyen Eichholz Mccloud is a 48 y.o. female  with past medical history as below, significant for asthma, DM, CHF who presents to the ED with complaint of dyspnea. The complaint involves an extensive differential diagnosis and also carries with it a high risk of complications and morbidity.  Serious etiology was considered. Ddx includes but is not limited to: In my evaluation of this patient's dyspnea my DDx includes, but is not limited to, pneumonia, pulmonary embolism, pneumothorax, pulmonary edema, metabolic acidosis, asthma, COPD, cardiac cause, anemia, anxiety, etc.    Complete initial physical exam performed, notably the patient was in mild respiratory distress, poor air movement..    Reviewed and confirmed nursing documentation for past medical history, family history, social history.  Vital signs reviewed.   Clinical Course as of 08/25/23 2342  Thu Aug 25, 2023  2028 Pt  requesting HIV testing [SG]    Clinical Course User Index [SG] Sloan Leiter, DO    Brief summary: 48 year old female discharged earlier today to the ER for difficulty breathing, could not get her albuterol inhaler at the pharmacy as they were close. Incrased work of breathing on my assessment, poor air movement.  Some wheezing.  Given nebulized breathing treatments, magnesium sulfate, Solu-Medrol, IV fluids.  Check screening labs, get chest x-ray.    Labs reviewed, these are stable, she is feeling much better after nebulized breathing treatments and steroids.  Able to discontinue supplemental oxygen, breath sounds greatly improved.  She is feeling much better.  Requesting HIV testing, has no symptoms but would like the labs drawn.  Advised her to follow-up on MyChart regarding her results.  Encouraged outpatient follow-up PCP.  Continue medications are prescribed to her discharge earlier today. Stop smoking  The patient improved significantly and was discharged in stable condition. Detailed discussions were had with the patient/guardian regarding current findings, and need for close f/u with PCP or on call doctor. The patient/guardian has been instructed to return immediately if the symptoms worsen in any way for re-evaluation. Patient/guardian verbalized understanding and is in agreement with current care plan. All questions answered prior to discharge.              Additional history obtained: -Additional history obtained from friend -External records from outside source obtained and reviewed including: Chart review including previous notes, labs, imaging, consultation notes including  Recent admission, home medications, prior labs and imaging   Lab Tests: -I ordered, reviewed, and interpreted labs.   The pertinent results include:   Labs Reviewed  BASIC METABOLIC PANEL - Abnormal; Notable for the following components:      Result Value   CO2 21 (*)    Glucose, Bld  317 (*)    All other components within normal limits  CBC WITH DIFFERENTIAL/PLATELET - Abnormal; Notable for the following components:   RBC 5.60 (*)    HCT 47.7 (*)  All other components within normal limits  RESP PANEL BY RT-PCR (RSV, FLU A&B, COVID)  RVPGX2  BRAIN NATRIURETIC PEPTIDE  HIV ANTIBODY (ROUTINE TESTING W REFLEX)  PREGNANCY, URINE    Notable for labs stable  EKG   EKG Interpretation Date/Time:  Thursday August 25 2023 20:26:51 EST Ventricular Rate:  99 PR Interval:  114 QRS Duration:  100 QT Interval:  364 QTC Calculation: 468 R Axis:   -15  Text Interpretation: Sinus rhythm Consider right atrial enlargement LVH with secondary repolarization abnormality Anterior Q waves, possibly due to LVH similar to prior Confirmed by Tanda Rockers ([id number]) on 08/25/2023 8:34:58 PM         Imaging Studies ordered: I ordered imaging studies including CXR I independently visualized the following imaging with scope of interpretation limited to determining acute life threatening conditions related to emergency care; findings noted above I independently visualized and interpreted imaging. I agree with the radiologist interpretation   Medicines ordered and prescription drug management: Meds ordered this encounter  Medications   albuterol (VENTOLIN HFA) 108 (90 Base) MCG/ACT inhaler 2 puff   ipratropium-albuterol (DUONEB) 0.5-2.5 (3) MG/3ML nebulizer solution 3 mL   methylPREDNISolone sodium succinate (SOLU-MEDROL) 125 mg/2 mL injection 62.5 mg   magnesium sulfate IVPB 2 g 50 mL   sodium chloride 0.9 % bolus 1,000 mL    -I have reviewed the patients home medicines and have made adjustments as needed   Consultations Obtained: na   Cardiac Monitoring: The patient was maintained on a cardiac monitor.  I personally viewed and interpreted the cardiac monitored which showed an underlying rhythm of: nsr Continuous pulse oximetry interpreted by myself, 97% on RA.    Social  Determinants of Health:  Diagnosis or treatment significantly limited by social determinants of health: current smoker and obesity Counseled patient for approximately 3 minutes regarding smoking cessation. Discussed risks of smoking and how they applied and affected their visit here today. Patient not ready to quit at this time, however will follow up with their primary doctor when they are.   CPT code: 28413: intermediate counseling for smoking cessation     Reevaluation: After the interventions noted above, I reevaluated the patient and found that they have improved  Co morbidities that complicate the patient evaluation  Past Medical History:  Diagnosis Date   Asthma    Diabetes mellitus without complication (HCC)    Fracture, humerus closed, shaft 07/11/2014   left   History of bronchitis    Mental health problem    Open fracture of great toe of left foot 12/25/2015      Dispostion: Disposition decision including need for hospitalization was considered, and patient discharged from emergency department.    Final Clinical Impression(s) / ED Diagnoses Final diagnoses:  Moderate persistent asthma with exacerbation        Sloan Leiter, DO 08/25/23 2342

## 2023-08-25 NOTE — Hospital Course (Signed)
 Julie [name] is a 48 year old female with history of asthma, depression, cocaine abuse, heart failure with preserved EF, hypertension, medication nonadherence, diabetes, presents the ED complaining of shortness of breath and cough.  She was diagnosed with an asthma exacerbation and admitted.  Patient does have a tobacco abuse history and there is concern that there may be some COPD overlap, though she denies an official diagnosis.  Her respiratory viral panel was negative.  Chest x-ray was without active disease.  CTA was performed which revealed evidence of reactive bronchitis but negative for PE.  She was started on steroids, nebulizers, and supplemental oxygen.  She did also receive azithromycin.  Gradually her breathing improved, and she was able to wean entirely off of oxygen. Her stay was further complicated by complaints of chest pain.  Troponins had downward trend and chest pain resolved on its own.  Costochondritic pain was treated with IV Toradol. By reevaluation on 3/6 patient was breathing easily on room air.  She reported feeling ready for discharge home.  Further care as below.  Asthma exacerbation - May be some COPD overlap given tobacco abuse - Respiratory viral panel negative - Chest x-ray without active disease - CTA negative for PE, evidence of reactive bronchitis/reactive airways - Continue steroids, tapering - s/p azithromycin -- On room air -- Albuterol PRN at home  Atypical Chest pain Subcostal and epigastric discomfort - Chest x-ray without acute changes - Pleuritic chest pain and epigastric tenderness resolved with NSAIDs -- Trop repeat lower than previous - As needed Tums   Type 2 diabetes, uncontrolled - Hemoglobin A1c 8.5% - Not taking any medications outpatient. Start metformin, will need increase outpt when tolerating - Hyperglycemia is exacerbated by steroids - Continue diabetic diet - Needs close outpatient follow-up with PCP, would benefit from additional oral  agents   Lower extremity edema Heart failure preserved EF exacerbation - BNP on arrival 402 - TTE LVEF 50 to 55%, diastolic dysfunction possible - Status post IV Lasix, euvolemic now.   Elevated troponin - Mild troponin elevation thought to be nonischemic.  No regional wall motion abnormality seen on TTE. Chest pain resolved without intervention   Hypertension - Has not been taking medications outpatient - Currently on Norvasc, losartan, and Lasix. Optimize losartan, DC amlodipine. -- Needs close outpt follow up - TOC consult for medication assistance   Hypokalemia - Replace as needed   Major depressive disorder - No medications outpatient, patient reports depression is in remission - Outpatient follow-up   Tobacco abuse - Current 1 pack a day smoker, has been counseled on cessation - High-dose nicotine patch while admitted   Cocaine abuse - Complicates the above - Patient has been counseled on cessation

## 2023-08-25 NOTE — ED Provider Triage Note (Signed)
 Emergency Medicine Provider Triage Evaluation Note  Julie [name] , a 48 y.o. female  was evaluated in triage.  Pt complains of shortness of breath.  Discharge earlier today, was unable to get to the pharmacy before closed.  Review of Systems  Positive: As above Negative: As above  Physical Exam  BP (!) 154/97 (BP Location: Right Arm)   Pulse 96   Temp (!) 97.4 F (36.3 C)   Resp (!) 22   Ht 5\' 10"  (1.778 m)   Wt 110.2 kg   SpO2 99%   BMI 34.86 kg/m  Gen:   Awake, no distress   Resp:  Normal effort  MSK:   Moves extremities without difficulty  Other:    Medical Decision Making  Medically screening exam initiated at 6:44 PM.  Appropriate orders placed.  PAVNEET MARKWOOD was informed that the remainder of the evaluation will be completed by another provider, this initial triage assessment does not replace that evaluation, and the importance of remaining in the ED until their evaluation is complete.  Workup initiated   Michelle Piper, Cordelia Poche 08/25/23 1845

## 2023-08-26 ENCOUNTER — Other Ambulatory Visit (HOSPITAL_COMMUNITY): Payer: Self-pay

## 2023-08-30 ENCOUNTER — Other Ambulatory Visit (HOSPITAL_COMMUNITY): Payer: Self-pay

## 2023-09-20 ENCOUNTER — Encounter (HOSPITAL_COMMUNITY): Payer: Self-pay

## 2023-09-20 ENCOUNTER — Observation Stay (HOSPITAL_COMMUNITY)
Admission: EM | Admit: 2023-09-20 | Discharge: 2023-09-22 | Discharge status: Against Medical Advice | Payer: MEDICAID | Attending: Internal Medicine | Admitting: Internal Medicine

## 2023-09-20 ENCOUNTER — Emergency Department (HOSPITAL_COMMUNITY): Payer: MEDICAID

## 2023-09-20 ENCOUNTER — Other Ambulatory Visit: Payer: Self-pay

## 2023-09-20 DIAGNOSIS — J45901 Unspecified asthma with (acute) exacerbation: Principal | ICD-10-CM | POA: Insufficient documentation

## 2023-09-20 DIAGNOSIS — F1910 Other psychoactive substance abuse, uncomplicated: Secondary | ICD-10-CM | POA: Insufficient documentation

## 2023-09-20 DIAGNOSIS — I160 Hypertensive urgency: Secondary | ICD-10-CM | POA: Insufficient documentation

## 2023-09-20 DIAGNOSIS — Z7982 Long term (current) use of aspirin: Secondary | ICD-10-CM | POA: Insufficient documentation

## 2023-09-20 DIAGNOSIS — J810 Acute pulmonary edema: Principal | ICD-10-CM | POA: Insufficient documentation

## 2023-09-20 DIAGNOSIS — F17210 Nicotine dependence, cigarettes, uncomplicated: Secondary | ICD-10-CM | POA: Insufficient documentation

## 2023-09-20 DIAGNOSIS — R0602 Shortness of breath: Secondary | ICD-10-CM

## 2023-09-20 DIAGNOSIS — R7989 Other specified abnormal findings of blood chemistry: Secondary | ICD-10-CM

## 2023-09-20 DIAGNOSIS — R778 Other specified abnormalities of plasma proteins: Secondary | ICD-10-CM | POA: Diagnosis not present

## 2023-09-20 DIAGNOSIS — E876 Hypokalemia: Secondary | ICD-10-CM | POA: Diagnosis not present

## 2023-09-20 DIAGNOSIS — Z7984 Long term (current) use of oral hypoglycemic drugs: Secondary | ICD-10-CM | POA: Insufficient documentation

## 2023-09-20 DIAGNOSIS — R030 Elevated blood-pressure reading, without diagnosis of hypertension: Secondary | ICD-10-CM

## 2023-09-20 DIAGNOSIS — E1165 Type 2 diabetes mellitus with hyperglycemia: Secondary | ICD-10-CM | POA: Diagnosis not present

## 2023-09-20 DIAGNOSIS — J441 Chronic obstructive pulmonary disease with (acute) exacerbation: Secondary | ICD-10-CM | POA: Insufficient documentation

## 2023-09-20 DIAGNOSIS — F32A Depression, unspecified: Secondary | ICD-10-CM | POA: Insufficient documentation

## 2023-09-20 DIAGNOSIS — Z79899 Other long term (current) drug therapy: Secondary | ICD-10-CM | POA: Insufficient documentation

## 2023-09-20 DIAGNOSIS — I110 Hypertensive heart disease with heart failure: Secondary | ICD-10-CM | POA: Diagnosis not present

## 2023-09-20 DIAGNOSIS — Z5900 Homelessness unspecified: Secondary | ICD-10-CM | POA: Insufficient documentation

## 2023-09-20 DIAGNOSIS — I5030 Unspecified diastolic (congestive) heart failure: Secondary | ICD-10-CM | POA: Insufficient documentation

## 2023-09-20 DIAGNOSIS — Z5329 Procedure and treatment not carried out because of patient's decision for other reasons: Secondary | ICD-10-CM | POA: Insufficient documentation

## 2023-09-20 DIAGNOSIS — R0789 Other chest pain: Secondary | ICD-10-CM | POA: Diagnosis present

## 2023-09-20 DIAGNOSIS — Z7951 Long term (current) use of inhaled steroids: Secondary | ICD-10-CM | POA: Diagnosis not present

## 2023-09-20 DIAGNOSIS — I501 Left ventricular failure: Secondary | ICD-10-CM | POA: Diagnosis present

## 2023-09-20 LAB — CBC
HCT: 40.4 % (ref 36.0–46.0)
Hemoglobin: 12.6 g/dL (ref 12.0–15.0)
MCH: 26.8 pg (ref 26.0–34.0)
MCHC: 31.2 g/dL (ref 30.0–36.0)
MCV: 85.8 fL (ref 80.0–100.0)
Platelets: 242 10*3/uL (ref 150–400)
RBC: 4.71 MIL/uL (ref 3.87–5.11)
RDW: 15.1 % (ref 11.5–15.5)
WBC: 7.2 10*3/uL (ref 4.0–10.5)
nRBC: 0 % (ref 0.0–0.2)

## 2023-09-20 LAB — TROPONIN I (HIGH SENSITIVITY): Troponin I (High Sensitivity): 32 ng/L — ABNORMAL HIGH (ref <18)

## 2023-09-20 LAB — BASIC METABOLIC PANEL WITH GFR
Anion gap: 10 (ref 5–15)
BUN: 8 mg/dL (ref 6–20)
CO2: 23 mmol/L (ref 22–32)
Calcium: 10.1 mg/dL (ref 8.9–10.3)
Chloride: 109 mmol/L (ref 98–111)
Creatinine, Ser: 0.7 mg/dL (ref 0.44–1.00)
GFR, Estimated: >60 mL/min (ref >60)
Glucose, Bld: 208 mg/dL — ABNORMAL HIGH (ref 70–99)
Potassium: 3.5 mmol/L (ref 3.5–5.1)
Sodium: 142 mmol/L (ref 135–145)

## 2023-09-20 LAB — HCG, SERUM, QUALITATIVE: Preg, Serum: NEGATIVE

## 2023-09-20 MED ORDER — ALBUTEROL SULFATE (2.5 MG/3ML) 0.083% IN NEBU
2.5000 mg | INHALATION_SOLUTION | Freq: Once | RESPIRATORY_TRACT | Status: AC
  Administered 2023-09-20: 2.5 mg via RESPIRATORY_TRACT
  Filled 2023-09-20: qty 3

## 2023-09-20 NOTE — ED Notes (Signed)
 Pt requesting oxygen due to increased SOB. Pt taken to triage for reeval

## 2023-09-20 NOTE — ED Triage Notes (Signed)
 Pt reports central chest pain and shortness of breath x 4 days. Pain worse with taking a deep breath and/or coughing.

## 2023-09-21 ENCOUNTER — Other Ambulatory Visit: Payer: Self-pay

## 2023-09-21 ENCOUNTER — Emergency Department (HOSPITAL_COMMUNITY): Payer: MEDICAID

## 2023-09-21 DIAGNOSIS — I501 Left ventricular failure: Secondary | ICD-10-CM | POA: Diagnosis present

## 2023-09-21 DIAGNOSIS — I160 Hypertensive urgency: Secondary | ICD-10-CM | POA: Diagnosis not present

## 2023-09-21 DIAGNOSIS — J441 Chronic obstructive pulmonary disease with (acute) exacerbation: Secondary | ICD-10-CM

## 2023-09-21 DIAGNOSIS — F1910 Other psychoactive substance abuse, uncomplicated: Secondary | ICD-10-CM

## 2023-09-21 DIAGNOSIS — R0789 Other chest pain: Secondary | ICD-10-CM | POA: Diagnosis not present

## 2023-09-21 LAB — RAPID URINE DRUG SCREEN, HOSP PERFORMED
Amphetamines: NOT DETECTED
Barbiturates: NOT DETECTED
Benzodiazepines: NOT DETECTED
Cocaine: POSITIVE — AB
Opiates: NOT DETECTED
Tetrahydrocannabinol: POSITIVE — AB

## 2023-09-21 LAB — CREATININE, SERUM
Creatinine, Ser: 1.07 mg/dL — ABNORMAL HIGH (ref 0.44–1.00)
GFR, Estimated: >60 mL/min (ref >60)

## 2023-09-21 LAB — TROPONIN I (HIGH SENSITIVITY): Troponin I (High Sensitivity): 33 ng/L — ABNORMAL HIGH (ref <18)

## 2023-09-21 LAB — CBG MONITORING, ED
Glucose-Capillary: 417 mg/dL — ABNORMAL HIGH (ref 70–99)
Glucose-Capillary: 344 mg/dL — ABNORMAL HIGH (ref 70–99)

## 2023-09-21 LAB — GLUCOSE, CAPILLARY
Glucose-Capillary: 387 mg/dL — ABNORMAL HIGH (ref 70–99)
Glucose-Capillary: 212 mg/dL — ABNORMAL HIGH (ref 70–99)

## 2023-09-21 MED ORDER — NITROGLYCERIN 2 % TD OINT
0.5000 [in_us] | TOPICAL_OINTMENT | Freq: Once | TRANSDERMAL | Status: AC
Start: 2023-09-21 — End: 2023-09-21
  Administered 2023-09-21: 0.5 [in_us] via TOPICAL
  Filled 2023-09-21: qty 1

## 2023-09-21 MED ORDER — ENOXAPARIN SODIUM 60 MG/0.6ML IJ SOSY
50.0000 mg | PREFILLED_SYRINGE | INTRAMUSCULAR | Status: DC
  Administered 2023-09-21 – 2023-09-22 (×2): 50 mg via SUBCUTANEOUS
  Filled 2023-09-21 (×2): qty 0.6

## 2023-09-21 MED ORDER — FUROSEMIDE 10 MG/ML IJ SOLN: 40.0000 mg | Freq: Two times a day (BID) | INTRAMUSCULAR | Status: DC

## 2023-09-21 MED ORDER — PREDNISONE 20 MG PO TABS
40.0000 mg | ORAL_TABLET | Freq: Every day | ORAL | Status: DC
Start: 2023-09-21 — End: 2023-09-22
  Administered 2023-09-21 – 2023-09-22 (×2): 40 mg via ORAL
  Filled 2023-09-21 (×2): qty 2

## 2023-09-21 MED ORDER — IOHEXOL 350 MG/ML SOLN
75.0000 mL | Freq: Once | INTRAVENOUS | Status: AC | PRN
  Administered 2023-09-21: 75 mL via INTRAVENOUS

## 2023-09-21 MED ORDER — ONDANSETRON HCL 4 MG PO TABS: 4.0000 mg | ORAL_TABLET | Freq: Four times a day (QID) | ORAL | Status: DC | PRN

## 2023-09-21 MED ORDER — FUROSEMIDE 10 MG/ML IJ SOLN
40.0000 mg | Freq: Once | INTRAMUSCULAR | Status: AC
  Administered 2023-09-21: 40 mg via INTRAVENOUS
  Filled 2023-09-21: qty 4

## 2023-09-21 MED ORDER — CLONIDINE HCL 0.1 MG PO TABS
0.1000 mg | ORAL_TABLET | Freq: Three times a day (TID) | ORAL | Status: DC | PRN
Start: 2023-09-21 — End: 2023-09-22
  Administered 2023-09-21: 0.1 mg via ORAL
  Filled 2023-09-21: qty 1

## 2023-09-21 MED ORDER — PANTOPRAZOLE SODIUM 40 MG PO TBEC
40.0000 mg | DELAYED_RELEASE_TABLET | Freq: Every day | ORAL | Status: DC
  Administered 2023-09-21 – 2023-09-22 (×2): 40 mg via ORAL
  Filled 2023-09-21 (×2): qty 1

## 2023-09-21 MED ORDER — HYDRALAZINE HCL 20 MG/ML IJ SOLN
10.0000 mg | Freq: Four times a day (QID) | INTRAMUSCULAR | Status: DC | PRN
  Administered 2023-09-21: 10 mg via INTRAVENOUS
  Filled 2023-09-21: qty 1

## 2023-09-21 MED ORDER — SODIUM CHLORIDE 0.9% FLUSH
3.0000 mL | Freq: Two times a day (BID) | INTRAVENOUS | Status: DC
  Administered 2023-09-21 – 2023-09-22 (×3): 3 mL via INTRAVENOUS

## 2023-09-21 MED ORDER — IPRATROPIUM-ALBUTEROL 0.5-2.5 (3) MG/3ML IN SOLN
3.0000 mL | Freq: Four times a day (QID) | RESPIRATORY_TRACT | Status: DC
  Administered 2023-09-21 – 2023-09-22 (×4): 3 mL via RESPIRATORY_TRACT
  Filled 2023-09-21 (×5): qty 3

## 2023-09-21 MED ORDER — AMLODIPINE BESYLATE 5 MG PO TABS
5.0000 mg | ORAL_TABLET | Freq: Every day | ORAL | Status: DC
  Administered 2023-09-21: 5 mg via ORAL
  Filled 2023-09-21: qty 1

## 2023-09-21 MED ORDER — INSULIN ASPART 100 UNIT/ML IJ SOLN
0.0000 [IU] | Freq: Every day | INTRAMUSCULAR | Status: DC
  Administered 2023-09-21: 2 [IU] via SUBCUTANEOUS

## 2023-09-21 MED ORDER — AMLODIPINE BESYLATE 10 MG PO TABS
10.0000 mg | ORAL_TABLET | Freq: Every day | ORAL | Status: DC
  Administered 2023-09-22: 10 mg via ORAL
  Filled 2023-09-21: qty 1

## 2023-09-21 MED ORDER — LOSARTAN POTASSIUM 50 MG PO TABS
50.0000 mg | ORAL_TABLET | Freq: Every day | ORAL | Status: DC
  Administered 2023-09-21 – 2023-09-22 (×2): 50 mg via ORAL
  Filled 2023-09-21 (×2): qty 1

## 2023-09-21 MED ORDER — ALBUTEROL SULFATE (2.5 MG/3ML) 0.083% IN NEBU
3.0000 mL | INHALATION_SOLUTION | Freq: Four times a day (QID) | RESPIRATORY_TRACT | Status: DC | PRN
Start: 2023-09-21 — End: 2023-09-22

## 2023-09-21 MED ORDER — ASPIRIN 81 MG PO CHEW
324.0000 mg | CHEWABLE_TABLET | Freq: Once | ORAL | Status: AC
  Administered 2023-09-21: 324 mg via ORAL
  Filled 2023-09-21: qty 4

## 2023-09-21 MED ORDER — KETOROLAC TROMETHAMINE 15 MG/ML IJ SOLN
15.0000 mg | Freq: Once | INTRAMUSCULAR | Status: AC | PRN
  Administered 2023-09-21: 15 mg via INTRAVENOUS
  Filled 2023-09-21: qty 1

## 2023-09-21 MED ORDER — AMLODIPINE BESYLATE 5 MG PO TABS
5.0000 mg | ORAL_TABLET | Freq: Once | ORAL | Status: AC
Start: 2023-09-21 — End: 2023-09-21
  Administered 2023-09-21: 5 mg via ORAL
  Filled 2023-09-21: qty 1

## 2023-09-21 MED ORDER — HYDRALAZINE HCL 20 MG/ML IJ SOLN
10.0000 mg | INTRAMUSCULAR | Status: DC | PRN
  Administered 2023-09-21: 10 mg via INTRAVENOUS
  Filled 2023-09-21: qty 1

## 2023-09-21 MED ORDER — BUDESONIDE 0.25 MG/2ML IN SUSP
0.2500 mg | Freq: Two times a day (BID) | RESPIRATORY_TRACT | Status: DC
  Administered 2023-09-21 – 2023-09-22 (×3): 0.25 mg via RESPIRATORY_TRACT
  Filled 2023-09-21 (×3): qty 2

## 2023-09-21 MED ORDER — METOPROLOL TARTRATE 25 MG PO TABS: 25.0000 mg | ORAL_TABLET | Freq: Two times a day (BID) | ORAL | Status: DC

## 2023-09-21 MED ORDER — ONDANSETRON HCL 4 MG/2ML IJ SOLN: 4.0000 mg | Freq: Four times a day (QID) | INTRAMUSCULAR | Status: DC | PRN

## 2023-09-21 MED ORDER — INSULIN ASPART 100 UNIT/ML IJ SOLN
0.0000 [IU] | Freq: Three times a day (TID) | INTRAMUSCULAR | Status: DC
  Administered 2023-09-21: 15 [IU] via SUBCUTANEOUS
  Administered 2023-09-21 – 2023-09-22 (×3): 11 [IU] via SUBCUTANEOUS
  Administered 2023-09-22: 15 [IU] via SUBCUTANEOUS

## 2023-09-21 MED ORDER — ALBUTEROL SULFATE (2.5 MG/3ML) 0.083% IN NEBU
5.0000 mg | INHALATION_SOLUTION | Freq: Once | RESPIRATORY_TRACT | Status: AC
  Administered 2023-09-21: 5 mg via RESPIRATORY_TRACT
  Filled 2023-09-21: qty 6

## 2023-09-21 MED ORDER — IPRATROPIUM BROMIDE 0.02 % IN SOLN
0.5000 mg | Freq: Once | RESPIRATORY_TRACT | Status: AC
  Administered 2023-09-21: 0.5 mg via RESPIRATORY_TRACT
  Filled 2023-09-21: qty 2.5

## 2023-09-21 MED ORDER — METHYLPREDNISOLONE SODIUM SUCC 125 MG IJ SOLR
125.0000 mg | INTRAMUSCULAR | Status: AC
  Administered 2023-09-21: 125 mg via INTRAVENOUS
  Filled 2023-09-21: qty 2

## 2023-09-21 MED ORDER — ACETAMINOPHEN 325 MG PO TABS
650.0000 mg | ORAL_TABLET | Freq: Four times a day (QID) | ORAL | Status: DC | PRN
  Administered 2023-09-21 – 2023-09-22 (×2): 650 mg via ORAL
  Filled 2023-09-21 (×2): qty 2

## 2023-09-21 MED ORDER — NICOTINE 14 MG/24HR TD PT24
14.0000 mg | MEDICATED_PATCH | Freq: Every day | TRANSDERMAL | Status: DC
  Filled 2023-09-21 (×2): qty 1

## 2023-09-21 NOTE — ED Provider Notes (Signed)
 [facility] AT [facility] Provider Note   CSN: [id number] Arrival date & time: 09/20/23  2102     History  Chief Complaint  Patient presents with   Chest Pain    Julie [name] is a 48 y.o. female.  The history is provided by the patient.  Chest Pain Pain location:  Substernal area Pain quality: sharp   Pain radiates to:  Does not radiate Pain severity:  Moderate Onset quality:  Gradual Duration: shortness of breath x 4 days and. Timing:  Constant Progression:  Worsening Chronicity:  New Context: not breathing   Relieved by:  Nothing Worsened by:  Nothing Ineffective treatments:  None tried Associated symptoms: no fever, no nausea, no vomiting and no weakness   Risk factors: no aortic disease   Patient with asthma who presents with 4 days of worsening SOB and 1 day of intermittent sharp CP.  No fevers, some wheezing.      Past Medical History:  Diagnosis Date   Asthma    Diabetes mellitus without complication (HCC)    Fracture, humerus closed, shaft 07/11/2014   left   History of bronchitis    Mental health problem    Open fracture of great toe of left foot 12/25/2015     Home Medications Prior to Admission medications   Medication Sig Start Date End Date Taking? Authorizing Provider  albuterol (VENTOLIN HFA) 108 (90 Base) MCG/ACT inhaler Inhale 2 puffs into the lungs every 6 (six) hours as needed for wheezing or shortness of breath. 08/25/23   Dezii, Alexandra, DO  losartan (COZAAR) 50 MG tablet Take 1 tablet (50 mg total) by mouth daily. 08/26/23 11/24/23  Debarah Crape, DO  metFORMIN (GLUCOPHAGE) 500 MG tablet Take 1 tablet (500 mg total) by mouth 2 (two) times daily with a meal. 08/25/23 09/24/23  Dezii, Alexandra, DO  metoprolol tartrate (LOPRESSOR) 25 MG tablet Take 1 tablet (25 mg total) by mouth 2 (two) times daily. 08/25/23 11/23/23  Dezii, Alexandra, DO  pantoprazole (PROTONIX) 40 MG tablet Take 1 tablet (40 mg total) by mouth  daily. 08/26/23   Debarah Crape, DO      Allergies    Patient has no known allergies.    Review of Systems   Review of Systems  Constitutional:  Negative for fever.  Cardiovascular:  Positive for chest pain.  Gastrointestinal:  Negative for nausea and vomiting.  Neurological:  Negative for weakness.  All other systems reviewed and are negative.   Physical Exam Updated Vital Signs BP (!) 183/123   Pulse (!) 108   Temp 98.9 F (37.2 C)   Resp 18   Ht 5\' 10"  (1.778 m)   Wt 109.8 kg   SpO2 94%   BMI 34.72 kg/m  Physical Exam Vitals and nursing note reviewed.  Constitutional:      General: She is not in acute distress.    Appearance: Normal appearance. She is well-developed.  HENT:     Head: Normocephalic and atraumatic.     Nose: Nose normal.  Eyes:     Pupils: Pupils are equal, round, and reactive to light.  Cardiovascular:     Rate and Rhythm: Normal rate and regular rhythm.     Pulses: Normal pulses.     Heart sounds: Normal heart sounds.  Pulmonary:     Effort: Pulmonary effort is normal. No respiratory distress.     Breath sounds: Wheezing and rales present.  Abdominal:     General: Bowel sounds  are normal. There is no distension.     Palpations: Abdomen is soft. There is no mass.     Tenderness: There is no abdominal tenderness. There is no guarding or rebound.  Genitourinary:    Vagina: No vaginal discharge.  Musculoskeletal:        General: Normal range of motion.     Cervical back: Normal range of motion and neck supple.  Skin:    General: Skin is warm and dry.     Capillary Refill: Capillary refill takes less than 2 seconds.     Findings: No erythema or rash.  Neurological:     General: No focal deficit present.     Mental Status: She is alert.     Deep Tendon Reflexes: Reflexes normal.  Psychiatric:        Mood and Affect: Mood normal.     ED Results / Procedures / Treatments   Labs (all labs ordered are listed, but only abnormal results are  displayed) Results for orders placed or performed during the hospital encounter of 09/20/23  Basic metabolic panel   Collection Time: 09/20/23  9:34 PM  Result Value Ref Range   Sodium 142 135 - 145 mmol/L   Potassium 3.5 3.5 - 5.1 mmol/L   Chloride 109 98 - 111 mmol/L   CO2 23 22 - 32 mmol/L   Glucose, Bld 208 (H) 70 - 99 mg/dL   BUN 8 6 - 20 mg/dL   Creatinine, Ser 1.61 0.44 - 1.00 mg/dL   Calcium 09.6 8.9 - 04.5 mg/dL   GFR, Estimated >40 >98 mL/min   Anion gap 10 5 - 15  CBC   Collection Time: 09/20/23  9:34 PM  Result Value Ref Range   WBC 7.2 4.0 - 10.5 K/uL   RBC 4.71 3.87 - 5.11 MIL/uL   Hemoglobin 12.6 12.0 - 15.0 g/dL   HCT 11.9 14.7 - 82.9 %   MCV 85.8 80.0 - 100.0 fL   MCH 26.8 26.0 - 34.0 pg   MCHC 31.2 30.0 - 36.0 g/dL   RDW 56.2 13.0 - 86.5 %   Platelets 242 150 - 400 K/uL   nRBC 0.0 0.0 - 0.2 %  hCG, serum, qualitative   Collection Time: 09/20/23  9:34 PM  Result Value Ref Range   Preg, Serum NEGATIVE NEGATIVE  Troponin I (High Sensitivity)   Collection Time: 09/20/23  9:34 PM  Result Value Ref Range   Troponin I (High Sensitivity) 32 (H) <18 ng/L  Troponin I (High Sensitivity)   Collection Time: 09/20/23 11:54 PM  Result Value Ref Range   Troponin I (High Sensitivity) 33 (H) <18 ng/L   CT Angio Chest PE W and/or Wo Contrast Result Date: 09/21/2023 CLINICAL DATA:  Chest pain, syncope/presyncope, PE suspected EXAM: CT ANGIOGRAPHY CHEST WITH CONTRAST TECHNIQUE: Multidetector CT imaging of the chest was performed using the standard protocol during bolus administration of intravenous contrast. Multiplanar CT image reconstructions and MIPs were obtained to evaluate the vascular anatomy. RADIATION DOSE REDUCTION: This exam was performed according to the departmental dose-optimization program which includes automated exposure control, adjustment of the mA and/or kV according to patient size and/or use of iterative reconstruction technique. CONTRAST:  75 mL Omnipaque  350 COMPARISON:  Chest radiograph 09/20/2023; CTA chest 08/22/2023 FINDINGS: Cardiovascular: Respiratory motion obscures the segmental and subsegmental pulmonary arteries. No central pulmonary embolism. No pericardial effusion. Normal caliber thoracic aorta. Cardiomegaly. Mediastinum/Nodes: Bowing of the posterior trachea compatible with expiratory phase. Esophagus is unremarkable. Similar  shotty mediastinal and hilar lymph nodes. Lungs/Pleura: Respiratory motion obscures fine detail. Diffuse bronchial wall and interlobular septal thickening. Patchy ground-glass opacities greater Sten the lower lungs. Atelectasis along the right minor fissure. No pleural effusion or pneumothorax. Upper Abdomen: Bilateral adrenal nodules are partially visualized and were previously characterized as benign on 02/17/2023 CT abdomen pelvis. No follow-up recommended. Partially visualized left renal cysts. No follow-up recommended. Musculoskeletal: No acute fracture. Unchanged 2 cm cystic lesion in the medial left upper chest likely a sebaceous cyst. Review of the MIP images confirms the above findings. IMPRESSION: 1. Respiratory motion obscures the segmental and subsegmental pulmonary arteries. No central pulmonary embolism. 2. Cardiomegaly with pulmonary edema. Electronically Signed   By: Minerva Fester M.D.   On: 09/21/2023 04:00   DG Chest 2 View Result Date: 09/20/2023 CLINICAL DATA:  Chest pain. EXAM: CHEST - 2 VIEW COMPARISON:  Chest recommended 08/25/2023. FINDINGS: Cardiomegaly with vascular congestion. Linear density along the minor fusion may represent atelectasis or small pleural effusion. Infiltrate is not excluded. No pneumothorax. No acute osseous pathology. IMPRESSION: 1. Cardiomegaly with vascular congestion. 2. Atelectasis versus small pleural effusion along the minor fissure. Electronically Signed   By: Elgie Collard M.D.   On: 09/20/2023 21:51   DG Chest Port 1 View Result Date: 08/25/2023 CLINICAL DATA:   Shortness of breath EXAM: PORTABLE CHEST 1 VIEW COMPARISON:  08/23/2023 FINDINGS: The heart size and mediastinal contours are within normal limits. Both lungs are clear. The visualized skeletal structures are unremarkable. IMPRESSION: No active disease. Electronically Signed   By: Alcide Clever M.D.   On: 08/25/2023 22:47   DG Chest 2 View Result Date: 08/23/2023 CLINICAL DATA:  [id number] Chest pain [id number] EXAM: CHEST - 2 VIEW COMPARISON:  08/22/2023 FINDINGS: Lungs are clear.  No pneumothorax. Heart size and mediastinal contours are within normal limits. No effusion. Visualized bones unremarkable. IMPRESSION: No acute cardiopulmonary disease. Electronically Signed   By: Corlis Leak M.D.   On: 08/23/2023 14:09   ECHOCARDIOGRAM COMPLETE Result Date: 08/22/2023    ECHOCARDIOGRAM REPORT   Patient Name:   THEODORE RAHRIG Date of Exam: 08/22/2023 Medical Rec #:  [id number]        Height:       70.0 in Accession #:    [id number]       Weight:       265.0 lb Date of Birth:  Dec 06, 1975         BSA:          2.353 m Patient Age:    47 years         BP:           155/92 mmHg Patient Gender: F                HR:           108 bpm. Exam Location:  Inpatient Procedure: 2D Echo, Color Doppler and Cardiac Doppler (Both Spectral and Color            Flow Doppler were utilized during procedure). Indications:    I50.21 Acute systolic (congestive) heart failure  History:        Patient has no prior history of Echocardiogram examinations.                 CHF; Risk Factors:Hypertension.  Sonographer:    Irving Burton Senior RDCS Referring Phys: [id number] SUBRINA SUNDIL  Sonographer Comments: Suboptimal parasternal window due to URI IMPRESSIONS  1. Left ventricular ejection fraction, by  estimation, is 50 to 55%. The left ventricle has low normal function. The left ventricle has no regional wall motion abnormalities. There is mild concentric left ventricular hypertrophy. Indeterminate diastolic filling due to E-A fusion.  2. Right ventricular systolic  function is normal. The right ventricular size is normal. Tricuspid regurgitation signal is inadequate for assessing PA pressure.  3. Left atrial size was mildly dilated.  4. The mitral valve is normal in structure. Trivial mitral valve regurgitation.  5. The aortic valve is tricuspid. There is mild calcification of the aortic valve. Aortic valve regurgitation is trivial.  6. The inferior vena cava is normal in size with <50% respiratory variability, suggesting right atrial pressure of 8 mmHg. FINDINGS  Left Ventricle: Left ventricular ejection fraction, by estimation, is 50 to 55%. The left ventricle has low normal function. The left ventricle has no regional wall motion abnormalities. The left ventricular internal cavity size was normal in size. There is mild concentric left ventricular hypertrophy. Indeterminate diastolic filling due to E-A fusion. Right Ventricle: The right ventricular size is normal. No increase in right ventricular wall thickness. Right ventricular systolic function is normal. Tricuspid regurgitation signal is inadequate for assessing PA pressure. Left Atrium: Left atrial size was mildly dilated. Right Atrium: Right atrial size was normal in size. Pericardium: There is no evidence of pericardial effusion. Mitral Valve: The mitral valve is normal in structure. Trivial mitral valve regurgitation. Tricuspid Valve: The tricuspid valve is normal in structure. Tricuspid valve regurgitation is trivial. Aortic Valve: The aortic valve is tricuspid. There is mild calcification of the aortic valve. Aortic valve regurgitation is trivial. Pulmonic Valve: The pulmonic valve was normal in structure. Pulmonic valve regurgitation is trivial. Aorta: The ascending aorta was not well visualized and the aortic root is normal in size and structure. Venous: The inferior vena cava is normal in size with less than 50% respiratory variability, suggesting right atrial pressure of 8 mmHg. IAS/Shunts: No atrial level  shunt detected by color flow Doppler.  LEFT VENTRICLE PLAX 2D LVIDd:         5.00 cm LVIDs:         3.50 cm LV PW:         1.20 cm LV IVS:        1.10 cm LVOT diam:     2.30 cm LV SV:         91 LV SV Index:   39 LVOT Area:     4.15 cm  LV Volumes (MOD) LV vol d, MOD A2C: 122.0 ml LV vol d, MOD A4C: 152.0 ml LV vol s, MOD A2C: 51.0 ml LV vol s, MOD A4C: 69.8 ml LV SV MOD A2C:     71.0 ml LV SV MOD A4C:     152.0 ml LV SV MOD BP:      75.4 ml RIGHT VENTRICLE RV S prime:     11.70 cm/s TAPSE (M-mode): 1.9 cm LEFT ATRIUM             Index        RIGHT ATRIUM           Index LA diam:        3.90 cm 1.66 cm/m   RA Area:     17.80 cm LA Vol (A2C):   69.9 ml 29.71 ml/m  RA Volume:   56.60 ml  24.06 ml/m LA Vol (A4C):   86.5 ml 36.77 ml/m LA Biplane Vol: 80.3 ml 34.13 ml/m  AORTIC VALVE LVOT  Vmax:   112.00 cm/s LVOT Vmean:  88.000 cm/s LVOT VTI:    0.218 m  AORTA Ao Root diam: 3.00 cm  SHUNTS Systemic VTI:  0.22 m Systemic Diam: 2.30 cm Arvilla Meres MD Electronically signed by Arvilla Meres MD Signature Date/Time: 08/22/2023/1:32:24 PM    Final     EKG Interpretation Date/Time:  Tuesday Prem Coykendall 01 2025 21:17:00 EDT Ventricular Rate:  104 PR Interval:  122 QRS Duration:  92 QT Interval:  382 QTC Calculation: 502 R Axis:   -17  Text Interpretation: Sinus tachycardia Minimal voltage criteria for LVH, may be normal variant ( Cornell product ) Confirmed by Nicanor Alcon, Aadit Hagood ([id number]) on 09/21/2023 1:14:27 AM  Radiology CT Angio Chest PE W and/or Wo Contrast Result Date: 09/21/2023 CLINICAL DATA:  Chest pain, syncope/presyncope, PE suspected EXAM: CT ANGIOGRAPHY CHEST WITH CONTRAST TECHNIQUE: Multidetector CT imaging of the chest was performed using the standard protocol during bolus administration of intravenous contrast. Multiplanar CT image reconstructions and MIPs were obtained to evaluate the vascular anatomy. RADIATION DOSE REDUCTION: This exam was performed according to the departmental dose-optimization  program which includes automated exposure control, adjustment of the mA and/or kV according to patient size and/or use of iterative reconstruction technique. CONTRAST:  75 mL Omnipaque 350 COMPARISON:  Chest radiograph 09/20/2023; CTA chest 08/22/2023 FINDINGS: Cardiovascular: Respiratory motion obscures the segmental and subsegmental pulmonary arteries. No central pulmonary embolism. No pericardial effusion. Normal caliber thoracic aorta. Cardiomegaly. Mediastinum/Nodes: Bowing of the posterior trachea compatible with expiratory phase. Esophagus is unremarkable. Similar shotty mediastinal and hilar lymph nodes. Lungs/Pleura: Respiratory motion obscures fine detail. Diffuse bronchial wall and interlobular septal thickening. Patchy ground-glass opacities greater Sten the lower lungs. Atelectasis along the right minor fissure. No pleural effusion or pneumothorax. Upper Abdomen: Bilateral adrenal nodules are partially visualized and were previously characterized as benign on 02/17/2023 CT abdomen pelvis. No follow-up recommended. Partially visualized left renal cysts. No follow-up recommended. Musculoskeletal: No acute fracture. Unchanged 2 cm cystic lesion in the medial left upper chest likely a sebaceous cyst. Review of the MIP images confirms the above findings. IMPRESSION: 1. Respiratory motion obscures the segmental and subsegmental pulmonary arteries. No central pulmonary embolism. 2. Cardiomegaly with pulmonary edema. Electronically Signed   By: Minerva Fester M.D.   On: 09/21/2023 04:00   DG Chest 2 View Result Date: 09/20/2023 CLINICAL DATA:  Chest pain. EXAM: CHEST - 2 VIEW COMPARISON:  Chest recommended 08/25/2023. FINDINGS: Cardiomegaly with vascular congestion. Linear density along the minor fusion may represent atelectasis or small pleural effusion. Infiltrate is not excluded. No pneumothorax. No acute osseous pathology. IMPRESSION: 1. Cardiomegaly with vascular congestion. 2. Atelectasis versus small  pleural effusion along the minor fissure. Electronically Signed   By: Elgie Collard M.D.   On: 09/20/2023 21:51    Procedures Procedures    Medications Ordered in ED Medications  albuterol (PROVENTIL) (2.5 MG/3ML) 0.083% nebulizer solution 2.5 mg (2.5 mg Nebulization Given 09/20/23 2355)  albuterol (PROVENTIL) (2.5 MG/3ML) 0.083% nebulizer solution 5 mg (5 mg Nebulization Given 09/21/23 0255)  ipratropium (ATROVENT) nebulizer solution 0.5 mg (0.5 mg Nebulization Given 09/21/23 0310)  methylPREDNISolone sodium succinate (SOLU-MEDROL) 125 mg/2 mL injection 125 mg (125 mg Intravenous Given 09/21/23 0257)  iohexol (OMNIPAQUE) 350 MG/ML injection 75 mL (75 mLs Intravenous Contrast Given 09/21/23 0351)  furosemide (LASIX) injection 40 mg (40 mg Intravenous Given 09/21/23 0517)  nitroGLYCERIN (NITROGLYN) 2 % ointment 0.5 inch (0.5 inches Topical Given 09/21/23 0517)  aspirin chewable tablet 324 mg (324 mg Oral  Given 09/21/23 0602)    ED Course/ Medical Decision Making/ A&P                                 Medical Decision Making Patient with worsening SOB.    Amount and/or Complexity of Data Reviewed External Data Reviewed: notes.    Details: Previous notes reviewed  Labs: ordered.    Details: Normal white count 7.2, normal hemoglobin 12.6, normal platelets. Normal sodium 142, normal potassium.  Normal creatinine 0.7.  2 elevated troponins 32/32  Radiology: ordered and independent interpretation performed.    Details: Pulmonary edema by me  ECG/medicine tests: ordered and independent interpretation performed. Decision-making details documented in ED Course.  Risk OTC drugs. Prescription drug management. Decision regarding hospitalization.    Final Clinical Impression(s) / ED Diagnoses Final diagnoses:  Acute pulmonary edema (HCC)  Elevated troponin I level  Shortness of breath  Elevated blood pressure reading   The patient appears reasonably stabilized for admission considering the  current resources, flow, and capabilities available in the ED at this time, and I doubt any other [facility] requiring further screening and/or treatment in the ED prior to admission.  Rx / DC Orders ED Discharge Orders     None         Laine Giovanetti, MD 09/21/23 0630

## 2023-09-21 NOTE — Discharge Instructions (Signed)

## 2023-09-21 NOTE — Progress Notes (Signed)
 Patient's blood pressure is consistently high, complaint of frontal headache, patient is agitated and yelling out. Informed to the MD, ordered medications provided, PRN Tynolol given for headache, She refused Nicotine patch.

## 2023-09-21 NOTE — H&P (Deleted)
 History and Physical    Patient: Julie [name] NWG:[id number] DOB: 02-13-1976 DOA: 09/20/2023 DOS: the patient was seen and examined on 09/21/2023 PCP: Pcp, No  Patient coming from: Homeless  Medical readiness/disposition: Patient potentially could be ready for discharge by 09/22/2023.  Discharge needs: Patient will need to have discharge medications filled by our [facility] pharmacy.  Due to patient's homeless status she only had medications that she obtained from the ED on initial date of discharge.  It is noted she returned to the ED because she was discharged (per her report) after her pharmacy closed.  She does have healthcare insurance.  Patient will also need a glucometer and giving recurrent episodes of bronchitis and asthma exacerbation requiring hospitalization over the past 45 days it is anticipated she will need a nebulizer machine with medications at time of discharge as well.  She also has no PCP and this will need to be arranged prior to discharge.  Chief Complaint:  Chief Complaint  Patient presents with   Chest Pain   HPI: Julie [name] is a 48 y.o. female with medical history significant of new diagnosis of diabetes, HFpEF, and uncontrolled hypertension during previous admission.  She also has a documented history of nonadherence to medications and substance abuse with her admitting to using of cocaine previously and positive urine drug screen.  She was just discharged on 08/25/2023 after being admitted for asthma exacerbation, exacerbation of major depressive disorder, new onset heart failure and progressive and worsened hypertension.  In that admission her echo was inconsistent regarding definitive diastolic dysfunction but she was found to have LVH.  She was started on metformin that admission and was discharged on as needed albuterol MDI and steroid taper.  At home she had apparently been on Lasix.  Her amlodipine was discontinued and focus was given to utilization of losartan alone.     She presented to the ED on 4/1 reporting central chest pain and shortness of breath for 4 days with pleuritic sounding chest discomfort with deep breathing.  Plain chest x-ray revealed cardiomegaly with vascular congestion.  Given her significant chest pain a CTA of the chest was also obtained to rule out PE.  No PE was detected but there was cardiomegaly with pulmonary edema.  She was given 1 dose of IV Lasix, and she was also given multiple albuterol nebs as well as a dose of IV Solu-Medrol.  Because her blood pressure was elevated she was given a dose of Nitropaste.  She has subsequently diuresed 800 cc of urine since the Lasix and BP has improved.  Unfortunately after steroids her sugars have increased to near 400 range.  Hospital service has been asked to evaluate the patient for admission.   Review of Systems: As mentioned in the history of present illness. All other systems reviewed and are negative. 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 She has a 9 pack-year smoking history. She has never used smokeless  tobacco. She reports current alcohol use. She reports current drug use. Drugs: Marijuana, "Crack" cocaine, and MDMA (Ecstacy).  No Known Allergies  History reviewed. No pertinent family history.  Prior to Admission medications   Medication Sig Start Date End Date Taking? Authorizing Provider  albuterol (VENTOLIN HFA) 108 (90 Base) MCG/ACT inhaler Inhale 2 puffs into the lungs every 6 (six) hours as needed for wheezing or shortness of breath. 08/25/23   Dezii, Alexandra, DO  losartan (COZAAR) 50 MG tablet Take 1 tablet (50 mg total) by mouth daily. 08/26/23 11/24/23  Debarah Crape, DO  metFORMIN (GLUCOPHAGE) 500 MG tablet Take 1 tablet (500 mg total) by mouth 2 (two) times daily with a meal. 08/25/23 09/24/23  Dezii, Alexandra, DO  metoprolol tartrate (LOPRESSOR) 25 MG tablet Take 1 tablet (25 mg total) by mouth 2 (two) times daily. 08/25/23 11/23/23  Dezii, Alexandra, DO  pantoprazole (PROTONIX) 40 MG tablet Take 1 tablet (40 mg total) by mouth daily. 08/26/23   Debarah Crape, DO    Physical Exam: Vitals:   09/21/23 0135 09/21/23 0237 09/21/23 0620 09/21/23 0739  BP: (!) 211/115 (!) 183/123 (!) 178/98   Pulse: (!) 101 (!) 108 92   Resp: 18  (!) 25   Temp:    98.4 F (36.9 C)  TempSrc:    Oral  SpO2: 96% 94% 100%   Weight:      Height:       Constitutional: NAD, calm, comfortable Respiratory: Diffuse bilateral wheezing on posterior exam.  Normal respiratory effort at rest although does become slightly tachypneic with phonation. No accessory muscle use.  Chest pain is reproducible with palpation.  Room air saturations 100% Cardiovascular: Regular rate and rhythm, no murmurs / rubs / gallops. No extremity edema. 2+ pedal pulses.  Remains slightly tachycardic and somewhat hypertensive prior to administration of newly ordered medications. Abdomen: no tenderness, no masses palpated. No hepatosplenomegaly. Bowel sounds positive.  Musculoskeletal: no clubbing / cyanosis. No joint deformity upper and  lower extremities. Good ROM, no contractures. Normal muscle tone.  Skin: no rashes, lesions, ulcers. No induration Neurologic: CN 2-12 grossly intact. Sensation intact, DTR normal. Strength 5/5 x all 4 extremities.  Psychiatric: Normal judgment and insight. Alert and oriented x 3. Normal mood.     Data Reviewed:  Sodium 142, potassium 3.5, glucose 208, BUN 8, creatinine 0.7  TNI 32 and 33  White count 7200, hemoglobin 12.6 platelets 242,000, differential normal  Serum pregnancy negative  Chest x-ray and CT angio of chest as above  EKG: Sinus tachycardia with voltage criteria met for LVH, QTc slightly elevated at 502 ms  Assessment and Plan: COPD versus asthma exacerbation.  Acute respiratory failure with hypoxia ruled out. Patient presented with atypical chest discomfort and shortness of breath ongoing for 4 days and typical to prior presentation Patient admits to recent use of cocaine as well as continued smoking Symptoms improving after administration of nebulizers and steroids.  Plan to utilize prednisone as opposed to IV Solu-Medrol and watch glucose closely Initiate scheduled DuoNebs every 6 hours along with  budesonide nebs every 12 hours No indication to utilize antibiotics noting no constitutional symptoms  Mild flash pulmonary edema Mild pulmonary edema noted on both chest x-ray and CT of chest Likely secondary to uncontrolled hypertension prior to presentation Appears euvolemic now after receiving 1 dose of IV Lasix in the ER with 800 cc urine output documented therefore will not order additional doses of diuretic  Uncontrolled hypertension Patient admits to nonadherence to prescribed medications.  She reports that due to her homeless situation she did not prioritize obtaining her medications. I have requested that at time of discharge patient have her meds filled through our [facility] and delivered to her room prior to discharge.  She does have  insurance. We have resumed her Norvasc, stopped her beta-blocker in context of cocaine use, and have restarted her Cozaar. Provide IV Apresoline available for breakthrough hypertension  HFpEF Currently euvolemic. Recent echo less than 30 days ago revealed LVH with indeterminate diastolic parameters but overall picture consistent with heart failure w/ preserved ejection fraction Discussed with patient the importance of maintaining appropriate blood pressure control to prevent further heart pump/heart failure issues  Mildly elevated troponin/atypical chest discomfort Also similar to recent presentation Troponin trend is flat and EKG unremarkable in regards to ischemic features  Polysubstance abuse Admits to utilization of tobacco and cocaine Patient counseled regarding cessation of both especially cocaine in context of worsening hypertension and increased risk for stroke or other cardiovascular anomaly  Uncontrolled diabetes 2 This is apparently a new diagnosis for this patient.  Her initial hemoglobin's A1c was 8.5 Newly prescribed metformin currently on hold since patient just received IV contrast for CTA of the chest.  Of note patient did not have this medication filled after discharge. Patient also does not have a glucometer and this will need to be prescribed at discharge Discussed that patient may need to have insulin started to improve glycemic control. While hospitalized we will follow CBGs and provide SSI Patient reports that multiple family members have diabetes with complications including need for dialysis and amputation of extremities  Major depressive disorder/homelessness Not on medications for depression prior to admission    Advance Care Planning:   Code Status: Full Code   VTE prophylaxis: Lovenox  Consults: None  Family Communication: Patient only  Severity of Illness: The appropriate patient status for this patient is OBSERVATION. Observation status is judged  to be reasonable and necessary in order to provide the required intensity of service to ensure the patient's safety. The patient's presenting symptoms, physical exam findings, and initial radiographic and laboratory data in the context of their medical condition is felt to place them at decreased risk for further clinical deterioration. Furthermore, it is anticipated that the patient will be medically stable for discharge from the hospital within 2 midnights of admission.   Author: Junious Silk, NP 09/21/2023 9:26 AM  For on call review [email or web address].

## 2023-09-21 NOTE — H&P (Signed)
 History and Physical      Patient: Julie [name] YNW:[id number] DOB: 08-02-1975 DOA: 09/20/2023 DOS: the patient was seen and examined on 09/21/2023 PCP: Pcp, No  Patient coming from: Homeless   Medical readiness/disposition: Patient potentially could be ready for discharge by 09/22/2023.   Discharge needs: Patient will need to have discharge medications filled by our [facility] pharmacy.  Due to patient's homeless status she only had medications that she obtained from the ED on initial date of discharge.  It is noted she returned to the ED because she was discharged (per her report) after her pharmacy closed.  She does have healthcare insurance.  Patient will also need a glucometer and giving recurrent episodes of bronchitis and asthma exacerbation requiring hospitalization over the past 45 days it is anticipated she will need a nebulizer machine with medications at time of discharge as well.  She also has no PCP and this will need to be arranged prior to discharge.   Chief Complaint:     Chief Complaint  Patient presents with   Chest Pain    HPI: Julie [name] is a 48 y.o. female with medical history significant of new diagnosis of diabetes, HFpEF, and uncontrolled hypertension during previous admission.  She also has a documented history of nonadherence to medications and substance abuse with her admitting to using of cocaine previously and positive urine drug screen.  She was just discharged on 08/25/2023 after being admitted for asthma exacerbation, exacerbation of major depressive disorder, new onset heart failure and progressive and worsened hypertension.  In that admission her echo was inconsistent regarding definitive diastolic dysfunction but she was found to have LVH.  She was started on metformin that admission and was discharged on as needed albuterol MDI and steroid taper.  At home she had apparently been on Lasix.  Her amlodipine was discontinued and focus was given to utilization of  losartan alone.     She presented to the ED on 4/1 reporting central chest pain and shortness of breath for 4 days with pleuritic sounding chest discomfort with deep breathing.  Plain chest x-ray revealed cardiomegaly with vascular congestion.  Given her significant chest pain a CTA of the chest was also obtained to rule out PE.  No PE was detected but there was cardiomegaly with pulmonary edema.  She was given 1 dose of IV Lasix, and she was also given multiple albuterol nebs as well as a dose of IV Solu-Medrol.  Because her blood pressure was elevated she was given a dose of Nitropaste.  She has subsequently diuresed 800 cc of urine since the Lasix and BP has improved.  Unfortunately after steroids her sugars have increased to near 400 range.  Hospital service has been asked to evaluate the patient for admission.     Review of Systems: As mentioned in the history of present illness. All other systems reviewed and are negative.     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 She has a 9  pack-year smoking history. She has never used smokeless tobacco. She reports current alcohol use. She reports current drug use. Drugs: Marijuana, "Crack" cocaine, and MDMA (Ecstacy).   Allergies  No Known Allergies     History reviewed. No pertinent family history.              Prior to Admission medications   Medication Sig Start Date End Date Taking? Authorizing Provider  albuterol (VENTOLIN HFA) 108 (90 Base) MCG/ACT inhaler Inhale 2 puffs into the lungs every 6 (six) hours as needed for wheezing or shortness of breath. 08/25/23     Dezii, Alexandra, DO  losartan (COZAAR) 50 MG tablet Take 1 tablet (50 mg total) by mouth daily. 08/26/23 11/24/23   Debarah Crape, DO  metFORMIN (GLUCOPHAGE) 500 MG tablet Take 1 tablet (500 mg total) by mouth 2 (two) times daily with a meal. 08/25/23 09/24/23   Dezii, Alexandra, DO  metoprolol tartrate (LOPRESSOR) 25 MG tablet Take 1 tablet (25 mg total) by mouth 2 (two) times daily. 08/25/23 11/23/23   Dezii, Alexandra, DO  pantoprazole (PROTONIX) 40 MG tablet Take 1 tablet (40 mg total) by mouth daily. 08/26/23     Debarah Crape, DO      Physical Exam:       Vitals:    09/21/23 0135 09/21/23 0237 09/21/23 0620 09/21/23 0739  BP: (!) 211/115 (!) 183/123 (!) 178/98    Pulse: (!) 101 (!) 108 92    Resp: 18   (!) 25    Temp:       98.4 F (36.9 C)  TempSrc:       Oral  SpO2: 96% 94% 100%    Weight:          Height:            Constitutional: NAD, calm, comfortable Respiratory: Diffuse bilateral wheezing on posterior exam.  Normal respiratory effort at rest although does become slightly tachypneic with phonation. No accessory muscle use.  Chest pain is reproducible with palpation.  Room air saturations 100% Cardiovascular: Regular rate and rhythm, no murmurs / rubs / gallops. No extremity edema. 2+ pedal pulses.  Remains slightly tachycardic and somewhat hypertensive prior to administration of newly ordered medications. Abdomen: no tenderness, no masses  palpated. No hepatosplenomegaly. Bowel sounds positive.  Musculoskeletal: no clubbing / cyanosis. No joint deformity upper and lower extremities. Good ROM, no contractures. Normal muscle tone.  Skin: no rashes, lesions, ulcers. No induration Neurologic: CN 2-12 grossly intact. Sensation intact, DTR normal. Strength 5/5 x all 4 extremities.  Psychiatric: Normal judgment and insight. Alert and oriented x 3. Normal mood.       Data Reviewed:   Sodium 142, potassium 3.5, glucose 208, BUN 8, creatinine 0.7   TNI 32 and 33   White count 7200, hemoglobin 12.6 platelets 242,000, differential normal   Serum pregnancy  negative   Chest x-ray and CT angio of chest as above   EKG: Sinus tachycardia with voltage criteria met for LVH, QTc slightly elevated at 502 ms   Assessment and Plan: COPD versus asthma exacerbation.  Acute respiratory failure with hypoxia ruled out. Patient presented with atypical chest discomfort and shortness of breath ongoing for 4 days and typical to prior presentation Patient admits to recent use of cocaine as well as continued smoking Symptoms improving after administration of nebulizers and steroids.  Plan to utilize prednisone as opposed to IV Solu-Medrol and watch glucose closely Initiate scheduled DuoNebs every 6 hours along with budesonide nebs every 12 hours No indication to utilize antibiotics noting no constitutional symptoms   Mild flash pulmonary edema Mild pulmonary edema noted on both chest x-ray and CT of chest Likely secondary to uncontrolled hypertension prior to presentation Appears euvolemic now after receiving 1 dose of IV Lasix in the ER with 800 cc urine output documented therefore will not order additional doses of diuretic   Uncontrolled hypertension Patient admits to nonadherence to prescribed medications.  She reports that due to her homeless situation she did not prioritize obtaining her medications. I have requested that at time of discharge  patient have her meds filled through our [facility] pharmacy and delivered to her room prior to discharge.  She does have insurance. We have resumed her Norvasc, stopped her beta-blocker in context of cocaine use, and have restarted her Cozaar. Provide IV Apresoline available for breakthrough hypertension   HFpEF Currently euvolemic. Recent echo less than 30 days ago revealed LVH with indeterminate diastolic parameters but overall picture consistent with heart failure w/ preserved ejection fraction Discussed with patient the importance of maintaining appropriate blood pressure control to prevent further heart pump/heart failure issues   Mildly elevated troponin/atypical chest discomfort Also similar to recent presentation Troponin trend is flat and EKG unremarkable in regards to ischemic features   Polysubstance abuse Admits to utilization of tobacco and cocaine Patient counseled regarding cessation of both especially cocaine in context of worsening hypertension and increased risk for stroke or other cardiovascular anomaly   Uncontrolled diabetes 2 This is apparently a new diagnosis for this patient.  Her initial hemoglobin's A1c was 8.5 Newly prescribed metformin currently on hold since patient just received IV contrast for CTA of the chest.  Of note patient did not have this medication filled after discharge. Patient also does not have a glucometer and this will need to be prescribed at discharge Discussed that patient may need to have insulin started to improve glycemic control. While hospitalized we will follow CBGs and provide SSI Patient reports that multiple family members have diabetes with complications including need for dialysis and amputation of extremities   Major depressive disorder/homelessness Not on medications for depression prior to admission       Advance Care Planning:   Code Status: Full Code    VTE prophylaxis: Lovenox   Consults: None   Family  Communication: Patient only   Severity of Illness: The appropriate patient status for this patient is OBSERVATION. Observation status is judged to be reasonable and necessary in order to provide the required intensity of service to ensure the patient's safety. The patient's presenting symptoms, physical exam findings, and initial radiographic and laboratory data in the context of their medical condition is felt to place them at decreased risk for further clinical deterioration. Furthermore, it is anticipated that the patient will be medically stable for discharge from the hospital within  2 midnights of admission.     Author: Junious Silk, NP 09/21/2023 11:31 AM  For on call review [email or web address].

## 2023-09-21 NOTE — Progress Notes (Addendum)
 Patient arrived in the unit from ED, she is alert and oriented *4, V/S obtained, CCMD notified, CHG  bath given,call bell in reach, will continue to monitor.  Informed to the MD about her high BP, meds given as per the order.   09/21/23 1502  Vitals  Temp 98.4 F (36.9 C)  Temp Source Oral  BP (!) 184/102  MAP (mmHg) 125  BP Location Right Arm  BP Method Automatic  Patient Position (if appropriate) Lying  Pulse Rate (!) 103  Pulse Rate Source Monitor  ECG Heart Rate (!) 103  Resp 20  Level of Consciousness  Level of Consciousness Alert  MEWS COLOR  MEWS Score Color Green  Oxygen Therapy  SpO2 96 %  O2 Device Room Air  Pain Assessment  Pain Scale 0-10  Pain Score 0  MEWS Score  MEWS Temp 0  MEWS Systolic 0  MEWS Pulse 1  MEWS RR 0  MEWS LOC 0  MEWS Score 1

## 2023-09-21 NOTE — ED Notes (Signed)
 Provider at nursing station, made aware if CBG over 400. Verbal order to give max sliding scale dose per her EMAR.

## 2023-09-21 NOTE — Inpatient Diabetes Management (Signed)
 Inpatient Diabetes Program Recommendations  AACE/ADA: New Consensus Statement on Inpatient Glycemic Control (2015)  Target Ranges:  Prepandial:   less than 140 mg/dL      Peak postprandial:   less than 180 mg/dL (1-2 hours)      Critically ill patients:  140 - 180 mg/dL   Lab Results  Component Value Date   GLUCAP 344 (H) 09/21/2023   HGBA1C 8.5 (H) 08/22/2023    Review of Glycemic Control  Latest Reference Range & Units 09/21/23 07:49 09/21/23 11:30  Glucose-Capillary 70 - 99 mg/dL 604 (H) 540 (H)   Diabetes history: DM 2 Outpatient Diabetes medications: metformin 500 mg bid Current orders for Inpatient glycemic control:  Novolog 0-15 units tid + hs  PO prednisone 40 mg Daily A1c 8.5% on 3/3  Inpatient Diabetes Program Recommendations:    -   Consider adding Novolog 4 units tid meal coverage tid If eating >50% of meals while on steroids  Thanks,  Christena Deem RN, MSN, BC-ADM Inpatient Diabetes Coordinator Team Pager [phone] (8a-5p)

## 2023-09-21 NOTE — Progress Notes (Signed)
 I have taken an interval history, reviewed the chart and examined the patient. I agree with the Advanced Practice Provider's note, impression and recommendations. I have made any necessary editorial changes.   48 year old female, reportedly homeless and moves from hotel to hotel with her boyfriend, panhandles, states that she does not have a PCP, since last hospital discharge in March, reportedly did not take any of the discharge meds except Tessalon from a prior prescription and albuterol inhaler, extensive medical history including polysubstance abuse (ongoing tobacco, cocaine, THC use), poorly controlled HTN, asthma/COPD, depression, HFpEF, newly diagnosed DM, presented to ED on 4/1 with 5-day history of progressive DOE, dry cough, chest pain initially on coughing followed by chest pain on deep breathing, symptoms progressively got worse the day prior to admission when she reported inability to sleep flat because of dyspnea and associated wheezing.  Albuterol inhaler did not help much.   In ED, initial BP of 187/118, pulse rate of 106, transient tachypnea in the 20s without hypoxia.  BMP only remarkable for hyperglycemia/208 mg per DL, HS Troponin x 2: 32 > 33 inconsistent with ACS, normal CBC, serum pregnancy test negative, CTA chest negative for PE but shows cardiomegaly with pulmonary edema.  She was treated with a dose of IV Lasix 40 mg x 1, IV Solu-Medrol 125 mg x 1, sublingual nitroglycerin with some improvement in symptoms.   Subjective: When seen in ED, reported that her dyspnea and chest pain were slightly better.  Rest of history as noted above   Objective: Young female, moderately built and obese lying comfortably supine in bed without distress, painful or respiratory       Vitals:    09/21/23 0620 09/21/23 0739 09/21/23 1032 09/21/23 1059  BP: (!) 178/98   (!) 172/90 (!) 189/107  Pulse: 92   92 (!) 107  Resp: (!) 25   (!) 23 20  Temp:   98.4 F (36.9 C) 98 F (36.7 C)    TempSrc:    Oral      SpO2: 100%   100% 96%  Weight:          Height:            Respiratory system: Occasional bilateral upper anterior chest wheezing but rest of lung fields clear to auscultation.  Questionable reproducible mid upper anterior chest wall pain.  Although telemetry ordered, was not on telemetry when seen this morning.  No increased work of breathing CVS: S1 and S2 heard, RRR.  No JVD, murmurs or pedal edema. Abdomen: Nondistended/obese, soft and nontender.  No organomegaly or masses appreciated.  Normal bowel sounds heard. CNS: Alert and oriented x 3.  No focal neurological deficits Extremities: Symmetric 5 x 5 power.   Assessment and plan:   Asthma/COPD exacerbation: May have had an element of flash pulmonary edema related to hypertensive urgency but post IV Lasix 40 mg x 1, clinically appears euvolemic.  Can reassess in a.m. regarding need for further diuretics. If she has not had formal testing done, needs to have PFT as outpatient-discussed with patient who verbalizes understanding S/p IV Solu-Medrol 125 mg x 1, switch to prednisone 40 mg daily DuoNebs every 6 hours, as needed albuterol nebs Flutter valve and incentive spirometry Substance abuse cessation counseling done extensively. Does not meet criteria for acute respiratory failure with hypoxia in the absence of hypoxia.   Hypertensive urgency: Likely precipitated by medication nonadherence and recent cocaine use Given ongoing cocaine use, hesitant to use beta-blockers, discontinued metoprolol Continue losartan  50 Mg daily, resumed amlodipine 5 Mg daily which was discontinued during last admission and this can be titrated up As needed IV hydralazine   Atypical chest pain: Suspect due to asthma/COPD exacerbation and may have an element of demand ischemia but clinically does not appear to have ACS. CTA chest negative for PE. HS Troponin is minimally elevated with flat trend Recent echo with normal LVEF, no reason to  repeat.   Polysubstance abuse (tobacco, cocaine, THC) Extensively counseled regarding cessation of all.  She verbalized understanding TOC consulted for cessation counseling as well.   Uncontrolled type II DM with hyperglycemia, in obese Recent A1c 8.5 Current worsening hyperglycemia related to steroids, will have to use briefly and taper quickly Hold metformin Will need DM supplies at DC Moderate SSI, may need to titrate up and even consider long-acting insulin safe persist despite changing IV Solu-Medrol to lower dose oral prednisone   Homelessness TOC consulted for assistance   Depression Does not appear to be on any meds PTA.     Time spent: Greater than 30 minutes.

## 2023-09-22 ENCOUNTER — Other Ambulatory Visit (HOSPITAL_COMMUNITY): Payer: Self-pay

## 2023-09-22 DIAGNOSIS — J441 Chronic obstructive pulmonary disease with (acute) exacerbation: Secondary | ICD-10-CM | POA: Diagnosis not present

## 2023-09-22 DIAGNOSIS — I160 Hypertensive urgency: Secondary | ICD-10-CM | POA: Diagnosis not present

## 2023-09-22 DIAGNOSIS — F1910 Other psychoactive substance abuse, uncomplicated: Secondary | ICD-10-CM | POA: Diagnosis not present

## 2023-09-22 LAB — BASIC METABOLIC PANEL WITH GFR
Anion gap: 12 (ref 5–15)
BUN: 10 mg/dL (ref 6–20)
CO2: 23 mmol/L (ref 22–32)
Calcium: 10.1 mg/dL (ref 8.9–10.3)
Chloride: 106 mmol/L (ref 98–111)
Creatinine, Ser: 0.77 mg/dL (ref 0.44–1.00)
GFR, Estimated: >60 mL/min (ref >60)
Glucose, Bld: 139 mg/dL — ABNORMAL HIGH (ref 70–99)
Potassium: 3 mmol/L — ABNORMAL LOW (ref 3.5–5.1)
Sodium: 141 mmol/L (ref 135–145)

## 2023-09-22 LAB — MAGNESIUM: Magnesium: 1.7 mg/dL (ref 1.7–2.4)

## 2023-09-22 LAB — GLUCOSE, CAPILLARY
Glucose-Capillary: 354 mg/dL — ABNORMAL HIGH (ref 70–99)
Glucose-Capillary: 308 mg/dL — ABNORMAL HIGH (ref 70–99)

## 2023-09-22 MED ORDER — INSULIN ASPART 100 UNIT/ML IJ SOLN
3.0000 [IU] | Freq: Three times a day (TID) | INTRAMUSCULAR | Status: DC
  Administered 2023-09-22: 3 [IU] via SUBCUTANEOUS

## 2023-09-22 MED ORDER — INSULIN GLARGINE-YFGN 100 UNIT/ML ~~LOC~~ SOLN
12.0000 [IU] | Freq: Every day | SUBCUTANEOUS | Status: DC
  Administered 2023-09-22: 12 [IU] via SUBCUTANEOUS
  Filled 2023-09-22: qty 0.12

## 2023-09-22 MED ORDER — CARVEDILOL 3.125 MG PO TABS
3.1250 mg | ORAL_TABLET | Freq: Two times a day (BID) | ORAL | Status: DC
  Administered 2023-09-22: 3.125 mg via ORAL
  Filled 2023-09-22: qty 1

## 2023-09-22 MED ORDER — POTASSIUM CHLORIDE CRYS ER 20 MEQ PO TBCR
40.0000 meq | EXTENDED_RELEASE_TABLET | ORAL | Status: DC
  Administered 2023-09-22: 40 meq via ORAL
  Filled 2023-09-22: qty 4

## 2023-09-22 NOTE — Progress Notes (Signed)
 Pt is requesting to speak to MD regarding her condition, now that she is fully awake. MD notified.  Kenard Gower, RN

## 2023-09-22 NOTE — Discharge Summary (Addendum)
 Physician Discharge Summary  Julie [name] NFA:[id number] DOB: [birth year]  PCP: Pcp, No  Patient left the hospital AGAINST MEDICAL ADVICE  Admitted from: Home Discharged to: Home  Admit date: 09/20/2023 Discharge date: 09/22/2023  Recommendations for Outpatient Follow-up:    Follow-up Information     Salvatore Decent, FNP Follow up.   Specialty: Internal Medicine Why: TIME : 2:00 PM    PLEASE ARRIVE AT  1:30 PM DATE MAY [id number]  PLEASE BRING ALL MEDICATION , ID and INS CARD KNOWLEDGE OF APPT MAILED TO PT'S Contact information: [address] [place] [address] [phone]                  Home Health: N/A    Equipment/Devices: N/A    Discharge Condition: Guarded and at risk for decline including death.  This has been clearly discussed with the patient.   Code Status: Full Code Diet recommendation: N/A    Discharge Diagnoses:  Asthma/COPD exacerbation   Brief Summary: 48 year old female, reportedly homeless and moves from hotel to hotel with her boyfriend, panhandles, states that she does not have a PCP, since last hospital discharge in March, reportedly did not take any of the discharge meds except Tessalon from a prior prescription and albuterol inhaler, extensive medical history including polysubstance abuse (ongoing tobacco, cocaine, THC use), poorly controlled HTN, asthma/COPD, depression, HFpEF, newly diagnosed DM, presented to ED on 4/1 with 5-day history of progressive DOE, dry cough, chest pain initially on coughing followed by chest pain on deep breathing, symptoms progressively got worse the day prior to admission when she reported inability to sleep flat because of dyspnea and associated wheezing.  Albuterol inhaler did not help much.   In ED, initial BP of 187/118, pulse rate of 106, transient tachypnea in the 20s without hypoxia.  BMP only remarkable for hyperglycemia/208 mg per DL, HS Troponin x 2: 32 > 33 inconsistent with ACS, normal CBC,  serum pregnancy test negative, CTA chest negative for PE but shows cardiomegaly with pulmonary edema.  She was treated with a dose of IV Lasix 40 mg x 1, IV Solu-Medrol 125 mg x 1, sublingual nitroglycerin with some improvement in symptoms.  Patient was admitted for suspected asthma/COPD exacerbation, hypertensive urgency in the context of nonadherence to her medications and ongoing cocaine use, polysubstance abuse and uncontrolled DM.  Clinically she had made some improvement today but still had issues with significant intermittent hypertension and dyspnea 50% better.  Plan was to continue her steroids, nebulizers etc. for COPD, titrate HTN and DM meds, closely monitor her overnight, arrange for PCP follow-up and discharge meds (had discussed with TOC) prior to possible safe discharge tomorrow.  However patient earlier this afternoon insisted on leaving AGAINST MEDICAL ADVICE.  I personally called her on the phone and spoke with her in great detail.  I warned her that if she is not appropriately and adequately treated, she is at extremely high risk of recurrent medical decline which could potentially lead to short and long-term complications and even death.  Despite this, without giving any clear reasons, she did not even wait for formal DC, signed AMA paperwork and left.  Patient has medical decision making capacity but is making poor judgment.  She was advised to seek immediate medical attention as needed.  Assessment and plan:   Asthma/COPD exacerbation: May have had an element of flash pulmonary edema related to hypertensive urgency but post IV Lasix 40 mg x 1, clinically appears euvolemic.  Remained euvolemic during  exam this morning. If she has not had formal testing done, needs to have PFT as outpatient-discussed with patient who verbalizes understanding.  Reiterated this to her. S/p IV Solu-Medrol 125 mg x 1, switched to prednisone 40 mg daily DuoNebs every 6 hours, as needed albuterol  nebs Flutter valve and incentive spirometry Substance abuse cessation counseling done extensively. Does not meet criteria for acute respiratory failure with hypoxia in the absence of hypoxia. Slowly improving.   Hypertensive urgency: Likely precipitated by medication nonadherence and recent cocaine use Given ongoing cocaine use, discontinued metoprolol yesterday and after extensive discussion with pharmacy today, started nonselective beta-blocker/carvedilol low-dose in addition to losartan 50 Mg daily, amlodipine 10 Mg daily. Was also on as needed IV hydralazine and as needed oral clonidine Even earlier this morning, had significantly high blood pressure of 138/111.  Atypical chest pain: Suspect due to asthma/COPD exacerbation and may have an element of demand ischemia but clinically does not appear to have ACS. CTA chest negative for PE. HS Troponin is minimally elevated with flat trend Recent echo with normal LVEF, no reason to repeat. Resolved since yesterday.   Polysubstance abuse (tobacco, cocaine, THC) Extensively counseled regarding cessation of all.  She verbalized understanding TOC consulted for cessation counseling as well. UDS expectedly positive for cocaine and THC.   Uncontrolled type II DM with hyperglycemia, in obese Recent A1c 8.5 on 08/22/2023. Current worsening hyperglycemia related to steroids, will have to use briefly and taper quickly Hold metformin Will need DM supplies at DC As communicated with diabetes coordinator today, initiated Semglee 12 units daily, NovoLog 3 units 3 times daily with meals and continued SSI.  They recommended continuing metformin and may be adding low-dose on metal at discharge.   Homelessness TOC consulted for assistance.  Did discuss with TOC this morning.   Depression Does not appear to be on any meds PTA.  Hypokalemia Patient left AMA after taking 1 of 2 doses of potassium supplements that had been ordered.  Magnesium  1.7.   Consultations: None  Procedures: None   Discharge Instructions  N/A. Left AMA  No Known Allergies    Procedures/Studies: CT Angio Chest PE W and/or Wo Contrast Result Date: 09/21/2023 CLINICAL DATA:  Chest pain, syncope/presyncope, PE suspected EXAM: CT ANGIOGRAPHY CHEST WITH CONTRAST TECHNIQUE: Multidetector CT imaging of the chest was performed using the standard protocol during bolus administration of intravenous contrast. Multiplanar CT image reconstructions and MIPs were obtained to evaluate the vascular anatomy. RADIATION DOSE REDUCTION: This exam was performed according to the departmental dose-optimization program which includes automated exposure control, adjustment of the mA and/or kV according to patient size and/or use of iterative reconstruction technique. CONTRAST:  75 mL Omnipaque 350 COMPARISON:  Chest radiograph 09/20/2023; CTA chest 08/22/2023 FINDINGS: Cardiovascular: Respiratory motion obscures the segmental and subsegmental pulmonary arteries. No central pulmonary embolism. No pericardial effusion. Normal caliber thoracic aorta. Cardiomegaly. Mediastinum/Nodes: Bowing of the posterior trachea compatible with expiratory phase. Esophagus is unremarkable. Similar shotty mediastinal and hilar lymph nodes. Lungs/Pleura: Respiratory motion obscures fine detail. Diffuse bronchial wall and interlobular septal thickening. Patchy ground-glass opacities greater Sten the lower lungs. Atelectasis along the right minor fissure. No pleural effusion or pneumothorax. Upper Abdomen: Bilateral adrenal nodules are partially visualized and were previously characterized as benign on 02/17/2023 CT abdomen pelvis. No follow-up recommended. Partially visualized left renal cysts. No follow-up recommended. Musculoskeletal: No acute fracture. Unchanged 2 cm cystic lesion in the medial left upper chest likely a sebaceous cyst. Review of the MIP  images confirms the above findings. IMPRESSION: 1.  Respiratory motion obscures the segmental and subsegmental pulmonary arteries. No central pulmonary embolism. 2. Cardiomegaly with pulmonary edema. Electronically Signed   By: Minerva Fester M.D.   On: 09/21/2023 04:00   DG Chest 2 View Result Date: 09/20/2023 CLINICAL DATA:  Chest pain. EXAM: CHEST - 2 VIEW COMPARISON:  Chest recommended 08/25/2023. FINDINGS: Cardiomegaly with vascular congestion. Linear density along the minor fusion may represent atelectasis or small pleural effusion. Infiltrate is not excluded. No pneumothorax. No acute osseous pathology. IMPRESSION: 1. Cardiomegaly with vascular congestion. 2. Atelectasis versus small pleural effusion along the minor fissure. Electronically Signed   By: Elgie Collard M.D.   On: 09/20/2023 21:51    Subjective: Seen this morning.  Reported that her breathing was "50% better".  Denied chest pain and stated that this had resolved yesterday.  Indicated that the headache that she had yesterday was "going away".  She told me that she had been ambulating in the room without any difficulties.  Denied visual or strokelike symptoms.  During the morning visit, after discussing her care in detail and advising that we would continue in-hospital treatment for additional day and she would possibly be discharged tomorrow, she was agreeable.  However subsequent issues are as noted above.  Discharge Exam:  Vitals:   09/22/23 0413 09/22/23 0741 09/22/23 0742 09/22/23 1134  BP: (!) 156/59  (!) 138/111 (!) 155/98  Pulse: (!) 105  95 91  Resp: 20  (!) 25 19  Temp: 98.6 F (37 C)  98.4 F (36.9 C) 98.4 F (36.9 C)  TempSrc: Oral  Oral Oral  SpO2: 98% 100% 95% 97%  Weight:      Height:        General: Young female, moderately built and obese lying comfortably supine in bed without distress.  Patient was actually sleeping when I walked into the room and woke up after I called her name. Cardiovascular: S1 & S2 heard, RRR, S1/S2 +. No murmurs, rubs, gallops  or clicks. No JVD or pedal edema.  Telemetry personally reviewed: SR in the 90s with occasional mild ST in the low 100s. Respiratory: Clear to auscultation without wheezing, rhonchi or crackles.  No increased work of breathing. Abdominal:  Non distended, non tender & soft. No organomegaly or masses appreciated. Normal bowel sounds heard. CNS: Alert and oriented. No focal deficits. Extremities: no edema, no cyanosis    The results of significant diagnostics from this hospitalization (including imaging, microbiology, ancillary and laboratory) are listed below for reference.     Microbiology: No results found for this or any previous visit (from the past 240 hours).   Labs: CBC: Recent Labs  Lab 09/20/23 2134  WBC 7.2  HGB 12.6  HCT 40.4  MCV 85.8  PLT 242    Basic Metabolic Panel: Recent Labs  Lab 09/20/23 2134 09/21/23 1353 09/22/23 0856  NA 142  --  141  K 3.5  --  3.0*  CL 109  --  106  CO2 23  --  23  GLUCOSE 208*  --  139*  BUN 8  --  10  CREATININE 0.70 1.07* 0.77  CALCIUM 10.1  --  10.1  MG  --   --  1.7    Liver Function Tests: No results for input(s): "AST", "ALT", "ALKPHOS", "BILITOT", "PROT", "ALBUMIN" in the last 168 hours.  CBG: Recent Labs  Lab 09/21/23 1130 09/21/23 1657 09/21/23 2102 09/22/23 0555 09/22/23 1131  GLUCAP 344* 387*  212* 354* 308*     Time coordinating discharge: 35 minutes  SIGNED:  Marcellus Scott, MD,  FACP, [facility], [facility], [facility]   [facility] [place]     To contact the attending provider between 7A-7P or the covering provider during after hours 7P-7A, please log into the web site [email or web address] and access using universal [place] password for that web site. If you do not have the password, please call the hospital operator.

## 2023-09-22 NOTE — Plan of Care (Signed)
  Problem: Fluid Volume: Goal: Ability to maintain a balanced intake and output will improve Outcome: Progressing   Problem: Health Behavior/Discharge Planning: Goal: Ability to identify and utilize available resources and services will improve Outcome: Progressing Goal: Ability to manage health-related needs will improve Outcome: Progressing   Problem: Nutritional: Goal: Maintenance of adequate nutrition will improve Outcome: Progressing   Problem: Education: Goal: Knowledge of General Education information will improve Description: Including pain rating scale, medication(s)/side effects and non-pharmacologic comfort measures Outcome: Progressing   Problem: Health Behavior/Discharge Planning: Goal: Ability to manage health-related needs will improve Outcome: Progressing   Problem: Clinical Measurements: Goal: Ability to maintain clinical measurements within normal limits will improve Outcome: Progressing Goal: Will remain free from infection Outcome: Progressing Goal: Diagnostic test results will improve Outcome: Progressing   Problem: Activity: Goal: Risk for activity intolerance will decrease Outcome: Progressing   Problem: Nutrition: Goal: Adequate nutrition will be maintained Outcome: Progressing   Problem: Coping: Goal: Level of anxiety will decrease Outcome: Progressing   Problem: Elimination: Goal: Will not experience complications related to bowel motility Outcome: Progressing Goal: Will not experience complications related to urinary retention Outcome: Progressing   Problem: Pain Managment: Goal: General experience of comfort will improve and/or be controlled Outcome: Progressing   Problem: Safety: Goal: Ability to remain free from injury will improve Outcome: Progressing   Problem: Skin Integrity: Goal: Risk for impaired skin integrity will decrease Outcome: Progressing   Problem: Education: Goal: Ability to describe self-care measures that may  prevent or decrease complications (Diabetes Survival Skills Education) will improve Outcome: Not Progressing   Problem: Coping: Goal: Ability to adjust to condition or change in health will improve Outcome: Not Progressing

## 2023-09-22 NOTE — TOC Benefit Eligibility Note (Signed)
 Pharmacy Patient Advocate Encounter  Insurance verification completed.    The patient is insured through [place].     Ran test claim for Carvedilol and the current 30 day co-pay is $4.   This test claim was processed through [employer]- copay amounts may vary at other pharmacies due to [employer], or as the patient moves through the different stages of their insurance plan.

## 2023-09-22 NOTE — Inpatient Diabetes Management (Signed)
 Inpatient Diabetes Program Recommendations  AACE/ADA: New Consensus Statement on Inpatient Glycemic Control (2015)  Target Ranges:  Prepandial:   less than 140 mg/dL      Peak postprandial:   less than 180 mg/dL (1-2 hours)      Critically ill patients:  140 - 180 mg/dL   Lab Results  Component Value Date   GLUCAP 354 (H) 09/22/2023   HGBA1C 8.5 (H) 08/22/2023    Review of Glycemic Control  Latest Reference Range & Units 09/21/23 11:30 09/21/23 16:57 09/21/23 21:02 09/22/23 05:55  Glucose-Capillary 70 - 99 mg/dL 161 (H) 096 (H) 045 (H) 354 (H)   Diabetes history: DM 2 Outpatient Diabetes medications:  Metformin 500 mg bid Current orders for Inpatient glycemic control:  Novolog 0-15 units tid with meals and HS Prednisone 40 mg daily  Inpatient Diabetes Program Recommendations:    While on steroids, consider adding Semglee 12 units daily and Novolog 3 units tid with meals (hold if patient eats less than 50% or NPO).   Thanks,  Lorenza Cambridge, RN, BC-ADM Inpatient Diabetes Coordinator Pager [phone]  (8a-5p)

## 2023-09-22 NOTE — Progress Notes (Signed)
 Patient is wanting to leave AMA. MD notified. Patient received phone call from MD. MD then called this RN and stated he explained to patient that she is at high risk for deterioration including death. AMA paperwork signed. PIV removed. Telemetry box removed, CCMD notified. Charge nurse notified.  Kenard Gower, RN

## 2023-09-23 ENCOUNTER — Inpatient Hospital Stay (HOSPITAL_COMMUNITY)
Admission: EM | Admit: 2023-09-23 | Discharge: 2023-09-27 | DRG: 917 | Disposition: A | Discharge status: Home / Self Care | Payer: MEDICAID | Attending: Internal Medicine | Admitting: Internal Medicine

## 2023-09-23 ENCOUNTER — Other Ambulatory Visit: Payer: Self-pay

## 2023-09-23 ENCOUNTER — Emergency Department (HOSPITAL_COMMUNITY): Payer: MEDICAID

## 2023-09-23 DIAGNOSIS — F332 Major depressive disorder, recurrent severe without psychotic features: Secondary | ICD-10-CM | POA: Diagnosis present

## 2023-09-23 DIAGNOSIS — Z716 Tobacco abuse counseling: Secondary | ICD-10-CM

## 2023-09-23 DIAGNOSIS — R0603 Acute respiratory distress: Principal | ICD-10-CM | POA: Diagnosis present

## 2023-09-23 DIAGNOSIS — I110 Hypertensive heart disease with heart failure: Principal | ICD-10-CM | POA: Diagnosis present

## 2023-09-23 DIAGNOSIS — I509 Heart failure, unspecified: Principal | ICD-10-CM

## 2023-09-23 DIAGNOSIS — F17210 Nicotine dependence, cigarettes, uncomplicated: Secondary | ICD-10-CM | POA: Diagnosis present

## 2023-09-23 DIAGNOSIS — J45909 Unspecified asthma, uncomplicated: Secondary | ICD-10-CM

## 2023-09-23 DIAGNOSIS — J4489 Other specified chronic obstructive pulmonary disease: Secondary | ICD-10-CM | POA: Diagnosis present

## 2023-09-23 DIAGNOSIS — T405X1A Poisoning by cocaine, accidental (unintentional), initial encounter: Principal | ICD-10-CM | POA: Diagnosis present

## 2023-09-23 DIAGNOSIS — E1165 Type 2 diabetes mellitus with hyperglycemia: Secondary | ICD-10-CM

## 2023-09-23 DIAGNOSIS — Z89422 Acquired absence of other left toe(s): Secondary | ICD-10-CM

## 2023-09-23 DIAGNOSIS — M940 Chondrocostal junction syndrome [Tietze]: Secondary | ICD-10-CM

## 2023-09-23 DIAGNOSIS — F1420 Cocaine dependence, uncomplicated: Secondary | ICD-10-CM | POA: Diagnosis present

## 2023-09-23 DIAGNOSIS — E669 Obesity, unspecified: Secondary | ICD-10-CM | POA: Diagnosis present

## 2023-09-23 DIAGNOSIS — I5033 Acute on chronic diastolic (congestive) heart failure: Principal | ICD-10-CM | POA: Insufficient documentation

## 2023-09-23 DIAGNOSIS — I5043 Acute on chronic combined systolic (congestive) and diastolic (congestive) heart failure: Secondary | ICD-10-CM | POA: Insufficient documentation

## 2023-09-23 DIAGNOSIS — F1290 Cannabis use, unspecified, uncomplicated: Secondary | ICD-10-CM | POA: Diagnosis present

## 2023-09-23 DIAGNOSIS — Z6835 Body mass index (BMI) 35.0-35.9, adult: Secondary | ICD-10-CM

## 2023-09-23 DIAGNOSIS — I161 Hypertensive emergency: Secondary | ICD-10-CM | POA: Diagnosis present

## 2023-09-23 DIAGNOSIS — M549 Dorsalgia, unspecified: Secondary | ICD-10-CM | POA: Diagnosis present

## 2023-09-23 DIAGNOSIS — Z5900 Homelessness unspecified: Secondary | ICD-10-CM

## 2023-09-23 DIAGNOSIS — I10 Essential (primary) hypertension: Secondary | ICD-10-CM | POA: Diagnosis present

## 2023-09-23 LAB — RAPID URINE DRUG SCREEN, HOSP PERFORMED
Amphetamines: NOT DETECTED
Barbiturates: NOT DETECTED
Benzodiazepines: NOT DETECTED
Cocaine: POSITIVE — AB
Opiates: NOT DETECTED
Tetrahydrocannabinol: POSITIVE — AB

## 2023-09-23 LAB — I-STAT VENOUS BLOOD GAS, ED
Acid-Base Excess: 2 mmol/L (ref 0.0–2.0)
Bicarbonate: 27.4 mmol/L (ref 20.0–28.0)
Calcium, Ion: 1.31 mmol/L (ref 1.15–1.40)
HCT: 40 % (ref 36.0–46.0)
Hemoglobin: 13.6 g/dL (ref 12.0–15.0)
O2 Saturation: 99 %
Potassium: 3.5 mmol/L (ref 3.5–5.1)
Sodium: 144 mmol/L (ref 135–145)
TCO2: 29 mmol/L (ref 22–32)
pCO2, Ven: 43.4 mmHg — ABNORMAL LOW (ref 44–60)
pH, Ven: 7.408 (ref 7.25–7.43)
pO2, Ven: 117 mmHg — ABNORMAL HIGH (ref 32–45)

## 2023-09-23 LAB — CBC WITH DIFFERENTIAL/PLATELET
Abs Immature Granulocytes: 0.03 10*3/uL (ref 0.00–0.07)
Basophils Absolute: 0 10*3/uL (ref 0.0–0.1)
Basophils Relative: 0 %
Eosinophils Absolute: 0 10*3/uL (ref 0.0–0.5)
Eosinophils Relative: 0 %
HCT: 41.2 % (ref 36.0–46.0)
Hemoglobin: 13 g/dL (ref 12.0–15.0)
Immature Granulocytes: 0 %
Lymphocytes Relative: 20 %
Lymphs Abs: 1.8 10*3/uL (ref 0.7–4.0)
MCH: 27 pg (ref 26.0–34.0)
MCHC: 31.6 g/dL (ref 30.0–36.0)
MCV: 85.7 fL (ref 80.0–100.0)
Monocytes Absolute: 0.4 10*3/uL (ref 0.1–1.0)
Monocytes Relative: 4 %
Neutro Abs: 6.8 10*3/uL (ref 1.7–7.7)
Neutrophils Relative %: 76 %
Platelets: 209 10*3/uL (ref 150–400)
RBC: 4.81 MIL/uL (ref 3.87–5.11)
RDW: 15.9 % — ABNORMAL HIGH (ref 11.5–15.5)
WBC: 9 10*3/uL (ref 4.0–10.5)
nRBC: 0 % (ref 0.0–0.2)

## 2023-09-23 LAB — TSH: TSH: 2.421 u[IU]/mL (ref 0.350–4.500)

## 2023-09-23 LAB — BASIC METABOLIC PANEL WITH GFR
Anion gap: 10 (ref 5–15)
BUN: 10 mg/dL (ref 6–20)
CO2: 23 mmol/L (ref 22–32)
Calcium: 10 mg/dL (ref 8.9–10.3)
Chloride: 109 mmol/L (ref 98–111)
Creatinine, Ser: 0.67 mg/dL (ref 0.44–1.00)
GFR, Estimated: >60 mL/min (ref >60)
Glucose, Bld: 185 mg/dL — ABNORMAL HIGH (ref 70–99)
Potassium: 3.6 mmol/L (ref 3.5–5.1)
Sodium: 142 mmol/L (ref 135–145)

## 2023-09-23 LAB — TROPONIN I (HIGH SENSITIVITY)
Troponin I (High Sensitivity): 30 ng/L — ABNORMAL HIGH (ref <18)
Troponin I (High Sensitivity): 39 ng/L — ABNORMAL HIGH (ref <18)

## 2023-09-23 LAB — BRAIN NATRIURETIC PEPTIDE: B Natriuretic Peptide: 409.4 pg/mL — ABNORMAL HIGH (ref 0.0–100.0)

## 2023-09-23 LAB — GLUCOSE, CAPILLARY
Glucose-Capillary: 222 mg/dL — ABNORMAL HIGH (ref 70–99)
Glucose-Capillary: 197 mg/dL — ABNORMAL HIGH (ref 70–99)
Glucose-Capillary: 233 mg/dL — ABNORMAL HIGH (ref 70–99)

## 2023-09-23 LAB — CBG MONITORING, ED
Glucose-Capillary: 198 mg/dL — ABNORMAL HIGH (ref 70–99)
Glucose-Capillary: 265 mg/dL — ABNORMAL HIGH (ref 70–99)

## 2023-09-23 LAB — MAGNESIUM: Magnesium: 1.6 mg/dL — ABNORMAL LOW (ref 1.7–2.4)

## 2023-09-23 MED ORDER — FUROSEMIDE 10 MG/ML IJ SOLN
40.0000 mg | Freq: Every day | INTRAMUSCULAR | Status: DC
  Administered 2023-09-24: 40 mg via INTRAVENOUS
  Filled 2023-09-23: qty 4

## 2023-09-23 MED ORDER — ALBUTEROL SULFATE (2.5 MG/3ML) 0.083% IN NEBU
2.5000 mg | INHALATION_SOLUTION | RESPIRATORY_TRACT | Status: DC | PRN
  Administered 2023-09-24 – 2023-09-25 (×2): 2.5 mg via RESPIRATORY_TRACT
  Filled 2023-09-23 (×2): qty 3

## 2023-09-23 MED ORDER — IPRATROPIUM-ALBUTEROL 0.5-2.5 (3) MG/3ML IN SOLN
3.0000 mL | Freq: Four times a day (QID) | RESPIRATORY_TRACT | Status: AC
  Administered 2023-09-23: 3 mL via RESPIRATORY_TRACT
  Filled 2023-09-23 (×2): qty 3

## 2023-09-23 MED ORDER — ONDANSETRON HCL 4 MG PO TABS: 4.0000 mg | ORAL_TABLET | Freq: Four times a day (QID) | ORAL | Status: DC | PRN

## 2023-09-23 MED ORDER — ALBUTEROL SULFATE HFA 108 (90 BASE) MCG/ACT IN AERS: 2.0000 | INHALATION_SPRAY | Freq: Four times a day (QID) | RESPIRATORY_TRACT | Status: DC | PRN

## 2023-09-23 MED ORDER — MELATONIN 3 MG PO TABS: 3.0000 mg | ORAL_TABLET | Freq: Every day | ORAL | Status: DC

## 2023-09-23 MED ORDER — INSULIN ASPART 100 UNIT/ML IJ SOLN
0.0000 [IU] | Freq: Three times a day (TID) | INTRAMUSCULAR | Status: DC
  Administered 2023-09-23: 3 [IU] via SUBCUTANEOUS
  Administered 2023-09-23: 5 [IU] via SUBCUTANEOUS
  Administered 2023-09-24: 11 [IU] via SUBCUTANEOUS
  Administered 2023-09-24: 5 [IU] via SUBCUTANEOUS
  Administered 2023-09-24: 11 [IU] via SUBCUTANEOUS
  Administered 2023-09-25: 5 [IU] via SUBCUTANEOUS
  Administered 2023-09-25: 8 [IU] via SUBCUTANEOUS
  Administered 2023-09-25: 5 [IU] via SUBCUTANEOUS
  Administered 2023-09-26: 8 [IU] via SUBCUTANEOUS
  Administered 2023-09-26: 5 [IU] via SUBCUTANEOUS
  Administered 2023-09-26: 3 [IU] via SUBCUTANEOUS
  Administered 2023-09-27: 5 [IU] via SUBCUTANEOUS

## 2023-09-23 MED ORDER — ENOXAPARIN SODIUM 60 MG/0.6ML IJ SOSY
50.0000 mg | PREFILLED_SYRINGE | INTRAMUSCULAR | Status: DC
  Administered 2023-09-23 – 2023-09-27 (×5): 50 mg via SUBCUTANEOUS
  Filled 2023-09-23 (×5): qty 0.6

## 2023-09-23 MED ORDER — FUROSEMIDE 10 MG/ML IJ SOLN: 60.0000 mg | Freq: Once | INTRAMUSCULAR | Status: DC

## 2023-09-23 MED ORDER — KETOROLAC TROMETHAMINE 30 MG/ML IJ SOLN
30.0000 mg | Freq: Once | INTRAMUSCULAR | Status: AC
  Administered 2023-09-23: 30 mg via INTRAVENOUS
  Filled 2023-09-23: qty 1

## 2023-09-23 MED ORDER — LIDOCAINE 5 % EX PTCH
1.0000 | MEDICATED_PATCH | CUTANEOUS | Status: DC
  Administered 2023-09-23 – 2023-09-26 (×4): 1 via TRANSDERMAL
  Filled 2023-09-23 (×4): qty 1

## 2023-09-23 MED ORDER — MELATONIN 3 MG PO TABS
3.0000 mg | ORAL_TABLET | Freq: Every day | ORAL | Status: DC
  Administered 2023-09-23 – 2023-09-26 (×4): 3 mg via ORAL
  Filled 2023-09-23 (×4): qty 1

## 2023-09-23 MED ORDER — POTASSIUM CHLORIDE 10 MEQ/100ML IV SOLN
10.0000 meq | INTRAVENOUS | Status: AC
  Administered 2023-09-23: 10 meq via INTRAVENOUS
  Filled 2023-09-23: qty 100

## 2023-09-23 MED ORDER — LOSARTAN POTASSIUM 50 MG PO TABS
50.0000 mg | ORAL_TABLET | Freq: Every day | ORAL | Status: DC
  Administered 2023-09-23 – 2023-09-27 (×5): 50 mg via ORAL
  Filled 2023-09-23 (×5): qty 1

## 2023-09-23 MED ORDER — ACETAMINOPHEN 325 MG PO TABS
650.0000 mg | ORAL_TABLET | Freq: Four times a day (QID) | ORAL | Status: DC | PRN
  Administered 2023-09-25 – 2023-09-26 (×2): 650 mg via ORAL
  Filled 2023-09-23 (×2): qty 2

## 2023-09-23 MED ORDER — PANTOPRAZOLE SODIUM 40 MG PO TBEC
40.0000 mg | DELAYED_RELEASE_TABLET | Freq: Every day | ORAL | Status: DC
  Administered 2023-09-23 – 2023-09-27 (×5): 40 mg via ORAL
  Filled 2023-09-23 (×5): qty 1

## 2023-09-23 MED ORDER — FUROSEMIDE 10 MG/ML IJ SOLN
40.0000 mg | Freq: Once | INTRAMUSCULAR | Status: AC
  Administered 2023-09-23: 40 mg via INTRAVENOUS
  Filled 2023-09-23: qty 4

## 2023-09-23 MED ORDER — KETOROLAC TROMETHAMINE 15 MG/ML IJ SOLN
15.0000 mg | Freq: Four times a day (QID) | INTRAMUSCULAR | Status: DC | PRN
  Administered 2023-09-23 – 2023-09-25 (×7): 15 mg via INTRAVENOUS
  Filled 2023-09-23 (×8): qty 1

## 2023-09-23 MED ORDER — ACETAMINOPHEN 650 MG RE SUPP: 650.0000 mg | Freq: Four times a day (QID) | RECTAL | Status: DC | PRN

## 2023-09-23 MED ORDER — MAGNESIUM SULFATE 2 GM/50ML IV SOLN
2.0000 g | Freq: Once | INTRAVENOUS | Status: AC
  Administered 2023-09-23: 2 g via INTRAVENOUS
  Filled 2023-09-23: qty 50

## 2023-09-23 MED ORDER — KETOROLAC TROMETHAMINE 30 MG/ML IJ SOLN: 30.0000 mg | Freq: Four times a day (QID) | INTRAMUSCULAR | Status: DC | PRN

## 2023-09-23 MED ORDER — ONDANSETRON HCL 4 MG/2ML IJ SOLN: 4.0000 mg | Freq: Four times a day (QID) | INTRAMUSCULAR | Status: DC | PRN

## 2023-09-23 MED ORDER — HYDRALAZINE HCL 20 MG/ML IJ SOLN: 5.0000 mg | Freq: Three times a day (TID) | INTRAMUSCULAR | Status: DC | PRN

## 2023-09-23 NOTE — ED Provider Notes (Signed)
 [place] EMERGENCY DEPARTMENT AT [facility] Provider Note   CSN: [id number] Arrival date & time: 09/23/23  0409     History  Chief Complaint  Patient presents with   Shortness of Breath    Julie [name] is a 48 y.o. female with PMH as listed below who presents BIB [facility] from home.  Pt c/o SOB; 98% on CPAP on arrival, switched to BiPAP. Appears in moderate resp distress and difficult to obtain history, but patient endorses some crack cocaine use today after leaving AMA from hospital and severe SOB waking her up at 3 am. Per chart review was recently admitted 4/1-4/3 and left AMA. Admitted for suspected asthma/COPD exacerbation, HTN urgency in context of non adherence to her medications and ongoing cocaine/polysubstance abuse and uncontrolled T2DM.  Received 1 nitroglycerin with EMS.      Past Medical History:  Diagnosis Date   Asthma    Diabetes mellitus without complication (HCC)    Fracture, humerus closed, shaft 07/11/2014   left   History of bronchitis    Mental health problem    Open fracture of great toe of left foot 12/25/2015       Home Medications Prior to Admission medications   Medication Sig Start Date End Date Taking? Authorizing Provider  albuterol (VENTOLIN HFA) 108 (90 Base) MCG/ACT inhaler Inhale 2 puffs into the lungs every 6 (six) hours as needed for wheezing or shortness of breath. 08/25/23   Dezii, Alexandra, DO  losartan (COZAAR) 50 MG tablet Take 1 tablet (50 mg total) by mouth daily. Patient not taking: Reported on 09/21/2023 08/26/23 11/24/23  Dezii, Alexandra, DO  metFORMIN (GLUCOPHAGE) 500 MG tablet Take 1 tablet (500 mg total) by mouth 2 (two) times daily with a meal. Patient not taking: Reported on 09/21/2023 08/25/23 09/24/23  Dezii, Alexandra, DO  metoprolol tartrate (LOPRESSOR) 25 MG tablet Take 1 tablet (25 mg total) by mouth 2 (two) times daily. Patient not taking: Reported on 09/21/2023 08/25/23 11/23/23  Dezii, Alexandra, DO  pantoprazole  (PROTONIX) 40 MG tablet Take 1 tablet (40 mg total) by mouth daily. Patient not taking: Reported on 09/21/2023 08/26/23   Dezii, Alexandra, DO      Allergies    Patient has no known allergies.    Review of Systems   Review of Systems A 10 point review of systems was performed and is negative unless otherwise reported in HPI.  Physical Exam Updated Vital Signs Ht 5\' 10"  (1.778 m)   Wt 110 kg   BMI 34.80 kg/m  Physical Exam General: Female in mild to moderate respiratory distress HEENT: PERRLA, Sclera anicteric, MMM, trachea midline.  Cardiology: Regular tachycardic rate, no murmurs/rubs/gallops.  Resp: Tachypnea with mild to moderate respiratory distress, on BiPAP, no significant abnormal lung sounds Abd: Soft, non-tender, non-distended. No rebound tenderness or guarding.  GU: Deferred. MSK: No peripheral edema or signs of trauma. Extremities without deformity or TTP. No cyanosis or clubbing. Skin: warm, dry. Neuro: A&Ox4, CNs II-XII grossly intact. MAEs. Sensation grossly intact.   ED Results / Procedures / Treatments   Labs (all labs ordered are listed, but only abnormal results are displayed) Labs Reviewed  CBC WITH DIFFERENTIAL/PLATELET - Abnormal; Notable for the following components:      Result Value   RDW 15.9 (*)    All other components within normal limits  BASIC METABOLIC PANEL WITH GFR - Abnormal; Notable for the following components:   Glucose, Bld 185 (*)    All other components within  normal limits  BRAIN NATRIURETIC PEPTIDE - Abnormal; Notable for the following components:   B Natriuretic Peptide 409.4 (*)    All other components within normal limits  RAPID URINE DRUG SCREEN, HOSP PERFORMED - Abnormal; Notable for the following components:   Cocaine POSITIVE (*)    Tetrahydrocannabinol POSITIVE (*)    All other components within normal limits  MAGNESIUM - Abnormal; Notable for the following components:   Magnesium 1.6 (*)    All other components within  normal limits  GLUCOSE, CAPILLARY - Abnormal; Notable for the following components:   Glucose-Capillary 222 (*)    All other components within normal limits  I-STAT VENOUS BLOOD GAS, ED - Abnormal; Notable for the following components:   pCO2, Ven 43.4 (*)    pO2, Ven 117 (*)    All other components within normal limits  CBG MONITORING, ED - Abnormal; Notable for the following components:   Glucose-Capillary 198 (*)    All other components within normal limits  CBG MONITORING, ED - Abnormal; Notable for the following components:   Glucose-Capillary 265 (*)    All other components within normal limits  TROPONIN I (HIGH SENSITIVITY) - Abnormal; Notable for the following components:   Troponin I (High Sensitivity) 30 (*)    All other components within normal limits  TROPONIN I (HIGH SENSITIVITY) - Abnormal; Notable for the following components:   Troponin I (High Sensitivity) 39 (*)    All other components within normal limits  TSH  MAGNESIUM  MAGNESIUM  MAGNESIUM    EKG EKG Interpretation Date/Time:  Friday September 23 2023 04:28:21 EDT Ventricular Rate:  103 PR Interval:  122 QRS Duration:  88 QT Interval:  377 QTC Calculation: 494 R Axis:   -15  Text Interpretation: Sinus tachycardia Consider right atrial enlargement Probable LVH with secondary repol abnrm Anterior Q waves, possibly due to LVH Confirmed by Annita Kindle [phone]) on 09/23/2023 4:53:12 AM  Radiology CXR: Enlargement of the cardiopericardial silhouette with pulmonary vascular congestion.  Procedures .Critical Care  Performed by: Merdis Stalling, MD Authorized by: Merdis Stalling, MD   Critical care provider statement:    Critical care time (minutes):  30   Critical care was necessary to treat or prevent imminent or life-threatening deterioration of the following conditions:  Respiratory failure   Critical care was time spent personally by me on the following activities:  Development of treatment plan with  patient or surrogate, discussions with consultants, evaluation of patient's response to treatment, examination of patient, ordering and review of laboratory studies, ordering and review of radiographic studies, ordering and performing treatments and interventions, pulse oximetry, re-evaluation of patient's condition and review of old charts   Care discussed with: admitting provider       Medications Ordered in ED Medications  potassium chloride 10 mEq in 100 mL IVPB (10 mEq Intravenous Not Given 09/23/23 0849)  ipratropium-albuterol (DUONEB) 0.5-2.5 (3) MG/3ML nebulizer solution 3 mL (3 mLs Nebulization Not Given 09/23/23 1500)  furosemide (LASIX) injection 40 mg (40 mg Intravenous Given 09/23/23 0611)  ketorolac (TORADOL) 30 MG/ML injection 30 mg (30 mg Intravenous Given 09/23/23 1108)  magnesium sulfate IVPB 2 g 50 mL (2 g Intravenous New Bag/Given 09/23/23 1724)    ED Course/ Medical Decision Making/ A&P                          Medical Decision Making Amount and/or Complexity of Data Reviewed Labs: ordered. Decision-making details  documented in ED Course. Radiology: ordered. Decision-making details documented in ED Course.  Risk Prescription drug management. Decision regarding hospitalization.    This patient presents to the ED for concern of resp distress/chest pain, this involves an extensive number of treatment options, and is a complaint that carries with it a high risk of complications and morbidity.  I considered the following differential and admission for this acute, potentially life threatening condition.   MDM:    DDX for dyspnea includes but is not limited to:  No significant wheezing to suggest asthma/COPD exacerbation.  Was discharged AMA today from admission for possible CHF/hypertensive emergency.  On arrival patient's blood pressure 147/126 s/p 1 sublingual nitroglycerin dose.  No asymmetric leg swelling to indicate PE although this could be on differential but chest x-ray  more likely vascular congestion/pulmonary edema.  BNP 409.    Clinical Course as of 10/02/23 2240  Fri Sep 23, 2023  0452 CBC with Differential(!) Unremarkable in the context of this patient's presentation  [HN]  0528 B Natriuretic Peptide(!): 409.4 Elevated from 4 weeks ago [HN]  0528 DG Chest Portable 1 View Enlargement of the cardiopericardial silhouette with pulmonary vascular congestion.   [HN]  0528 Potassium: 3.6 Will replete and give lasix [HN]  0529 pCO2, Ven(!): 43.4 No hypercarbia [HN]  0639 O2 Device(S): Room Air patient is satting well on room air and has improved significantly.  Given that she discharge AMA and the degree of respiratory distress in which she presented to the ED, believe she is best served by admission for CHF exacerbation.  Admitted to hospitalist Dr. Francesco Inks.  [HN]    Clinical Course User Index [HN] Merdis Stalling, MD    Labs: I Ordered, and personally interpreted labs.  The pertinent results include: Those listed above  Imaging Studies ordered: I ordered imaging studies including chest x-ray I independently visualized and interpreted imaging. I agree with the radiologist interpretation  Additional history obtained from chart review, EMS.    Cardiac Monitoring: The patient was maintained on a cardiac monitor.  I personally viewed and interpreted the cardiac monitored which showed an underlying rhythm of: Sinus tachycardia  Reevaluation: After the interventions noted above, I reevaluated the patient and found that they have :improved  Social Determinants of Health:  lives independently  Disposition: Admit to hospitalist  Co morbidities that complicate the patient evaluation  Past Medical History:  Diagnosis Date   Asthma    Diabetes mellitus without complication (HCC)    Fracture, humerus closed, shaft 07/11/2014   left   History of bronchitis    Mental health problem    Open fracture of great toe of left foot 12/25/2015      Medicines No orders of the defined types were placed in this encounter.   I have reviewed the patients home medicines and have made adjustments as needed  Problem List / ED Course: Problem List Items Addressed This Visit       Cardiovascular and Mediastinum   Concern for new onset of CHF with exacerbation (congestive heart failure) (HCC)   Relevant Medications   spironolactone (ALDACTONE) 25 MG tablet   furosemide (LASIX) 20 MG tablet   losartan (COZAAR) 50 MG tablet   Other Visit Diagnoses       Respiratory distress    -  Primary                   This note was created using dictation software, which may contain spelling or grammatical  errors.    Merdis Stalling, MD 10/02/23 [phone]

## 2023-09-23 NOTE — ED Notes (Addendum)
 This RN found pt at the end of the bed with her bipap taken off. This nurse informed the pt that she needed to keep it on per MD orders. The pt stated "I can't have this thing on me". MD notified. RT at bedside to assist with taking bipap off.

## 2023-09-23 NOTE — Assessment & Plan Note (Addendum)
 Patient had pain control with acetaminophen and tramadol.

## 2023-09-23 NOTE — Assessment & Plan Note (Addendum)
 Smoking cessation

## 2023-09-23 NOTE — H&P (Signed)
 History and Physical    Patient: Julie [name] WJX:[id number] DOB: [birth year] DOA: 09/23/2023 DOS: the patient was seen and examined on 09/23/2023 PCP: Pcp, No  Patient coming from: Homeless    Chief Complaint: respiratory distress   HPI: Julie [name] is a 48 y.o. female with extensive medical history including polysubstance abuse (ongoing tobacco, cocaine, THC use), poorly controlled HTN, asthma/COPD, depression, HFpEF, newly diagnosed DM who presented to ED with complaints of shortness of breath. She presented to ED in respiratory distress and arrived on bipap. She signed herself around 3pm and went home and used "a small amount" of crack cocaine.  She woke up around 3am and couldn't breath and had some chest pain. She states the pain in her chest felt like squeezing. She still feels this. Not substernal, more superior/anterior chest wall. She called 911. She has no fever/chills, cough. She denies any leg swelling, but they feel heavy. She does admit to difficulty breathing lying flat. She isn't sure if she has dyspnea on exertion.   Recently admitted 4/1-4/3 and left AMA. Admitted for suspected asthma/COPD exacerbation, HTN urgency in context of non adherence to her medications and ongoing cocaine/polysubstance abuse and uncontrolled T2DM.     Denies any fever/chills, vision changes/headaches, palpitations, cough, abdominal pain, N/V/D, dysuria or leg swelling.    She smokes about 7-8 cig/day. She does not drink alcohol daily. She does use crack cocaine 2-3x/week and thc.   ER Course:  vitals: afebrile, bp: 156/105, HR: 107, RR: 32, oxygen: 99%RA (was on bipap and took off)  Pertinent labs: bnp: 409,  CXR: enlargement of the cardiopericardial silhouette with pulmonary vascular congestion  In ED: placed on bipap, given 40mg  IV lasix and potassium. TRH asked to admit.     Review of Systems: As mentioned in the history of present illness. All other systems reviewed and are  negative. 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 She has a 9 pack-year smoking history. She has never used smokeless tobacco. She reports current alcohol use. She reports current drug use. Drugs: Marijuana, "Crack" cocaine, and MDMA (Ecstacy).  No Known Allergies  No family history on file.  Prior to Admission medications   Medication Sig Start Date End Date Taking? Authorizing Provider  albuterol (VENTOLIN HFA) 108 (90 Base) MCG/ACT inhaler Inhale 2 puffs into the lungs every 6 (six) hours as needed for wheezing or shortness of breath. 08/25/23   Dezii, Alexandra, DO  losartan (COZAAR) 50 MG tablet Take 1 tablet (50 mg  total) by mouth daily. Patient not taking: Reported on 09/21/2023 08/26/23 11/24/23  Debarah Crape, DO  metFORMIN (GLUCOPHAGE) 500 MG tablet Take 1 tablet (500 mg total) by mouth 2 (two) times daily with a meal. Patient not taking: Reported on 09/21/2023 08/25/23 09/24/23  Debarah Crape, DO  metoprolol tartrate (LOPRESSOR) 25 MG tablet Take 1 tablet (25 mg total) by mouth 2 (two) times daily. Patient not  taking: Reported on 09/21/2023 08/25/23 11/23/23  Debarah Crape, DO  pantoprazole (PROTONIX) 40 MG tablet Take 1 tablet (40 mg total) by mouth daily. Patient not taking: Reported on 09/21/2023 08/26/23   Debarah Crape, DO    Physical Exam: Vitals:   09/23/23 0430 09/23/23 0610 09/23/23 0903 09/23/23 1237  BP: (!) 158/109 (!) 156/105  (!) 152/120  Pulse: (!) 103 (!) 107    Resp: (!) 30 (!) 32  18  Temp:   98.2 F (36.8 C) 98.5 F (36.9 C)  TempSrc:   Oral Oral  SpO2: 100% 99%  97%  Weight:    110.9 kg  Height:    5\' 10"  (1.778 m)   General:  Appears calm and comfortable and is in NAD Eyes:  PERRL, EOMI, normal lids, iris ENT:  grossly normal hearing, lips & tongue, dry mucous membranes; appropriate dentition Neck:  no LAD, masses or thyromegaly; no carotid bruits, no JVD Cardiovascular:  RRR, no m/r/g. No LE edema.  Respiratory:   CTA bilaterally with no wheezes/rales/rhonchi.  Normal respiratory effort. Abdomen:  soft, NT, ND, NABS Back:   normal alignment, no CVAT Skin:  no rash or induration seen on limited exam Musculoskeletal:  grossly normal tone BUE/BLE, good ROM, no bony abnormality Lower extremity:  No LE edema.  Limited foot exam with no ulcerations.  2+ distal pulses.left foot with 2 toes amputation  Psychiatric:  grossly normal mood and affect, speech fluent and appropriate, AOx3 Neurologic:  CN 2-12 grossly intact, moves all extremities in coordinated fashion, sensation intact   Radiological Exams on Admission: Independently reviewed - see discussion in A/P where applicable  DG Chest Portable 1 View Result Date: 09/23/2023 CLINICAL DATA:  Shortness of breath. EXAM: PORTABLE CHEST 1 VIEW COMPARISON:  09/20/2023 FINDINGS: The cardio pericardial silhouette is enlarged. There is pulmonary vascular congestion without overt pulmonary edema. Low volume film without focal consolidation or pleural effusion. No acute bony abnormality. Telemetry leads overlie the chest. IMPRESSION:  Enlargement of the cardiopericardial silhouette with pulmonary vascular congestion. Electronically Signed   By: Kennith Center M.D.   On: 09/23/2023 05:02    EKG: Independently reviewed.  Sinus tachycardia with rate 103; nonspecific ST changes with no evidence of acute ischemia   Labs on Admission: I have personally reviewed the available labs and imaging studies at the time of the admission.  Pertinent labs:   Bnp: 409   Assessment and Plan: Principal Problem:   Acute exacerbation of CHF (congestive heart failure) (HCC) Active Problems:   Asthma, chronic   Costochondritis   Hypomagnesemia   History of essential hypertension   Uncontrolled type 2 diabetes mellitus with hyperglycemia, without long-term current use of insulin (HCC)   Cocaine use disorder, severe, dependence (HCC)   Continuous dependence on cigarette smoking    Assessment and Plan: * Acute exacerbation of CHF (congestive heart failure) (HCC) 48 year old presenting to ED in respiratory distress on cpap due to CHF exacerbation vs. Flash pulmonary edema in setting of crack cocaine use  -obs to tele  -troponin 30 (at baseline) -now weaned  to room air  -CXR with vascular congestion and elevated BNP to over 400 consistent with CHF exacerbation  -given lasix 40mg  in ED, continue this daily  -strict I/O and daily weights -echo 08/2023 with EF of 50-55% and indeterminate diastolic function. Could not assess PA pressure  -crack cocaine use and uncontrolled HTN likely contributing factor  -discussed importance of stopping cocaine, taking medication  -continue ARB, holding metoprolol in setting of acute cocaine use  -SW consult for PCP   Asthma, chronic No wheezing or exacerbation Treated 4/1-4/3 for exacerbation  Respiratory distress in setting of crack cocaine. CHF vs. Copd/asthma  Treating CHF, see #1 Scheduled duonebs, SABA prn  No indication for steroids at this time as she has no wheeze on exam    Hypomagnesemia Replete and trend   Costochondritis Reproducible pain over sternum Troponin flat, at baseline X1 dose of toradol Heating pad Discussed nature of this with her   History of essential hypertension -presented on 4/1 in HTN urgency, but blood pressure improved today -never picked up medication (losartan/metoprolol)  -continue losartan 50mg  -avoid beta blocker with ongoing cocaine use for now, suspect used yesterday when left AMA  -IV hydralazine PRN and adjust losartan as needed   Uncontrolled type 2 diabetes mellitus with hyperglycemia, without long-term current use of insulin (HCC) Recent A1C of 8.5  Hold metformin  Sugar better today then it has been  SSI and accuchecks QAC/HS  sW consult for PCP   Cocaine use disorder, severe, dependence (HCC) Regularly uses crack cocaine 2-3x/week UDS pending, but admits to using yesterday Encouraged, counseled to stop using as contributing to heart issues   Continuous dependence on cigarette smoking Declines nicotine patch  Cessation encouraged     Advance Care Planning:   Code Status: Full Code   Consults: SW  DVT Prophylaxis: lovenox   Family Communication: none   Severity of Illness: The appropriate patient status for this patient is OBSERVATION. Observation status is judged to be reasonable and necessary in order to provide the required intensity of service to ensure the patient's safety. The patient's presenting symptoms, physical exam findings, and initial radiographic and laboratory data in the context of their medical condition is felt to place them at decreased risk for further clinical deterioration. Furthermore, it is anticipated that the patient will be medically stable for discharge from the hospital within 2 midnights of admission.   Author: Orland Mustard, MD 09/23/2023 3:44 PM  For on call review [email or web address].

## 2023-09-23 NOTE — ED Triage Notes (Signed)
 Pt BIB [facility] from home. Pt c/o SOB; 98% on CPAP on arrival. Pt left AMA from hospital on 4/3; recent dx of CHF.   1 nitroglycerin

## 2023-09-23 NOTE — Assessment & Plan Note (Addendum)
 Renal function with serum cr at 1,0 with K at 3,7 and serum bicarbonate at 22  Na 138 and Mg 1,7   Plan to add 40 meq Kcl and 2 g mag sulfate.  Follow up renal function in am.

## 2023-09-23 NOTE — Assessment & Plan Note (Addendum)
 No signs of acute exacerbation. Wheezing is likely due to pulmonary edema  Continue oxymetry

## 2023-09-23 NOTE — Assessment & Plan Note (Addendum)
 Hypertensive emergency.   Continue blood pressure control with losartan and spironolactone.  Follow up blood pressure as outpatient.

## 2023-09-23 NOTE — Progress Notes (Signed)
 CSW added shelter resources to patient's AVS.  Per chart review, patient left AMA from 4E yesterday.  Edwin Dada, MSW, LCSW Transitions of Care  Clinical Social Worker II [phone]

## 2023-09-23 NOTE — Assessment & Plan Note (Addendum)
 Echocardiogram with preserved LV systolic function with EF 50 to 55%, mild LVH, RV with normal systolic function, LA with mild dilatation, no significant valvular disease.   Urine output is 3,775 ml Systolic blood pressure 150 mmHg range.   Patient had one dose of IV furosemide this am, will transition to po loop diuretic in am.  Continue spironolactone and losartan.  Possible addition of SGLT 2 inh, but concerned about compliance with multiple medical therapy.

## 2023-09-23 NOTE — Progress Notes (Signed)
 Pt did not tolerate BIPAP, pt took off the mask several times. Per MD Jearld Fenton, pt can be taken off BIPAP to see if she tolerates. Pt SPO2 is 100% on R.A and pt is tolerating atm.

## 2023-09-23 NOTE — Assessment & Plan Note (Addendum)
 Positive cocaine and THC on admission.

## 2023-09-23 NOTE — Assessment & Plan Note (Addendum)
 Recent A1C of 8.5  Hold metformin  Continue insulin sliding scale for glucose cover and monitoring  Add basal insulin for better glucose control, she had 18 units of aspart insulin yesterday.  Will increase basal insulin with 15 units.

## 2023-09-24 DIAGNOSIS — F1420 Cocaine dependence, uncomplicated: Secondary | ICD-10-CM

## 2023-09-24 DIAGNOSIS — I5033 Acute on chronic diastolic (congestive) heart failure: Secondary | ICD-10-CM | POA: Diagnosis not present

## 2023-09-24 DIAGNOSIS — M940 Chondrocostal junction syndrome [Tietze]: Secondary | ICD-10-CM | POA: Diagnosis not present

## 2023-09-24 DIAGNOSIS — F17210 Nicotine dependence, cigarettes, uncomplicated: Secondary | ICD-10-CM

## 2023-09-24 DIAGNOSIS — J45909 Unspecified asthma, uncomplicated: Secondary | ICD-10-CM | POA: Diagnosis not present

## 2023-09-24 DIAGNOSIS — F332 Major depressive disorder, recurrent severe without psychotic features: Secondary | ICD-10-CM

## 2023-09-24 DIAGNOSIS — I10 Essential (primary) hypertension: Secondary | ICD-10-CM

## 2023-09-24 DIAGNOSIS — E1165 Type 2 diabetes mellitus with hyperglycemia: Secondary | ICD-10-CM

## 2023-09-24 LAB — BASIC METABOLIC PANEL WITH GFR
Anion gap: 10 (ref 5–15)
BUN: 14 mg/dL (ref 6–20)
CO2: 22 mmol/L (ref 22–32)
Calcium: 9.7 mg/dL (ref 8.9–10.3)
Chloride: 106 mmol/L (ref 98–111)
Creatinine, Ser: 1.02 mg/dL — ABNORMAL HIGH (ref 0.44–1.00)
GFR, Estimated: >60 mL/min (ref >60)
Glucose, Bld: 279 mg/dL — ABNORMAL HIGH (ref 70–99)
Potassium: 3.7 mmol/L (ref 3.5–5.1)
Sodium: 138 mmol/L (ref 135–145)

## 2023-09-24 LAB — GLUCOSE, CAPILLARY
Glucose-Capillary: 334 mg/dL — ABNORMAL HIGH (ref 70–99)
Glucose-Capillary: 244 mg/dL — ABNORMAL HIGH (ref 70–99)
Glucose-Capillary: 346 mg/dL — ABNORMAL HIGH (ref 70–99)
Glucose-Capillary: 248 mg/dL — ABNORMAL HIGH (ref 70–99)

## 2023-09-24 LAB — CBC
HCT: 41.3 % (ref 36.0–46.0)
Hemoglobin: 13.1 g/dL (ref 12.0–15.0)
MCH: 27 pg (ref 26.0–34.0)
MCHC: 31.7 g/dL (ref 30.0–36.0)
MCV: 85.2 fL (ref 80.0–100.0)
Platelets: 197 10*3/uL (ref 150–400)
RBC: 4.85 MIL/uL (ref 3.87–5.11)
RDW: 15.6 % — ABNORMAL HIGH (ref 11.5–15.5)
WBC: 4.7 10*3/uL (ref 4.0–10.5)
nRBC: 0 % (ref 0.0–0.2)

## 2023-09-24 LAB — MAGNESIUM: Magnesium: 1.7 mg/dL (ref 1.7–2.4)

## 2023-09-24 MED ORDER — POTASSIUM CHLORIDE CRYS ER 20 MEQ PO TBCR
40.0000 meq | EXTENDED_RELEASE_TABLET | Freq: Once | ORAL | Status: AC
  Administered 2023-09-24: 40 meq via ORAL
  Filled 2023-09-24: qty 2

## 2023-09-24 MED ORDER — MAGNESIUM SULFATE 2 GM/50ML IV SOLN
2.0000 g | Freq: Once | INTRAVENOUS | Status: AC
Start: 2023-09-24 — End: 2023-09-24
  Administered 2023-09-24: 2 g via INTRAVENOUS
  Filled 2023-09-24: qty 50

## 2023-09-24 MED ORDER — SPIRONOLACTONE 12.5 MG HALF TABLET
12.5000 mg | ORAL_TABLET | Freq: Every day | ORAL | Status: DC
  Administered 2023-09-24 – 2023-09-27 (×4): 12.5 mg via ORAL
  Filled 2023-09-24 (×4): qty 1

## 2023-09-24 NOTE — Progress Notes (Signed)
 Progress Note   Patient: Julie [name] ZOX:[id number] DOB: [birth year] DOA: 09/23/2023     0 DOS: the patient was seen and examined on 09/24/2023   Brief hospital course: Julie [name] was admitted to the hospital with the working diagnosis of heart failure exacerbation.   48 yo female with the past medical history of polysubstance abuse, hypertension, asthma/ COPD, heart failure, obesity and depression who presented with dyspnea.  Patient was hospitalized for heart failure exacerbation on 04/01 and left against medical advice on 09/22/23. Apparently at home she used crack cocaine, triggering respiratory distress and chest pain. She called EMS. Patient was found with increased work of breathing, was placed on Cpap and was transported to the ED.  On her initial physical examination her blood pressure was 156/105, HR 107, RR 31 and 02 saturation 99% (bipap)  Lungs with no wheezing or rhonchi, heart with S1 and S2 present and regular with no gallops, rubs or murmurs, abdomen with no distention and no lower extremity edema.   Na 143, K 3,6 Cl 109, bicarbonate 23, glucose 185 bun 10 cr 0,67  Mg 1,6  BNP 409  High sensitive troponin 30 and 39  Wbc 9.0 hgb 13.0 plt 209  Toxicology screen positive for cocaine and tetrahydrocannabinol.   Chest radiograph with hypoinflation, cardiomegaly with bilateral hilar vascular congestion, bilateral central interstitial infiltrates.   EKG 103 bpm, normal axis, normal intervals, qtc 494, sinus rhythm, right atrial enlargement, with J point elevation V1 and V2, no significant ST segment changes, with negative T wave lead I and aVL.   Assessment and Plan: * Acute on chronic diastolic CHF (congestive heart failure) (HCC) Echocardiogram with preserved LV systolic function with EF 50 to 55%, mild LVH, RV with normal systolic function, LA with mild dilatation, no significant valvular disease.   Clinically volume has improved.   Continue with losartan 50 mg and will  add spironolactone.  Change furosemide to po tomorrow.  Patient will need close follow up as outpatient.   Asthma, chronic No signs of acute exacerbation.  Continue oxymetry   Hypomagnesemia Renal function with serum cr at 1,0 with K at 3,7 and serum bicarbonate at 22  Na 138 and Mg 1,7   Plan to add 40 meq Kcl and 2 g mag sulfate.  Follow up renal function in am.   Costochondritis Continue with ketorolac for pain control.  Will avoid opiates.   History of essential hypertension Hypertensive emergency.   Continue blood pressure control with losartan  Add spironolactone.  Change to oral furosemide.   Uncontrolled type 2 diabetes mellitus with hyperglycemia, without long-term current use of insulin (HCC) Recent A1C of 8.5  Hold metformin  Continue insulin sliding scale for glucose cover and monitoring   Cocaine use disorder, severe, dependence (HCC) Positive cocaine and THC on admission   Continuous dependence on cigarette smoking Declines nicotine patch  Cessation encouraged       Subjective: Patient is feeling better, her dyspnea has improved, no edema, PND or orthopnea.  Positive back pain  Physical Exam: Vitals:   09/24/23 0612 09/24/23 0613 09/24/23 0614 09/24/23 0615  BP:      Pulse: (!) 113 (!) 115 (!) 113 (!) 111  Resp:      Temp:      TempSrc:      SpO2: 93% 94% 94% 93%  Weight:      Height:       Neurology awake and alert ENT with mild pallor  Cardiovascular with  S1 and S2 present and regular with no gallops, rubs or murmurs No JVD Respiratory with no rales or wheezing, no rhonchi  Abdomen with no distention  No lower extremity edema   Data Reviewed:   Family Communication: friend/ family at the bedside   Disposition: Status is: Observation The patient remains OBS appropriate and will d/c before 2 midnights.  Planned Discharge Destination: Home    Author: Coralie Keens, MD 09/24/2023 7:32 AM  For on call review  [email or web address].

## 2023-09-24 NOTE — Progress Notes (Signed)
 RT was called by RN to assess after giving pt a PRN breathing treatment. Pt complaint that she still can't breath. RT tried to assess pt but pt was very rude to staff and told everyone to get out of the room. PT is on RA with no distress. Pt refused to wear bipap. Will continue to monitor.

## 2023-09-24 NOTE — Plan of Care (Signed)
  Problem: Coping: Goal: Ability to adjust to condition or change in health will improve Outcome: Progressing   Problem: Safety: Goal: Ability to remain free from injury will improve Outcome: Progressing   Problem: Education: Goal: Ability to demonstrate management of disease process will improve Outcome: Progressing   Problem: Education: Goal: Ability to verbalize understanding of medication therapies will improve Outcome: Progressing

## 2023-09-24 NOTE — Hospital Course (Signed)
 Julie [name] was admitted to the hospital with the working diagnosis of heart failure exacerbation.   48 yo female with the past medical history of polysubstance abuse, hypertension, asthma/ COPD, heart failure, obesity and depression who presented with dyspnea.  Patient was hospitalized for heart failure exacerbation on 04/01 and left against medical advice on 09/22/23. Apparently at home she used crack cocaine, triggering respiratory distress and chest pain. She called EMS. Patient was found with increased work of breathing, was placed on Cpap and was transported to the ED.  On her initial physical examination her blood pressure was 156/105, HR 107, RR 31 and 02 saturation 99% (bipap)  Lungs with no wheezing or rhonchi, heart with S1 and S2 present and regular with no gallops, rubs or murmurs, abdomen with no distention and no lower extremity edema.   Na 143, K 3,6 Cl 109, bicarbonate 23, glucose 185 bun 10 cr 0,67  Mg 1,6  BNP 409  High sensitive troponin 30 and 39  Wbc 9.0 hgb 13.0 plt 209  Toxicology screen positive for cocaine and tetrahydrocannabinol.   Chest radiograph with hypoinflation, cardiomegaly with bilateral hilar vascular congestion, bilateral central interstitial infiltrates.   EKG 103 bpm, normal axis, normal intervals, qtc 494, sinus rhythm, right atrial enlargement, with J point elevation V1 and V2, no significant ST segment changes, with negative T wave lead I and aVL.   04/06 continue volume overloaded, will continue with IV furosemide.  04/07 improved volume status.  04/08 patient will get her medications from transition of care pharmacy and instructions to follow up with primary care.

## 2023-09-24 NOTE — Progress Notes (Signed)
 Patient complained shortness of breath. RN offered and gave breathing treatment. O2 sat 100 % on RA. Respiratory therapist informed as well. Patient claimed 10/10 pain on her back and wondering if she can have an imaging study and she thinks there's something wrong in her kidneys. MD on call informed. 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 RN informed patient about the MD's response and gave her due PRN pain medicine. RN was down the hall when the patient starts to scream and yell. RN went in the room and talked to the patient. RN explained that breathing treatment was given and respiratory therapist in her way, pain medicine was given and suddenly patient starts to scream "it's not doing anything and get out from my room". RN informed Press photographer about the incident. Charge Nurse went in the room and talked to the patient. AC and security made aware.

## 2023-09-25 DIAGNOSIS — E1165 Type 2 diabetes mellitus with hyperglycemia: Secondary | ICD-10-CM | POA: Diagnosis present

## 2023-09-25 DIAGNOSIS — I161 Hypertensive emergency: Secondary | ICD-10-CM | POA: Diagnosis present

## 2023-09-25 DIAGNOSIS — R0603 Acute respiratory distress: Secondary | ICD-10-CM | POA: Diagnosis present

## 2023-09-25 DIAGNOSIS — F332 Major depressive disorder, recurrent severe without psychotic features: Secondary | ICD-10-CM | POA: Diagnosis present

## 2023-09-25 DIAGNOSIS — M940 Chondrocostal junction syndrome [Tietze]: Secondary | ICD-10-CM | POA: Diagnosis present

## 2023-09-25 DIAGNOSIS — Z89422 Acquired absence of other left toe(s): Secondary | ICD-10-CM | POA: Diagnosis not present

## 2023-09-25 DIAGNOSIS — J45909 Unspecified asthma, uncomplicated: Secondary | ICD-10-CM | POA: Diagnosis not present

## 2023-09-25 DIAGNOSIS — F1420 Cocaine dependence, uncomplicated: Secondary | ICD-10-CM | POA: Diagnosis present

## 2023-09-25 DIAGNOSIS — F17210 Nicotine dependence, cigarettes, uncomplicated: Secondary | ICD-10-CM | POA: Diagnosis present

## 2023-09-25 DIAGNOSIS — I509 Heart failure, unspecified: Secondary | ICD-10-CM

## 2023-09-25 DIAGNOSIS — Z5900 Homelessness unspecified: Secondary | ICD-10-CM | POA: Diagnosis not present

## 2023-09-25 DIAGNOSIS — I5033 Acute on chronic diastolic (congestive) heart failure: Secondary | ICD-10-CM | POA: Diagnosis present

## 2023-09-25 DIAGNOSIS — Z6835 Body mass index (BMI) 35.0-35.9, adult: Secondary | ICD-10-CM | POA: Diagnosis not present

## 2023-09-25 DIAGNOSIS — E669 Obesity, unspecified: Secondary | ICD-10-CM | POA: Diagnosis present

## 2023-09-25 DIAGNOSIS — F1290 Cannabis use, unspecified, uncomplicated: Secondary | ICD-10-CM | POA: Diagnosis present

## 2023-09-25 DIAGNOSIS — T405X1A Poisoning by cocaine, accidental (unintentional), initial encounter: Secondary | ICD-10-CM | POA: Diagnosis present

## 2023-09-25 DIAGNOSIS — I110 Hypertensive heart disease with heart failure: Secondary | ICD-10-CM | POA: Diagnosis present

## 2023-09-25 DIAGNOSIS — Z716 Tobacco abuse counseling: Secondary | ICD-10-CM | POA: Diagnosis not present

## 2023-09-25 DIAGNOSIS — M549 Dorsalgia, unspecified: Secondary | ICD-10-CM | POA: Diagnosis present

## 2023-09-25 DIAGNOSIS — J4489 Other specified chronic obstructive pulmonary disease: Secondary | ICD-10-CM | POA: Diagnosis present

## 2023-09-25 LAB — BASIC METABOLIC PANEL WITH GFR
Anion gap: 7 (ref 5–15)
BUN: 13 mg/dL (ref 6–20)
CO2: 26 mmol/L (ref 22–32)
Calcium: 9.6 mg/dL (ref 8.9–10.3)
Chloride: 106 mmol/L (ref 98–111)
Creatinine, Ser: 0.8 mg/dL (ref 0.44–1.00)
GFR, Estimated: >60 mL/min (ref >60)
Glucose, Bld: 311 mg/dL — ABNORMAL HIGH (ref 70–99)
Potassium: 4 mmol/L (ref 3.5–5.1)
Sodium: 139 mmol/L (ref 135–145)

## 2023-09-25 LAB — MAGNESIUM: Magnesium: 1.7 mg/dL (ref 1.7–2.4)

## 2023-09-25 LAB — GLUCOSE, CAPILLARY
Glucose-Capillary: 243 mg/dL — ABNORMAL HIGH (ref 70–99)
Glucose-Capillary: 283 mg/dL — ABNORMAL HIGH (ref 70–99)
Glucose-Capillary: 236 mg/dL — ABNORMAL HIGH (ref 70–99)
Glucose-Capillary: 248 mg/dL — ABNORMAL HIGH (ref 70–99)

## 2023-09-25 MED ORDER — INSULIN GLARGINE-YFGN 100 UNIT/ML ~~LOC~~ SOLN
10.0000 [IU] | Freq: Every day | SUBCUTANEOUS | Status: DC
  Administered 2023-09-25: 10 [IU] via SUBCUTANEOUS
  Filled 2023-09-25 (×2): qty 0.1

## 2023-09-25 MED ORDER — MAGNESIUM SULFATE 2 GM/50ML IV SOLN
2.0000 g | Freq: Once | INTRAVENOUS | Status: AC
  Administered 2023-09-25: 2 g via INTRAVENOUS
  Filled 2023-09-25: qty 50

## 2023-09-25 MED ORDER — POTASSIUM CHLORIDE CRYS ER 20 MEQ PO TBCR
40.0000 meq | EXTENDED_RELEASE_TABLET | Freq: Once | ORAL | Status: AC
  Administered 2023-09-25: 40 meq via ORAL
  Filled 2023-09-25: qty 2

## 2023-09-25 MED ORDER — TRAMADOL HCL 50 MG PO TABS
50.0000 mg | ORAL_TABLET | Freq: Four times a day (QID) | ORAL | Status: DC | PRN
  Administered 2023-09-25 – 2023-09-26 (×5): 50 mg via ORAL
  Filled 2023-09-25 (×5): qty 1

## 2023-09-25 MED ORDER — FUROSEMIDE 10 MG/ML IJ SOLN
40.0000 mg | Freq: Two times a day (BID) | INTRAMUSCULAR | Status: DC
  Administered 2023-09-25 – 2023-09-26 (×3): 40 mg via INTRAVENOUS
  Filled 2023-09-25 (×3): qty 4

## 2023-09-25 NOTE — Progress Notes (Signed)
 Progress Note   Patient: Julie [name] WJX:[id number] DOB: 1975-10-12 DOA: 09/23/2023     0 DOS: the patient was seen and examined on 09/25/2023   Brief hospital course: Julie [name] was admitted to the hospital with the working diagnosis of heart failure exacerbation.   48 yo female with the past medical history of polysubstance abuse, hypertension, asthma/ COPD, heart failure, obesity and depression who presented with dyspnea.  Patient was hospitalized for heart failure exacerbation on 04/01 and left against medical advice on 09/22/23. Apparently at home she used crack cocaine, triggering respiratory distress and chest pain. She called EMS. Patient was found with increased work of breathing, was placed on Cpap and was transported to the ED.  On her initial physical examination her blood pressure was 156/105, HR 107, RR 31 and 02 saturation 99% (bipap)  Lungs with no wheezing or rhonchi, heart with S1 and S2 present and regular with no gallops, rubs or murmurs, abdomen with no distention and no lower extremity edema.   Na 143, K 3,6 Cl 109, bicarbonate 23, glucose 185 bun 10 cr 0,67  Mg 1,6  BNP 409  High sensitive troponin 30 and 39  Wbc 9.0 hgb 13.0 plt 209  Toxicology screen positive for cocaine and tetrahydrocannabinol.   Chest radiograph with hypoinflation, cardiomegaly with bilateral hilar vascular congestion, bilateral central interstitial infiltrates.   EKG 103 bpm, normal axis, normal intervals, qtc 494, sinus rhythm, right atrial enlargement, with J point elevation V1 and V2, no significant ST segment changes, with negative T wave lead I and aVL.   04/06 continue volume overloaded, will continue with IV furosemide.   Assessment and Plan: * Acute on chronic diastolic CHF (congestive heart failure) (HCC) Echocardiogram with preserved LV systolic function with EF 50 to 55%, mild LVH, RV with normal systolic function, LA with mild dilatation, no significant valvular disease.    Today with signs of hypervolemia, her weight is up 4 Kg.   Plan to resume IV furosemide 40 mg IV bid to target a further negative fluid balance. Continue spironolactone and losartan.  Possible addition of SGLT 2 inh, but concerned about compliance with multiple medical therapy.    Asthma, chronic No signs of acute exacerbation. Wheezing is likely due to pulmonary edema  Continue oxymetry   Hypomagnesemia Pending renal function today. Keep K at 4 and Mg at 2    Costochondritis Hold on Ketorolac, she will need further diuresis and risk of AKI.  Will continue acetaminophen as needed.  Avoid opiates analgesics due to substance abuse   Essential hypertension Hypertensive emergency.   Continue blood pressure control with losartan and spironolactone.   Uncontrolled type 2 diabetes mellitus with hyperglycemia, without long-term current use of insulin (HCC) Recent A1C of 8.5  Hold metformin  Continue insulin sliding scale for glucose cover and monitoring   Cocaine use disorder, severe, dependence (HCC) Positive cocaine and THC on admission   Continuous dependence on cigarette smoking Declines nicotine patch  Cessation encouraged       Subjective: Patient today with worsening dyspnea and wheezing, no chest pain.   Physical Exam: Vitals:   09/24/23 1643 09/25/23 0445 09/25/23 0500 09/25/23 0822  BP:  (!) 159/95  139/80  Pulse: 98 98  97  Resp: 20   18  Temp: 98.3 F (36.8 C) 98.1 F (36.7 C)  98.1 F (36.7 C)  TempSrc: Oral Oral  Oral  SpO2: 100% 93%  100%  Weight:   114.4 kg   Height:  Neurology awake and alert ENT with mild pallor  Cardiovascular with S1 and S2 present and regular with no gallops rubs or murmurs Positive moderate JVD Trace lower extremity edema Respiratory with positive expiratory wheezing and scattered rales with no rhonchi  Abdomen protuberant but not distended   Data Reviewed:    Family Communication: friend/ family at the  bedside   Disposition: Status is: Observation The patient will require care spanning > 2 midnights and should be moved to inpatient because: IV diuresis   Planned Discharge Destination: Home    Author: Coralie Keens, MD 09/25/2023 11:24 AM  For on call review [email or web address].

## 2023-09-26 ENCOUNTER — Telehealth (HOSPITAL_COMMUNITY): Payer: Self-pay | Admitting: Pharmacy Technician

## 2023-09-26 ENCOUNTER — Other Ambulatory Visit (HOSPITAL_COMMUNITY): Payer: Self-pay

## 2023-09-26 DIAGNOSIS — M940 Chondrocostal junction syndrome [Tietze]: Secondary | ICD-10-CM | POA: Diagnosis not present

## 2023-09-26 DIAGNOSIS — I5033 Acute on chronic diastolic (congestive) heart failure: Secondary | ICD-10-CM | POA: Diagnosis not present

## 2023-09-26 DIAGNOSIS — J45909 Unspecified asthma, uncomplicated: Secondary | ICD-10-CM | POA: Diagnosis not present

## 2023-09-26 LAB — BASIC METABOLIC PANEL WITH GFR
Anion gap: 4 — ABNORMAL LOW (ref 5–15)
BUN: 12 mg/dL (ref 6–20)
CO2: 29 mmol/L (ref 22–32)
Calcium: 10 mg/dL (ref 8.9–10.3)
Chloride: 107 mmol/L (ref 98–111)
Creatinine, Ser: 0.68 mg/dL (ref 0.44–1.00)
GFR, Estimated: >60 mL/min (ref >60)
Glucose, Bld: 217 mg/dL — ABNORMAL HIGH (ref 70–99)
Potassium: 4.1 mmol/L (ref 3.5–5.1)
Sodium: 140 mmol/L (ref 135–145)

## 2023-09-26 LAB — GLUCOSE, CAPILLARY
Glucose-Capillary: 223 mg/dL — ABNORMAL HIGH (ref 70–99)
Glucose-Capillary: 260 mg/dL — ABNORMAL HIGH (ref 70–99)
Glucose-Capillary: 200 mg/dL — ABNORMAL HIGH (ref 70–99)
Glucose-Capillary: 241 mg/dL — ABNORMAL HIGH (ref 70–99)

## 2023-09-26 LAB — MAGNESIUM: Magnesium: 1.6 mg/dL — ABNORMAL LOW (ref 1.7–2.4)

## 2023-09-26 MED ORDER — INSULIN GLARGINE-YFGN 100 UNIT/ML ~~LOC~~ SOLN
15.0000 [IU] | Freq: Every day | SUBCUTANEOUS | Status: DC
  Administered 2023-09-26: 15 [IU] via SUBCUTANEOUS
  Filled 2023-09-26 (×2): qty 0.15

## 2023-09-26 MED ORDER — MAGNESIUM OXIDE -MG SUPPLEMENT 400 (240 MG) MG PO TABS
400.0000 mg | ORAL_TABLET | Freq: Two times a day (BID) | ORAL | Status: DC
  Administered 2023-09-26 – 2023-09-27 (×2): 400 mg via ORAL
  Filled 2023-09-26 (×2): qty 1

## 2023-09-26 MED ORDER — POTASSIUM CHLORIDE CRYS ER 20 MEQ PO TBCR
20.0000 meq | EXTENDED_RELEASE_TABLET | Freq: Once | ORAL | Status: AC
  Administered 2023-09-26: 20 meq via ORAL
  Filled 2023-09-26: qty 1

## 2023-09-26 MED ORDER — MAGNESIUM SULFATE 4 GM/100ML IV SOLN
4.0000 g | Freq: Once | INTRAVENOUS | Status: AC
  Administered 2023-09-26: 4 g via INTRAVENOUS
  Filled 2023-09-26: qty 100

## 2023-09-26 NOTE — Telephone Encounter (Signed)
 Pharmacy Patient Advocate Encounter   Received notification from Inpatient Request that prior authorization for Farxiga 10MG  tablets is required/requested.   Insurance verification completed.   The patient is insured through [facility] .   Per test claim: PA required; PA submitted to above mentioned insurance via CoverMyMeds Key/confirmation #/EOC [id number] Status is pending

## 2023-09-26 NOTE — Progress Notes (Signed)
 Heart Failure Navigator Progress Note  Assessed for Heart & Vascular TOC clinic readiness.  Patient does not meet criteria per Dr. Ella Jubilee, no TOC scheduled at this time.   Navigator available for reassessment of patient.   Sharen Hones, PharmD, BCPS Heart Failure Stewardship Pharmacist Phone [phone]

## 2023-09-26 NOTE — Inpatient Diabetes Management (Addendum)
 Inpatient Diabetes Program Recommendations  AACE/ADA: New Consensus Statement on Inpatient Glycemic Control (2015)  Target Ranges:  Prepandial:   less than 140 mg/dL      Peak postprandial:   less than 180 mg/dL (1-2 hours)      Critically ill patients:  140 - 180 mg/dL    Latest Reference Range & Units 08/22/23 04:14  Hemoglobin A1C 4.8 - 5.6 % 8.5 (H)  (H): Data is abnormally high  Latest Reference Range & Units 09/25/23 06:12 09/25/23 11:09 09/25/23 15:58 09/25/23 21:26  Glucose-Capillary 70 - 99 mg/dL 161 (H)  5 units Novolog  283 (H)  8 units Novolog  236 (H)  5 units Novolog  248 (H)   10 units Semglee  (H): Data is abnormally high  Latest Reference Range & Units 09/26/23 06:08  Glucose-Capillary 70 - 99 mg/dL 096 (H)  5 units Novolog   (H): Data is abnormally high   Admit with: Acute on chronic diastolic CHF   History: DM, Polysubstance Abuse (Cocaine + on admission)  Home DM Meds: Metformin 500 mg BID (NOT taking)  Current Orders: Semglee 10 units at bedtime     Novolog Moderate Correction Scale/ SSI (0-15 units) TID AC     MD- Note Semglee insulin started last PM.   Per Home Med Rec, pt NOT taking the Metformin as outpatient--Records state pt never filled the Rx.  Please consider:  1. Increase Semglee to 15 units at bedtime (CBG 223 this AM)  2 Start Novolog Meal Coverage: Novolog 3 units TID with meals HOLD if pt NPO HOLD if pt eats <50% meals  3. Please re-order Metformin for discharge (may be best to use Meds to Bed at time of d/c from [facility])    --Will follow patient during hospitalization--  Ambrose Finland RN, MSN, CDCES Diabetes Coordinator Inpatient Glycemic Control Team Team Pager: [phone] (8a-5p)
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
## 2023-09-26 NOTE — Plan of Care (Signed)
  Problem: Education: Goal: Ability to describe self-care measures that may prevent or decrease complications (Diabetes Survival Skills Education) will improve Outcome: Progressing   Problem: Skin Integrity: Goal: Risk for impaired skin integrity will decrease Outcome: Progressing   Problem: Education: Goal: Knowledge of General Education information will improve Description: Including pain rating scale, medication(s)/side effects and non-pharmacologic comfort measures Outcome: Progressing   Problem: Activity: Goal: Risk for activity intolerance will decrease Outcome: Progressing

## 2023-09-26 NOTE — Progress Notes (Addendum)
 Progress Note   Patient: Julie [name] ZOX:[id number] DOB: Aug 08, 1975 DOA: 09/23/2023     1 DOS: the patient was seen and examined on 09/26/2023   Brief hospital course: Mrs. Brindley was admitted to the hospital with the working diagnosis of heart failure exacerbation.   48 yo female with the past medical history of polysubstance abuse, hypertension, asthma/ COPD, heart failure, obesity and depression who presented with dyspnea.  Patient was hospitalized for heart failure exacerbation on 04/01 and left against medical advice on 09/22/23. Apparently at home she used crack cocaine, triggering respiratory distress and chest pain. She called EMS. Patient was found with increased work of breathing, was placed on Cpap and was transported to the ED.  On her initial physical examination her blood pressure was 156/105, HR 107, RR 31 and 02 saturation 99% (bipap)  Lungs with no wheezing or rhonchi, heart with S1 and S2 present and regular with no gallops, rubs or murmurs, abdomen with no distention and no lower extremity edema.   Na 143, K 3,6 Cl 109, bicarbonate 23, glucose 185 bun 10 cr 0,67  Mg 1,6  BNP 409  High sensitive troponin 30 and 39  Wbc 9.0 hgb 13.0 plt 209  Toxicology screen positive for cocaine and tetrahydrocannabinol.   Chest radiograph with hypoinflation, cardiomegaly with bilateral hilar vascular congestion, bilateral central interstitial infiltrates.   EKG 103 bpm, normal axis, normal intervals, qtc 494, sinus rhythm, right atrial enlargement, with J point elevation V1 and V2, no significant ST segment changes, with negative T wave lead I and aVL.   04/06 continue volume overloaded, will continue with IV furosemide.  04/07 improved volume status.   Assessment and Plan: * Acute on chronic diastolic CHF (congestive heart failure) (HCC) Echocardiogram with preserved LV systolic function with EF 50 to 55%, mild LVH, RV with normal systolic function, LA with mild dilatation, no  significant valvular disease.   Urine output is 3,775 ml Systolic blood pressure 150 mmHg range.   Patient had one dose of IV furosemide this am, will transition to po loop diuretic in am.  Continue spironolactone and losartan.  Possible addition of SGLT 2 inh, but concerned about compliance with multiple medical therapy.    Asthma, chronic No signs of acute exacerbation.  Continue oxymetry   Hypomagnesemia Renal function today with serum cr at 0,68 with K at 4,1 and serum bicarbonate at 29  Na 140 and Mg 1.6   Plan to add 20 meq Kcl to avoid hypokalemia, and 4 mag sulfate IV for hypomagnesemia    Costochondritis Continue pain control with acetaminophen and tramadol.   Essential hypertension Hypertensive emergency.   Continue blood pressure control with losartan and spironolactone.   Uncontrolled type 2 diabetes mellitus with hyperglycemia, without long-term current use of insulin (HCC) Recent A1C of 8.5  Hold metformin  Continue insulin sliding scale for glucose cover and monitoring  Add basal insulin for better glucose control, she had 18 units of aspart insulin yesterday.  Will increase basal insulin with 15 units.   Cocaine use disorder, severe, dependence (HCC) Positive cocaine and THC on admission   Continuous dependence on cigarette smoking Declines nicotine patch  Cessation encouraged       Subjective: patient with improvement in dyspnea, no chest pain, no PND or orthopnea, no further wheezing   Physical Exam: Vitals:   09/26/23 0358 09/26/23 0400 09/26/23 0951 09/26/23 1132  BP: (!) 154/82  (!) 150/86 (!) 159/87  Pulse: 85   80  Resp: 17   18  Temp: 98.1 F (36.7 C)   98 F (36.7 C)  TempSrc: Oral   Oral  SpO2: 100%   97%  Weight:  112.2 kg    Height:       Neurology awake and alert ENT with mild pallor  Cardiovascular with S1 and S2 present and regular with no gallops, rubs or murmurs No JVD No lower extremity edema Respiratory with no rales  or wheezing, no rhonchi  Abdomen with no distention and non tender  Data Reviewed:    Family Communication: family/ friend at the bedside   Disposition: Status is: Inpatient Remains inpatient appropriate because: diuresis with plan for discharge tomorrow   Planned Discharge Destination: Home     Author: Coralie Keens, MD 09/26/2023 3:27 PM  For on call review [email or web address].

## 2023-09-26 NOTE — Plan of Care (Signed)
   Problem: Fluid Volume: Goal: Ability to maintain a balanced intake and output will improve Outcome: Progressing   Problem: Nutritional: Goal: Maintenance of adequate nutrition will improve Outcome: Progressing

## 2023-09-26 NOTE — Telephone Encounter (Signed)
 Patient Product/process development scientist completed.    The patient is insured through [employer] [place].     Ran test claim for Farxiga 10 mg and Requires Prior Authorizaiton  Ran test claim for Jardiance 10 mg and Requires Prior Authorizaiton  This test claim was processed through [employer]- copay amounts may vary at other pharmacies due to [employer], or as the patient moves through the different stages of their insurance plan.     Roland Earl, CPHT Pharmacy Technician III Certified Patient Advocate [facility] Patient Advocate Team Direct Number: [phone]  Fax: [phone]

## 2023-09-26 NOTE — Telephone Encounter (Signed)
 Pharmacy Patient Advocate Encounter  Received notification from [facility] that Prior Authorization for Farxiga 10MG  tablets  has been APPROVED from 09/26/2023 to 09/25/2024. Ran test claim, Copay is $4.00. This test claim was processed through [employer]- copay amounts may vary at other pharmacies due to pharmacy/plan contracts, or as the patient moves through the different stages of their insurance plan.   PA #/Case ID/Reference #: [id number]

## 2023-09-27 ENCOUNTER — Other Ambulatory Visit (HOSPITAL_COMMUNITY): Payer: Self-pay

## 2023-09-27 DIAGNOSIS — M940 Chondrocostal junction syndrome [Tietze]: Secondary | ICD-10-CM | POA: Diagnosis not present

## 2023-09-27 DIAGNOSIS — I5033 Acute on chronic diastolic (congestive) heart failure: Secondary | ICD-10-CM | POA: Diagnosis not present

## 2023-09-27 DIAGNOSIS — J45909 Unspecified asthma, uncomplicated: Secondary | ICD-10-CM | POA: Diagnosis not present

## 2023-09-27 LAB — BASIC METABOLIC PANEL WITH GFR
Anion gap: 9 (ref 5–15)
BUN: 13 mg/dL (ref 6–20)
CO2: 27 mmol/L (ref 22–32)
Calcium: 10.1 mg/dL (ref 8.9–10.3)
Chloride: 102 mmol/L (ref 98–111)
Creatinine, Ser: 0.7 mg/dL (ref 0.44–1.00)
GFR, Estimated: >60 mL/min (ref >60)
Glucose, Bld: 218 mg/dL — ABNORMAL HIGH (ref 70–99)
Potassium: 4.2 mmol/L (ref 3.5–5.1)
Sodium: 138 mmol/L (ref 135–145)

## 2023-09-27 LAB — GLUCOSE, CAPILLARY
Glucose-Capillary: 251 mg/dL — ABNORMAL HIGH (ref 70–99)
Glucose-Capillary: 221 mg/dL — ABNORMAL HIGH (ref 70–99)

## 2023-09-27 LAB — MAGNESIUM: Magnesium: 1.8 mg/dL (ref 1.7–2.4)

## 2023-09-27 MED ORDER — SPIRONOLACTONE 25 MG PO TABS
12.5000 mg | ORAL_TABLET | Freq: Every day | ORAL | 0 refills | Status: DC
  Filled 2023-09-27: qty 15, 30d supply, fill #0

## 2023-09-27 MED ORDER — LOSARTAN POTASSIUM 50 MG PO TABS
50.0000 mg | ORAL_TABLET | Freq: Every day | ORAL | 0 refills | Status: DC
  Filled 2023-09-27: qty 30, 30d supply, fill #0

## 2023-09-27 MED ORDER — FUROSEMIDE 20 MG PO TABS
20.0000 mg | ORAL_TABLET | Freq: Every day | ORAL | 0 refills | Status: DC | PRN
  Filled 2023-09-27: qty 30, 30d supply, fill #0

## 2023-09-27 MED ORDER — FUROSEMIDE 20 MG PO TABS: 20.0000 mg | ORAL_TABLET | Freq: Every day | ORAL | Status: DC | PRN

## 2023-09-27 MED ORDER — METFORMIN HCL 500 MG PO TABS
500.0000 mg | ORAL_TABLET | Freq: Two times a day (BID) | ORAL | 0 refills | Status: DC
  Filled 2023-09-27: qty 60, 30d supply, fill #0

## 2023-09-27 NOTE — Discharge Summary (Signed)
 Physician Discharge Summary   Patient: Julie [name] MRN: [id number] DOB: 20-Sep-1975  Admit date:     09/23/2023  Discharge date: 09/27/23  Discharge Physician: York Ram Denijah Karrer   PCP: Pcp, No   Recommendations at discharge:    Patient was placed on losartan and spironolactone for heart failure.  Limited guideline directed therapy due to poor compliance and financial constrains.  Resume metformin for glucose control Follow up renal function and electrolytes in 7 days as outpatient Follow up with Primary Care in 7 to 10 days.    Discharge Diagnoses: Principal Problem:   Acute on chronic diastolic CHF (congestive heart failure) (HCC) Active Problems:   Asthma, chronic   Costochondritis   Hypomagnesemia   Essential hypertension   Uncontrolled type 2 diabetes mellitus with hyperglycemia, without long-term current use of insulin (HCC)   Cocaine use disorder, severe, dependence (HCC)   Continuous dependence on cigarette smoking   Heart failure (HCC)  Resolved Problems:   MDD (major depressive disorder), recurrent severe, without psychosis [facility])  Hospital Course: Julie [name] was admitted to the hospital with the working diagnosis of heart failure exacerbation.   48 yo female with the past medical history of polysubstance abuse, hypertension, asthma/ COPD, heart failure, obesity and depression who presented with dyspnea.  Patient was hospitalized for heart failure exacerbation on 04/01 and left against medical advice on 09/22/23. Apparently at home she used crack cocaine, triggering respiratory distress and chest pain. She called EMS. Patient was found with increased work of breathing, was placed on Cpap and was transported to the ED.  On her initial physical examination her blood pressure was 156/105, HR 107, RR 31 and 02 saturation 99% (bipap)  Lungs with no wheezing or rhonchi, heart with S1 and S2 present and regular with no gallops, rubs or murmurs, abdomen with no  distention and no lower extremity edema.   Na 143, K 3,6 Cl 109, bicarbonate 23, glucose 185 bun 10 cr 0,67  Mg 1,6  BNP 409  High sensitive troponin 30 and 39  Wbc 9.0 hgb 13.0 plt 209  Toxicology screen positive for cocaine and tetrahydrocannabinol.   Chest radiograph with hypoinflation, cardiomegaly with bilateral hilar vascular congestion, bilateral central interstitial infiltrates.   EKG 103 bpm, normal axis, normal intervals, qtc 494, sinus rhythm, right atrial enlargement, with J point elevation V1 and V2, no significant ST segment changes, with negative T wave lead I and aVL.   04/06 continue volume overloaded, will continue with IV furosemide.  04/07 improved volume status.  04/08 patient will get her medications from transition of care pharmacy and instructions to follow up with primary care.   Assessment and Plan: * Acute on chronic diastolic CHF (congestive heart failure) (HCC) Echocardiogram with preserved LV systolic function with EF 50 to 55%, mild LVH, RV with normal systolic function, LA with mild dilatation, no significant valvular disease.   Patient was placed on IV furosemide for diuresis, negative fluid balance was achieved, -  5,542 ml, with significant improvement in her symptoms.   Continue spironolactone and losartan and as needed furosemide.  Possible addition of SGLT 2 inh, but concerned about compliance with multiple medical therapy.   No B blocker due to cocaine use.  Will need close follow up as outpatient.   Asthma, chronic No signs of acute exacerbation.  Continue oxymetry   Hypomagnesemia Her renal function remained stable, at the time of her discharge her serum cr is 0,70 with K at 4,2 and serum bicarbonate  at 27  Na 138  Mg 1,8   Plan to continue blood pressure control and close follow up as outpatient. Use furosemide as needed.   Costochondritis Patient had pain control with acetaminophen and tramadol.   Essential hypertension Hypertensive  emergency.   Continue blood pressure control with losartan and spironolactone.  Follow up blood pressure as outpatient.   Uncontrolled type 2 diabetes mellitus with hyperglycemia, without long-term current use of insulin (HCC) Recent A1C of 8.5   Patient required insulin sliding scale for glucose cover and monitoring  Plus basal insulin .  At the time of her discharge will continue with metformin.  Will need close follow up.   Cocaine use disorder, severe, dependence (HCC) Positive cocaine and THC on admission   Continuous dependence on cigarette smoking Smoking cessation          Consultants: none  Procedures performed: none   Disposition: Home Diet recommendation:  Cardiac diet DISCHARGE MEDICATION: Allergies as of 09/27/2023   No Known Allergies      Medication List     STOP taking these medications    pantoprazole 40 MG tablet Commonly known as: PROTONIX       TAKE these medications    albuterol 108 (90 Base) MCG/ACT inhaler Commonly known as: VENTOLIN HFA Inhale 2 puffs into the lungs every 6 (six) hours as needed for wheezing or shortness of breath.   furosemide 20 MG tablet Commonly known as: LASIX Take 1 tablet (20 mg total) by mouth daily as needed for edema or fluid (for leg swelling).   losartan 50 MG tablet Commonly known as: COZAAR Take 1 tablet (50 mg total) by mouth daily.   metFORMIN 500 MG tablet Commonly known as: GLUCOPHAGE Take 1 tablet (500 mg total) by mouth 2 (two) times daily with a meal.   spironolactone 25 MG tablet Commonly known as: ALDACTONE Take 0.5 tablets (12.5 mg total) by mouth daily. Start taking on: September 28, 2023        Discharge Exam: Ceasar Mons Weights   09/25/23 0500 09/26/23 0400 09/27/23 0540  Weight: 114.4 kg 112.2 kg 111.4 kg   BP 112/86 (BP Location: Right Arm)   Pulse 97   Temp 97.7 F (36.5 C) (Oral)   Resp 20   Ht 5\' 10"  (1.778 m)   Wt 111.4 kg   SpO2 100%   BMI 35.25 kg/m   Patient is  feeling better, no chest pain or dyspnea, no PND, lower extremity edema or orthopnea.   Neurology awake and alert ENT with mild pallor Cardiovascular with S1 and S2 present and regular with no gallops, rubs or murmurs No JVD No lower extremity edema Respiratory with no rales or wheezing, no rhonchi  Abdomen with no distention    Condition at discharge: stable  The results of significant diagnostics from this hospitalization (including imaging, microbiology, ancillary and laboratory) are listed below for reference.   Imaging Studies: DG Chest Portable 1 View Result Date: 09/23/2023 CLINICAL DATA:  Shortness of breath. EXAM: PORTABLE CHEST 1 VIEW COMPARISON:  09/20/2023 FINDINGS: The cardio pericardial silhouette is enlarged. There is pulmonary vascular congestion without overt pulmonary edema. Low volume film without focal consolidation or pleural effusion. No acute bony abnormality. Telemetry leads overlie the chest. IMPRESSION: Enlargement of the cardiopericardial silhouette with pulmonary vascular congestion. Electronically Signed   By: Kennith Center M.D.   On: 09/23/2023 05:02   CT Angio Chest PE W and/or Wo Contrast Result Date: 09/21/2023 CLINICAL DATA:  Chest pain,  syncope/presyncope, PE suspected EXAM: CT ANGIOGRAPHY CHEST WITH CONTRAST TECHNIQUE: Multidetector CT imaging of the chest was performed using the standard protocol during bolus administration of intravenous contrast. Multiplanar CT image reconstructions and MIPs were obtained to evaluate the vascular anatomy. RADIATION DOSE REDUCTION: This exam was performed according to the departmental dose-optimization program which includes automated exposure control, adjustment of the mA and/or kV according to patient size and/or use of iterative reconstruction technique. CONTRAST:  75 mL Omnipaque 350 COMPARISON:  Chest radiograph 09/20/2023; CTA chest 08/22/2023 FINDINGS: Cardiovascular: Respiratory motion obscures the segmental and  subsegmental pulmonary arteries. No central pulmonary embolism. No pericardial effusion. Normal caliber thoracic aorta. Cardiomegaly. Mediastinum/Nodes: Bowing of the posterior trachea compatible with expiratory phase. Esophagus is unremarkable. Similar shotty mediastinal and hilar lymph nodes. Lungs/Pleura: Respiratory motion obscures fine detail. Diffuse bronchial wall and interlobular septal thickening. Patchy ground-glass opacities greater Sten the lower lungs. Atelectasis along the right minor fissure. No pleural effusion or pneumothorax. Upper Abdomen: Bilateral adrenal nodules are partially visualized and were previously characterized as benign on 02/17/2023 CT abdomen pelvis. No follow-up recommended. Partially visualized left renal cysts. No follow-up recommended. Musculoskeletal: No acute fracture. Unchanged 2 cm cystic lesion in the medial left upper chest likely a sebaceous cyst. Review of the MIP images confirms the above findings. IMPRESSION: 1. Respiratory motion obscures the segmental and subsegmental pulmonary arteries. No central pulmonary embolism. 2. Cardiomegaly with pulmonary edema. Electronically Signed   By: Minerva Fester M.D.   On: 09/21/2023 04:00   DG Chest 2 View Result Date: 09/20/2023 CLINICAL DATA:  Chest pain. EXAM: CHEST - 2 VIEW COMPARISON:  Chest recommended 08/25/2023. FINDINGS: Cardiomegaly with vascular congestion. Linear density along the minor fusion may represent atelectasis or small pleural effusion. Infiltrate is not excluded. No pneumothorax. No acute osseous pathology. IMPRESSION: 1. Cardiomegaly with vascular congestion. 2. Atelectasis versus small pleural effusion along the minor fissure. Electronically Signed   By: Elgie Collard M.D.   On: 09/20/2023 21:51    Microbiology: Results for orders placed or performed during the hospital encounter of 08/21/23  Resp panel by RT-PCR (RSV, Flu A&B, Covid) Anterior Nasal Swab     Status: None   Collection Time:  08/22/23  1:05 AM   Specimen: Anterior Nasal Swab  Result Value Ref Range Status   SARS Coronavirus 2 by RT PCR NEGATIVE NEGATIVE Final   Influenza A by PCR NEGATIVE NEGATIVE Final   Influenza B by PCR NEGATIVE NEGATIVE Final    Comment: (NOTE) The Xpert Xpress SARS-CoV-2/FLU/RSV plus assay is intended as an aid in the diagnosis of influenza from Nasopharyngeal swab specimens and should not be used as a sole basis for treatment. Nasal washings and aspirates are unacceptable for Xpert Xpress SARS-CoV-2/FLU/RSV testing.  Fact Sheet for Patients: BloggerCourse.com  Fact Sheet for Healthcare Providers: SeriousBroker.it  This test is not yet approved or cleared by the [place] FDA and has been authorized for detection and/or diagnosis of SARS-CoV-2 by FDA under an Emergency Use Authorization (EUA). This EUA will remain in effect (meaning this test can be used) for the duration of the COVID-19 declaration under Section 564(b)(1) of the Act, 21 U.S.C. section 360bbb-3(b)(1), unless the authorization is terminated or revoked.     Resp Syncytial Virus by PCR NEGATIVE NEGATIVE Final    Comment: (NOTE) Fact Sheet for Patients: BloggerCourse.com  Fact Sheet for Healthcare Providers: SeriousBroker.it  This test is not yet approved or cleared by the [place] FDA and has been authorized for detection  and/or diagnosis of SARS-CoV-2 by FDA under an Emergency Use Authorization (EUA). This EUA will remain in effect (meaning this test can be used) for the duration of the COVID-19 declaration under Section 564(b)(1) of the Act, 21 U.S.C. section 360bbb-3(b)(1), unless the authorization is terminated or revoked.  Performed at [facility], [address] [place], [place] [address]     Labs: CBC: Recent Labs  Lab 09/20/23 2134 09/23/23 0437 09/23/23 0503 09/24/23 0244   WBC 7.2 9.0  --  4.7  NEUTROABS  --  6.8  --   --   HGB 12.6 13.0 13.6 13.1  HCT 40.4 41.2 40.0 41.3  MCV 85.8 85.7  --  85.2  PLT 242 209  --  197   Basic Metabolic Panel: Recent Labs  Lab 09/23/23 0437 09/23/23 0503 09/23/23 0704 09/24/23 0244 09/25/23 1005 09/26/23 0924 09/27/23 0239  NA 142 144  --  138 139 140 138  K 3.6 3.5  --  3.7 4.0 4.1 4.2  CL 109  --   --  106 106 107 102  CO2 23  --   --  22 26 29 27   GLUCOSE 185*  --   --  279* 311* 217* 218*  BUN 10  --   --  14 13 12 13   CREATININE 0.67  --   --  1.02* 0.80 0.68 0.70  CALCIUM 10.0  --   --  9.7 9.6 10.0 10.1  MG  --   --  1.6* 1.7 1.7 1.6* 1.8   Liver Function Tests: No results for input(s): "AST", "ALT", "ALKPHOS", "BILITOT", "PROT", "ALBUMIN" in the last 168 hours. CBG: Recent Labs  Lab 09/26/23 0608 09/26/23 1129 09/26/23 1551 09/26/23 2057 09/27/23 0552  GLUCAP 223* 260* 200* 241* 251*    Discharge time spent: greater than 30 minutes.  Signed: Coralie Keens, MD [facility] 09/27/2023

## 2023-09-27 NOTE — TOC Transition Note (Signed)
 [facility] of [facility]) - Discharge Note   Patient Details  Name: TOMIKA ECKLES MRN: [id number] Date of Birth: 1975-10-27  [facility] CM/SW Contact:  Leone Haven, RN Phone Number: 09/27/2023, 12:19 PM   Clinical Narrative:    For dc today, TOC pharmacy to fill meds. She will be going to the shelter will need transport ast.    Final next level of care: Homeless Shelter Barriers to Discharge: No Barriers Identified   Patient Goals and CMS Choice Patient states their goals for this hospitalization and ongoing recovery are:: return to shelter          Discharge Placement                       Discharge Plan and Services Additional resources added to the After Visit Summary for   In-house Referral: NA Discharge Planning Services: CM Consult Post Acute Care Choice: NA          DME Arranged: N/A DME Agency: NA       HH Arranged: NA          Social Drivers of Health (SDOH) Interventions SDOH Screenings   Food Insecurity: Food Insecurity Present (09/24/2023)  Housing: High Risk (09/24/2023)  Transportation Needs: Unmet Transportation Needs (09/24/2023)  Utilities: At Risk (09/24/2023)  Alcohol Screen: Low Risk  (03/18/2023)  Depression (PHQ2-9): Medium Risk (03/05/2019)  Tobacco Use: High Risk (09/20/2023)     Readmission Risk Interventions    09/27/2023   12:14 PM  Readmission Risk Prevention Plan  Transportation Screening Complete  PCP or Specialist Appt within 3-5 Days Complete  HRI or Home Care Consult Complete  Social Work Consult for Recovery Care Planning/Counseling Complete  Palliative Care Screening Not Applicable  Medication Review Oceanographer) Complete

## 2023-09-27 NOTE — TOC Initial Note (Signed)
 Transition of Care [facility]) - Initial/Assessment Note    Patient Details  Name: Julie [name] MRN: [id number] Date of Birth: [birth year]  Transition [facility]) CM/SW Contact:    Leone Haven, RN Phone Number: 09/27/2023, 12:16 PM  Clinical Narrative:                 Homeless, has PCP on AVS follow up and insurance on file, states has no HH services in place at this time or DME at home.  States she will need ast for transportation, and she has somewhat family support,  states gets medications from [facility].  Pta self ambulatory.  Expected Discharge Plan: Homeless Shelter Barriers to Discharge: No Barriers Identified   Patient Goals and CMS Choice Patient states their goals for this hospitalization and ongoing recovery are:: return to shelter          Expected Discharge Plan and Services In-house Referral: NA Discharge Planning Services: CM Consult Post Acute Care Choice: NA Living arrangements for the past 2 months: Homeless Expected Discharge Date: 09/27/23               DME Arranged: N/A DME Agency: NA       HH Arranged: NA          Prior Living Arrangements/Services Living arrangements for the past 2 months: Homeless Lives with:: Self (homeless) Patient language and need for interpreter reviewed:: Yes Do you feel safe going back to the place where you live?: Yes      Need for Family Participation in Patient Care: No (Comment) Care giver support system in place?: No (comment)   Criminal Activity/Legal Involvement Pertinent to Current Situation/Hospitalization: No - Comment as needed  Activities of Daily Living      Permission Sought/Granted Permission sought to share information with : Case Manager Permission granted to share information with : Yes, Verbal Permission Granted              Emotional Assessment Appearance:: Appears stated age Attitude/Demeanor/Rapport: Engaged Affect (typically observed): Appropriate Orientation: : Oriented  to Self, Oriented to Place, Oriented to  Time, Oriented to Situation   Psych Involvement: No (comment)  Admission diagnosis:  Acute exacerbation of CHF (congestive heart failure) (HCC) [I50.9] Heart failure (HCC) [I50.9] Patient Active Problem List   Diagnosis Date Noted   Heart failure (HCC) 09/25/2023   Uncontrolled type 2 diabetes mellitus with hyperglycemia, without long-term current use of insulin (HCC) 09/23/2023   Acute on chronic diastolic CHF (congestive heart failure) (HCC) 09/23/2023   Asthma, chronic 09/23/2023   Costochondritis 09/23/2023   Hypomagnesemia 09/23/2023   Acute pulmonary edema with congestive heart failure (HCC) 09/21/2023   Asthma, chronic, unspecified asthma severity, with acute exacerbation 08/22/2023   Lower extremity edema 08/22/2023   Elevated brain natriuretic peptide (BNP) level 08/22/2023   Concern for new onset of CHF with exacerbation (congestive heart failure) (HCC) 08/22/2023   Hypokalemia 08/22/2023   Essential hypertension 08/22/2023   Elevated troponin 08/22/2023   Asthma exacerbation 08/22/2023   Continuous dependence on cigarette smoking 08/22/2023   Shortness of breath 08/22/2023   Cocaine use disorder, severe, dependence (HCC) 12/12/2016   PCP:  Oneita Hurt, No Pharmacy:   [id number] - [place] ([place]), [place] - [address]  ([place]) [place] [address] Phone: [phone] Fax: [phone]  [facility] [address] [place] [address] Phone: [phone] Fax: [phone]     Social Drivers of Health (SDOH) Social  History: SDOH Screenings   Food Insecurity: Food Insecurity Present (09/24/2023)  Housing: High Risk (09/24/2023)  Transportation Needs: Unmet Transportation Needs (09/24/2023)  Utilities: At Risk (09/24/2023)  Alcohol Screen: Low Risk  (03/18/2023)  Depression (PHQ2-9): Medium Risk (03/05/2019)  Tobacco Use: High Risk (09/20/2023)   SDOH  Interventions: Food Insecurity Interventions: Inpatient TOC, Community Resources Provided Housing Interventions: Inpatient TOC, Community Resources Provided Transportation Interventions: Inpatient TOC, [employer] Resources Provided Utilities Interventions: Inpatient TOC, Community Resources Provided   Readmission Risk Interventions    09/27/2023   12:14 PM  Readmission Risk Prevention Plan  Transportation Screening Complete  PCP or Specialist Appt within 3-5 Days Complete  HRI or Home Care Consult Complete  Social Work Consult for Recovery Care Planning/Counseling Complete  Palliative Care Screening Not Applicable  Medication Review Oceanographer) Complete

## 2023-09-27 NOTE — Plan of Care (Signed)
  Problem: Tissue Perfusion: Goal: Adequacy of tissue perfusion will improve Outcome: Progressing   Problem: Clinical Measurements: Goal: Will remain free from infection Outcome: Progressing Goal: Respiratory complications will improve Outcome: Progressing Goal: Cardiovascular complication will be avoided Outcome: Progressing   Problem: Coping: Goal: Ability to adjust to condition or change in health will improve Outcome: Not Progressing   Problem: Health Behavior/Discharge Planning: Goal: Ability to manage health-related needs will improve Outcome: Not Progressing

## 2023-10-10 ENCOUNTER — Emergency Department (HOSPITAL_COMMUNITY)
Admission: EM | Admit: 2023-10-10 | Discharge: 2023-10-11 | Disposition: A | Discharge status: Home / Self Care | Payer: MEDICAID | Attending: Emergency Medicine | Admitting: Emergency Medicine

## 2023-10-10 ENCOUNTER — Emergency Department (HOSPITAL_COMMUNITY): Payer: MEDICAID

## 2023-10-10 ENCOUNTER — Encounter (HOSPITAL_COMMUNITY): Payer: Self-pay | Admitting: Emergency Medicine

## 2023-10-10 DIAGNOSIS — R002 Palpitations: Secondary | ICD-10-CM | POA: Insufficient documentation

## 2023-10-10 DIAGNOSIS — R7989 Other specified abnormal findings of blood chemistry: Secondary | ICD-10-CM | POA: Diagnosis not present

## 2023-10-10 DIAGNOSIS — F17200 Nicotine dependence, unspecified, uncomplicated: Secondary | ICD-10-CM | POA: Diagnosis not present

## 2023-10-10 DIAGNOSIS — I110 Hypertensive heart disease with heart failure: Secondary | ICD-10-CM | POA: Diagnosis not present

## 2023-10-10 DIAGNOSIS — I509 Heart failure, unspecified: Secondary | ICD-10-CM | POA: Insufficient documentation

## 2023-10-10 DIAGNOSIS — Z79899 Other long term (current) drug therapy: Secondary | ICD-10-CM | POA: Insufficient documentation

## 2023-10-10 LAB — COMPREHENSIVE METABOLIC PANEL WITH GFR
ALT: 12 U/L (ref 0–44)
AST: 16 U/L (ref 15–41)
Albumin: 3.6 g/dL (ref 3.5–5.0)
Alkaline Phosphatase: 46 U/L (ref 38–126)
Anion gap: 10 (ref 5–15)
BUN: 11 mg/dL (ref 6–20)
CO2: 21 mmol/L — ABNORMAL LOW (ref 22–32)
Calcium: 10.3 mg/dL (ref 8.9–10.3)
Chloride: 108 mmol/L (ref 98–111)
Creatinine, Ser: 0.87 mg/dL (ref 0.44–1.00)
GFR, Estimated: >60 mL/min (ref >60)
Glucose, Bld: 223 mg/dL — ABNORMAL HIGH (ref 70–99)
Potassium: 3.5 mmol/L (ref 3.5–5.1)
Sodium: 139 mmol/L (ref 135–145)
Total Bilirubin: 0.4 mg/dL (ref 0.0–1.2)
Total Protein: 7 g/dL (ref 6.5–8.1)

## 2023-10-10 LAB — CBC WITH DIFFERENTIAL/PLATELET
Abs Immature Granulocytes: 0 10*3/uL (ref 0.00–0.07)
Basophils Absolute: 0 10*3/uL (ref 0.0–0.1)
Basophils Relative: 0 %
Eosinophils Absolute: 0.1 10*3/uL (ref 0.0–0.5)
Eosinophils Relative: 1 %
HCT: 42.4 % (ref 36.0–46.0)
Hemoglobin: 13.3 g/dL (ref 12.0–15.0)
Lymphocytes Relative: 48 %
Lymphs Abs: 4.2 10*3/uL — ABNORMAL HIGH (ref 0.7–4.0)
MCH: 26.8 pg (ref 26.0–34.0)
MCHC: 31.4 g/dL (ref 30.0–36.0)
MCV: 85.5 fL (ref 80.0–100.0)
Monocytes Absolute: 0.4 10*3/uL (ref 0.1–1.0)
Monocytes Relative: 5 %
Neutro Abs: 4 10*3/uL (ref 1.7–7.7)
Neutrophils Relative %: 46 %
Platelets: 262 10*3/uL (ref 150–400)
RBC: 4.96 MIL/uL (ref 3.87–5.11)
RDW: 14.8 % (ref 11.5–15.5)
WBC: 8.8 10*3/uL (ref 4.0–10.5)
nRBC: 0 % (ref 0.0–0.2)
nRBC: 0 /100{WBCs}

## 2023-10-10 LAB — RAPID URINE DRUG SCREEN, HOSP PERFORMED
Amphetamines: NOT DETECTED
Barbiturates: NOT DETECTED
Benzodiazepines: NOT DETECTED
Cocaine: POSITIVE — AB
Opiates: POSITIVE — AB
Tetrahydrocannabinol: NOT DETECTED

## 2023-10-10 LAB — BRAIN NATRIURETIC PEPTIDE: B Natriuretic Peptide: 339.6 pg/mL — ABNORMAL HIGH (ref 0.0–100.0)

## 2023-10-10 LAB — TROPONIN I (HIGH SENSITIVITY): Troponin I (High Sensitivity): 26 ng/L — ABNORMAL HIGH (ref <18)

## 2023-10-10 LAB — TSH: TSH: 3.682 u[IU]/mL (ref 0.350–4.500)

## 2023-10-10 LAB — RESP PANEL BY RT-PCR (RSV, FLU A&B, COVID)  RVPGX2
Influenza A by PCR: NEGATIVE
Influenza B by PCR: NEGATIVE
Resp Syncytial Virus by PCR: NEGATIVE
SARS Coronavirus 2 by RT PCR: NEGATIVE

## 2023-10-10 MED ORDER — IPRATROPIUM-ALBUTEROL 0.5-2.5 (3) MG/3ML IN SOLN
3.0000 mL | Freq: Once | RESPIRATORY_TRACT | Status: AC
  Administered 2023-10-10: 3 mL via RESPIRATORY_TRACT
  Filled 2023-10-10: qty 3

## 2023-10-10 MED ORDER — IOHEXOL 350 MG/ML SOLN
75.0000 mL | Freq: Once | INTRAVENOUS | Status: AC | PRN
Start: 2023-10-10 — End: 2023-10-10
  Administered 2023-10-10: 75 mL via INTRAVENOUS

## 2023-10-10 MED ORDER — MORPHINE SULFATE (PF) 4 MG/ML IV SOLN
4.0000 mg | Freq: Once | INTRAVENOUS | Status: AC
  Administered 2023-10-10: 4 mg via INTRAVENOUS
  Filled 2023-10-10: qty 1

## 2023-10-10 NOTE — ED Notes (Signed)
 Pt unable to void at this time. Pt aware sample needed, specimen cup at bedside.

## 2023-10-10 NOTE — ED Provider Triage Note (Signed)
 Emergency Medicine Provider Triage Evaluation Note  Julie [name] , a 48 y.o. female  was evaluated in triage.  Pt complains of chest pain and shortness of breath has been ongoing for the past 5 days.  Reports like her chest feels like it is going to "cave in".  Also reports a pins and needle sensation in her calves bilaterally that started earlier today.  Reports some swelling in her lower legs.  Review of Systems  Positive: As above Negative: As above  Physical Exam  BP (!) 194/101 (BP Location: Right Arm)   Pulse (!) 107   Temp 98.8 F (37.1 C)   Resp (!) 21   Ht 5\' 10"  (1.778 m)   Wt 116.1 kg   SpO2 100%   BMI 36.73 kg/m  Gen:   Awake, no distress   Resp:  Normal effort  MSK:   Moves extremities without difficulty.  Calves tender to palpation bilaterally.  No obvious lower extremity edema. Other:  2+ dorsalis pedis pulse bilaterally  Medical Decision Making  Medically screening exam initiated at 8:46 PM.  Appropriate orders placed.  PHYLIS JAVED was informed that the remainder of the evaluation will be completed by another provider, this initial triage assessment does not replace that evaluation, and the importance of remaining in the ED until their evaluation is complete.     Rexie Catena, PA-C 10/10/23 2049

## 2023-10-10 NOTE — ED Triage Notes (Signed)
 PT complains of chest pain, SOB and heart rate fluttering and lower leg swelling x 2 hours. Pt states legs and feet feel like they are going to bust

## 2023-10-10 NOTE — ED Provider Notes (Incomplete)
 [place] [facility] AT [facility] Provider Note   CSN: [id number] Arrival date & time: 10/10/23  2026     History {Add pertinent medical, surgical, social history, OB history to HPI:1} Chief Complaint  Patient presents with  . Palpitations    Julie [name] is a 48 y.o. female with past medical history significant for obesity, cocaine use disorder, asthma, heart failure, hypertension who presents with concern for chest pain, shortness of breath, heart fluttering, lower leg swelling for the last 2 hours.  Patient did take one of her home Lasix  which she reports helped a little bit with her leg swelling.  She reports that the swelling was bilateral, she denies any unilateral swelling.  She denies any history of blood clots.  She does endorse some cocaine use last time at 1 PM today.  She reports left-sided chest pain earlier today that is now moved to the right.  She reports it feels like tightness in her chest, denies pressure.  Reportedly no previous history of heart attack.  She does endorse tobacco use, around 1/2 pack/day.  She rates pain 10/10 at this time.   Palpitations      Home Medications Prior to Admission medications   Medication Sig Start Date End Date Taking? Authorizing Provider  albuterol  (VENTOLIN  HFA) 108 (90 Base) MCG/ACT inhaler Inhale 2 puffs into the lungs every 6 (six) hours as needed for wheezing or shortness of breath. 08/25/23   Dezii, Alexandra, DO  furosemide  (LASIX ) 20 MG tablet Take 1 tablet (20 mg total) by mouth daily as needed for edema or fluid (for leg swelling). 09/27/23   Arrien, Curlee Doss, MD  losartan  (COZAAR ) 50 MG tablet Take 1 tablet (50 mg total) by mouth daily. 09/27/23 10/27/23  Arrien, Curlee Doss, MD  metFORMIN  (GLUCOPHAGE ) 500 MG tablet Take 1 tablet (500 mg total) by mouth 2 (two) times daily with a meal. 09/27/23 10/27/23  Arrien, Curlee Doss, MD  spironolactone  (ALDACTONE ) 25 MG tablet Take 0.5 tablets (12.5 mg  total) by mouth daily. 09/28/23   Arrien, Curlee Doss, MD      Allergies    Patient has no known allergies.    Review of Systems   Review of Systems  Cardiovascular:  Positive for palpitations.  All other systems reviewed and are negative.   Physical Exam Updated Vital Signs BP (!) 193/114   Pulse (!) 108   Temp 98.8 F (37.1 C)   Resp (!) 26   Ht 5\' 10"  (1.778 m)   Wt 116.1 kg   SpO2 99%   BMI 36.73 kg/m  Physical Exam Vitals and nursing note reviewed.  Constitutional:      General: She is not in acute distress.    Appearance: Normal appearance.  HENT:     Head: Normocephalic and atraumatic.  Eyes:     General:        Right eye: No discharge.        Left eye: No discharge.  Cardiovascular:     Rate and Rhythm: Regular rhythm. Tachycardia present.     Heart sounds: No murmur heard.    No friction rub. No gallop.  Pulmonary:     Effort: Pulmonary effort is normal.     Breath sounds: Normal breath sounds.     Comments: Coarse rhonchi throughout lung fields, with some wheezing, she has a productive cough.  Increased respiratory effort, mild tachypnea.  No stridor, no rales, no tripoding, no accessory muscle use. Abdominal:  General: Bowel sounds are normal.     Palpations: Abdomen is soft.  Skin:    General: Skin is warm and dry.     Capillary Refill: Capillary refill takes less than 2 seconds.  Neurological:     Mental Status: She is alert and oriented to person, place, and time.  Psychiatric:        Mood and Affect: Mood normal.        Behavior: Behavior normal.     ED Results / Procedures / Treatments   Labs (all labs ordered are listed, but only abnormal results are displayed) Labs Reviewed  CBC WITH DIFFERENTIAL/PLATELET - Abnormal; Notable for the following components:      Result Value   Lymphs Abs 4.2 (*)    All other components within normal limits  COMPREHENSIVE METABOLIC PANEL WITH GFR - Abnormal; Notable for the following components:    CO2 21 (*)    Glucose, Bld 223 (*)    All other components within normal limits  BRAIN NATRIURETIC PEPTIDE - Abnormal; Notable for the following components:   B Natriuretic Peptide 339.6 (*)    All other components within normal limits  RESP PANEL BY RT-PCR (RSV, FLU A&B, COVID)  RVPGX2  TSH  RAPID URINE DRUG SCREEN, HOSP PERFORMED  TROPONIN I (HIGH SENSITIVITY)    EKG None  Radiology No results found.  Procedures Procedures  {Document cardiac monitor, telemetry assessment procedure when appropriate:1}  Medications Ordered in ED Medications  ipratropium-albuterol  (DUONEB) 0.5-2.5 (3) MG/3ML nebulizer solution 3 mL (3 mLs Nebulization Given 10/10/23 2222)  morphine  (PF) 4 MG/ML injection 4 mg (4 mg Intravenous Given 10/10/23 2221)    ED Course/ Medical Decision Making/ A&P   {   Click here for ABCD2, HEART and other calculatorsREFRESH Note before signing :1}                              Medical Decision Making Amount and/or Complexity of Data Reviewed Labs: ordered. Radiology: ordered.  Risk Prescription drug management.   This patient is a 48 y.o. female  who presents to the ED for concern of chest pain, shortness of breath, leg swelling.   Differential diagnoses prior to evaluation: The emergent differential diagnosis includes, but is not limited to,  ACS, AAS, PE, Mallory-Weiss, Boerhaave's, Pneumonia, acute bronchitis, asthma or COPD exacerbation, anxiety, MSK pain or traumatic injury to the chest, acid reflux versus other . This is not an exhaustive differential.   Past Medical History / Co-morbidities / Social History: obesity, cocaine use disorder, asthma, heart failure, hypertension  Additional history: Chart reviewed. Pertinent results include: ***  Physical Exam: Physical exam performed. The pertinent findings include: ***  Lab Tests/Imaging studies: I personally interpreted labs/imaging and the pertinent results include:  ***. ***I agree with the  radiologist interpretation.  Cardiac monitoring: EKG obtained and interpreted by myself and attending physician which shows: ***   Medications: I ordered medication including ***.  I have reviewed the patients home medicines and have made adjustments as needed.   Disposition: After consideration of the diagnostic results and the patients response to treatment, I feel that *** .   ***emergency department workup does not suggest an emergent condition requiring admission or immediate intervention beyond what has been performed at this time. The plan is: ***. The patient is safe for discharge and has been instructed to return immediately for worsening symptoms, change in symptoms or any other concerns.  Final Clinical Impression(s) / ED Diagnoses Final diagnoses:  None    Rx / DC Orders ED Discharge Orders     None

## 2023-10-10 NOTE — ED Provider Notes (Signed)
 [place] [facility] AT [facility] Provider Note   CSN: [id number] Arrival date & time: 10/10/23  2026     History  Chief Complaint  Patient presents with   Palpitations    Julie [name] is a 48 y.o. female with past medical history significant for obesity, cocaine use disorder, asthma, heart failure, hypertension who presents with concern for chest pain, shortness of breath, heart fluttering, lower leg swelling for the last 2 hours.  Patient did take one of her home Lasix  which she reports helped a little bit with her leg swelling.  She reports that the swelling was bilateral, she denies any unilateral swelling.  She denies any history of blood clots.  She does endorse some cocaine use last time at 1 PM today.  She reports left-sided chest pain earlier today that is now moved to the right.  She reports it feels like tightness in her chest, denies pressure.  Reportedly no previous history of heart attack.  She does endorse tobacco use, around 1/2 pack/day.  She rates pain 10/10 at this time.   Palpitations      Home Medications Prior to Admission medications   Medication Sig Start Date End Date Taking? Authorizing Provider  albuterol  (VENTOLIN  HFA) 108 (90 Base) MCG/ACT inhaler Inhale 2 puffs into the lungs every 6 (six) hours as needed for wheezing or shortness of breath. 08/25/23   Dezii, Alexandra, DO  furosemide  (LASIX ) 20 MG tablet Take 1 tablet (20 mg total) by mouth daily as needed for edema or fluid (for leg swelling). 09/27/23   Arrien, Curlee Doss, MD  losartan  (COZAAR ) 50 MG tablet Take 1 tablet (50 mg total) by mouth daily. 09/27/23 10/27/23  Arrien, Curlee Doss, MD  metFORMIN  (GLUCOPHAGE ) 500 MG tablet Take 1 tablet (500 mg total) by mouth 2 (two) times daily with a meal. 09/27/23 10/27/23  Arrien, Curlee Doss, MD  spironolactone  (ALDACTONE ) 25 MG tablet Take 0.5 tablets (12.5 mg total) by mouth daily. 09/28/23   Arrien, Curlee Doss, MD       Allergies    Patient has no known allergies.    Review of Systems   Review of Systems  Cardiovascular:  Positive for palpitations.  All other systems reviewed and are negative.   Physical Exam Updated Vital Signs BP (!) 153/105   Pulse 82   Temp 98.5 F (36.9 C) (Oral)   Resp (!) 22   Ht 5\' 10"  (1.778 m)   Wt 116.1 kg   SpO2 100%   BMI 36.73 kg/m  Physical Exam Vitals and nursing note reviewed.  Constitutional:      General: She is not in acute distress.    Appearance: Normal appearance.  HENT:     Head: Normocephalic and atraumatic.  Eyes:     General:        Right eye: No discharge.        Left eye: No discharge.  Cardiovascular:     Rate and Rhythm: Regular rhythm. Tachycardia present.     Heart sounds: No murmur heard.    No friction rub. No gallop.  Pulmonary:     Effort: Pulmonary effort is normal.     Breath sounds: Normal breath sounds.     Comments: Coarse rhonchi throughout lung fields, with some wheezing, she has a productive cough.  Increased respiratory effort, mild tachypnea.  No stridor, no rales, no tripoding, no accessory muscle use. Abdominal:     General: Bowel sounds are normal.  Palpations: Abdomen is soft.  Skin:    General: Skin is warm and dry.     Capillary Refill: Capillary refill takes less than 2 seconds.  Neurological:     Mental Status: She is alert and oriented to person, place, and time.  Psychiatric:        Mood and Affect: Mood normal.        Behavior: Behavior normal.     ED Results / Procedures / Treatments   Labs (all labs ordered are listed, but only abnormal results are displayed) Labs Reviewed  CBC WITH DIFFERENTIAL/PLATELET - Abnormal; Notable for the following components:      Result Value   Lymphs Abs 4.2 (*)    All other components within normal limits  COMPREHENSIVE METABOLIC PANEL WITH GFR - Abnormal; Notable for the following components:   CO2 21 (*)    Glucose, Bld 223 (*)    All other components  within normal limits  BRAIN NATRIURETIC PEPTIDE - Abnormal; Notable for the following components:   B Natriuretic Peptide 339.6 (*)    All other components within normal limits  RAPID URINE DRUG SCREEN, HOSP PERFORMED - Abnormal; Notable for the following components:   Opiates POSITIVE (*)    Cocaine POSITIVE (*)    All other components within normal limits  TROPONIN I (HIGH SENSITIVITY) - Abnormal; Notable for the following components:   Troponin I (High Sensitivity) 26 (*)    All other components within normal limits  TROPONIN I (HIGH SENSITIVITY) - Abnormal; Notable for the following components:   Troponin I (High Sensitivity) 27 (*)    All other components within normal limits  RESP PANEL BY RT-PCR (RSV, FLU A&B, COVID)  RVPGX2  TSH    EKG EKG Interpretation Date/Time:  Monday October 10 2023 20:42:09 EDT Ventricular Rate:  103 PR Interval:  130 QRS Duration:  92 QT Interval:  362 QTC Calculation: 474 R Axis:   -22  Text Interpretation: Sinus tachycardia Possible Left atrial enlargement Incomplete right bundle branch block Minimal voltage criteria for LVH, may be normal variant ( R in aVL ) Nonspecific T wave abnormality Abnormal ECG No significant change since last tracing Confirmed by Celesta Coke ([id number]) on 10/10/2023 10:45:42 PM  Radiology CT Angio Chest PE W and/or Wo Contrast Result Date: 10/11/2023 CLINICAL DATA:  Suspected pulmonary embolism. EXAM: CT ANGIOGRAPHY CHEST WITH CONTRAST TECHNIQUE: Multidetector CT imaging of the chest was performed using the standard protocol during bolus administration of intravenous contrast. Multiplanar CT image reconstructions and MIPs were obtained to evaluate the vascular anatomy. RADIATION DOSE REDUCTION: This exam was performed according to the departmental dose-optimization program which includes automated exposure control, adjustment of the mA and/or kV according to patient size and/or use of iterative reconstruction technique.  CONTRAST:  75mL OMNIPAQUE  IOHEXOL  350 MG/ML SOLN COMPARISON:  September 21, 2023 FINDINGS: Cardiovascular: Satisfactory opacification of the pulmonary arteries to the segmental level. No evidence of pulmonary embolism. There is mild, stable cardiomegaly. No pericardial effusion. Mediastinum/Nodes: No enlarged mediastinal, hilar, or axillary lymph nodes. Thyroid  gland, trachea, and esophagus demonstrate no significant findings. Lungs/Pleura: There is mild anteromedial right middle lobe linear atelectasis. No acute infiltrate, pleural effusion or pneumothorax is identified. Upper Abdomen: A 1.5 cm diameter low-attenuation left adrenal mass is seen (approximately -20.64 Hounsfield units). A 2.1 cm low-attenuation right adrenal mass is also noted (approximately 12.50 Hounsfield units). Noninflamed diverticula are seen along the splenic flexure. Musculoskeletal: No chest wall abnormality. No acute or significant osseous findings. Review  of the MIP images confirms the above findings. IMPRESSION: 1. No evidence of acute pulmonary embolism. 2. Mild, stable cardiomegaly. 3. Findings likely consistent with bilateral adrenal adenomas. No follow-up imaging is recommended. This recommendation follows ACR consensus guidelines: Management of Incidental Adrenal Masses: A White Paper of the ACR Incidental Findings Committee. J Am Coll Radiol 2017;14:1038-1044. Electronically Signed   By: Virgle Grime M.D.   On: 10/11/2023 02:48   DG Chest 2 View Result Date: 10/10/2023 CLINICAL DATA:  Chest pain with "pins and needles sensation" in lower extremities. EXAM: CHEST - 2 VIEW COMPARISON:  September 23, 2023 FINDINGS: The cardiac silhouette is mildly enlarged and unchanged in size. Both lungs are clear. The visualized skeletal structures are unremarkable. IMPRESSION: No active cardiopulmonary disease. Electronically Signed   By: Virgle Grime M.D.   On: 10/10/2023 23:56    Procedures Procedures    Medications Ordered in  ED Medications  furosemide  (LASIX ) tablet 40 mg (has no administration in time range)  ipratropium-albuterol  (DUONEB) 0.5-2.5 (3) MG/3ML nebulizer solution 3 mL (3 mLs Nebulization Given 10/10/23 2222)  morphine  (PF) 4 MG/ML injection 4 mg (4 mg Intravenous Given 10/10/23 2221)  iohexol  (OMNIPAQUE ) 350 MG/ML injection 75 mL (75 mLs Intravenous Contrast Given 10/10/23 2356)    ED Course/ Medical Decision Making/ A&P                                 Medical Decision Making Amount and/or Complexity of Data Reviewed Labs: ordered. Radiology: ordered.  Risk Prescription drug management.   This patient is a 48 y.o. female  who presents to the ED for concern of chest pain, shortness of breath, leg swelling.   Differential diagnoses prior to evaluation: The emergent differential diagnosis includes, but is not limited to,  ACS, AAS, PE, Mallory-Weiss, Boerhaave's, Pneumonia, acute bronchitis, asthma or COPD exacerbation, anxiety, MSK pain or traumatic injury to the chest, acid reflux versus other . This is not an exhaustive differential.   Past Medical History / Co-morbidities / Social History: obesity, cocaine use disorder, asthma, heart failure, hypertension  Additional history: Chart reviewed. Pertinent results include: I reviewed lab work, imaging from Previous emergency department visits, history of cocaine abuse, no previous is ACS but she does have chronically elevated troponin likely from her cocaine use  Physical Exam: Physical exam performed. The pertinent findings include: Coarse rhonchi throughout lung fields, with some wheezing, she has a productive cough.  Increased respiratory effort, mild tachypnea.  No stridor, no rales, no tripoding, no accessory muscle use.  Initially tachycardic, tachycardia improved on reevaluation, hypertension, initially 194/101, improved to 171/96 on recheck  Lab Tests/Imaging studies: I personally interpreted labs/imaging and the pertinent results  include:  CMP notable for hyperglycemia, glucose 223, mild bicarb deficit, CO2 21, normal TSH, CBC unremarkable overall, UDS positive for opiates, cocaine, her troponin is elevated initially at 26, repeat at 27, it is chronically elevated likely secondary to her cocaine use, not significantly worse than her baseline.  Her BNP is somewhat elevated at 339.6.  I independently interpreted plain film chest x-ray shows no evidence of acute intrathoracic abnormality, CT angio PE study additionally with no evidence of acute intrathoracic abnormality.. I agree with the radiologist interpretation.  Cardiac monitoring: EKG obtained and interpreted by myself and attending physician which shows: Sinus tachycardia, no significant change from recent tracing other than increased rate   Medications: I ordered medication including DuoNeb for wheezing, rhonchi,  morphine  for pain.  I have reviewed the patients home medicines and have made adjustments as needed.   Disposition: After consideration of the diagnostic results and the patients response to treatment, I feel that patient with no evidence of acute cardiac abnormality other than her baseline heart failure related to cocaine use, troponin is chronically elevated but flat tonight, pain improved on reevaluation, vitals improved on reevaluation.Aaron Aas   emergency department workup does not suggest an emergent condition requiring admission or immediate intervention beyond what has been performed at this time. The plan is: as above. The patient is safe for discharge and has been instructed to return immediately for worsening symptoms, change in symptoms or any other concerns.  Final Clinical Impression(s) / ED Diagnoses Final diagnoses:  Palpitations  Acute on chronic congestive heart failure, unspecified heart failure type [facility])    Rx / DC Orders ED Discharge Orders     None         Nelly Banco, PA-C 10/11/23 0300    Kingsley, Victoria K,  DO 10/11/23 1556

## 2023-10-11 LAB — TROPONIN I (HIGH SENSITIVITY): Troponin I (High Sensitivity): 27 ng/L — ABNORMAL HIGH (ref <18)

## 2023-10-11 MED ORDER — FUROSEMIDE 20 MG PO TABS
40.0000 mg | ORAL_TABLET | Freq: Once | ORAL | Status: DC
  Filled 2023-10-11: qty 2

## 2023-10-11 NOTE — Discharge Instructions (Signed)
 We have given you an extra dose of your heart failure medication, please stop smoking cigarettes, stop using cocaine as these will likely help you to not have any additional heart or lung events so frequently.  Please return to the emergency department if you have worsening chest pain, shortness of breath, fever, chills.

## 2023-10-11 NOTE — ED Notes (Signed)
 Patient states she did not want to be hooked back up to the monitor

## 2023-11-01 ENCOUNTER — Ambulatory Visit: Payer: MEDICAID | Admitting: Internal Medicine

## 2023-11-10 ENCOUNTER — Other Ambulatory Visit: Payer: Self-pay

## 2023-11-10 ENCOUNTER — Emergency Department (HOSPITAL_COMMUNITY)
Admission: EM | Admit: 2023-11-10 | Discharge: 2023-11-11 | Disposition: A | Discharge status: Home / Self Care | Payer: MEDICAID | Attending: Emergency Medicine | Admitting: Emergency Medicine

## 2023-11-10 DIAGNOSIS — M79672 Pain in left foot: Secondary | ICD-10-CM | POA: Diagnosis not present

## 2023-11-10 DIAGNOSIS — B351 Tinea unguium: Secondary | ICD-10-CM

## 2023-11-10 DIAGNOSIS — L731 Pseudofolliculitis barbae: Secondary | ICD-10-CM | POA: Diagnosis present

## 2023-11-10 DIAGNOSIS — M79671 Pain in right foot: Secondary | ICD-10-CM | POA: Diagnosis not present

## 2023-11-10 NOTE — ED Triage Notes (Signed)
 Patient reports 2 boils at upper chest onset yesterday , no drainage /afebrile .

## 2023-11-11 ENCOUNTER — Emergency Department (HOSPITAL_COMMUNITY): Payer: MEDICAID

## 2023-11-11 MED ORDER — SULFAMETHOXAZOLE-TRIMETHOPRIM 800-160 MG PO TABS: 1.0000 | ORAL_TABLET | Freq: Two times a day (BID) | ORAL | 0 refills | Status: DC

## 2023-11-11 MED ORDER — SULFAMETHOXAZOLE-TRIMETHOPRIM 800-160 MG PO TABS: 1.0000 | ORAL_TABLET | Freq: Two times a day (BID) | ORAL | 0 refills | Status: AC

## 2023-11-11 MED ORDER — SULFAMETHOXAZOLE-TRIMETHOPRIM 800-160 MG PO TABS
1.0000 | ORAL_TABLET | Freq: Once | ORAL | Status: AC
Start: 2023-11-11 — End: 2023-11-11
  Administered 2023-11-11: 1 via ORAL
  Filled 2023-11-11: qty 1

## 2023-11-11 MED ORDER — ACETAMINOPHEN 500 MG PO TABS
1000.0000 mg | ORAL_TABLET | Freq: Once | ORAL | Status: AC
  Administered 2023-11-11: 1000 mg via ORAL
  Filled 2023-11-11: qty 2

## 2023-11-11 NOTE — ED Provider Notes (Signed)
 [place] EMERGENCY DEPARTMENT AT [facility] Provider Note   CSN: [id number] Arrival date & time: 11/10/23  2121     History  Chief Complaint  Patient presents with   Julie [name] is a 48 y.o. female.  The history is provided by the patient.  Illness Location:  Reports 2 boil to the chest wall, toenail and foot issues and wants chest evaluated Severity:  Moderate Onset quality:  Gradual Timing:  Constant Progression:  Unchanged Chronicity:  New Context:  Does not have a family doctor Relieved by:  Nothing Worsened by:  Nothing Ineffective treatments:  None Associated symptoms: no abdominal pain, no chest pain, no fever, no nausea, no vomiting and no wheezing        Home Medications Prior to Admission medications   Medication Sig Start Date End Date Taking? Authorizing Provider  albuterol  (VENTOLIN  HFA) 108 (90 Base) MCG/ACT inhaler Inhale 2 puffs into the lungs every 6 (six) hours as needed for wheezing or shortness of breath. 08/25/23   Dezii, Alexandra, DO  furosemide  (LASIX ) 20 MG tablet Take 1 tablet (20 mg total) by mouth daily as needed for edema or fluid (for leg swelling). 09/27/23   Arrien, Curlee Doss, MD  losartan  (COZAAR ) 50 MG tablet Take 1 tablet (50 mg total) by mouth daily. 09/27/23 10/27/23  Arrien, Curlee Doss, MD  metFORMIN  (GLUCOPHAGE ) 500 MG tablet Take 1 tablet (500 mg total) by mouth 2 (two) times daily with a meal. 09/27/23 10/27/23  Arrien, Curlee Doss, MD  spironolactone  (ALDACTONE ) 25 MG tablet Take 0.5 tablets (12.5 mg total) by mouth daily. 09/28/23   Arrien, Curlee Doss, MD  sulfamethoxazole -trimethoprim  (BACTRIM  DS) 800-160 MG tablet Take 1 tablet by mouth 2 (two) times daily for 7 days. 11/11/23 11/18/23  Aritzel Krusemark, MD      Allergies    Patient has no known allergies.    Review of Systems   Review of Systems  Constitutional:  Negative for fever.  HENT:  Negative for nosebleeds.   Eyes:  Negative for  redness.  Respiratory:  Negative for wheezing and stridor.   Cardiovascular:  Negative for chest pain.  Gastrointestinal:  Negative for abdominal pain, nausea and vomiting.  All other systems reviewed and are negative.   Physical Exam Updated Vital Signs BP (!) 160/99 (BP Location: Left Arm)   Pulse 99   Temp 98.7 F (37.1 C) (Oral)   Resp 17   SpO2 95%  Physical Exam Vitals and nursing note reviewed.  Constitutional:      General: She is not in acute distress.    Appearance: Normal appearance. She is well-developed.  HENT:     Head: Normocephalic and atraumatic.     Nose: Nose normal.  Eyes:     Pupils: Pupils are equal, round, and reactive to light.  Cardiovascular:     Rate and Rhythm: Normal rate and regular rhythm.     Pulses: Normal pulses.     Heart sounds: Normal heart sounds.  Pulmonary:     Effort: Pulmonary effort is normal. No respiratory distress.     Breath sounds: Normal breath sounds. No wheezing, rhonchi or rales.  Chest:    Abdominal:     General: Bowel sounds are normal. There is no distension.     Palpations: Abdomen is soft.     Tenderness: There is no abdominal tenderness. There is no guarding or rebound.  Musculoskeletal:        General: Normal  range of motion.     Cervical back: Normal range of motion and neck supple.     Right foot: Normal capillary refill. No swelling, deformity, tenderness, bony tenderness or crepitus. Normal pulse.     Left foot: Normal capillary refill. No swelling, deformity, tenderness, bony tenderness or crepitus. Normal pulse.     Comments: Toenail darkened and thickened and elongated   Skin:    General: Skin is warm and dry.     Capillary Refill: Capillary refill takes less than 2 seconds.     Findings: No erythema or rash.  Neurological:     General: No focal deficit present.     Mental Status: She is alert and oriented to person, place, and time.     Deep Tendon Reflexes: Reflexes normal.  Psychiatric:         Mood and Affect: Mood normal.     ED Results / Procedures / Treatments   Labs (all labs ordered are listed, but only abnormal results are displayed) Labs Reviewed - No data to display  EKG None  Radiology DG Chest 2 View Result Date: 11/11/2023 CLINICAL DATA:  Cough. EXAM: CHEST - 2 VIEW COMPARISON:  Leylah Tarnow 21, 2025 FINDINGS: The cardiac silhouette is mildly enlarged and unchanged in size. There is mild central prominence of the pulmonary vasculature. Low lung volumes are seen with mild, stable elevation of the right hemidiaphragm. There is no evidence of focal consolidation, pleural effusion or pneumothorax. The visualized skeletal structures are unremarkable. IMPRESSION: Low lung volumes with mild, stable cardiomegaly and mild central pulmonary vascular congestion. Electronically Signed   By: Virgle Grime M.D.   On: 11/11/2023 01:12    Procedures Procedures    Medications Ordered in ED Medications  sulfamethoxazole -trimethoprim  (BACTRIM  DS) 800-160 MG per tablet 1 tablet (1 tablet Oral Given 11/11/23 0035)  acetaminophen  (TYLENOL ) tablet 1,000 mg (1,000 mg Oral Given 11/11/23 0035)    ED Course/ Medical Decision Making/ A&P                                 Medical Decision Making Patient with multiple issues today that need to be addressed   Amount and/or Complexity of Data Reviewed External Data Reviewed: notes.    Details: Previous notes reviewed  Radiology: ordered and independent interpretation performed.    Details: No pulmonary edema or effusions   Risk OTC drugs. Prescription drug management. Risk Details: I have started bactrim  for ingrown hair,  Lipoma lesion will refer to derm.  Will refer to PMD for ongoing care and podiatry for foot issues.  Stable for discharge.      Final Clinical Impression(s) / ED Diagnoses   No signs of systemic illness or infection. The patient is nontoxic-appearing on exam and vital signs are within normal limits.  I have  reviewed the triage vital signs and the nursing notes. Pertinent labs & imaging results that were available during my care of the patient were reviewed by me and considered in my medical decision making (see chart for details). After history, exam, and medical workup I feel the patient has been appropriately medically screened and is safe for discharge home. Pertinent diagnoses were discussed with the patient. Patient was given return precautions.    Rx / DC Orders ED Discharge Orders          Ordered    sulfamethoxazole -trimethoprim  (BACTRIM  DS) 800-160 MG tablet  2 times daily,   Status:  Discontinued        11/11/23 0152    sulfamethoxazole -trimethoprim  (BACTRIM  DS) 800-160 MG tablet  2 times daily        11/11/23 0153              Marli Diego, MD 11/11/23 0202

## 2023-11-22 ENCOUNTER — Ambulatory Visit: Payer: MEDICAID | Admitting: Podiatry

## 2023-12-03 ENCOUNTER — Inpatient Hospital Stay (HOSPITAL_COMMUNITY)
Admission: EM | Admit: 2023-12-03 | Discharge: 2023-12-05 | DRG: 291 | Disposition: A | Discharge status: Home / Self Care | Payer: MEDICAID | Attending: Infectious Diseases | Admitting: Infectious Diseases

## 2023-12-03 ENCOUNTER — Other Ambulatory Visit: Payer: Self-pay

## 2023-12-03 ENCOUNTER — Emergency Department (HOSPITAL_COMMUNITY): Payer: MEDICAID

## 2023-12-03 ENCOUNTER — Encounter (HOSPITAL_COMMUNITY): Payer: Self-pay | Admitting: Internal Medicine

## 2023-12-03 DIAGNOSIS — Z79899 Other long term (current) drug therapy: Secondary | ICD-10-CM

## 2023-12-03 DIAGNOSIS — Z91138 Patient's unintentional underdosing of medication regimen for other reason: Secondary | ICD-10-CM

## 2023-12-03 DIAGNOSIS — J441 Chronic obstructive pulmonary disease with (acute) exacerbation: Secondary | ICD-10-CM | POA: Diagnosis present

## 2023-12-03 DIAGNOSIS — E1165 Type 2 diabetes mellitus with hyperglycemia: Secondary | ICD-10-CM | POA: Diagnosis present

## 2023-12-03 DIAGNOSIS — Z825 Family history of asthma and other chronic lower respiratory diseases: Secondary | ICD-10-CM

## 2023-12-03 DIAGNOSIS — Z800 Family history of malignant neoplasm of digestive organs: Secondary | ICD-10-CM

## 2023-12-03 DIAGNOSIS — Z833 Family history of diabetes mellitus: Secondary | ICD-10-CM

## 2023-12-03 DIAGNOSIS — Z556 Problems related to health literacy: Secondary | ICD-10-CM

## 2023-12-03 DIAGNOSIS — R0902 Hypoxemia: Principal | ICD-10-CM

## 2023-12-03 DIAGNOSIS — Z7984 Long term (current) use of oral hypoglycemic drugs: Secondary | ICD-10-CM

## 2023-12-03 DIAGNOSIS — I5033 Acute on chronic diastolic (congestive) heart failure: Secondary | ICD-10-CM | POA: Clinically undetermined

## 2023-12-03 DIAGNOSIS — J9601 Acute respiratory failure with hypoxia: Secondary | ICD-10-CM | POA: Diagnosis not present

## 2023-12-03 DIAGNOSIS — I110 Hypertensive heart disease with heart failure: Principal | ICD-10-CM | POA: Diagnosis present

## 2023-12-03 DIAGNOSIS — F17210 Nicotine dependence, cigarettes, uncomplicated: Secondary | ICD-10-CM | POA: Diagnosis present

## 2023-12-03 DIAGNOSIS — I509 Heart failure, unspecified: Secondary | ICD-10-CM

## 2023-12-03 DIAGNOSIS — Z8249 Family history of ischemic heart disease and other diseases of the circulatory system: Secondary | ICD-10-CM

## 2023-12-03 DIAGNOSIS — F1490 Cocaine use, unspecified, uncomplicated: Secondary | ICD-10-CM | POA: Diagnosis present

## 2023-12-03 DIAGNOSIS — F1290 Cannabis use, unspecified, uncomplicated: Secondary | ICD-10-CM | POA: Diagnosis present

## 2023-12-03 DIAGNOSIS — Z59819 Housing instability, housed unspecified: Secondary | ICD-10-CM

## 2023-12-03 DIAGNOSIS — T501X6A Underdosing of loop [high-ceiling] diuretics, initial encounter: Secondary | ICD-10-CM | POA: Diagnosis present

## 2023-12-03 LAB — RESPIRATORY PANEL BY PCR

## 2023-12-03 LAB — BLOOD GAS, VENOUS
Acid-Base Excess: 4.4 mmol/L — ABNORMAL HIGH (ref 0.0–2.0)
Acid-Base Excess: 5.5 mmol/L — ABNORMAL HIGH (ref 0.0–2.0)
Bicarbonate: 30.8 mmol/L — ABNORMAL HIGH (ref 20.0–28.0)
Bicarbonate: 31.6 mmol/L — ABNORMAL HIGH (ref 20.0–28.0)
O2 Saturation: 57.3 %
O2 Saturation: 56.7 %
Patient temperature: 37
Patient temperature: 37.2
pCO2, Ven: 52 mmHg (ref 44–60)
pCO2, Ven: 51 mmHg (ref 44–60)
pH, Ven: 7.38 (ref 7.25–7.43)
pH, Ven: 7.4 (ref 7.25–7.43)
pO2, Ven: 35 mmHg (ref 32–45)
pO2, Ven: 33 mmHg (ref 32–45)

## 2023-12-03 LAB — SARS CORONAVIRUS 2 BY RT PCR: SARS Coronavirus 2 by RT PCR: NEGATIVE

## 2023-12-03 LAB — COMPREHENSIVE METABOLIC PANEL WITH GFR
ALT: 25 U/L (ref 0–44)
AST: 27 U/L (ref 15–41)
Albumin: 3.4 g/dL — ABNORMAL LOW (ref 3.5–5.0)
Alkaline Phosphatase: 52 U/L (ref 38–126)
Anion gap: 7 (ref 5–15)
BUN: 8 mg/dL (ref 6–20)
CO2: 24 mmol/L (ref 22–32)
Calcium: 9.7 mg/dL (ref 8.9–10.3)
Chloride: 109 mmol/L (ref 98–111)
Creatinine, Ser: 0.78 mg/dL (ref 0.44–1.00)
GFR, Estimated: >60 mL/min (ref >60)
Glucose, Bld: 184 mg/dL — ABNORMAL HIGH (ref 70–99)
Potassium: 3.6 mmol/L (ref 3.5–5.1)
Sodium: 140 mmol/L (ref 135–145)
Total Bilirubin: 0.6 mg/dL (ref 0.0–1.2)
Total Protein: 6.4 g/dL — ABNORMAL LOW (ref 6.5–8.1)

## 2023-12-03 LAB — TROPONIN I (HIGH SENSITIVITY)
Troponin I (High Sensitivity): 30 ng/L — ABNORMAL HIGH (ref <18)
Troponin I (High Sensitivity): 28 ng/L — ABNORMAL HIGH (ref <18)

## 2023-12-03 LAB — PROCALCITONIN: Procalcitonin: <0.1 ng/mL

## 2023-12-03 LAB — I-STAT VENOUS BLOOD GAS, ED
Acid-Base Excess: 0 mmol/L (ref 0.0–2.0)
Bicarbonate: 25.5 mmol/L (ref 20.0–28.0)
Calcium, Ion: 1.21 mmol/L (ref 1.15–1.40)
HCT: 37 % (ref 36.0–46.0)
Hemoglobin: 12.6 g/dL (ref 12.0–15.0)
O2 Saturation: 92 %
Potassium: 4.8 mmol/L (ref 3.5–5.1)
Sodium: 143 mmol/L (ref 135–145)
TCO2: 27 mmol/L (ref 22–32)
pCO2, Ven: 42.9 mmHg — ABNORMAL LOW (ref 44–60)
pH, Ven: 7.382 (ref 7.25–7.43)
pO2, Ven: 65 mmHg — ABNORMAL HIGH (ref 32–45)

## 2023-12-03 LAB — BRAIN NATRIURETIC PEPTIDE: B Natriuretic Peptide: 343.2 pg/mL — ABNORMAL HIGH (ref 0.0–100.0)

## 2023-12-03 LAB — GLUCOSE, CAPILLARY
Glucose-Capillary: 428 mg/dL — ABNORMAL HIGH (ref 70–99)
Glucose-Capillary: 251 mg/dL — ABNORMAL HIGH (ref 70–99)

## 2023-12-03 LAB — CBC WITH DIFFERENTIAL/PLATELET
Abs Immature Granulocytes: 0.03 10*3/uL (ref 0.00–0.07)
Basophils Absolute: 0.1 10*3/uL (ref 0.0–0.1)
Basophils Relative: 1 %
Eosinophils Absolute: 0.1 10*3/uL (ref 0.0–0.5)
Eosinophils Relative: 1 %
HCT: 41.6 % (ref 36.0–46.0)
Hemoglobin: 12.8 g/dL (ref 12.0–15.0)
Immature Granulocytes: 0 %
Lymphocytes Relative: 29 %
Lymphs Abs: 2.7 10*3/uL (ref 0.7–4.0)
MCH: 27 pg (ref 26.0–34.0)
MCHC: 30.8 g/dL (ref 30.0–36.0)
MCV: 87.8 fL (ref 80.0–100.0)
Monocytes Absolute: 0.7 10*3/uL (ref 0.1–1.0)
Monocytes Relative: 7 %
Neutro Abs: 5.8 10*3/uL (ref 1.7–7.7)
Neutrophils Relative %: 62 %
Platelets: 207 10*3/uL (ref 150–400)
RBC: 4.74 MIL/uL (ref 3.87–5.11)
RDW: 14.1 % (ref 11.5–15.5)
WBC: 9.3 10*3/uL (ref 4.0–10.5)
nRBC: 0 % (ref 0.0–0.2)

## 2023-12-03 LAB — D-DIMER, QUANTITATIVE: D-Dimer, Quant: <0.27 ug{FEU}/mL (ref 0.00–0.50)

## 2023-12-03 LAB — CBG MONITORING, ED: Glucose-Capillary: 236 mg/dL — ABNORMAL HIGH (ref 70–99)

## 2023-12-03 MED ORDER — UMECLIDINIUM-VILANTEROL 62.5-25 MCG/ACT IN AEPB
1.0000 | INHALATION_SPRAY | Freq: Every day | RESPIRATORY_TRACT | Status: DC
  Administered 2023-12-04 – 2023-12-05 (×2): 1 via RESPIRATORY_TRACT
  Filled 2023-12-03: qty 14

## 2023-12-03 MED ORDER — INSULIN ASPART 100 UNIT/ML IJ SOLN
0.0000 [IU] | Freq: Three times a day (TID) | INTRAMUSCULAR | Status: DC
  Administered 2023-12-03: 15 [IU] via SUBCUTANEOUS
  Administered 2023-12-03: 5 [IU] via SUBCUTANEOUS
  Administered 2023-12-04: 11 [IU] via SUBCUTANEOUS
  Administered 2023-12-04 (×2): 8 [IU] via SUBCUTANEOUS
  Administered 2023-12-05: 5 [IU] via SUBCUTANEOUS

## 2023-12-03 MED ORDER — METHYLPREDNISOLONE SODIUM SUCC 125 MG IJ SOLR
125.0000 mg | Freq: Once | INTRAMUSCULAR | Status: AC
  Administered 2023-12-03: 125 mg via INTRAVENOUS
  Filled 2023-12-03: qty 2

## 2023-12-03 MED ORDER — ENOXAPARIN SODIUM 40 MG/0.4ML IJ SOSY
40.0000 mg | PREFILLED_SYRINGE | INTRAMUSCULAR | Status: DC
  Administered 2023-12-03 – 2023-12-05 (×3): 40 mg via SUBCUTANEOUS
  Filled 2023-12-03 (×3): qty 0.4

## 2023-12-03 MED ORDER — FUROSEMIDE 10 MG/ML IJ SOLN: 40.0000 mg | Freq: Two times a day (BID) | INTRAMUSCULAR | Status: DC

## 2023-12-03 MED ORDER — IPRATROPIUM-ALBUTEROL 0.5-2.5 (3) MG/3ML IN SOLN
3.0000 mL | RESPIRATORY_TRACT | Status: AC
  Administered 2023-12-03 (×3): 3 mL via RESPIRATORY_TRACT
  Filled 2023-12-03: qty 9

## 2023-12-03 MED ORDER — FUROSEMIDE 10 MG/ML IJ SOLN
40.0000 mg | Freq: Once | INTRAMUSCULAR | Status: AC
  Administered 2023-12-03: 40 mg via INTRAVENOUS
  Filled 2023-12-03: qty 4

## 2023-12-03 MED ORDER — AZITHROMYCIN 250 MG PO TABS
500.0000 mg | ORAL_TABLET | Freq: Every day | ORAL | Status: AC
  Administered 2023-12-03 – 2023-12-05 (×3): 500 mg via ORAL
  Filled 2023-12-03 (×3): qty 2

## 2023-12-03 MED ORDER — CARVEDILOL 3.125 MG PO TABS
3.1250 mg | ORAL_TABLET | Freq: Two times a day (BID) | ORAL | Status: DC
  Administered 2023-12-03 – 2023-12-05 (×4): 3.125 mg via ORAL
  Filled 2023-12-03 (×4): qty 1

## 2023-12-03 MED ORDER — IPRATROPIUM-ALBUTEROL 0.5-2.5 (3) MG/3ML IN SOLN
3.0000 mL | RESPIRATORY_TRACT | Status: DC | PRN
  Administered 2023-12-03: 3 mL via RESPIRATORY_TRACT
  Filled 2023-12-03: qty 3

## 2023-12-03 MED ORDER — FLUTICASONE FUROATE-VILANTEROL 100-25 MCG/ACT IN AEPB: 1.0000 | INHALATION_SPRAY | Freq: Every day | RESPIRATORY_TRACT | Status: DC

## 2023-12-03 MED ORDER — FUROSEMIDE 10 MG/ML IJ SOLN
40.0000 mg | Freq: Two times a day (BID) | INTRAMUSCULAR | Status: DC
  Administered 2023-12-03 – 2023-12-04 (×2): 40 mg via INTRAVENOUS
  Filled 2023-12-03 (×2): qty 4

## 2023-12-03 MED ORDER — PREDNISONE 20 MG PO TABS
40.0000 mg | ORAL_TABLET | Freq: Every day | ORAL | Status: DC
  Administered 2023-12-04 – 2023-12-05 (×2): 40 mg via ORAL
  Filled 2023-12-03 (×2): qty 2

## 2023-12-03 MED ORDER — INSULIN GLARGINE-YFGN 100 UNIT/ML ~~LOC~~ SOLN
10.0000 [IU] | Freq: Every day | SUBCUTANEOUS | Status: DC
  Administered 2023-12-03 – 2023-12-05 (×3): 10 [IU] via SUBCUTANEOUS
  Filled 2023-12-03 (×3): qty 0.1

## 2023-12-03 MED ORDER — MELATONIN 3 MG PO TABS
3.0000 mg | ORAL_TABLET | Freq: Every day | ORAL | Status: DC
  Administered 2023-12-03 – 2023-12-04 (×2): 3 mg via ORAL
  Filled 2023-12-03 (×2): qty 1

## 2023-12-03 MED ORDER — IPRATROPIUM-ALBUTEROL 0.5-2.5 (3) MG/3ML IN SOLN: 3.0000 mL | RESPIRATORY_TRACT | Status: DC

## 2023-12-03 NOTE — Plan of Care (Signed)

## 2023-12-03 NOTE — ED Triage Notes (Signed)
 Pt bib [facility] from home. Pt reports being sob for last 3 days and chest pain. Upon ems arrival patient was 84% RA. EMS applied CPAP and patient 98%.   HX CHF, COPD  Pt was on cpap in April  18G LAC  EMS VS  176/98 98% Bipap 90 HR  RR 30

## 2023-12-03 NOTE — ED Provider Notes (Signed)
 [place] [name] EMERGENCY DEPARTMENT AT [facility] Provider Note   CSN: [id number] Arrival date & time: 12/03/23  0455     Patient presents with: Respiratory Distress and Chest Pain   Julie [name] is a 48 y.o. female.  {Add pertinent medical, surgical, social history, OB history to HPI:2511} 48 year old female with history of COPD and CHF who presents ER today secondary to dyspnea.  Patient states has been going on for few days but got suddenly worse tonight.  Called EMS.  On EMS arrival her oxygen saturation 84% on room air she was tachypneic.  She was brought here for further evaluation.  Patient denies any fever or productive cough.  She states she is not sure if this is more like her CHF or COPD.  No trauma.  She did have some right sided chest pain that is still ongoing right now but no other associated symptoms.  History of hypertension, edema, COPD, hyperlipidemia, diabetes.   Chest Pain      Prior to Admission medications   Medication Sig Start Date End Date Taking? Authorizing Provider  albuterol  (VENTOLIN  HFA) 108 (90 Base) MCG/ACT inhaler Inhale 2 puffs into the lungs every 6 (six) hours as needed for wheezing or shortness of breath. 08/25/23   [name], Alexandra, DO  furosemide  (LASIX ) 20 MG tablet Take 1 tablet (20 mg total) by mouth daily as needed for edema or fluid (for leg swelling). 09/27/23   [name], Julie Doss, MD  losartan  (COZAAR ) 50 MG tablet Take 1 tablet (50 mg total) by mouth daily. 09/27/23 10/27/23  [name], Julie Doss, MD  metFORMIN  (GLUCOPHAGE ) 500 MG tablet Take 1 tablet (500 mg total) by mouth 2 (two) times daily with a meal. 09/27/23 10/27/23  [name], Julie Doss, MD  spironolactone  (ALDACTONE ) 25 MG tablet Take 0.5 tablets (12.5 mg total) by mouth daily. 09/28/23   [name], Julie Doss, MD    Allergies: Patient has no known allergies.    Review of Systems  Cardiovascular:  Positive for chest pain.    Updated Vital Signs BP (!) 148/97    Pulse 79   Resp (!) 26   Ht 5' 10 (1.778 m)   Wt 116.1 kg   SpO2 100%   BMI 36.73 kg/m   Physical Exam Vitals and nursing note reviewed.  Constitutional:      Appearance: She is well-developed.  HENT:     Head: Normocephalic and atraumatic.   Cardiovascular:     Rate and Rhythm: Normal rate and regular rhythm.  Pulmonary:     Effort: Tachypnea present. No respiratory distress.     Breath sounds: No stridor. Examination of the right-lower field reveals wheezing and rales. Decreased breath sounds, wheezing and rales present.  Abdominal:     General: There is no distension.   Musculoskeletal:     Cervical back: Normal range of motion.   Neurological:     Mental Status: She is alert.     (all labs ordered are listed, but only abnormal results are displayed) Labs Reviewed  I-STAT VENOUS BLOOD GAS, ED - Abnormal; Notable for the following components:      Result Value   pCO2, Ven 42.9 (*)    pO2, Ven 65 (*)    All other components within normal limits  CBC WITH DIFFERENTIAL/PLATELET  COMPREHENSIVE METABOLIC PANEL WITH GFR  BRAIN NATRIURETIC PEPTIDE  TROPONIN I (HIGH SENSITIVITY)    EKG: EKG Interpretation Date/Time:  Saturday December 03 2023 05:02:36 EDT Ventricular Rate:  89 PR  Interval:  133 QRS Duration:  94 QT Interval:  405 QTC Calculation: 493 R Axis:   -46  Text Interpretation: Sinus rhythm LAE, consider biatrial enlargement Left anterior fascicular block LVH with secondary repolarization abnormality Anterior Q waves, possibly due to LVH Confirmed by Julie [name] [phone]) on 12/03/2023 5:55:48 AM  Radiology: Julie [name] Chest Portable 1 View Result Date: 12/03/2023 EXAM: 1 VIEW XRAY OF THE CHEST 12/03/2023 06:12:39 AM COMPARISON: 2-view chest x-ray 11/11/2023. CLINICAL HISTORY: Eval for dyspnea. FINDINGS: LUNGS AND PLEURA: Mild pulmonary vascular congestion has increased. No focal pulmonary opacity. No pulmonary edema. No pleural effusion. No pneumothorax. HEART AND  MEDIASTINUM: Cardiomegaly is stable. No acute abnormality of the cardiac and mediastinal silhouettes. BONES AND SOFT TISSUES: Chronic elevation of the right hemidiaphragm is again noted. No acute osseous abnormality. IMPRESSION: 1. Mild pulmonary vascular congestion, increased compared to prior study. 2. Stable cardiomegaly. Electronically signed by: Julie Leas MD 12/03/2023 06:21 AM EDT RP Workstation: [id number]    {Document cardiac monitor, telemetry assessment procedure when appropriate:32947} .Critical Care  Performed by: Julie Hinders, MD Authorized by: Julie Hinders, MD   Critical care provider statement:    Critical care time (minutes):  30   Critical care was necessary to treat or prevent imminent or life-threatening deterioration of the following conditions:  Respiratory failure   Critical care was time spent personally by me on the following activities:  Development of treatment plan with patient or surrogate, discussions with consultants, evaluation of patient's response to treatment, examination of patient, ordering and review of laboratory studies, ordering and review of radiographic studies, ordering and performing treatments and interventions, pulse oximetry, re-evaluation of patient's condition and review of old charts    Medications Ordered in the ED  ipratropium-albuterol  (DUONEB) 0.5-2.5 (3) MG/3ML nebulizer solution 3 mL (3 mLs Nebulization Given 12/03/23 0634)      {Click here for ABCD2, HEART and other calculators REFRESH Note before signing:1}                              Medical Decision Making Amount and/or Complexity of Data Reviewed Labs: ordered. Radiology: ordered.  Risk Prescription drug management.   Not clearly delineated whether this is COPD, CHF, pneumonia, PE or other possible causes.  Will see what her x-ray looks like.  Continue BiPAP at this point.  X-ray on my interpretation does not show any asymmetry in her lungs aside from elevated  hemidiaphragm on the right.  Has a BNP and troponin ordered but EKG is reassuring.  Will add on D-dimer as well.  Will go and start treatment for COPD as she does have wheezing without significant lower extremity edema.  Will quickly switch treatment choices based on her labs.  Likely need admitted.  {Document critical care time when appropriate  Document review of labs and clinical decision tools ie CHADS2VASC2, etc  Document your independent review of radiology images and any outside records  Document your discussion with family members, caretakers and with consultants  Document social determinants of health affecting pt's care  Document your decision making why or why not admission, treatments were needed:32947:::1}   Final diagnoses:  None    ED Discharge Orders     None

## 2023-12-03 NOTE — H&P (Cosign Needed Addendum)
 Date: 12/03/2023               Patient Name:  Julie [name] MRN: [id number]  DOB: Nov 14, 1975 Age / Sex: 48 y.o., female   PCP: Pcp, No              Medical Service: Internal Medicine Teaching Service              Attending Physician: Dr. Adriane Albe, Azell Boll, DO    First Contact: Darletta Ehrich, MS4    Second Contact: Dr. Fay Hoop    Third Contact Dr. Cleven Dallas         After Hours (After 5p/  First Contact Pager: [phone]  weekends / holidays): Second Contact Pager: [phone]   Chief Complaint: chest pain, shortness of breath  History of Present Illness:  Julie [name] is a 48 year-old female with PMH of asthma/COPD, HFpEF (EF 50-55%), HTN, T2DM, and polysubstance use (cocaine, marijuana, tobacco) who presents for chest pain and shortness of breath.  Patient started experiencing shortness of breath 3 days ago in the setting of worsening cough, increased reddish-green sputum production, and nasal congestion/rhinorrhea. She noticed these symptoms particularly at night despite sleeping with her head elevated (never sleeps lying flat). She does feel like she has been wheezing, so she was using her albuterol  inhaler 6-7 times daily before she ran out. Because of the increased work of breathing, she has been experiencing pleuritic chest pain when coughing or deep breathing. She describes this pain as sore, and the pain is reproducible when she presses on her chest.   Patient otherwise reports a burning abdominal pain that nearly resolved after starting CPAP today. Her legs also feel weaker when both sedentary or ambulating, and she has a burning sensation in her feet. She denies headache, sore throat, constipation, diarrhea, dysuria, hematuria, LE edema, fevers, chills, nausea, or vomiting. She chronically has palpitations. She denies sick contacts.  Upon EMS arrival, patient was saturating 84% on room air. She was started on CPAP @ 25 lpm and given sublingual nitroglycerin  x 2 with  improvement in saturations to 98%. Upon arrival to the ED, patient was tachypneic (RR 24), hypertensive (143/87), and saturating 100% on BiPAP @ 100% FiO2. Workup was notable for unremarkable CBC, glucose 184, Cr 0.78, troponin 30-> 28, BNP 343.2, VBG 7.382/42.9/65/25.5 -> 7.38/52/35/30.8, normal D-dimer, and CXR with mild pulmonary vascular congestion and stable cardiomegaly. She received IV Lasix  40 mg x1, DuoNeb x3, and IV methylprednisolone  125 mg x1.  Meds:  - albuterol  inhaler 2 puffs Q6H  Not currently taking: - furosemide  20 mg PRN for LE edema - losartan  50 mg daily - metformin  500 mg BID - spironolactone  12.5 mg daily  Allergies: None  Past Medical History:  Diagnosis Date   Asthma    Diabetes mellitus without complication (HCC)    Fracture, humerus closed, shaft 07/11/2014   left   History of bronchitis    Mental health problem    Open fracture of great toe of left foot 12/25/2015    Family History:  Patient cannot remember which family members have certain conditions, but she knows T2DM, HTN, COPD, CHF, stomach cancer, colon cancer, and prostate cancer run in her family.  Social History:  Patient has been living with her boyfriend in a motel room for the past 2 months. 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 She occasionally requires assistance with mobility, dressing, bathing when she first wakes up or is tired. She  has 2 children that live in [place] and regularly visit her. She started smoking 1 ppd when she was 48 years-old but decreased to 0.5 ppd in 08/2023 when diagnosed with heart failure (33 pack-years). She uses crack cocaine about twice weekly, last use 6/13. She uses marijuana 3-4 times per month. She denies alcohol , benzodiazepine, or heroin use. Her emergency contact is her sister, Ruffin Cotton ([phone]). PCP is Rheba Cedar, NP at [facility].  Review of Systems: A complete ROS  was negative except as per HPI.  Physical Exam: Blood pressure (!) 156/99, pulse 90, temperature 98.5 F (36.9 C), temperature source Axillary, resp. rate (!) 21, height 5' 10 (1.778 m), weight 116.1 kg, SpO2 100%. General: Middle-aged female sitting upright on edge of bed, anxious with BiPAP on but appears comfortable with mask off, in NAD. HENT: Normocephalic and atraumatic. Conjunctivae clear. Nasal congestion with dried rhinorrhea/mucus on face. Oropharynx without lesions, erythema, or exudate. Poor dentition. Cardiovascular: Normal rate with regular rhythm. No murmurs, rubs, or gallops. Normal radial pulses bilaterally. Pulmonary: Diffuse rhonchi present with occasional end expiratory wheezes more predominant at lung bases. Prolonged expiratory phase. Increased respiratory effort on room air.  Abdominal: Soft. Non-distended. Diffusely tender to palpation due to skin sensitivity/burning pain. Normal bowel sounds.  Musculoskeletal: Normal range of motion. No LE edema.  Neurological: Alert, responds appropriately to questions. Skin: Warm and dry. Cap refill <2 seconds.  EKG: personally reviewed my interpretation is NSR with LA enlargement  CXR: personally reviewed my interpretation is mild pulmonary congestion.  Assessment & Plan by Problem: Principal Problem:   Acute hypoxic respiratory failure (HCC)  #Acute hypoxic respiratory failure #COPD exacerbation Patient presents with a 3-day history of worsening dyspnea, increased cough, and increased greenish-red sputum production, suggesting a COPD exacerbation. She was initially hypoxic to 84% on RA, requiring CPAP @ 25 lpm with EMS and then BiPAP @ 50% FiO2 in the ED to maintain saturations. Workup notable for VBG 7.382/42.9/65/25.5 and normal D-dimer. Upon evaluation after IV methylprednisolone  125 mg x1 and DuoNeb x 3, she was saturating 92-96% on room air with diffuse rhonchi and occasional end-expiratory wheezes on exam. Repeat VBG  7.38/52/35/30.8, so we may need to restart BiPAP if CO2 continues to increase while oxygen decreases. Will start DuoNebs Q4H PRN, prednisone  40 mg daily, and azithromycin  500 mg x 3 days. Since this admission will be her second hospitalization for a COPD exacerbation in the past 2 months ([facility]), will also start Anoro-Ellipta for maintenance inhaler, no ICS because not indicated with eosinophil count <300.  - S/p IV Solu-Medrol  125 mg x 1  - Prednisone  40 mg daily x 4 doses (start 6/15) - DuoNebs Q4H PRN - Anoro-Ellipta 1 puff daily - Azithromycin  500 mg daily x 3 doses (start 6/14) - Flutter valve and incentive spirometer - Supplemental oxygen for SpO2 <88% -> BiPAP if retaining CO2 - Follow-up procalcitonin, RPP, VBG - PFTs outpatient  #HFpEF (EF 50-55%) #Hypertension Patient appears euvolemic on exam without LE edema or crackles on lung auscultation. However, initial workup notable for elevated BNP (343.2) and CXR with mild pulmonary vascular congestion and stable cardiomegaly. With these findings, patient may be experiencing a mild HFpEF exacerbation, especially since she has not been taking any medications for her HFpEF or hypertension at home. Will try diuresing with IV Lasix  40 mg BID. Can consider starting carvedilol  for better BP control. She was prescribed furosemide , spironolactone , and losartan  when discharged in 09/2023 but reports this  regimen was too confusing. Will hold off on repeating echo at this time since echocardiogram was just completed in 08/2023 with EF 50 to 55%, low normal LV function, mild concentric LVH, indeterminate diastolic filling due to E-A fusion, and mild LA dilation. However, if patient fails to improve with treatment of COPD exacerbation as above, can consider repeat echocardiogram to assess heart function. - s/p IV Lasix  40 mg x1 - IV Lasix  40 mg BID - Consider starting carvedilol  for better BP control - Repeat BMP tomorrow AM  #T2DM Glucose elevated  between 184-236 in the ED. She was prescribed metformin  500 mg BID when discharged in 09/2023, but she is no longer taking this medication. Will start SSI with meals while hospitalized. - SSI with meals - CBG QID  #Polysubstance use (cocaine, marijuana, tobacco) #SDOH Patient reports current tobacco (0.5 ppd), crack cocaine (twice weekly), and marijuana (3-4 times monthly) use. This polysubstance use is contributing to her COPD, HFpEF, and hypertension, so will consult TOC for cessation resources. Patient also with housing instability (lives in [place]) and limited finances, so will consult TOC for medication assistance. - TOC consult - Continue cessation counseling  Dispo: Admit patient to Observation with expected length of stay less than 2 midnights.  Signed: Elmira Haddock, Medical Student 12/03/2023, 11:11 AM   Attestation for Student Documentation:  I personally was present and re-performed the history, physical exam and medical decision-making activities of this service and have verified that the service and findings are accurately documented in the student's note.  Cleven Dallas, DO 12/03/2023, 3:00 PM

## 2023-12-03 NOTE — ED Provider Notes (Signed)
  Physical Exam  BP (!) 156/99   Pulse 90   Resp (!) 21   Ht 5' 10 (1.778 m)   Wt 116.1 kg   SpO2 100%   BMI 36.73 kg/m   Physical Exam  Procedures  Procedures  ED Course / MDM    Medical Decision Making Amount and/or Complexity of Data Reviewed Labs: ordered. Radiology: ordered.  Risk Prescription drug management. Decision regarding hospitalization.   Received care of patient from Dr. Luberta Ruse.  Please see his note for prior history, physical and care.  Briefly this is a 48 year old female with a history of CHF, COPD, 84% O2 with EMS, placed on CPAP.  On BiPAP now.  Chest x-ray shows mild pulmonary vascular congestion which is increased compared to prior.  Labs without clinically significant electrolyte abnormalities.  No clinically significant anemia.  Troponin is mildly elevated and similar to prior.  BNP is 343, which is increased from March 2025 when it was 36.3, but similar to April.  D-dimer is negative and have low suspicion for PE.  Reevaluated patient who does still have some wheezing on exam.  Appears comfortable on BiPAP.  Concern for multifactorial hypoxic respiratory failure secondary to CHF and COPD exacerbation.  She was given IV Lasix , Solu-Medrol , DuoNebs.  Will admit for further care.   Scarlette Currier, MD 12/04/23 [phone]

## 2023-12-04 DIAGNOSIS — J441 Chronic obstructive pulmonary disease with (acute) exacerbation: Secondary | ICD-10-CM | POA: Diagnosis present

## 2023-12-04 DIAGNOSIS — R0603 Acute respiratory distress: Secondary | ICD-10-CM | POA: Diagnosis present

## 2023-12-04 DIAGNOSIS — F17210 Nicotine dependence, cigarettes, uncomplicated: Secondary | ICD-10-CM | POA: Diagnosis present

## 2023-12-04 DIAGNOSIS — E1165 Type 2 diabetes mellitus with hyperglycemia: Secondary | ICD-10-CM | POA: Diagnosis present

## 2023-12-04 DIAGNOSIS — Z8249 Family history of ischemic heart disease and other diseases of the circulatory system: Secondary | ICD-10-CM | POA: Diagnosis not present

## 2023-12-04 DIAGNOSIS — Z59819 Housing instability, housed unspecified: Secondary | ICD-10-CM | POA: Diagnosis not present

## 2023-12-04 DIAGNOSIS — Z91138 Patient's unintentional underdosing of medication regimen for other reason: Secondary | ICD-10-CM | POA: Diagnosis not present

## 2023-12-04 DIAGNOSIS — F1490 Cocaine use, unspecified, uncomplicated: Secondary | ICD-10-CM | POA: Diagnosis present

## 2023-12-04 DIAGNOSIS — Z833 Family history of diabetes mellitus: Secondary | ICD-10-CM | POA: Diagnosis not present

## 2023-12-04 DIAGNOSIS — I110 Hypertensive heart disease with heart failure: Secondary | ICD-10-CM | POA: Diagnosis present

## 2023-12-04 DIAGNOSIS — Z800 Family history of malignant neoplasm of digestive organs: Secondary | ICD-10-CM | POA: Diagnosis not present

## 2023-12-04 DIAGNOSIS — Z825 Family history of asthma and other chronic lower respiratory diseases: Secondary | ICD-10-CM | POA: Diagnosis not present

## 2023-12-04 DIAGNOSIS — Z556 Problems related to health literacy: Secondary | ICD-10-CM | POA: Diagnosis not present

## 2023-12-04 DIAGNOSIS — F1290 Cannabis use, unspecified, uncomplicated: Secondary | ICD-10-CM | POA: Diagnosis present

## 2023-12-04 DIAGNOSIS — I5033 Acute on chronic diastolic (congestive) heart failure: Secondary | ICD-10-CM | POA: Diagnosis not present

## 2023-12-04 DIAGNOSIS — Z79899 Other long term (current) drug therapy: Secondary | ICD-10-CM | POA: Diagnosis not present

## 2023-12-04 DIAGNOSIS — Z7984 Long term (current) use of oral hypoglycemic drugs: Secondary | ICD-10-CM | POA: Diagnosis not present

## 2023-12-04 DIAGNOSIS — J9601 Acute respiratory failure with hypoxia: Secondary | ICD-10-CM | POA: Diagnosis present

## 2023-12-04 DIAGNOSIS — T501X6A Underdosing of loop [high-ceiling] diuretics, initial encounter: Secondary | ICD-10-CM | POA: Diagnosis present

## 2023-12-04 LAB — BASIC METABOLIC PANEL WITH GFR
Anion gap: 8 (ref 5–15)
BUN: 11 mg/dL (ref 6–20)
CO2: 25 mmol/L (ref 22–32)
Calcium: 10.4 mg/dL — ABNORMAL HIGH (ref 8.9–10.3)
Chloride: 107 mmol/L (ref 98–111)
Creatinine, Ser: 0.73 mg/dL (ref 0.44–1.00)
GFR, Estimated: >60 mL/min (ref >60)
Glucose, Bld: 226 mg/dL — ABNORMAL HIGH (ref 70–99)
Potassium: 3.9 mmol/L (ref 3.5–5.1)
Sodium: 140 mmol/L (ref 135–145)

## 2023-12-04 LAB — GLUCOSE, CAPILLARY
Glucose-Capillary: 256 mg/dL — ABNORMAL HIGH (ref 70–99)
Glucose-Capillary: 260 mg/dL — ABNORMAL HIGH (ref 70–99)
Glucose-Capillary: 315 mg/dL — ABNORMAL HIGH (ref 70–99)

## 2023-12-04 MED ORDER — LOSARTAN POTASSIUM 25 MG PO TABS
25.0000 mg | ORAL_TABLET | Freq: Every day | ORAL | Status: DC
  Administered 2023-12-04 – 2023-12-05 (×2): 25 mg via ORAL
  Filled 2023-12-04 (×2): qty 1

## 2023-12-04 MED ORDER — FUROSEMIDE 20 MG PO TABS
20.0000 mg | ORAL_TABLET | Freq: Every day | ORAL | Status: DC
  Administered 2023-12-04 – 2023-12-05 (×2): 20 mg via ORAL
  Filled 2023-12-04 (×2): qty 1

## 2023-12-04 MED ORDER — GUAIFENESIN-DM 100-10 MG/5ML PO SYRP: 5.0000 mL | ORAL_SOLUTION | ORAL | Status: DC | PRN

## 2023-12-04 NOTE — Progress Notes (Signed)
 RT placed CPAP on patient and she immediately removed the mask. Patient stated that she could not tolerate it.

## 2023-12-04 NOTE — Progress Notes (Signed)
 HD#0 SUBJECTIVE:  Patient Summary:Julie [name] is a 48 year-old female with PMH of asthma/COPD, HFpEF (EF 50-55%), HTN, T2DM, and polysubstance use (cocaine, marijuana, tobacco) who presents for chest pain and shortness of breath and admitted for COPD exacerbation and possible acute on chronic heart failure exacerbation.   Overnight Events: Admitted  Interim History: The patient was seen at the bedside this morning, eating breakfast and appearing in no acute distress. She has been successfully weaned from CPAP to room air, with significant improvement following breathing treatments. We had a detailed discussion about how nonadherence to prescribed medications and illicit drug use may have contributed to her current condition. She demonstrated an understanding of the risks associated with smoking and substance use and expressed a commitment to making healthier choices moving forward.  OBJECTIVE:  Vital Signs: Vitals:   12/03/23 2101 12/03/23 2345 12/04/23 0550 12/04/23 0810  BP: (!) 166/103 (!) 151/86 (!) 152/92 (!) 134/121  Pulse:  92 90 92  Resp:  20 18 18   Temp:  98.9 F (37.2 C) 98.6 F (37 C) 98.4 F (36.9 C)  TempSrc:  Oral Oral Oral  SpO2:  98% 100%   Weight:   116.4 kg   Height:       Supplemental O2: Room Air SpO2: 100 % FiO2 (%): 50 %  Filed Weights   12/03/23 0459 12/03/23 1200 12/04/23 0550  Weight: 116.1 kg 117.5 kg 116.4 kg     Intake/Output Summary (Last 24 hours) at 12/04/2023 1212 Last data filed at 12/04/2023 0300 Gross per 24 hour  Intake 711 ml  Output --  Net 711 ml   Net IO Since Admission: 711 mL [12/04/23 1212]  Physical Exam: Constitutional: Well-appearing woman, sitting in bed. In no acute distress. Cardio:Regular rate and rhythm.  Pulm: Normal work of breathing on room air. Skin:Warm and dry. Neuro:Alert and oriented x3 Psych:Pleasant mood and affect.   Patient Lines/Drains/Airways Status     Active Line/Drains/Airways     Name  Placement date Placement time Site Days   Peripheral IV 12/03/23 18 G Anterior;Left;Proximal Forearm 12/03/23  --  Forearm  1             ASSESSMENT/PLAN:  Assessment: Principal Problem:   Acute hypoxic respiratory failure (HCC)   Plan: # Acute hypoxic respiratory failure # Asthma The patient presented with worsening shortness of breath and oxygen saturation of 84% on room air. She was initially placed on CPAP and later transitioned to BiPAP. Her acute hypoxic respiratory failure is most likely due to a COPD exacerbation, potentially triggered by drug use and pulmonary vascular congestion noted on chest X-ray. This morning, she appears stable and is not experiencing any respiratory distress. Both her procalcitonin and respiratory panel are negative. The diagnosis of COPD remains uncertain, and her financial constraints may limit access to outpatient pulmonary function testing to confirm the diagnosis. - Continue prednisone  40 mg daily - Continue DuoNebs every 4 hours as needed - Pulmonary toilet - Give a second dose of azithromycin  500 mg today   #HFpEF (EF 50-55%) #Hypertension BNP was elevated at 343.2, with evidence of vascular congestion likely related to crack cocaine use. The patient has a history of being prescribed Lasix  but has not been consistently adherent, possibly due to financial limitations or low health literacy. Medication adherence was further addressed during our discussion this morning. It appears that the [facility] team has previously worked with the patient and was able to enroll her in an affordable medication plan.  Patient appeared to be volume overloaded to me on exam.  Will discontinue IV Lasix  and resume her home dose of furosemide  20 mg daily. BP of 134/121, not at goal, will benefit from adding an ARB. - Switch to home PO furosemide  20 mg daily - Monitor volume status - Add losartan  25 mg Daily  - Continue coreg  3.125 BID    # Type 2 diabetes Her last  hemoglobin A1c was 8.5, recorded three months ago. Current blood glucose levels remain consistently elevated, ranging between 226 and 256. This may be partially attributed to her recent prednisone  taper, although her A1c remains above target. She was previously prescribed metformin  but has not been taking it. We will continue sliding scale insulin  during her hospitalization and plan to resume metformin  upon discharge. - SSI - CBG - Resume home metformin  at discharge   #Polysubstance use (cocaine, marijuana, tobacco) #SDOH The rapid drug screen on admission was positive for cocaine and opiates. Chart review indicates a history of cocaine use over the past six years. We had a thorough discussion regarding how nonadherence to prescribed medications and ongoing illicit drug use may have contributed to her current condition. She acknowledged the risks associated with smoking and substance use and expressed a commitment to making healthier decisions moving forward. - Continue substance abuse counseling  Best Practice: Diet: Regular diet VTE: enoxaparin  (LOVENOX ) injection 40 mg Start: 12/03/23 1100 Code: Full   Signature: Fay Hoop , MD Internal Medicine Resident, PGY-1 [facility]  Pager: [phone] 12:12 PM, 12/04/2023   Please contact the on call pager after 5 pm and on weekends at [phone].

## 2023-12-05 ENCOUNTER — Other Ambulatory Visit (HOSPITAL_COMMUNITY): Payer: Self-pay

## 2023-12-05 ENCOUNTER — Telehealth: Payer: Self-pay | Admitting: Nurse Practitioner

## 2023-12-05 ENCOUNTER — Telehealth (HOSPITAL_COMMUNITY): Payer: Self-pay | Admitting: Pharmacy Technician

## 2023-12-05 LAB — BASIC METABOLIC PANEL WITH GFR
Anion gap: 9 (ref 5–15)
BUN: 14 mg/dL (ref 6–20)
CO2: 24 mmol/L (ref 22–32)
Calcium: 9.9 mg/dL (ref 8.9–10.3)
Chloride: 109 mmol/L (ref 98–111)
Creatinine, Ser: 0.7 mg/dL (ref 0.44–1.00)
GFR, Estimated: >60 mL/min (ref >60)
Glucose, Bld: 174 mg/dL — ABNORMAL HIGH (ref 70–99)
Potassium: 3.7 mmol/L (ref 3.5–5.1)
Sodium: 142 mmol/L (ref 135–145)

## 2023-12-05 LAB — GLUCOSE, CAPILLARY: Glucose-Capillary: 214 mg/dL — ABNORMAL HIGH (ref 70–99)

## 2023-12-05 MED ORDER — DAPAGLIFLOZIN PROPANEDIOL 5 MG PO TABS
5.0000 mg | ORAL_TABLET | Freq: Every day | ORAL | 0 refills | Status: DC
  Filled 2023-12-05: qty 30, 30d supply, fill #0

## 2023-12-05 MED ORDER — LOSARTAN POTASSIUM 50 MG PO TABS
50.0000 mg | ORAL_TABLET | Freq: Every day | ORAL | 0 refills | Status: DC
  Filled 2023-12-05: qty 30, 30d supply, fill #0

## 2023-12-05 MED ORDER — PREDNISONE 20 MG PO TABS
40.0000 mg | ORAL_TABLET | Freq: Every day | ORAL | 0 refills | Status: DC
  Filled 2023-12-05: qty 4, 2d supply, fill #0

## 2023-12-05 MED ORDER — CARVEDILOL 6.25 MG PO TABS
6.2500 mg | ORAL_TABLET | Freq: Two times a day (BID) | ORAL | 0 refills | Status: DC
  Filled 2023-12-05: qty 60, 30d supply, fill #0

## 2023-12-05 MED ORDER — METOPROLOL TARTRATE 25 MG PO TABS
25.0000 mg | ORAL_TABLET | Freq: Two times a day (BID) | ORAL | 11 refills | Status: DC
  Filled 2023-12-05: qty 60, 30d supply, fill #0

## 2023-12-05 MED ORDER — ENTRESTO 49-51 MG PO TABS
1.0000 | ORAL_TABLET | Freq: Two times a day (BID) | ORAL | 0 refills | Status: DC
  Filled 2023-12-05: qty 60, 30d supply, fill #0

## 2023-12-05 MED ORDER — ENTRESTO 24-26 MG PO TABS
1.0000 | ORAL_TABLET | Freq: Two times a day (BID) | ORAL | 0 refills | Status: DC
Start: 2023-12-05 — End: 2023-12-05
  Filled 2023-12-05: qty 60, 30d supply, fill #0

## 2023-12-05 MED ORDER — LOSARTAN POTASSIUM 50 MG PO TABS
50.0000 mg | ORAL_TABLET | Freq: Every day | ORAL | Status: DC
  Filled 2023-12-05: qty 1

## 2023-12-05 MED ORDER — METFORMIN HCL 500 MG PO TABS
500.0000 mg | ORAL_TABLET | Freq: Two times a day (BID) | ORAL | 0 refills | Status: DC
  Filled 2023-12-05: qty 60, 30d supply, fill #0

## 2023-12-05 MED ORDER — FUROSEMIDE 20 MG PO TABS
20.0000 mg | ORAL_TABLET | Freq: Every day | ORAL | 0 refills | Status: DC
  Filled 2023-12-05: qty 30, 30d supply, fill #0

## 2023-12-05 MED ORDER — CARVEDILOL 6.25 MG PO TABS: 6.2500 mg | ORAL_TABLET | Freq: Two times a day (BID) | ORAL | Status: DC

## 2023-12-05 MED ORDER — PREDNISONE 20 MG PO TABS
40.0000 mg | ORAL_TABLET | Freq: Every day | ORAL | 0 refills | Status: DC
  Filled 2023-12-05: qty 6, 3d supply, fill #0

## 2023-12-05 MED ORDER — UMECLIDINIUM-VILANTEROL 62.5-25 MCG/ACT IN AEPB
1.0000 | INHALATION_SPRAY | Freq: Every day | RESPIRATORY_TRACT | 0 refills | Status: DC
  Filled 2023-12-05: qty 60, 30d supply, fill #0

## 2023-12-05 MED ORDER — DAPAGLIFLOZIN PROPANEDIOL 5 MG PO TABS: 5.0000 mg | ORAL_TABLET | Freq: Every day | ORAL | 0 refills | Status: DC

## 2023-12-05 MED ORDER — UMECLIDINIUM-VILANTEROL 62.5-25 MCG/ACT IN AEPB
1.0000 | INHALATION_SPRAY | Freq: Every day | RESPIRATORY_TRACT | 0 refills | Status: DC
  Filled 2023-12-05: qty 60, 60d supply, fill #0

## 2023-12-05 NOTE — Discharge Summary (Signed)
 Name: Julie [name] MRN: [id number] DOB: 11-Oct-1975 48 y.o. PCP: Pcp, No  Date of Admission: 12/03/2023  4:55 AM Date of Discharge: 12/05/2023  Attending Physician: Dr. Alwin Baars  DISCHARGE DIAGNOSIS:  Primary Problem: Acute hypoxic respiratory failure [facility])   Hospital Problems: Principal Problem:   Acute hypoxic respiratory failure (HCC)    DISCHARGE MEDICATIONS:   Allergies as of 12/05/2023   No Known Allergies      Medication List     TAKE these medications    acetaminophen  500 MG tablet Commonly known as: TYLENOL  Take 1,000 mg by mouth every 6 (six) hours as needed.   albuterol  108 (90 Base) MCG/ACT inhaler Commonly known as: VENTOLIN  HFA Inhale 2 puffs into the lungs every 6 (six) hours as needed for wheezing or shortness of breath.   Anoro Ellipta 62.5-25 MCG/ACT Aepb Generic drug: umeclidinium-vilanterol Inhale 1 puff into the lungs daily. Start taking on: December 06, 2023   carvedilol  6.25 MG tablet Commonly known as: COREG  Take 1 tablet (6.25 mg total) by mouth 2 (two) times daily with a meal.   Farxiga 5 MG Tabs tablet Generic drug: dapagliflozin propanediol Take 1 tablet (5 mg total) by mouth daily before breakfast.   furosemide  20 MG tablet Commonly known as: LASIX  Take 1 tablet (20 mg total) by mouth daily. Start taking on: December 06, 2023   losartan  50 MG tablet Commonly known as: COZAAR  Take 1 tablet (50 mg total) by mouth daily. Start taking on: December 06, 2023   metFORMIN  500 MG tablet Commonly known as: GLUCOPHAGE  Take 1 tablet (500 mg total) by mouth 2 (two) times daily with a meal.   predniSONE  20 MG tablet Commonly known as: DELTASONE  Take 2 tablets (40 mg total) by mouth daily with breakfast. Start taking on: December 06, 2023        DISPOSITION AND FOLLOW-UP:  Julie [name] was discharged from [facility] in stable condition. At the hospital follow up visit please address:  Follow-up Recommendations: -  Repeat BMP since restarting losartan  and furosemide  - Consider consolidating antihypertensive regimen to Entresto 59-51 mg to help compliance - Adjust diabetes medications as needed for glucose control  - Continue polysubstance use cessation counseling  Follow-up Appointments:  Follow-up Information     McElwee, Lauren A, NP Follow up.   Specialty: Internal Medicine Why: TIME : 1:20 PM   PLEASE ARRIVE AT 1:00 PM DATE : December 16, 2023  PLEASE BRING ALL MEDICATION, ID and INS CARD Contact information: [address] [place] [address] [phone]                HOSPITAL COURSE:  Patient Summary: Julie [name] is a 48 year-old female with PMH of asthma/COPD, HFpEF (EF 50-55%), HTN, T2DM, and polysubstance use (cocaine, marijuana, tobacco) who presents for chest pain and shortness of breath.   #Acute hypoxic respiratory failure #COPD exacerbation Patient presented with a 3-day history of worsening dyspnea, increased cough, and increased greenish-red sputum production. She was initially hypoxic to 84% on RA, requiring CPAP @ 25 lpm with EMS and then BiPAP @ 50% FiO2 in the ED to maintain saturations. Initial workup notable for VBG 7.382/42.9/65/25.5, RPP negative, normal procalcitonin, and normal D-dimer. Upon evaluation after IV methylprednisolone  125 mg x1 and DuoNeb x 3, patient was saturating 92-96% on room air with diffuse rhonchi and occasional end-expiratory wheezes on exam. Presentation was consistent with a COPD exacerbation. Patient was started on DuoNebs Q4H PRN, prednisone  40 mg daily  x 4 days, and azithromycin  500 mg daily x 3 days. Since this admission was her second hospitalization for a COPD exacerbation in the past 2 months (Gold group E), patient was also started on Anoro-Ellipta for maintenance inhaler (no ICS since eosinophil count <300). Patient significantly improved with these interventions and was discharged with Anora-Ellipta and 2 remaining doses of  prednisone  40 mg.   #HFpEF (EF 50-55%) #Hypertension Patient appeared euvolemic on exam without LE edema or crackles on lung auscultation. However, initial workup notable for elevated BNP (343.2) and CXR with mild pulmonary vascular congestion and stable cardiomegaly. With these findings, patient was thought to be experiencing a mild HFpEF exacerbation, especially since she was not taking any medications for her HFpEF or hypertension at home while also using cocaine. Patient was diuresed with IV Lasix  40 mg x 3. She was started on carvedilol  6.25 mg BID, losartan  50 mg daily, and furosemide  20 mg daily. Echo was not completed during this hospitalization since recently completed in 08/2023 with EF 50 to 55%, low normal LV function, mild concentric LVH, indeterminate diastolic filling due to E-A fusion, and mild LA dilation.    #T2DM Glucose elevated throughout hospitalization, ranging between 174-315 in the 24 hours prior to discharge. Her diabetes was managed with SSI and insulin  glargine 10 units nightly while hospitalized. She was transitioned to metformin  500 mg BID and Farxiga 10 mg daily at discharge. She will need her glucose checked at her PCP follow-up to see if additional diabetes medications are needed.   #Polysubstance use (cocaine, marijuana, tobacco) #SDOH Patient reported current tobacco (0.5 ppd), crack cocaine (twice weekly), and marijuana (3-4 times monthly) use on admission. She was extensively educated about how polysubstance use contributes to COPD, HFpEF, and hypertension. She demonstrated an understanding of the risks associated with smoking and substance use and expressed a commitment to making healthier choices moving forward. Patient also reported housing instability (lives in [place]) and limited finances, so TOC was consulted for medication assistance.   SUBJECTIVE:  Patient is feeling well this morning aside from some irritability and insomnia from withdrawals. She was able to  walk the hallways without shortness of breath or chest pain. She would like to leave the hospital as soon as possible.  Discharge Vitals:   BP (!) 161/104 (BP Location: Left Arm)   Pulse 92   Temp 98.3 F (36.8 C)   Resp 17   Ht 5' 10 (1.778 m)   Wt 117.8 kg   SpO2 100%   BMI 37.28 kg/m   OBJECTIVE:  General: Middle-aged female sitting upright on edge of bed, somewhat anxious but in NAD. Cardiovascular: Normal rate with regular rhythm. No murmurs, rubs, or gallops. Pulmonary: Diffuse rhonchi present with occasional end expiratory wheezes when coughing. Prolonged expiratory phase. Normal respiratory effort on room air.  Abdominal: Normal bowel sounds.  Musculoskeletal: Normal range of motion. No LE edema.  Neurological: Alert, responds appropriately to questions. Skin: Warm and dry. Cap refill <2 seconds.  Pertinent Labs, Studies, and Procedures:     Latest Ref Rng & Units 12/03/2023    6:24 AM 12/03/2023    5:44 AM 10/10/2023    9:07 PM  CBC  WBC 4.0 - 10.5 K/uL  9.3  8.8   Hemoglobin 12.0 - 15.0 g/dL 52.8  41.3  24.4   Hematocrit 36.0 - 46.0 % 37.0  41.6  42.4   Platelets 150 - 400 K/uL  207  262        Latest  Ref Rng & Units 12/05/2023    4:33 AM 12/04/2023    5:42 AM 12/03/2023    6:24 AM  CMP  Glucose 70 - 99 mg/dL 161  096    BUN 6 - 20 mg/dL 14  11    Creatinine 0.45 - 1.00 mg/dL 4.09  8.11    Sodium 914 - 145 mmol/L 142  140  143   Potassium 3.5 - 5.1 mmol/L 3.7  3.9  4.8   Chloride 98 - 111 mmol/L 109  107    CO2 22 - 32 mmol/L 24  25    Calcium  8.9 - 10.3 mg/dL 9.9  78.2      DG Chest Portable 1 View Result Date: 12/03/2023 FINDINGS: LUNGS AND PLEURA: Mild pulmonary vascular congestion has increased. No focal pulmonary opacity. No pulmonary edema. No pleural effusion. No pneumothorax. HEART AND MEDIASTINUM: Cardiomegaly is stable. No acute abnormality of the cardiac and mediastinal silhouettes. BONES AND SOFT TISSUES: Chronic elevation of the right  hemidiaphragm is again noted. No acute osseous abnormality. IMPRESSION: 1. Mild pulmonary vascular congestion, increased compared to prior study. 2. Stable cardiomegaly. Electronically signed by: Audree Leas MD 12/03/2023 06:21 AM EDT RP Workstation: [id number]     Signed: Attestation for Student Documentation:  I personally was present and re-performed the history, physical exam and medical decision-making activities of this service and have verified that the service and findings are accurately documented in the student's note.  Fay Hoop, MD 12/05/2023, 1:28 PM

## 2023-12-05 NOTE — TOC Initial Note (Signed)
 Transition of Care [facility]) - Initial/Assessment Note    Patient Details  Name: DASIAH HOOLEY MRN: [id number] Date of Birth: [birth year]  Transition of [facility]) CM/SW Contact:    Carmon Christen, LCSWA Phone Number: 12/05/2023, 10:35 AM  Clinical Narrative:                    CSW spoke with patient at bedside. Patient reports PTA she comes from hotel. Patient reports plan is to return to hotel at dc. CSW offered patient outpatient substance use treatment services resources,Housing resources.area shelter resources,and [facility] resources guide resources. Patient accepted all resources. Patient will need transportation assistance at dc. All questions answered. No further questions reported at this time.     Patient Goals and CMS Choice            Expected Discharge Plan and Services                                              Prior Living Arrangements/Services                       Activities of Daily Living   ADL Screening (condition at time of admission) Independently performs ADLs?: Yes (appropriate for developmental age) Is the patient deaf or have difficulty hearing?: No Does the patient have difficulty seeing, even when wearing glasses/contacts?: No Does the patient have difficulty concentrating, remembering, or making decisions?: No  Permission Sought/Granted                  Emotional Assessment              Admission diagnosis:  Acute hypoxic respiratory failure (HCC) [J96.01] Patient Active Problem List   Diagnosis Date Noted   Acute hypoxic respiratory failure (HCC) 12/03/2023   Heart failure (HCC) 09/25/2023   Uncontrolled type 2 diabetes mellitus with hyperglycemia, without long-term current use of insulin  (HCC) 09/23/2023   Acute on chronic diastolic CHF (congestive heart failure) (HCC) 09/23/2023   Asthma, chronic 09/23/2023   Costochondritis 09/23/2023   Hypomagnesemia 09/23/2023   Acute pulmonary edema with  congestive heart failure (HCC) 09/21/2023   Asthma, chronic, unspecified asthma severity, with acute exacerbation 08/22/2023   Lower extremity edema 08/22/2023   Elevated brain natriuretic peptide (BNP) level 08/22/2023   Concern for new onset of CHF with exacerbation (congestive heart failure) (HCC) 08/22/2023   Hypokalemia 08/22/2023   Essential hypertension 08/22/2023   Elevated troponin 08/22/2023   Asthma exacerbation 08/22/2023   Continuous dependence on cigarette smoking 08/22/2023   Shortness of breath 08/22/2023   Cocaine use disorder, severe, dependence (HCC) 12/12/2016   PCP:  Vicente Graham, No Pharmacy:   [id number] - [place] - [address] [place] [address] Phone: [phone] Fax: [phone]  [facility] [address] [place] [address] Phone: [phone] Fax: [phone]     Social Drivers of Health (SDOH) Social History: SDOH Screenings   Food Insecurity: Food Insecurity Present (12/03/2023)  Housing: High Risk (12/03/2023)  Transportation Needs: Unmet Transportation Needs (12/03/2023)  Utilities: Not At Risk (12/03/2023)  Recent Concern: Utilities - At Risk (09/24/2023)  Alcohol  Screen: Low Risk  (03/18/2023)  Depression (PHQ2-9): Medium Risk (03/05/2019)  Tobacco Use: High Risk (12/03/2023)   SDOH Interventions:  Readmission Risk Interventions    09/27/2023   12:14 PM  Readmission Risk Prevention Plan  Transportation Screening Complete  PCP or Specialist Appt within 3-5 Days Complete  HRI or Home Care Consult Complete  Social Work Consult for Recovery Care Planning/Counseling Complete  Palliative Care Screening Not Applicable  Medication Review Oceanographer) Complete

## 2023-12-05 NOTE — Telephone Encounter (Unsigned)
 Copied from CRM [id number]. Topic: Appointments - Appointment Scheduling >> Dec 05, 2023 11:33 AM Pam Bode wrote: Patient/patient representative is calling to schedule an appointment. Refer to attachments for appointment information. Patient is looking to establish care as well. She wants Dr Ricke Charleston as her PCP.

## 2023-12-05 NOTE — Progress Notes (Signed)
 Patient refused HS blood sugar check

## 2023-12-05 NOTE — Hospital Course (Addendum)
 Julie [name] is a 48 year-old female with PMH of asthma/COPD, HFpEF (EF 50-55%), HTN, T2DM, and polysubstance use (cocaine, marijuana, tobacco) who presents for chest pain and shortness of breath.   #Acute hypoxic respiratory failure #COPD exacerbation Patient presented with a 3-day history of worsening dyspnea, increased cough, and increased greenish-red sputum production. She was initially hypoxic to 84% on RA, requiring CPAP @ 25 lpm with EMS and then BiPAP @ 50% FiO2 in the ED to maintain saturations. Initial workup notable for VBG 7.382/42.9/65/25.5, RPP negative, normal procalcitonin, and normal D-dimer. Upon evaluation after IV methylprednisolone  125 mg x1 and DuoNeb x 3, patient was saturating 92-96% on room air with diffuse rhonchi and occasional end-expiratory wheezes on exam. Presentation was consistent with a COPD exacerbation. Patient was started on DuoNebs Q4H PRN, prednisone  40 mg daily x 4 days, and azithromycin  500 mg daily x 3 days. Since this admission was her second hospitalization for a COPD exacerbation in the past 2 months (Gold group E), patient was also started on Anoro-Ellipta for maintenance inhaler (no ICS since eosinophil count <300). Patient significantly improved with these interventions and was discharged with Anora-Ellipta and 2 remaining doses of prednisone  40 mg.   #HFpEF (EF 50-55%) #Hypertension Patient appeared euvolemic on exam without LE edema or crackles on lung auscultation. However, initial workup notable for elevated BNP (343.2) and CXR with mild pulmonary vascular congestion and stable cardiomegaly. With these findings, patient was thought to be experiencing a mild HFpEF exacerbation, especially since she was not taking any medications for her HFpEF or hypertension at home while also using cocaine. Patient was diuresed with IV Lasix  40 mg x 3. She was started on carvedilol  6.25 mg BID, losartan  50 mg daily, and furosemide  20 mg daily. Echo was not completed  during this hospitalization since recently completed in 08/2023 with EF 50 to 55%, low normal LV function, mild concentric LVH, indeterminate diastolic filling due to E-A fusion, and mild LA dilation.    #T2DM Glucose elevated throughout hospitalization, ranging between 174-315 in the 24 hours prior to discharge. Her diabetes was managed with SSI and insulin  glargine 10 units nightly while hospitalized. She was transitioned to metformin  500 mg BID and Farxiga 10 mg daily at discharge. She will need her glucose checked at her PCP follow-up to see if additional diabetes medications are needed.   #Polysubstance use (cocaine, marijuana, tobacco) #SDOH Patient reported current tobacco (0.5 ppd), crack cocaine (twice weekly), and marijuana (3-4 times monthly) use on admission. She was extensively educated about how polysubstance use contributes to COPD, HFpEF, and hypertension. She demonstrated an understanding of the risks associated with smoking and substance use and expressed a commitment to making healthier choices moving forward. Patient also reported housing instability (lives in [place]) and limited finances, so TOC was consulted for medication assistance.

## 2023-12-05 NOTE — Telephone Encounter (Addendum)
 Patient Product/process development scientist completed.    The patient is insured through [employer] [place].     Ran test claim for Anoro Ellipta and the current 30 day co-pay is $4.00.  Ran test claim for Farxiga 10 mg and the current 30 day co-pay is $4.00.  Ran test claim for Jardiance 10 mg and Requires Prior Authorization  This test claim was processed through [employer]- copay amounts may vary at other pharmacies due to [employer], or as the patient moves through the different stages of their insurance plan.     Morgan Arab, CPHT Pharmacy Technician III Certified Patient Advocate [facility] Patient Advocate Team Direct Number: [phone]  Fax: [phone]

## 2023-12-05 NOTE — Discharge Instructions (Addendum)
 It was a pleasure taking care of you in the hospital! You were admitted for shortness of breath, which was likely caused by your COPD and heart failure. You were treated with inhalers and diuretics (water pills), which allowed you to breathe comfortably on room air by discharge. You were started on the medications below. Please try to take these medicines as prescribed and follow-up with your primary care provider at [facility] with Rheba Cedar, NP on 12/16/2023 @ 1:20PM, so these medicines can be adjusted as needed. - Furosemide  20 mg daily to help prevent fluid build up - Losartan  50 mg daily to lower your blood pressure - Carvedilol  6.25 mg daily - Farxiga 10 mg daily for to lower blood sugar  - Anora-Ellipta inhaler 1 puff daily to help your breathing - Metformin  500 mg twice daily to lower your blood sugar - Prednisone  40 mg daily on 6/17 and 6/18 for inflammation in your lungs

## 2023-12-05 NOTE — TOC Transition Note (Addendum)
 Transition of Care [facility]) - Discharge Note   Patient Details  Name: Julie [name] MRN: [id number] Date of Birth: 10-19-1975  Transition of Care [facility]) CM/SW Contact:  Cosimo Diones, RN Phone Number: 12/05/2023, 11:13 AM   Clinical Narrative: Case Manager spoke with patient regarding disposition needs. Patient has not had a visit with her PCP Gavin Kast. The last appointment was cancelled.  CMA to schedule an appointment and place the appointment on the AVS. Case Manager educated patient on the importance of PCP visits and compliancy. Case Manager discussed cost of medications and the cost of $4.00. Patient will go to the discharge lounge and will receive assistance with cab voucher. No further needs identified at this time.       CMA unable to schedule with Gavin Kast. New appointment has been placed on the AVS for hospital follow up and PCP needs. [facility] [address] [phone]. Patient received AVS from Charge RN.     Barriers to Discharge: No Barriers Identified  Patient Goals and CMS Choice Patient states their goals for this hospitalization and ongoing recovery are:: to return to hotel  Discharge Plan and Services Additional resources added to the After Visit Summary for   In-house Referral: Clinical Social Work  Social Drivers of Health (SDOH) Interventions SDOH Screenings   Food Insecurity: Food Insecurity Present (12/03/2023)  Housing: High Risk (12/03/2023)  Transportation Needs: Unmet Transportation Needs (12/03/2023)  Utilities: Not At Risk (12/03/2023)  Recent Concern: Utilities - At Risk (09/24/2023)  Alcohol  Screen: Low Risk  (03/18/2023)  Depression (PHQ2-9): Medium Risk (03/05/2019)  Tobacco Use: High Risk (12/03/2023)   Readmission Risk Interventions    09/27/2023   12:14 PM  Readmission Risk Prevention Plan  Transportation Screening Complete  PCP or Specialist Appt within 3-5 Days Complete  HRI or Home Care  Consult Complete  Social Work Consult for Recovery Care Planning/Counseling Complete  Palliative Care Screening Not Applicable  Medication Review Oceanographer) Complete

## 2023-12-05 NOTE — Progress Notes (Signed)
 Heart Failure Navigator Progress Note  Assessed for Heart & Vascular TOC clinic readiness.  Patient does not meet criteria due to EF 50-55%, has a scheduled PCP on 12/16/2023. No HF TOC. .   Navigator will sign off at this time.   Randie Bustle, BSN, Scientist, clinical (histocompatibility and immunogenetics) Only

## 2023-12-06 NOTE — Telephone Encounter (Addendum)
 Pt had new pt visit scheduled 11/01/2023 and was a same day cancellation & pt rescheduled for 12/16/2023. I preemptively dismissed in the event she cancelled a 2nd time. We remove the dismissal if pt comes for the new patient visit.  I called phone # listed for pt. This goes to her sister who said pt does not have a phone but she will give her msg to call back. Pt has appt already scheduled 12/16/2023. Ok to transfer to [facility] if pt needs to reschedule visit.

## 2023-12-16 ENCOUNTER — Ambulatory Visit: Payer: MEDICAID | Admitting: Nurse Practitioner

## 2023-12-29 ENCOUNTER — Other Ambulatory Visit: Payer: Self-pay

## 2023-12-29 ENCOUNTER — Encounter (HOSPITAL_COMMUNITY): Payer: Self-pay | Admitting: Emergency Medicine

## 2023-12-29 ENCOUNTER — Emergency Department (HOSPITAL_COMMUNITY)
Admission: EM | Admit: 2023-12-29 | Discharge: 2023-12-30 | Disposition: A | Discharge status: Home / Self Care | Payer: MEDICAID | Attending: Emergency Medicine | Admitting: Emergency Medicine

## 2023-12-29 ENCOUNTER — Emergency Department (HOSPITAL_COMMUNITY): Payer: MEDICAID

## 2023-12-29 DIAGNOSIS — Z7984 Long term (current) use of oral hypoglycemic drugs: Secondary | ICD-10-CM | POA: Diagnosis not present

## 2023-12-29 DIAGNOSIS — Z79899 Other long term (current) drug therapy: Secondary | ICD-10-CM | POA: Diagnosis not present

## 2023-12-29 DIAGNOSIS — M48061 Spinal stenosis, lumbar region without neurogenic claudication: Secondary | ICD-10-CM

## 2023-12-29 DIAGNOSIS — E119 Type 2 diabetes mellitus without complications: Secondary | ICD-10-CM | POA: Insufficient documentation

## 2023-12-29 DIAGNOSIS — R0602 Shortness of breath: Secondary | ICD-10-CM | POA: Diagnosis present

## 2023-12-29 DIAGNOSIS — J449 Chronic obstructive pulmonary disease, unspecified: Secondary | ICD-10-CM | POA: Insufficient documentation

## 2023-12-29 DIAGNOSIS — I5030 Unspecified diastolic (congestive) heart failure: Secondary | ICD-10-CM | POA: Diagnosis not present

## 2023-12-29 DIAGNOSIS — I110 Hypertensive heart disease with heart failure: Secondary | ICD-10-CM | POA: Diagnosis not present

## 2023-12-29 DIAGNOSIS — M545 Low back pain, unspecified: Secondary | ICD-10-CM

## 2023-12-29 DIAGNOSIS — M62830 Muscle spasm of back: Secondary | ICD-10-CM | POA: Diagnosis not present

## 2023-12-29 LAB — HEPATIC FUNCTION PANEL
ALT: 16 U/L (ref 0–44)
AST: 23 U/L (ref 15–41)
Albumin: 3.8 g/dL (ref 3.5–5.0)
Alkaline Phosphatase: 49 U/L (ref 38–126)
Bilirubin, Direct: 0.1 mg/dL (ref 0.0–0.2)
Indirect Bilirubin: 0.4 mg/dL (ref 0.3–0.9)
Total Bilirubin: 0.5 mg/dL (ref 0.0–1.2)
Total Protein: 7.1 g/dL (ref 6.5–8.1)

## 2023-12-29 LAB — BASIC METABOLIC PANEL WITH GFR
Anion gap: 10 (ref 5–15)
BUN: 9 mg/dL (ref 6–20)
CO2: 25 mmol/L (ref 22–32)
Calcium: 10.4 mg/dL — ABNORMAL HIGH (ref 8.9–10.3)
Chloride: 109 mmol/L (ref 98–111)
Creatinine, Ser: 0.86 mg/dL (ref 0.44–1.00)
GFR, Estimated: >60 mL/min (ref >60)
Glucose, Bld: 165 mg/dL — ABNORMAL HIGH (ref 70–99)
Potassium: 3.7 mmol/L (ref 3.5–5.1)
Sodium: 144 mmol/L (ref 135–145)

## 2023-12-29 LAB — CBC
HCT: 43.3 % (ref 36.0–46.0)
Hemoglobin: 13.5 g/dL (ref 12.0–15.0)
MCH: 26.7 pg (ref 26.0–34.0)
MCHC: 31.2 g/dL (ref 30.0–36.0)
MCV: 85.7 fL (ref 80.0–100.0)
Platelets: 216 K/uL (ref 150–400)
RBC: 5.05 MIL/uL (ref 3.87–5.11)
RDW: 14.1 % (ref 11.5–15.5)
WBC: 6.8 K/uL (ref 4.0–10.5)
nRBC: 0 % (ref 0.0–0.2)

## 2023-12-29 LAB — TROPONIN I (HIGH SENSITIVITY)
Troponin I (High Sensitivity): 33 ng/L — ABNORMAL HIGH (ref <18)
Troponin I (High Sensitivity): 34 ng/L — ABNORMAL HIGH (ref <18)

## 2023-12-29 LAB — BRAIN NATRIURETIC PEPTIDE: B Natriuretic Peptide: 379.7 pg/mL — ABNORMAL HIGH (ref 0.0–100.0)

## 2023-12-29 LAB — LIPASE, BLOOD: Lipase: 35 U/L (ref 11–51)

## 2023-12-29 LAB — HCG, SERUM, QUALITATIVE: Preg, Serum: NEGATIVE

## 2023-12-29 LAB — D-DIMER, QUANTITATIVE: D-Dimer, Quant: 0.41 ug{FEU}/mL (ref 0.00–0.50)

## 2023-12-29 MED ORDER — ACETAMINOPHEN 325 MG PO TABS
650.0000 mg | ORAL_TABLET | Freq: Once | ORAL | Status: AC
  Administered 2023-12-29: 650 mg via ORAL
  Filled 2023-12-29: qty 2

## 2023-12-29 MED ORDER — FUROSEMIDE 10 MG/ML IJ SOLN
20.0000 mg | Freq: Once | INTRAMUSCULAR | Status: AC
  Administered 2023-12-29: 20 mg via INTRAVENOUS
  Filled 2023-12-29: qty 2

## 2023-12-29 MED ORDER — METHOCARBAMOL 500 MG PO TABS
500.0000 mg | ORAL_TABLET | Freq: Once | ORAL | Status: AC
  Administered 2023-12-29: 500 mg via ORAL
  Filled 2023-12-29: qty 1

## 2023-12-29 NOTE — ED Triage Notes (Signed)
 Patient c/o chest pain and sob x 3 days.  Patient also states they fell 2 days ago and is c/o right lower back pain.  Patient denies hitting head, denies LOC.  Patient gives verbal consent for MSE.

## 2023-12-29 NOTE — ED Provider Notes (Signed)
 [place] EMERGENCY DEPARTMENT AT [facility] Provider Note   CSN: [id number] Arrival date & time: 12/29/23  2048   Patient presents with: Chest Pain, Shortness of Breath, and Back Pain   Julie [name] is a 48 y.o. female with past medical history of HTN, HFpEF, T2DM, polysubstance abuse, and COPD who presents to the ED for evaluation of 3 days of worsening shortness of breath and 2 days of lower back pain after a GLF.  Patient states that she often goes several days without taking her Lasix  due to concerns with increased urinary frequency while out in public, states last time she took Lasix  was Monday of this week.  Also states that she sometimes forgets to take her other medications including her carvedilol .  Denies any increased cough from her baseline recently, or recent fevers/chills. Patient also reports that she had a GLF 2 days ago after tripping up over a concrete curb outside.  Denies any head injury or LOC.  States that she has chronic issues with her knees buckling/giving out.  Endorses significant mid and lower back pain ever since the fall, however denies any urinary/bowel incontinence, groin paresthesias, or difficulty ambulating/recurrent falls.    Prior to Admission medications   Medication Sig Start Date End Date Taking? Authorizing Provider  acetaminophen  (TYLENOL ) 500 MG tablet Take 1,000 mg by mouth every 6 (six) hours as needed.    [provider]  albuterol  (VENTOLIN  HFA) 108 (90 Base) MCG/ACT inhaler Inhale 2 puffs into the lungs every 6 (six) hours as needed for wheezing or shortness of breath. 08/25/23   Dezii, Alexandra, DO  carvedilol  (COREG ) 6.25 MG tablet Take 1 tablet (6.25 mg total) by mouth 2 (two) times daily with a meal. 12/05/23   Renne Homans, MD  dapagliflozin  propanediol (FARXIGA ) 5 MG TABS tablet Take 1 tablet (5 mg total) by mouth daily before breakfast. 12/05/23   Renne Homans, MD  furosemide  (LASIX ) 20 MG tablet Take 1 tablet (20  mg total) by mouth daily. 12/06/23   Amoako, Prince, MD  losartan  (COZAAR ) 50 MG tablet Take 1 tablet (50 mg total) by mouth daily. 12/06/23   Amoako, Prince, MD  metFORMIN  (GLUCOPHAGE ) 500 MG tablet Take 1 tablet (500 mg total) by mouth 2 (two) times daily with a meal. 12/05/23   Renne Homans, MD  predniSONE  (DELTASONE ) 20 MG tablet Take 2 tablets (40 mg total) by mouth daily with breakfast. 12/06/23   Renne Homans, MD  umeclidinium-vilanterol (ANORO ELLIPTA ) 62.5-25 MCG/ACT AEPB Inhale 1 puff into the lungs daily. 12/06/23   Amoako, Prince, MD    Allergies: Patient has no known allergies.     Updated Vital Signs BP (!) 180/101   Pulse 86   Temp 98.2 F (36.8 C) (Oral)   Resp (!) 25   Wt 118 kg   SpO2 100%   BMI 37.33 kg/m   Physical Exam Vitals reviewed.  Constitutional:      General: She is not in acute distress.    Appearance: She is not toxic-appearing or diaphoretic.  HENT:     Head: Normocephalic and atraumatic.     Nose: Nose normal. No rhinorrhea.     Mouth/Throat:     Mouth: Mucous membranes are moist.     Pharynx: Oropharynx is clear.  Eyes:     Extraocular Movements: Extraocular movements intact.     Pupils: Pupils are equal, round, and reactive to light.  Cardiovascular:     Rate and Rhythm: Normal rate and  regular rhythm.     Heart sounds: No murmur heard.    No gallop.  Pulmonary:     Breath sounds: Rales (LLL) present. No wheezing.     Comments: Mild respiratory distress after speaking long sentences, otherwise effort grossly normal Chest:     Chest wall: No tenderness.  Abdominal:     General: Abdomen is protuberant.     Palpations: Abdomen is soft.     Tenderness: There is no abdominal tenderness. There is no guarding or rebound.  Musculoskeletal:        General: Tenderness present. Normal range of motion.     Cervical back: Normal range of motion and neck supple. No rigidity, tenderness or bony tenderness. No pain with movement.     Thoracic back:  Spasms and bony tenderness present. No deformity.     Lumbar back: Spasms, tenderness (right paraspinal) and bony tenderness present. No deformity. Negative right straight leg raise test and negative left straight leg raise test.     Right lower leg: No edema.     Left lower leg: No edema.  Skin:    General: Skin is warm and dry.     Capillary Refill: Capillary refill takes less than 2 seconds.     Coloration: Skin is not jaundiced.     Findings: No bruising or lesion.  Neurological:     General: No focal deficit present.     Mental Status: She is alert and oriented to person, place, and time.     Sensory: No sensory deficit.     Motor: No weakness.     (all labs ordered are listed, but only abnormal results are displayed) Labs Reviewed  BASIC METABOLIC PANEL WITH GFR - Abnormal; Notable for the following components:      Result Value   Glucose, Bld 165 (*)    Calcium  10.4 (*)    All other components within normal limits  BRAIN NATRIURETIC PEPTIDE - Abnormal; Notable for the following components:   B Natriuretic Peptide 379.7 (*)    All other components within normal limits  TROPONIN I (HIGH SENSITIVITY) - Abnormal; Notable for the following components:   Troponin I (High Sensitivity) 33 (*)    All other components within normal limits  TROPONIN I (HIGH SENSITIVITY) - Abnormal; Notable for the following components:   Troponin I (High Sensitivity) 34 (*)    All other components within normal limits  CBC  HCG, SERUM, QUALITATIVE  HEPATIC FUNCTION PANEL  LIPASE, BLOOD  D-DIMER, QUANTITATIVE    EKG: EKG Interpretation Date/Time:  Thursday December 29 2023 21:02:25 EDT Ventricular Rate:  104 PR Interval:  138 QRS Duration:  90 QT Interval:  360 QTC Calculation: 473 R Axis:   -53  Text Interpretation: Sinus tachycardia Left anterior fascicular block Septal infarct , age undetermined Abnormal ECG When compared with ECG of 03-Dec-2023 05:02, PREVIOUS ECG IS PRESENT Similar to  previous Confirmed by Bari Flank [phone]) on 12/29/2023 9:31:55 PM    Medications Ordered in the ED  methocarbamol  (ROBAXIN ) tablet 500 mg (500 mg Oral Given 12/29/23 2334)  furosemide  (LASIX ) injection 20 mg (20 mg Intravenous Given 12/29/23 2334)  acetaminophen  (TYLENOL ) tablet 650 mg (650 mg Oral Given 12/29/23 2334)    Clinical Course as of 12/30/23 0129  Thu Dec 29, 2023  2141 DG Chest 2 View No active cardiopulmonary disease. [AD]  2228 Troponin I (High Sensitivity)(!): 33 Near patient's baseline per recent encounters [AD]  Fri Dec 30, 2023  0129 B  Natriuretic Peptide(!): 379.7 [AD]  0129 CBC and CMP unremarkable [AD]    Clinical Course User Index [AD] Raoul Rake, MD [JB] Barrett, Warren SAILOR, PA-C    HEART Score: 4  Medical Decision Making Patient with the above history presenting with 3 days of progressive SOB/dyspnea and intermittent chest tightness, as well as 2 days of mid + lower back pain after a GLF. Patient has history of HF with recent exacerbation in the last month and admits to non-compliance with her home lasix  and other meds (including carvedilol ), and has not taken these meds for the last 3 days.  Most likely diagnosis is acute HF exacerbation in setting of medication non-compliance, though reassuringly patient is satting well on RA compared to her recent HF exacerbations with acute hypoxic respiratory failure requiring Bipap. Given her presence of squeezing chest discomfort, also considered ACS/MI. Will also get D-dimer in consideration of possible PE.  Regarding patient's back pain, most concerned for mid-lower back strain/paraspinal muscle spasms in setting of GLF. Patient is midline tender in a few spots down the T and L spine, so will get CT T/L spine to evaluate for possible fracture/dislocation, though reassuringly patient has been ambulatory since the fall with no major issues or recurrent falls, and has no focal neuro deficits concerning for acute spinal  cord injury or cauda equina.  Will give patient dose of robaxin /tylenol  for back pain and home dose of lasix . At time of handoff, patient's D-dimer was pending and subsequent CT T/L spine and consideration of need for CT PE.  Please see the following provider's note for further documentation of care in the ED.   Amount and/or Complexity of Data Reviewed Labs: ordered. Decision-making details documented in ED Course. Radiology: ordered. Decision-making details documented in ED Course. ECG/medicine tests: ordered.    Details: Interpretation as above  Risk OTC drugs. Prescription drug management.    Final diagnoses:  Shortness of breath  Acute bilateral low back pain without sciatica    ED Discharge Orders     None          Raoul Rake, MD 12/30/23 0139    Bari Roxie HERO, DO 01/07/24 1537

## 2023-12-30 ENCOUNTER — Emergency Department (HOSPITAL_COMMUNITY): Payer: MEDICAID

## 2023-12-30 MED ORDER — LIDOCAINE 5 % EX PTCH
1.0000 | MEDICATED_PATCH | CUTANEOUS | Status: DC
  Administered 2023-12-30: 1 via TRANSDERMAL
  Filled 2023-12-30: qty 1

## 2023-12-30 MED ORDER — LIDOCAINE 5 % EX PTCH: 1.0000 | MEDICATED_PATCH | CUTANEOUS | 0 refills | Status: DC

## 2023-12-30 MED ORDER — METHOCARBAMOL 500 MG PO TABS: 500.0000 mg | ORAL_TABLET | Freq: Two times a day (BID) | ORAL | 0 refills | Status: DC

## 2023-12-30 MED ORDER — OXYCODONE HCL 5 MG PO TABS: 5.0000 mg | ORAL_TABLET | Freq: Four times a day (QID) | ORAL | 0 refills | Status: DC | PRN

## 2023-12-30 MED ORDER — HYDROCODONE-ACETAMINOPHEN 5-325 MG PO TABS
1.0000 | ORAL_TABLET | Freq: Once | ORAL | Status: AC
  Administered 2023-12-30: 1 via ORAL
  Filled 2023-12-30: qty 1

## 2023-12-30 MED ORDER — ALBUTEROL SULFATE HFA 108 (90 BASE) MCG/ACT IN AERS: 2.0000 | INHALATION_SPRAY | Freq: Four times a day (QID) | RESPIRATORY_TRACT | 2 refills | Status: DC | PRN

## 2023-12-30 MED ORDER — NAPROXEN 500 MG PO TABS: 500.0000 mg | ORAL_TABLET | Freq: Two times a day (BID) | ORAL | 0 refills | Status: DC

## 2023-12-30 NOTE — ED Notes (Signed)
 Patient transported to CT

## 2023-12-30 NOTE — ED Provider Notes (Signed)
  Physical Exam  BP (!) 180/101   Pulse 86   Temp 98.2 F (36.8 C) (Oral)   Resp (!) 25   Wt 118 kg   SpO2 100%   BMI 37.33 kg/m   Physical Exam Cardiovascular:     Rate and Rhythm: Normal rate and regular rhythm.  Pulmonary:     Effort: Pulmonary effort is normal.     Breath sounds: Normal breath sounds.  Abdominal:     General: Abdomen is flat. Bowel sounds are normal.     Palpations: Abdomen is soft.  Musculoskeletal:     Right lower leg: No edema.     Left lower leg: No edema.     Procedures  Procedures  ED Course / MDM   Clinical Course as of 12/30/23 0138  Thu Dec 29, 2023  2141 DG Chest 2 View No active cardiopulmonary disease. [AD]  2228 Troponin I (High Sensitivity)(!): 33 Near patient's baseline per recent encounters [AD]  2313 Dispo pending repeat trop and dimer (+/- CTA chest). Will need CT T & L. If without acute findings will d/c with home lasix , has not been taking.   [JB]  Fri Dec 30, 2023  0129 B Natriuretic Peptide(!): 379.7 [AD]  0129 CBC and CMP unremarkable [AD]    Clinical Course User Index [AD] Raoul Rake, MD [JB] Hetal Proano, Warren SAILOR, PA-C   Medical Decision Making Amount and/or Complexity of Data Reviewed Labs: ordered. Decision-making details documented in ED Course. Radiology: ordered. Decision-making details documented in ED Course.  Risk OTC drugs. Prescription drug management.   Patient received in signout from prior ED provider.  In short patient is coming in with complaint of shortness of breath and low back pain. She has history of CHF.  She does have mild lower ext swelling on exam and no congestion on chest x-ray. She also had recent fall and now notes low back pain. Dispo pending CT thoracic and lumbar spine to rule out acute fracture.  Has not been taking Lasix .  Was given dose of Lasix  here. SOB resolved and amb w/ steady gait, no SOB.   CT thoracic and lumbar no acute fracute. Discussed L3-4, L4-5 spinal stenosis.    No red flag symptoms associated with back pain.  Stable for discharge.  Discussed symptomatic management and outpatient follow-up.       Shermon Warren SAILOR, PA-C 12/30/23 0153    Darra Fonda MATSU, MD 12/30/23 [phone]

## 2023-12-30 NOTE — ED Notes (Signed)
 Pt back from CT

## 2023-12-30 NOTE — ED Notes (Signed)
Pt discharged. Pt given discharge papers and papers explained. Pt in NAD at this time

## 2023-12-30 NOTE — Discharge Instructions (Addendum)
 Please make sure you are taking your medications as prescribed. Lasix  should help with shortness of breath.  I have sent an inhaler refill to your pharmacy.    I have also sent pain medicine for your low back -- Take naproxen  twice daily.  Take Tylenol  1000 mg every 8 hours. Take Robaxin  as needed for muscle spasm but this may cause some drowsiness so do not drive or operate heavy machinery with this medication. Use ice heat and lidocaine  patch over area of pain. Take oxycodone  for breakthrough pain.  Follow-up with primary care and return to ER with new or worsening symptoms.

## 2024-01-08 ENCOUNTER — Emergency Department (HOSPITAL_COMMUNITY)
Admission: EM | Admit: 2024-01-08 | Discharge: 2024-01-08 | Disposition: A | Discharge status: Home / Self Care | Payer: MEDICAID | Attending: Emergency Medicine | Admitting: Emergency Medicine

## 2024-01-08 ENCOUNTER — Encounter (HOSPITAL_COMMUNITY): Payer: Self-pay

## 2024-01-08 ENCOUNTER — Other Ambulatory Visit: Payer: Self-pay

## 2024-01-08 ENCOUNTER — Emergency Department (HOSPITAL_COMMUNITY): Payer: MEDICAID

## 2024-01-08 DIAGNOSIS — Z7984 Long term (current) use of oral hypoglycemic drugs: Secondary | ICD-10-CM | POA: Diagnosis not present

## 2024-01-08 DIAGNOSIS — I5023 Acute on chronic systolic (congestive) heart failure: Secondary | ICD-10-CM | POA: Insufficient documentation

## 2024-01-08 DIAGNOSIS — R0602 Shortness of breath: Secondary | ICD-10-CM | POA: Diagnosis present

## 2024-01-08 DIAGNOSIS — J4541 Moderate persistent asthma with (acute) exacerbation: Secondary | ICD-10-CM | POA: Diagnosis not present

## 2024-01-08 DIAGNOSIS — E119 Type 2 diabetes mellitus without complications: Secondary | ICD-10-CM | POA: Diagnosis not present

## 2024-01-08 LAB — BASIC METABOLIC PANEL WITH GFR
Anion gap: 10 (ref 5–15)
BUN: 10 mg/dL (ref 6–20)
CO2: 24 mmol/L (ref 22–32)
Calcium: 10.1 mg/dL (ref 8.9–10.3)
Chloride: 109 mmol/L (ref 98–111)
Creatinine, Ser: 0.92 mg/dL (ref 0.44–1.00)
GFR, Estimated: >60 mL/min (ref >60)
Glucose, Bld: 175 mg/dL — ABNORMAL HIGH (ref 70–99)
Potassium: 3.8 mmol/L (ref 3.5–5.1)
Sodium: 143 mmol/L (ref 135–145)

## 2024-01-08 LAB — CBC
HCT: 44.8 % (ref 36.0–46.0)
Hemoglobin: 13.7 g/dL (ref 12.0–15.0)
MCH: 26.6 pg (ref 26.0–34.0)
MCHC: 30.6 g/dL (ref 30.0–36.0)
MCV: 87 fL (ref 80.0–100.0)
Platelets: 181 K/uL (ref 150–400)
RBC: 5.15 MIL/uL — ABNORMAL HIGH (ref 3.87–5.11)
RDW: 14.1 % (ref 11.5–15.5)
WBC: 5.7 K/uL (ref 4.0–10.5)
nRBC: 0 % (ref 0.0–0.2)

## 2024-01-08 LAB — BRAIN NATRIURETIC PEPTIDE: B Natriuretic Peptide: 277.5 pg/mL — ABNORMAL HIGH (ref 0.0–100.0)

## 2024-01-08 LAB — HCG, SERUM, QUALITATIVE: Preg, Serum: NEGATIVE

## 2024-01-08 LAB — TROPONIN I (HIGH SENSITIVITY)
Troponin I (High Sensitivity): 33 ng/L — ABNORMAL HIGH (ref <18)
Troponin I (High Sensitivity): 30 ng/L — ABNORMAL HIGH (ref <18)

## 2024-01-08 MED ORDER — UMECLIDINIUM-VILANTEROL 62.5-25 MCG/ACT IN AEPB: 1.0000 | INHALATION_SPRAY | Freq: Every day | RESPIRATORY_TRACT | 0 refills | Status: DC

## 2024-01-08 MED ORDER — ALBUTEROL SULFATE HFA 108 (90 BASE) MCG/ACT IN AERS: 1.0000 | INHALATION_SPRAY | Freq: Four times a day (QID) | RESPIRATORY_TRACT | 0 refills | Status: DC | PRN

## 2024-01-08 MED ORDER — LOSARTAN POTASSIUM 50 MG PO TABS
50.0000 mg | ORAL_TABLET | Freq: Once | ORAL | Status: AC
  Administered 2024-01-08: 50 mg via ORAL
  Filled 2024-01-08: qty 1

## 2024-01-08 MED ORDER — FUROSEMIDE 10 MG/ML IJ SOLN
40.0000 mg | Freq: Once | INTRAMUSCULAR | Status: AC
  Administered 2024-01-08: 40 mg via INTRAVENOUS
  Filled 2024-01-08: qty 4

## 2024-01-08 MED ORDER — IPRATROPIUM-ALBUTEROL 0.5-2.5 (3) MG/3ML IN SOLN
3.0000 mL | Freq: Once | RESPIRATORY_TRACT | Status: AC
  Administered 2024-01-08: 3 mL via RESPIRATORY_TRACT
  Filled 2024-01-08: qty 3

## 2024-01-08 NOTE — ED Provider Notes (Signed)
 [place] EMERGENCY DEPARTMENT AT [facility] Provider Note   CSN: [id number] Arrival date & time: 01/08/24  9376     Patient presents with: Chest Pain and Shortness of Breath   Julie [name] is a 48 y.o. female who presents to the ED today with a 3-day history of shortness of breath that has been increasing as well as chest discomfort.  She describes her chest discomfort as a centralized pressure that is worsened along with walking or moderate physical activity.  She states that she has had a persistent cough as well, with production of clear to whitish sputum.  Denies having any fever, body aches, chills.  She has a previous medical history of asthma for which she is previously prescribed albuterol  and Anoro Ellipta , states that when she was being moved out of a motel room she believes this was lost so she has not taken any of these medications in the last 3 days.  She further states that she has had some intermittent nausea however has had no vomiting in the past several days.  Further complains of abdominal pain that only exist with coughing and states it feels like tight muscles in her abdomen.  No complaints of constipation or loose stools, no complaints of difficulty urinating.  Review of previous medical records does show that this patient was admitted for COPD exacerbation and acute CHF exacerbation on 14 June.  Patient was also seen in this ED on 11 July secondary to shortness of breath and lower back pain.  Was managed with IV diuretics at that time and was discharged to continue her home medications.  Previous medical diagnoses include CHF, asthma, and type 2 diabetes.  Review of previous echocardiogram obtained in March 2025 showed a ejection fraction of 50 to 55% with noted concentric hypertrophy of the left ventricle left atrial dilation.    Chest Pain Associated symptoms: abdominal pain, nausea and shortness of breath   Shortness of Breath Associated symptoms:  abdominal pain and chest pain        Prior to Admission medications   Medication Sig Start Date End Date Taking? Authorizing Provider  albuterol  (VENTOLIN  HFA) 108 (90 Base) MCG/ACT inhaler Inhale 1-2 puffs into the lungs every 6 (six) hours as needed for wheezing or shortness of breath. 01/08/24  Yes Julie Dorn BROCKS, PA  umeclidinium-vilanterol (ANORO ELLIPTA ) 62.5-25 MCG/ACT AEPB Inhale 1 puff into the lungs daily. 01/08/24  Yes Julie Dorn BROCKS, PA  acetaminophen  (TYLENOL ) 500 MG tablet Take 1,000 mg by mouth every 6 (six) hours as needed.    [provider]  albuterol  (VENTOLIN  HFA) 108 (90 Base) MCG/ACT inhaler Inhale 2 puffs into the lungs every 6 (six) hours as needed for wheezing or shortness of breath. 12/30/23   Barrett, Warren SAILOR, PA-C  carvedilol  (COREG ) 6.25 MG tablet Take 1 tablet (6.25 mg total) by mouth 2 (two) times daily with a meal. 12/05/23   Renne Homans, MD  dapagliflozin  propanediol (FARXIGA ) 5 MG TABS tablet Take 1 tablet (5 mg total) by mouth daily before breakfast. 12/05/23   Renne Homans, MD  furosemide  (LASIX ) 20 MG tablet Take 1 tablet (20 mg total) by mouth daily. 12/06/23   Amoako, Prince, MD  lidocaine  (LIDODERM ) 5 % Place 1 patch onto the skin daily. Remove & Discard patch within 12 hours or as directed by MD 12/30/23   Barrett, Warren SAILOR, PA-C  losartan  (COZAAR ) 50 MG tablet Take 1 tablet (50 mg total) by mouth daily. 12/06/23  Amoako, Prince, MD  metFORMIN  (GLUCOPHAGE ) 500 MG tablet Take 1 tablet (500 mg total) by mouth 2 (two) times daily with a meal. 12/05/23   Renne Homans, MD  methocarbamol  (ROBAXIN ) 500 MG tablet Take 1 tablet (500 mg total) by mouth 2 (two) times daily. 12/30/23   Barrett, Warren SAILOR, PA-C  naproxen  (NAPROSYN ) 500 MG tablet Take 1 tablet (500 mg total) by mouth 2 (two) times daily. 12/30/23   Barrett, Warren SAILOR, PA-C  oxyCODONE  (ROXICODONE ) 5 MG immediate release tablet Take 1 tablet (5 mg total) by mouth every 6 (six) hours as needed  for severe pain (pain score 7-10) or breakthrough pain. 12/30/23   Barrett, Warren SAILOR, PA-C  predniSONE  (DELTASONE ) 20 MG tablet Take 2 tablets (40 mg total) by mouth daily with breakfast. 12/06/23   Renne Homans, MD    Allergies: Patient has no known allergies.    Review of Systems  Respiratory:  Positive for shortness of breath.   Cardiovascular:  Positive for chest pain.  Gastrointestinal:  Positive for abdominal pain and nausea.  All other systems reviewed and are negative.   Updated Vital Signs BP (!) 160/105   Pulse 88   Temp 98.2 F (36.8 C) (Oral)   Resp (!) 21   Ht 5' 10 (1.778 m)   Wt 121.6 kg   LMP  (LMP Unknown)   SpO2 98%   BMI 38.45 kg/m   Physical Exam Vitals and nursing note reviewed.  Constitutional:      General: She is not in acute distress.    Appearance: Normal appearance.  HENT:     Head: Normocephalic and atraumatic.     Mouth/Throat:     Mouth: Mucous membranes are moist.     Pharynx: Oropharynx is clear.  Eyes:     Extraocular Movements: Extraocular movements intact.     Conjunctiva/sclera: Conjunctivae normal.     Pupils: Pupils are equal, round, and reactive to light.  Cardiovascular:     Rate and Rhythm: Normal rate and regular rhythm.     Pulses: Normal pulses.     Heart sounds: Normal heart sounds. No murmur heard.    No friction rub. No gallop.  Pulmonary:     Effort: Pulmonary effort is normal. Tachypnea present. No accessory muscle usage.     Breath sounds: Examination of the right-upper field reveals wheezing. Examination of the left-upper field reveals wheezing. Examination of the right-middle field reveals wheezing. Examination of the left-middle field reveals wheezing. Examination of the right-lower field reveals wheezing. Examination of the left-lower field reveals wheezing. Wheezing present.  Abdominal:     General: Abdomen is flat. Bowel sounds are normal.     Palpations: Abdomen is soft.     Tenderness: There is generalized  abdominal tenderness.     Comments: Tenderness to light palpation across the abdomen.  Musculoskeletal:        General: Normal range of motion.     Cervical back: Normal range of motion and neck supple.     Right lower leg: No edema.     Left lower leg: No edema.  Skin:    General: Skin is warm and dry.     Capillary Refill: Capillary refill takes less than 2 seconds.  Neurological:     General: No focal deficit present.     Mental Status: She is alert. Mental status is at baseline.  Psychiatric:        Mood and Affect: Mood normal.     (all  labs ordered are listed, but only abnormal results are displayed) Labs Reviewed  BASIC METABOLIC PANEL WITH GFR - Abnormal; Notable for the following components:      Result Value   Glucose, Bld 175 (*)    All other components within normal limits  CBC - Abnormal; Notable for the following components:   RBC 5.15 (*)    All other components within normal limits  TROPONIN I (HIGH SENSITIVITY) - Abnormal; Notable for the following components:   Troponin I (High Sensitivity) 33 (*)    All other components within normal limits  TROPONIN I (HIGH SENSITIVITY) - Abnormal; Notable for the following components:   Troponin I (High Sensitivity) 30 (*)    All other components within normal limits  HCG, SERUM, QUALITATIVE  BRAIN NATRIURETIC PEPTIDE    EKG: None  Radiology: DG Chest 2 View Result Date: 01/08/2024 CLINICAL DATA:  Chest pain EXAM: CHEST - 2 VIEW COMPARISON:  12/29/2023 FINDINGS: Stable cardiac enlargement. Mild thickening along the minor fissure in the right midlung. Increased interstitial markings with thickening of the peripheral septal lines identified. No superimposed consolidative change. Osseous structures appear intact. IMPRESSION: Mild CHF. Electronically Signed   By: Waddell Calk M.D.   On: 01/08/2024 07:11     Procedures   Medications Ordered in the ED  ipratropium-albuterol  (DUONEB) 0.5-2.5 (3) MG/3ML nebulizer  solution 3 mL (3 mLs Nebulization Given 01/08/24 0703)  furosemide  (LASIX ) injection 40 mg (40 mg Intravenous Given 01/08/24 0734)  losartan  (COZAAR ) tablet 50 mg (50 mg Oral Given 01/08/24 0734)    Clinical Course as of 01/08/24 1028  Sun Jan 08, 2024  0907 Reevaluated patient.  Lung sounds are greatly improved, no wheezes rales or rhonchi are noted at this time.  Plan to continue to monitor patient, at this time further plan for discharge with re-prescription of previous medications, follow-up with primary care [JG]  0909 Troponin I (High Sensitivity)(!): 33 Noted, this is stable compared to findings on previous lab draws within the last month. [JG]    Clinical Course User Index [JG] Julie Dorn BROCKS, PA                                 Medical Decision Making Amount and/or Complexity of Data Reviewed Labs: ordered. Decision-making details documented in ED Course. Radiology: ordered.  Risk Prescription drug management.   Medical Decision Making:   Julie [name] is a 48 y.o. female who presented to the ED today with dyspnea and chest discomfort detailed above.    External chart has been reviewed including previous labs and imaging as well as previous admission records. Patient's presentation is complicated by their history of COPD, HFpEF, polysubstance abuse.  Patient placed on continuous vitals and telemetry monitoring while in ED which was reviewed periodically.  Complete initial physical exam performed, notably the patient  was alert and oriented in no apparent distress.  Notable exam findings are of diffuse wheezing throughout all lung fields.  No peripheral edema is noted..    Reviewed and confirmed nursing documentation for past medical history, family history, social history.    Initial Assessment:   With the patient's presentation of shortness of breath and chest discomfort, differential diagnosis includes COPD exacerbation, acute on chronic CHF, pneumonia,  ACS.  Initial Plan:  Provide DuoNeb to manage shortness of breath and wheezing. Screening labs including CBC and Metabolic panel to evaluate for infectious or metabolic etiology of  disease.  Urinalysis with reflex culture ordered to evaluate for UTI or relevant urologic/nephrologic pathology.  CXR to evaluate for structural/infectious intrathoracic pathology.  EKG/Troponin testing/BNP testing to evaluate for cardiac pathology. Objective evaluation as below reviewed   Initial Study Results:   Laboratory  All laboratory results reviewed without evidence of clinically relevant pathology.   Exceptions include: Troponin is elevated to 30 however this is stable compared to previous findings.  No increase in troponins were noted on a 2-hour period.  EKG EKG was reviewed independently. Rate, rhythm, axis, intervals all examined and without medically relevant abnormality. ST segments without concerns for elevations.    Radiology:  All images reviewed independently. Agree with radiology report at this time.   DG Chest 2 View Result Date: 01/08/2024 CLINICAL DATA:  Chest pain EXAM: CHEST - 2 VIEW COMPARISON:  12/29/2023 FINDINGS: Stable cardiac enlargement. Mild thickening along the minor fissure in the right midlung. Increased interstitial markings with thickening of the peripheral septal lines identified. No superimposed consolidative change. Osseous structures appear intact. IMPRESSION: Mild CHF. Electronically Signed   By: Waddell Calk M.D.   On: 01/08/2024 07:11   CT Lumbar Spine Wo Contrast Result Date: 12/30/2023 CLINICAL DATA:  Fall 2 days ago, right lower back pain EXAM: CT LUMBAR SPINE WITHOUT CONTRAST TECHNIQUE: Multidetector CT imaging of the lumbar spine was performed without intravenous contrast administration. Multiplanar CT image reconstructions were also generated. RADIATION DOSE REDUCTION: This exam was performed according to the departmental dose-optimization program which includes  automated exposure control, adjustment of the mA and/or kV according to patient size and/or use of iterative reconstruction technique. COMPARISON:  CT abdomen pelvis 02/17/2023 FINDINGS: Segmentation: 5 lumbar type vertebrae. Alignment: No subluxation Vertebrae: No acute fracture or focal pathologic process. Paraspinal and other soft tissues: Negative Disc levels: Degenerative disc disease diffusely, most pronounced at L4-5 and L5-S1. Moderate to advanced degenerative facet disease diffusely, also most pronounced in the lower lumbar spine. Broad-based disc bulges at L3-4 and L4-5 along with facet arthropathy cause central spinal stenosis. IMPRESSION: No acute bony abnormality. Degenerative disc and facet disease. Broad-based disc bulges along with facet arthropathy causes mild to moderate central spinal stenosis at L3-4 and L4-5. Electronically Signed   By: Franky Crease M.D.   On: 12/30/2023 01:17   CT Thoracic Spine Wo Contrast Result Date: 12/30/2023 CLINICAL DATA:  Fall 2 days ago, right back pain EXAM: CT THORACIC SPINE WITHOUT CONTRAST TECHNIQUE: Multidetector CT images of the thoracic were obtained using the standard protocol without intravenous contrast. RADIATION DOSE REDUCTION: This exam was performed according to the departmental dose-optimization program which includes automated exposure control, adjustment of the mA and/or kV according to patient size and/or use of iterative reconstruction technique. COMPARISON:  None Available. FINDINGS: Alignment: Normal Vertebrae: No acute fracture or focal pathologic process. Paraspinal and other soft tissues: Negative Disc levels: Mild anterior and lateral spurring. IMPRESSION: No acute bony abnormality. Electronically Signed   By: Franky Crease M.D.   On: 12/30/2023 01:14   DG Chest 2 View Result Date: 12/29/2023 CLINICAL DATA:  Shortness of breath. EXAM: CHEST - 2 VIEW COMPARISON:  December 03, 2023. FINDINGS: Stable cardiomegaly. Both lungs are clear. The  visualized skeletal structures are unremarkable. IMPRESSION: No active cardiopulmonary disease. Electronically Signed   By: Lynwood Landy Raddle M.D.   On: 12/29/2023 21:31      Reassessment and Plan:   After administration of DuoNeb, reassess the patient's lung sounds and noted Rales throughout lung all lung fields.  At that time she was provided with 40 mg of IV Lasix  with frequent urination noted shortly thereafter.  An hour after administration of furosemide , patient's breathing significantly improved, and lung sounds significantly proved as well with good air entry in all fields and no adventitious sounds noted.  Is also noted the patient was hypertensive upon arrival to the emergency department.  In addition to the Lasix  that was provided she was also given her home dose of losartan , noted to also been taking carvedilol  however this was withheld due to concurrent beta agonist use for her breathing.  As patient has significantly improved, has remained stable, and lab findings and imaging are reassuring, plan is at this time to discharge patient with prescriptions to be given for her combination ICS/LABA as well as albuterol  as a rescue inhaler.  She states that she has the remainder of her medications, have advised patient that for the next 2 days to take 2 tablets of her 20 mg furosemide , then return to her regular dosing.  Further encouraged to follow-up with her primary care for continued monitoring of her condition.       Final diagnoses:  Acute on chronic systolic CHF (congestive heart failure) (HCC)  Moderate persistent asthma with exacerbation    ED Discharge Orders          Ordered    umeclidinium-vilanterol (ANORO ELLIPTA ) 62.5-25 MCG/ACT AEPB  Daily        01/08/24 1027    albuterol  (VENTOLIN  HFA) 108 (90 Base) MCG/ACT inhaler  Every 6 hours PRN        01/08/24 1027               Julie [name], [place] 01/08/24 1028    Towana Ozell BROCKS, MD 01/08/24 [phone]

## 2024-01-08 NOTE — ED Notes (Signed)
Pt declined COVID swab.

## 2024-01-08 NOTE — Discharge Instructions (Addendum)
 As we discussed, please continue to take 2 of your 20 mg Lasix  tablets for the next 2 days.  Then return to your normal dosing.  Please follow-up with your primary care physician for continued monitoring of your condition.

## 2024-01-08 NOTE — ED Notes (Signed)
 Patient transported to X-ray

## 2024-01-08 NOTE — ED Triage Notes (Signed)
 Pt c/o SHOB and CP x 3 days. Pt states she has not had her asthma medication in 3 days. Pt denies fevers, states she has been sweating frequently. Pt has had nausea, denies vomiting.

## 2024-01-24 ENCOUNTER — Emergency Department (HOSPITAL_COMMUNITY): Payer: MEDICAID

## 2024-01-24 ENCOUNTER — Emergency Department (HOSPITAL_COMMUNITY)
Admission: EM | Admit: 2024-01-24 | Discharge: 2024-01-25 | Disposition: A | Discharge status: Home / Self Care | Payer: MEDICAID | Attending: Emergency Medicine | Admitting: Emergency Medicine

## 2024-01-24 ENCOUNTER — Other Ambulatory Visit: Payer: Self-pay

## 2024-01-24 DIAGNOSIS — F17200 Nicotine dependence, unspecified, uncomplicated: Secondary | ICD-10-CM | POA: Diagnosis not present

## 2024-01-24 DIAGNOSIS — I110 Hypertensive heart disease with heart failure: Secondary | ICD-10-CM | POA: Diagnosis not present

## 2024-01-24 DIAGNOSIS — R0602 Shortness of breath: Secondary | ICD-10-CM | POA: Diagnosis present

## 2024-01-24 DIAGNOSIS — J441 Chronic obstructive pulmonary disease with (acute) exacerbation: Secondary | ICD-10-CM | POA: Insufficient documentation

## 2024-01-24 DIAGNOSIS — Z79899 Other long term (current) drug therapy: Secondary | ICD-10-CM | POA: Diagnosis not present

## 2024-01-24 DIAGNOSIS — E119 Type 2 diabetes mellitus without complications: Secondary | ICD-10-CM | POA: Insufficient documentation

## 2024-01-24 DIAGNOSIS — I5030 Unspecified diastolic (congestive) heart failure: Secondary | ICD-10-CM | POA: Insufficient documentation

## 2024-01-24 DIAGNOSIS — Z7984 Long term (current) use of oral hypoglycemic drugs: Secondary | ICD-10-CM | POA: Diagnosis not present

## 2024-01-24 DIAGNOSIS — J45909 Unspecified asthma, uncomplicated: Secondary | ICD-10-CM | POA: Diagnosis not present

## 2024-01-24 LAB — BASIC METABOLIC PANEL WITH GFR
Anion gap: 10 (ref 5–15)
BUN: 13 mg/dL (ref 6–20)
CO2: 25 mmol/L (ref 22–32)
Calcium: 10.1 mg/dL (ref 8.9–10.3)
Chloride: 108 mmol/L (ref 98–111)
Creatinine, Ser: 0.89 mg/dL (ref 0.44–1.00)
GFR, Estimated: >60 mL/min (ref >60)
Glucose, Bld: 192 mg/dL — ABNORMAL HIGH (ref 70–99)
Potassium: 3.8 mmol/L (ref 3.5–5.1)
Sodium: 143 mmol/L (ref 135–145)

## 2024-01-24 LAB — TROPONIN I (HIGH SENSITIVITY): Troponin I (High Sensitivity): 34 ng/L — ABNORMAL HIGH (ref <18)

## 2024-01-24 LAB — CBC
HCT: 40.5 % (ref 36.0–46.0)
Hemoglobin: 12.5 g/dL (ref 12.0–15.0)
MCH: 26.8 pg (ref 26.0–34.0)
MCHC: 30.9 g/dL (ref 30.0–36.0)
MCV: 86.7 fL (ref 80.0–100.0)
Platelets: 220 K/uL (ref 150–400)
RBC: 4.67 MIL/uL (ref 3.87–5.11)
RDW: 14.4 % (ref 11.5–15.5)
WBC: 5.8 K/uL (ref 4.0–10.5)
nRBC: 0 % (ref 0.0–0.2)

## 2024-01-24 LAB — HCG, SERUM, QUALITATIVE: Preg, Serum: NEGATIVE

## 2024-01-24 LAB — BRAIN NATRIURETIC PEPTIDE: B Natriuretic Peptide: 381.7 pg/mL — ABNORMAL HIGH (ref 0.0–100.0)

## 2024-01-24 MED ORDER — IPRATROPIUM-ALBUTEROL 0.5-2.5 (3) MG/3ML IN SOLN
3.0000 mL | Freq: Once | RESPIRATORY_TRACT | Status: AC
  Administered 2024-01-25: 3 mL via RESPIRATORY_TRACT
  Filled 2024-01-24: qty 3

## 2024-01-24 MED ORDER — DEXAMETHASONE 4 MG PO TABS
8.0000 mg | ORAL_TABLET | Freq: Once | ORAL | Status: AC
  Administered 2024-01-25: 8 mg via ORAL
  Filled 2024-01-24: qty 2

## 2024-01-24 NOTE — ED Notes (Signed)
 Pt is oxygenating at 99% and HR is 98. She is working to breath.

## 2024-01-24 NOTE — ED Triage Notes (Signed)
 Patient reports chest tightness/SOB with productive cough onset this week , no fever or chills .

## 2024-01-25 ENCOUNTER — Encounter (HOSPITAL_COMMUNITY): Payer: Self-pay

## 2024-01-25 LAB — TROPONIN I (HIGH SENSITIVITY): Troponin I (High Sensitivity): 34 ng/L — ABNORMAL HIGH (ref <18)

## 2024-01-25 MED ORDER — ALBUTEROL SULFATE HFA 108 (90 BASE) MCG/ACT IN AERS
2.0000 | INHALATION_SPRAY | Freq: Once | RESPIRATORY_TRACT | Status: AC
  Administered 2024-01-25: 2 via RESPIRATORY_TRACT
  Filled 2024-01-25: qty 6.7

## 2024-01-25 MED ORDER — ALBUTEROL SULFATE (2.5 MG/3ML) 0.083% IN NEBU
10.0000 mg/h | INHALATION_SOLUTION | Freq: Once | RESPIRATORY_TRACT | Status: AC
  Administered 2024-01-25: 10 mg/h via RESPIRATORY_TRACT
  Filled 2024-01-25: qty 12

## 2024-01-25 NOTE — ED Provider Notes (Signed)
 [place] EMERGENCY DEPARTMENT AT [facility] Provider Note   CSN: [id number] Arrival date & time: 01/24/24  2145     Patient presents with: Shortness of Breath (Chest Tightness)   Julie [name] is a 48 y.o. female.  {Add pertinent medical, surgical, social history, OB history to YEP:67052} The history is provided by the patient.  Patient w/history of asthma, diastolic CHF, hypertension, cocaine use disorder presents with shortness of breath Patient reports over the past 2-3 days she has been having increasing cough wheezing and shortness of breath.  She reports some chest tightness.  No hemoptysis.  She is still smoking.   Past Medical History:  Diagnosis Date   Asthma    Diabetes mellitus without complication (HCC)    Fracture, humerus closed, shaft 07/11/2014   left   History of bronchitis    Mental health problem    Open fracture of great toe of left foot 12/25/2015    Prior to Admission medications   Medication Sig Start Date End Date Taking? Authorizing Provider  acetaminophen  (TYLENOL ) 500 MG tablet Take 1,000 mg by mouth every 6 (six) hours as needed.    [provider]  albuterol  (VENTOLIN  HFA) 108 (90 Base) MCG/ACT inhaler Inhale 2 puffs into the lungs every 6 (six) hours as needed for wheezing or shortness of breath. 12/30/23   Barrett, Jamie N, PA-C  albuterol  (VENTOLIN  HFA) 108 (90 Base) MCG/ACT inhaler Inhale 1-2 puffs into the lungs every 6 (six) hours as needed for wheezing or shortness of breath. 01/08/24   Myriam Dorn BROCKS, PA  carvedilol  (COREG ) 6.25 MG tablet Take 1 tablet (6.25 mg total) by mouth 2 (two) times daily with a meal. 12/05/23   Renne Homans, MD  dapagliflozin  propanediol (FARXIGA ) 5 MG TABS tablet Take 1 tablet (5 mg total) by mouth daily before breakfast. 12/05/23   Renne Homans, MD  furosemide  (LASIX ) 20 MG tablet Take 1 tablet (20 mg total) by mouth daily. 12/06/23   Amoako, Prince, MD  lidocaine  (LIDODERM ) 5 % Place 1  patch onto the skin daily. Remove & Discard patch within 12 hours or as directed by MD 12/30/23   Barrett, Warren SAILOR, PA-C  losartan  (COZAAR ) 50 MG tablet Take 1 tablet (50 mg total) by mouth daily. 12/06/23   Amoako, Prince, MD  metFORMIN  (GLUCOPHAGE ) 500 MG tablet Take 1 tablet (500 mg total) by mouth 2 (two) times daily with a meal. 12/05/23   Renne Homans, MD  methocarbamol  (ROBAXIN ) 500 MG tablet Take 1 tablet (500 mg total) by mouth 2 (two) times daily. 12/30/23   Barrett, Warren SAILOR, PA-C  naproxen  (NAPROSYN ) 500 MG tablet Take 1 tablet (500 mg total) by mouth 2 (two) times daily. 12/30/23   Barrett, Warren SAILOR, PA-C  oxyCODONE  (ROXICODONE ) 5 MG immediate release tablet Take 1 tablet (5 mg total) by mouth every 6 (six) hours as needed for severe pain (pain score 7-10) or breakthrough pain. 12/30/23   Barrett, Jamie N, PA-C  predniSONE  (DELTASONE ) 20 MG tablet Take 2 tablets (40 mg total) by mouth daily with breakfast. 12/06/23   Renne Homans, MD  umeclidinium-vilanterol (ANORO ELLIPTA ) 62.5-25 MCG/ACT AEPB Inhale 1 puff into the lungs daily. 01/08/24   Myriam Dorn BROCKS, PA    Allergies: Patient has no known allergies.    Review of Systems  Constitutional:  Negative for fever.  Respiratory:  Positive for cough, shortness of breath and wheezing.   Cardiovascular:  Negative for leg swelling.    Updated  Vital Signs BP (!) 180/116   Pulse 100   Temp 98.5 F (36.9 C) (Oral)   Resp 20   LMP  (LMP Unknown)   SpO2 99%   Physical Exam CONSTITUTIONAL: Chronically ill-appearing, anxious HEAD: Normocephalic/atraumatic EYES: EOMI/PERRL ENMT: Mucous membranes moist NECK: supple no meningeal signs CV: S1/S2 noted, no murmurs/rubs/gallops noted LUNGS: Wheezing bilateral ABDOMEN: soft, nontender NEURO: Pt is awake/alert/appropriate, moves all extremitiesx4.  No facial droop.   EXTREMITIES: pulses normal/equal, full ROM No Significant lower extremity edema SKIN: warm, color normal PSYCH:  Anxious  (all labs ordered are listed, but only abnormal results are displayed) Labs Reviewed  BASIC METABOLIC PANEL WITH GFR - Abnormal; Notable for the following components:      Result Value   Glucose, Bld 192 (*)    All other components within normal limits  BRAIN NATRIURETIC PEPTIDE - Abnormal; Notable for the following components:   B Natriuretic Peptide 381.7 (*)    All other components within normal limits  TROPONIN I (HIGH SENSITIVITY) - Abnormal; Notable for the following components:   Troponin I (High Sensitivity) 34 (*)    All other components within normal limits  CBC  HCG, SERUM, QUALITATIVE  TROPONIN I (HIGH SENSITIVITY)    EKG: EKG Interpretation Date/Time:  Tuesday January 24 2024 22:14:35 EDT Ventricular Rate:  100 PR Interval:  128 QRS Duration:  92 QT Interval:  386 QTC Calculation: 497 R Axis:   -40  Text Interpretation: Normal sinus rhythm Biatrial enlargement Left axis deviation Pulmonary disease pattern Left ventricular hypertrophy with repolarization abnormality ( R in aVL , Cornell product ) Prolonged QT Abnormal ECG Confirmed by Midge Golas ([id number]) on 01/24/2024 11:46:22 PM  Radiology: [facility] Chest 2 View Result Date: 01/24/2024 CLINICAL DATA:  Chest tightness shortness breath for 2 days. EXAM: CHEST - 2 VIEW COMPARISON:  PA Lat chest 01/08/2024. FINDINGS: The heart is moderately enlarged. Central vessels are mildly prominent but there is no overt edema. Mild chronic elevation right hemidiaphragm. The lungs are clear of infiltrates. The sulci are sharp. The mediastinum is normally outlined. There is thoracic spondylosis. Thoracic cage intact. IMPRESSION: 1. Moderate cardiomegaly with mild central vascular prominence but no overt edema. 2. No evidence of pneumonia. Electronically Signed   By: Francis Quam M.D.   On: 01/24/2024 22:38    {Document cardiac monitor, telemetry assessment procedure when appropriate:32947} Procedures   Medications Ordered in  the ED  ipratropium-albuterol  (DUONEB) 0.5-2.5 (3) MG/3ML nebulizer solution 3 mL (has no administration in time range)  dexamethasone  (DECADRON ) tablet 8 mg (has no administration in time range)      {Click here for ABCD2, HEART and other calculators REFRESH Note before signing:1}                              Medical Decision Making Amount and/or Complexity of Data Reviewed Labs: ordered. Radiology: ordered.  Risk Prescription drug management.   This patient presents to the ED for concern of shortness of breath, this involves an extensive number of treatment options, and is a complaint that carries with it a high risk of complications and morbidity.  The differential diagnosis includes but is not limited to Acute coronary syndrome, pneumonia, acute pulmonary edema, pneumothorax, acute anemia, pulmonary embolism   Comorbidities that complicate the patient evaluation: Patient's presentation is complicated by their history of asthma, CHF  Social Determinants of Health: Patient's tobacco use and multiple ER visit  increases the complexity  of managing their presentation  Additional history obtained: Records reviewed previous admission documents  Lab Tests: I Ordered, and personally interpreted labs.  The pertinent results include: Hyperglycemia, chronically elevated troponin  Imaging Studies ordered: I ordered imaging studies including X-ray chest  I independently visualized and interpreted imaging which showed no signs of pneumonia or pulmonary edema I agree with the radiologist interpretation  Cardiac Monitoring: The patient was maintained on a cardiac monitor.  I personally viewed and interpreted the cardiac monitor which showed an underlying rhythm of:  sinus rhythm  Medicines ordered and prescription drug management: I ordered medication including nebulizers for shortness of breath Reevaluation of the patient after these medicines showed that the patient     {resolved/improved/worsened:23923::improved}  Test Considered: Patient is low risk / negative by ***, therefore do not feel that *** is indicated.  Critical Interventions:  ***  Consultations Obtained: I requested consultation with the {consultation:26851}, and discussed  findings as well as pertinent plan - they recommend: ***  Reevaluation: After the interventions noted above, I reevaluated the patient and found that they have :{resolved/improved/worsened:23923::improved}  Complexity of problems addressed: Patient's presentation is most consistent with  acute presentation with potential threat to life or bodily function  Disposition: After consideration of the diagnostic results and the patient's response to treatment,  I feel that the patent would benefit from {disposition:26850}.     {Document critical care time when appropriate  Document review of labs and clinical decision tools ie CHADS2VASC2, etc  Document your independent review of radiology images and any outside records  Document your discussion with family members, caretakers and with consultants  Document social determinants of health affecting pt's care  Document your decision making why or why not admission, treatments were needed:32947:::1}   Final diagnoses:  None    ED Discharge Orders     None

## 2024-01-25 NOTE — ED Notes (Signed)
 PT D/C'D AFTER INSTRUCTIONS REVIEWED. PT VERBALIZED UNDERSTANDING. NAD REPORTED OR NOTED AT THIS TIME.

## 2024-01-30 ENCOUNTER — Emergency Department (HOSPITAL_COMMUNITY)
Admission: EM | Admit: 2024-01-30 | Discharge: 2024-01-30 | Disposition: A | Discharge status: Home / Self Care | Payer: MEDICAID | Attending: Emergency Medicine | Admitting: Emergency Medicine

## 2024-01-30 ENCOUNTER — Encounter (HOSPITAL_COMMUNITY): Payer: Self-pay | Admitting: Emergency Medicine

## 2024-01-30 ENCOUNTER — Other Ambulatory Visit: Payer: Self-pay

## 2024-01-30 ENCOUNTER — Emergency Department (HOSPITAL_COMMUNITY): Payer: MEDICAID

## 2024-01-30 DIAGNOSIS — E119 Type 2 diabetes mellitus without complications: Secondary | ICD-10-CM | POA: Diagnosis not present

## 2024-01-30 DIAGNOSIS — Z7984 Long term (current) use of oral hypoglycemic drugs: Secondary | ICD-10-CM | POA: Diagnosis not present

## 2024-01-30 DIAGNOSIS — I509 Heart failure, unspecified: Secondary | ICD-10-CM | POA: Diagnosis not present

## 2024-01-30 DIAGNOSIS — R0602 Shortness of breath: Secondary | ICD-10-CM | POA: Diagnosis present

## 2024-01-30 LAB — CBC WITH DIFFERENTIAL/PLATELET
Abs Immature Granulocytes: 0.03 K/uL (ref 0.00–0.07)
Basophils Absolute: 0 K/uL (ref 0.0–0.1)
Basophils Relative: 1 %
Eosinophils Absolute: 0.1 K/uL (ref 0.0–0.5)
Eosinophils Relative: 1 %
HCT: 41.4 % (ref 36.0–46.0)
Hemoglobin: 13.1 g/dL (ref 12.0–15.0)
Immature Granulocytes: 0 %
Lymphocytes Relative: 28 %
Lymphs Abs: 2.4 K/uL (ref 0.7–4.0)
MCH: 27 pg (ref 26.0–34.0)
MCHC: 31.6 g/dL (ref 30.0–36.0)
MCV: 85.4 fL (ref 80.0–100.0)
Monocytes Absolute: 0.7 K/uL (ref 0.1–1.0)
Monocytes Relative: 8 %
Neutro Abs: 5.4 K/uL (ref 1.7–7.7)
Neutrophils Relative %: 62 %
Platelets: 218 K/uL (ref 150–400)
RBC: 4.85 MIL/uL (ref 3.87–5.11)
RDW: 14.6 % (ref 11.5–15.5)
WBC: 8.6 K/uL (ref 4.0–10.5)
nRBC: 0 % (ref 0.0–0.2)

## 2024-01-30 LAB — TROPONIN I (HIGH SENSITIVITY)
Troponin I (High Sensitivity): 37 ng/L — ABNORMAL HIGH (ref <18)
Troponin I (High Sensitivity): 28 ng/L — ABNORMAL HIGH (ref <18)

## 2024-01-30 LAB — COMPREHENSIVE METABOLIC PANEL WITH GFR
ALT: 36 U/L (ref 0–44)
AST: 28 U/L (ref 15–41)
Albumin: 3.5 g/dL (ref 3.5–5.0)
Alkaline Phosphatase: 54 U/L (ref 38–126)
Anion gap: 8 (ref 5–15)
BUN: 7 mg/dL (ref 6–20)
CO2: 24 mmol/L (ref 22–32)
Calcium: 10.1 mg/dL (ref 8.9–10.3)
Chloride: 108 mmol/L (ref 98–111)
Creatinine, Ser: 0.76 mg/dL (ref 0.44–1.00)
GFR, Estimated: >60 mL/min (ref >60)
Glucose, Bld: 245 mg/dL — ABNORMAL HIGH (ref 70–99)
Potassium: 4 mmol/L (ref 3.5–5.1)
Sodium: 140 mmol/L (ref 135–145)
Total Bilirubin: 0.6 mg/dL (ref 0.0–1.2)
Total Protein: 6.8 g/dL (ref 6.5–8.1)

## 2024-01-30 LAB — BRAIN NATRIURETIC PEPTIDE: B Natriuretic Peptide: 351.8 pg/mL — ABNORMAL HIGH (ref 0.0–100.0)

## 2024-01-30 MED ORDER — LOSARTAN POTASSIUM 50 MG PO TABS
50.0000 mg | ORAL_TABLET | Freq: Once | ORAL | Status: AC
  Administered 2024-01-30 (×2): 50 mg via ORAL
  Filled 2024-01-30: qty 1

## 2024-01-30 MED ORDER — FUROSEMIDE 10 MG/ML IJ SOLN
40.0000 mg | Freq: Once | INTRAMUSCULAR | Status: AC
  Administered 2024-01-30 (×2): 40 mg via INTRAVENOUS
  Filled 2024-01-30: qty 4

## 2024-01-30 NOTE — ED Triage Notes (Signed)
 Pt reports chest pressure, SOB and left foot pain for 2 days that worsened. Reports her foot feels like its burning. Pt on lasix  but not taking it as ordered.

## 2024-01-30 NOTE — Discharge Instructions (Signed)
 You are seen today for concerns of shortness of breath.  You appear to have had some slight fluid overload on your baseline labs.  You are given Lasix  which did help improve your work of breathing.  Please make sure that you are taking your diuretic at home as prescribed.  Also ensure you are taking your home medications such as your losartan  and other medications.  Return the emergency department for any concerns of new or worsening symptoms.

## 2024-01-30 NOTE — ED Provider Notes (Signed)
 [place] EMERGENCY DEPARTMENT AT [facility] Provider Note   CSN: [id number] Arrival date & time: 01/30/24  9276     Patient presents with: Shortness of Breath   Julie [name] is a 48 y.o. female.  Patient with history significant for CHF, uncontrolled type 2 diabetes, polysubstance abuse presents today with concerns of shortness of breath.  Reports she has had feelings of shortness of breath, chest pressure, and feelings of left foot swelling for the last 2 days.  States her last dose of Lasix  was likely 3 days ago.  She does admit that she does not take her Lasix  regularly and only takes it when she remembers to which ends up being only 1-2 times per week on average.  States that she does have some slight cough with a white phlegm.   Shortness of Breath      Prior to Admission medications   Medication Sig Start Date End Date Taking? Authorizing Provider  albuterol  (VENTOLIN  HFA) 108 (90 Base) MCG/ACT inhaler Inhale 2 puffs into the lungs every 6 (six) hours as needed for wheezing or shortness of breath. 12/30/23  Yes Barrett, Warren SAILOR, PA-C  carvedilol  (COREG ) 6.25 MG tablet Take 1 tablet (6.25 mg total) by mouth 2 (two) times daily with a meal. 12/05/23  Yes Renne Homans, MD  albuterol  (VENTOLIN  HFA) 108 (90 Base) MCG/ACT inhaler Inhale 1-2 puffs into the lungs every 6 (six) hours as needed for wheezing or shortness of breath. 01/08/24   Myriam Dorn BROCKS, PA  dapagliflozin  propanediol (FARXIGA ) 5 MG TABS tablet Take 1 tablet (5 mg total) by mouth daily before breakfast. 12/05/23   Renne Homans, MD  furosemide  (LASIX ) 20 MG tablet Take 1 tablet (20 mg total) by mouth daily. 12/06/23   Renne Homans, MD  lidocaine  (LIDODERM ) 5 % Place 1 patch onto the skin daily. Remove & Discard patch within 12 hours or as directed by MD Patient not taking: Reported on 01/30/2024 12/30/23   Barrett, Warren SAILOR, PA-C  losartan  (COZAAR ) 50 MG tablet Take 1 tablet (50 mg total) by mouth daily.  12/06/23   Amoako, Prince, MD  metFORMIN  (GLUCOPHAGE ) 500 MG tablet Take 1 tablet (500 mg total) by mouth 2 (two) times daily with a meal. 12/05/23   Renne Homans, MD  methocarbamol  (ROBAXIN ) 500 MG tablet Take 1 tablet (500 mg total) by mouth 2 (two) times daily. Patient not taking: Reported on 01/30/2024 12/30/23   Barrett, Warren SAILOR, PA-C  naproxen  (NAPROSYN ) 500 MG tablet Take 1 tablet (500 mg total) by mouth 2 (two) times daily. Patient not taking: Reported on 01/30/2024 12/30/23   Barrett, Warren SAILOR, PA-C  oxyCODONE  (ROXICODONE ) 5 MG immediate release tablet Take 1 tablet (5 mg total) by mouth every 6 (six) hours as needed for severe pain (pain score 7-10) or breakthrough pain. Patient not taking: Reported on 01/30/2024 12/30/23   Barrett, Warren SAILOR, PA-C  predniSONE  (DELTASONE ) 20 MG tablet Take 2 tablets (40 mg total) by mouth daily with breakfast. Patient not taking: Reported on 01/30/2024 12/06/23   Amoako, Prince, MD  umeclidinium-vilanterol (ANORO ELLIPTA ) 62.5-25 MCG/ACT AEPB Inhale 1 puff into the lungs daily. Patient not taking: Reported on 01/30/2024 01/08/24   Myriam Dorn BROCKS, PA    Allergies: Patient has no known allergies.    Review of Systems  Respiratory:  Positive for shortness of breath.   All other systems reviewed and are negative.   Updated Vital Signs BP (!) 157/85   Pulse 100  Temp 98.8 F (37.1 C) (Oral)   Resp (!) 23   Ht 5' 10 (1.778 m)   Wt 121 kg   LMP  (LMP Unknown) Comment: pt reports last period in July but unsure of exact date; states she has irregular periods  SpO2 96%   BMI 38.28 kg/m   Physical Exam Vitals and nursing note reviewed.  Constitutional:      General: She is not in acute distress.    Appearance: She is well-developed.  HENT:     Head: Normocephalic and atraumatic.  Eyes:     Conjunctiva/sclera: Conjunctivae normal.  Cardiovascular:     Rate and Rhythm: Normal rate and regular rhythm.     Heart sounds: No murmur heard. Pulmonary:      Effort: Pulmonary effort is normal. No respiratory distress.     Breath sounds: Normal breath sounds. No decreased breath sounds.  Chest:     Chest wall: No mass or tenderness.  Abdominal:     Palpations: Abdomen is soft.     Tenderness: There is no abdominal tenderness.  Musculoskeletal:        General: No swelling.     Cervical back: Neck supple.  Skin:    General: Skin is warm and dry.     Capillary Refill: Capillary refill takes less than 2 seconds.  Neurological:     Mental Status: She is alert.  Psychiatric:        Mood and Affect: Mood normal.     (all labs ordered are listed, but only abnormal results are displayed) Labs Reviewed  COMPREHENSIVE METABOLIC PANEL WITH GFR - Abnormal; Notable for the following components:      Result Value   Glucose, Bld 245 (*)    All other components within normal limits  BRAIN NATRIURETIC PEPTIDE - Abnormal; Notable for the following components:   B Natriuretic Peptide 351.8 (*)    All other components within normal limits  TROPONIN I (HIGH SENSITIVITY) - Abnormal; Notable for the following components:   Troponin I (High Sensitivity) 37 (*)    All other components within normal limits  TROPONIN I (HIGH SENSITIVITY) - Abnormal; Notable for the following components:   Troponin I (High Sensitivity) 28 (*)    All other components within normal limits  CBC WITH DIFFERENTIAL/PLATELET    EKG: EKG Interpretation Date/Time:  Monday January 30 2024 07:33:01 EDT Ventricular Rate:  104 PR Interval:  129 QRS Duration:  89 QT Interval:  378 QTC Calculation: 498 R Axis:   -19  Text Interpretation: Sinus tachycardia Right atrial enlargement LVH with secondary repolarization abnormality Anterior Q waves, possibly due to LVH Baseline wander in lead(s) V1 Confirmed by Pamella Sharper [phone]) on 01/30/2024 9:12:49 AM  Radiology: [facility] Chest 2 View Result Date: 01/30/2024 EXAM: 2 VIEW(S) XRAY OF THE CHEST 01/30/2024 07:59:00 AM COMPARISON: PA and  lateral radiographs of the chest dated 02/03/2024. CLINICAL HISTORY: Shortness of breath. Reason for exam: shortness of breath ; Triage notes: Pt reports chest pressure, SOB and left foot pain for 2 days that worsened. Reports her foot feels like its burning. Pt on lasix  but not taking it as ordered. FINDINGS: LUNGS AND PLEURA: No focal pulmonary opacity. No pulmonary edema. No pleural effusion. No pneumothorax. HEART AND MEDIASTINUM: The heart is normal in size. No acute abnormality of the cardiac and mediastinal silhouettes. BONES AND SOFT TISSUES: No acute osseous abnormality. There is mild elevation of the right hemidiaphragm, as before. The study is limited by large body  habitus. IMPRESSION: 1. No acute cardiopulmonary pathology. 2. Mild elevation of the right hemidiaphragm, as before. 3. Study limited by large body habitus. Electronically signed by: evalene coho 01/30/2024 08:37 AM EDT RP Workstation: [id number]     Procedures   Medications Ordered in the ED  furosemide  (LASIX ) injection 40 mg (40 mg Intravenous Given 01/30/24 0905)  losartan  (COZAAR ) tablet 50 mg (50 mg Oral Given 01/30/24 0905)                                    Medical Decision Making Amount and/or Complexity of Data Reviewed Labs: ordered. Radiology: ordered.  Risk Prescription drug management.   This patient presents to the ED for concern of shortness of breath, this involves an extensive number of treatment options, and is a complaint that carries with it a high risk of complications and morbidity.  The differential diagnosis includes ACS, PE, pneumonia, bronchitis, CHF   Co morbidities that complicate the patient evaluation  Type 2 diabetes, CHF   Lab Tests:  I Ordered, and personally interpreted labs.  The pertinent results include: CBC unremarkable, CMP with mild hyperglycemia 245, BNP elevated at 351.8, troponin elevated at 37 downtrending to 28 after Lasix  and losartan  administration   Imaging  Studies ordered:  I ordered imaging studies including chest x-ray I independently visualized and interpreted imaging which showed no acute cardiopulmonary pathology I agree with the radiologist interpretation   Cardiac Monitoring: / EKG:  The patient was maintained on a cardiac monitor.  I personally viewed and interpreted the cardiac monitored which showed an underlying rhythm of: Sinus tachycardia   Consultations Obtained:  I requested consultation with none,  and discussed lab and imaging findings as well as pertinent plan - they recommend: N/A   Problem List / ED Course / Critical interventions / Medication management  Patient with past history significant for type 2 diabetes, CHF presents to the ED with concerns of shortness of breath.  Reports the symptoms have been worsening the last 2 days with some associated left leg swelling.  She reports that she takes Lasix  but only when she remembers to wear signs of being on once to twice per week.  Denies any recent fever, chills or bodyaches.  No sick contacts.  She states that she has some phlegm production that is white in color.  Also endorsing some mild chest pressure. On exam, patient is well-appearing but does have slight tachypnea but no focal abnormal heart sounds.  Bilateral legs appear to have some mild edema present but no pitting edema seen.  There is no redness or skin induration to suggest cellulitis.  No calf tenderness.  Bilateral legs appear to be equally swollen.  Primary concern at this time for likely CHF and will start on cardiac workup but also obtain chest x-ray for evaluation of possible edema versus reactive airways secondary to infectious process. Lab workup is largely reassuring with troponin remaining consistent with baseline.  BNP slightly elevated but consistent with prior baseline.  Will readminister dose of Lasix  given patient reports that she has not been taking this medication consistently.  Also gave patient  dose of her home losartan .  I suspect majority of her symptoms are likely secondary to medication noncompliance.  Low concern for ACS at this time. On reevaluation, patient had improvement in symptoms after prescription of Lasix  and losartan . Instructed patient to ensure that she is taking her medication consistently  or else she will likely continue to have continued recurrence of these symptoms as seen recently. Patient verbalized understanding and will try to take medications more consistently. No other focal concerns at this time. Discharged home in stable condition. I ordered medication including Lasix , losartan   for CHF, hypertension  Reevaluation of the patient after these medicines showed that the patient improved I have reviewed the patients home medicines and have made adjustments as needed   Social Determinants of Health:  Housing instability   Test / Admission - Considered:  Admission considered but patient stable for outpatient follow up.  Final diagnoses:  Acute on chronic congestive heart failure, unspecified heart failure type [facility])    ED Discharge Orders     None          Cecily Legrand LABOR, PA-C 01/31/24 0821    Pamella Ozell LABOR, DO 02/08/24 [phone]

## 2024-01-30 NOTE — ED Notes (Signed)
 Patient transported to X-ray

## 2024-02-08 ENCOUNTER — Other Ambulatory Visit: Payer: Self-pay

## 2024-02-08 ENCOUNTER — Encounter (HOSPITAL_COMMUNITY): Payer: Self-pay

## 2024-02-08 ENCOUNTER — Observation Stay (HOSPITAL_COMMUNITY): Payer: MEDICAID

## 2024-02-08 ENCOUNTER — Inpatient Hospital Stay (HOSPITAL_COMMUNITY)
Admission: EM | Admit: 2024-02-08 | Discharge: 2024-02-11 | DRG: 291 | Disposition: A | Discharge status: Home / Self Care | Payer: MEDICAID | Attending: Family Medicine | Admitting: Family Medicine

## 2024-02-08 ENCOUNTER — Emergency Department (HOSPITAL_COMMUNITY): Payer: MEDICAID

## 2024-02-08 DIAGNOSIS — J9601 Acute respiratory failure with hypoxia: Secondary | ICD-10-CM | POA: Diagnosis not present

## 2024-02-08 DIAGNOSIS — Z5901 Sheltered homelessness: Secondary | ICD-10-CM

## 2024-02-08 DIAGNOSIS — J810 Acute pulmonary edema: Secondary | ICD-10-CM | POA: Diagnosis present

## 2024-02-08 DIAGNOSIS — Z79899 Other long term (current) drug therapy: Secondary | ICD-10-CM

## 2024-02-08 DIAGNOSIS — I110 Hypertensive heart disease with heart failure: Principal | ICD-10-CM | POA: Diagnosis present

## 2024-02-08 DIAGNOSIS — J441 Chronic obstructive pulmonary disease with (acute) exacerbation: Secondary | ICD-10-CM | POA: Diagnosis present

## 2024-02-08 DIAGNOSIS — R7989 Other specified abnormal findings of blood chemistry: Secondary | ICD-10-CM | POA: Diagnosis present

## 2024-02-08 DIAGNOSIS — I509 Heart failure, unspecified: Secondary | ICD-10-CM

## 2024-02-08 DIAGNOSIS — Z87891 Personal history of nicotine dependence: Secondary | ICD-10-CM

## 2024-02-08 DIAGNOSIS — Z91148 Patient's other noncompliance with medication regimen for other reason: Secondary | ICD-10-CM

## 2024-02-08 DIAGNOSIS — J45901 Unspecified asthma with (acute) exacerbation: Secondary | ICD-10-CM | POA: Diagnosis present

## 2024-02-08 DIAGNOSIS — I5033 Acute on chronic diastolic (congestive) heart failure: Secondary | ICD-10-CM | POA: Diagnosis present

## 2024-02-08 DIAGNOSIS — R000 Tachycardia, unspecified: Secondary | ICD-10-CM | POA: Diagnosis present

## 2024-02-08 DIAGNOSIS — Z6838 Body mass index (BMI) 38.0-38.9, adult: Secondary | ICD-10-CM

## 2024-02-08 DIAGNOSIS — R519 Headache, unspecified: Secondary | ICD-10-CM | POA: Diagnosis present

## 2024-02-08 DIAGNOSIS — I160 Hypertensive urgency: Secondary | ICD-10-CM | POA: Diagnosis present

## 2024-02-08 DIAGNOSIS — E1165 Type 2 diabetes mellitus with hyperglycemia: Secondary | ICD-10-CM | POA: Diagnosis present

## 2024-02-08 DIAGNOSIS — F1420 Cocaine dependence, uncomplicated: Secondary | ICD-10-CM | POA: Diagnosis present

## 2024-02-08 DIAGNOSIS — Z7984 Long term (current) use of oral hypoglycemic drugs: Secondary | ICD-10-CM

## 2024-02-08 DIAGNOSIS — Z1152 Encounter for screening for COVID-19: Secondary | ICD-10-CM

## 2024-02-08 LAB — COMPREHENSIVE METABOLIC PANEL WITH GFR
ALT: 68 U/L — ABNORMAL HIGH (ref 0–44)
AST: 52 U/L — ABNORMAL HIGH (ref 15–41)
Albumin: 3.5 g/dL (ref 3.5–5.0)
Alkaline Phosphatase: 55 U/L (ref 38–126)
Anion gap: 9 (ref 5–15)
BUN: 6 mg/dL (ref 6–20)
CO2: 25 mmol/L (ref 22–32)
Calcium: 9.9 mg/dL (ref 8.9–10.3)
Chloride: 110 mmol/L (ref 98–111)
Creatinine, Ser: 0.77 mg/dL (ref 0.44–1.00)
GFR, Estimated: >60 mL/min (ref >60)
Glucose, Bld: 214 mg/dL — ABNORMAL HIGH (ref 70–99)
Potassium: 4.3 mmol/L (ref 3.5–5.1)
Sodium: 144 mmol/L (ref 135–145)
Total Bilirubin: 0.9 mg/dL (ref 0.0–1.2)
Total Protein: 7 g/dL (ref 6.5–8.1)

## 2024-02-08 LAB — CBC WITH DIFFERENTIAL/PLATELET
Abs Immature Granulocytes: 0.03 K/uL (ref 0.00–0.07)
Abs Immature Granulocytes: 0.03 K/uL (ref 0.00–0.07)
Basophils Absolute: 0.1 K/uL (ref 0.0–0.1)
Basophils Absolute: 0 K/uL (ref 0.0–0.1)
Basophils Relative: 0 %
Basophils Relative: 0 %
Eosinophils Absolute: 0 K/uL (ref 0.0–0.5)
Eosinophils Absolute: 0 K/uL (ref 0.0–0.5)
Eosinophils Relative: 0 %
Eosinophils Relative: 0 %
HCT: 41.7 % (ref 36.0–46.0)
HCT: 41 % (ref 36.0–46.0)
Hemoglobin: 12.8 g/dL (ref 12.0–15.0)
Hemoglobin: 12.6 g/dL (ref 12.0–15.0)
Immature Granulocytes: 0 %
Immature Granulocytes: 0 %
Lymphocytes Relative: 21 %
Lymphocytes Relative: 27 %
Lymphs Abs: 2.3 K/uL (ref 0.7–4.0)
Lymphs Abs: 2.4 K/uL (ref 0.7–4.0)
MCH: 26.6 pg (ref 26.0–34.0)
MCH: 26.5 pg (ref 26.0–34.0)
MCHC: 30.7 g/dL (ref 30.0–36.0)
MCHC: 30.7 g/dL (ref 30.0–36.0)
MCV: 86.5 fL (ref 80.0–100.0)
MCV: 86.3 fL (ref 80.0–100.0)
Monocytes Absolute: 0.6 K/uL (ref 0.1–1.0)
Monocytes Absolute: 0.6 K/uL (ref 0.1–1.0)
Monocytes Relative: 6 %
Monocytes Relative: 7 %
Neutro Abs: 8.2 K/uL — ABNORMAL HIGH (ref 1.7–7.7)
Neutro Abs: 6 K/uL (ref 1.7–7.7)
Neutrophils Relative %: 73 %
Neutrophils Relative %: 66 %
Platelets: 239 K/uL (ref 150–400)
Platelets: 230 K/uL (ref 150–400)
RBC: 4.82 MIL/uL (ref 3.87–5.11)
RBC: 4.75 MIL/uL (ref 3.87–5.11)
RDW: 14.9 % (ref 11.5–15.5)
RDW: 15 % (ref 11.5–15.5)
WBC: 11.3 K/uL — ABNORMAL HIGH (ref 4.0–10.5)
WBC: 9.2 K/uL (ref 4.0–10.5)
nRBC: 0 % (ref 0.0–0.2)
nRBC: 0 % (ref 0.0–0.2)

## 2024-02-08 LAB — BASIC METABOLIC PANEL WITH GFR
Anion gap: 8 (ref 5–15)
BUN: 6 mg/dL (ref 6–20)
CO2: 24 mmol/L (ref 22–32)
Calcium: 10.1 mg/dL (ref 8.9–10.3)
Chloride: 112 mmol/L — ABNORMAL HIGH (ref 98–111)
Creatinine, Ser: 0.81 mg/dL (ref 0.44–1.00)
GFR, Estimated: >60 mL/min (ref >60)
Glucose, Bld: 178 mg/dL — ABNORMAL HIGH (ref 70–99)
Potassium: 4.3 mmol/L (ref 3.5–5.1)
Sodium: 144 mmol/L (ref 135–145)

## 2024-02-08 LAB — GLUCOSE, CAPILLARY
Glucose-Capillary: 372 mg/dL — ABNORMAL HIGH (ref 70–99)
Glucose-Capillary: 195 mg/dL — ABNORMAL HIGH (ref 70–99)

## 2024-02-08 LAB — HEPATIC FUNCTION PANEL
ALT: 61 U/L — ABNORMAL HIGH (ref 0–44)
AST: 47 U/L — ABNORMAL HIGH (ref 15–41)
Albumin: 3.5 g/dL (ref 3.5–5.0)
Alkaline Phosphatase: 55 U/L (ref 38–126)
Bilirubin, Direct: 0.2 mg/dL (ref 0.0–0.2)
Indirect Bilirubin: 0.8 mg/dL (ref 0.3–0.9)
Total Bilirubin: 1 mg/dL (ref 0.0–1.2)
Total Protein: 6.5 g/dL (ref 6.5–8.1)

## 2024-02-08 LAB — TROPONIN I (HIGH SENSITIVITY)
Troponin I (High Sensitivity): 45 ng/L — ABNORMAL HIGH (ref <18)
Troponin I (High Sensitivity): 46 ng/L — ABNORMAL HIGH (ref <18)
Troponin I (High Sensitivity): 46 ng/L — ABNORMAL HIGH (ref <18)

## 2024-02-08 LAB — BRAIN NATRIURETIC PEPTIDE: B Natriuretic Peptide: 445.4 pg/mL — ABNORMAL HIGH (ref 0.0–100.0)

## 2024-02-08 LAB — RESP PANEL BY RT-PCR (RSV, FLU A&B, COVID)  RVPGX2
Influenza A by PCR: NEGATIVE
Influenza B by PCR: NEGATIVE
Resp Syncytial Virus by PCR: NEGATIVE
SARS Coronavirus 2 by RT PCR: NEGATIVE

## 2024-02-08 LAB — CBG MONITORING, ED
Glucose-Capillary: 176 mg/dL — ABNORMAL HIGH (ref 70–99)
Glucose-Capillary: 216 mg/dL — ABNORMAL HIGH (ref 70–99)

## 2024-02-08 LAB — TSH: TSH: 3.309 u[IU]/mL (ref 0.350–4.500)

## 2024-02-08 MED ORDER — METHOCARBAMOL 500 MG PO TABS
500.0000 mg | ORAL_TABLET | Freq: Four times a day (QID) | ORAL | Status: DC | PRN
  Administered 2024-02-08 – 2024-02-11 (×7): 500 mg via ORAL
  Filled 2024-02-08 (×7): qty 1

## 2024-02-08 MED ORDER — HYDRALAZINE HCL 20 MG/ML IJ SOLN
5.0000 mg | INTRAMUSCULAR | Status: DC | PRN
  Administered 2024-02-08 – 2024-02-10 (×4): 5 mg via INTRAVENOUS
  Filled 2024-02-08 (×4): qty 1

## 2024-02-08 MED ORDER — NITROGLYCERIN 2 % TD OINT
1.0000 [in_us] | TOPICAL_OINTMENT | Freq: Once | TRANSDERMAL | Status: AC
  Administered 2024-02-08: 1 [in_us] via TOPICAL
  Filled 2024-02-08: qty 1

## 2024-02-08 MED ORDER — INSULIN ASPART 100 UNIT/ML IJ SOLN
0.0000 [IU] | Freq: Three times a day (TID) | INTRAMUSCULAR | Status: DC
  Administered 2024-02-08: 9 [IU] via SUBCUTANEOUS
  Administered 2024-02-08: 2 [IU] via SUBCUTANEOUS
  Administered 2024-02-08: 3 [IU] via SUBCUTANEOUS
  Administered 2024-02-09: 5 [IU] via SUBCUTANEOUS

## 2024-02-08 MED ORDER — ALBUTEROL SULFATE (2.5 MG/3ML) 0.083% IN NEBU: 2.5000 mg | INHALATION_SOLUTION | RESPIRATORY_TRACT | Status: DC | PRN

## 2024-02-08 MED ORDER — HYDRALAZINE HCL 20 MG/ML IJ SOLN: 5.0000 mg | INTRAMUSCULAR | Status: DC | PRN

## 2024-02-08 MED ORDER — ACETAMINOPHEN 650 MG RE SUPP: 650.0000 mg | Freq: Four times a day (QID) | RECTAL | Status: DC | PRN

## 2024-02-08 MED ORDER — PREDNISONE 20 MG PO TABS
40.0000 mg | ORAL_TABLET | Freq: Every day | ORAL | Status: DC
  Administered 2024-02-09: 40 mg via ORAL
  Filled 2024-02-08: qty 2

## 2024-02-08 MED ORDER — FUROSEMIDE 10 MG/ML IJ SOLN
40.0000 mg | Freq: Two times a day (BID) | INTRAMUSCULAR | Status: DC
  Administered 2024-02-09 – 2024-02-10 (×4): 40 mg via INTRAVENOUS
  Filled 2024-02-08 (×5): qty 4

## 2024-02-08 MED ORDER — ENOXAPARIN SODIUM 60 MG/0.6ML IJ SOSY
60.0000 mg | PREFILLED_SYRINGE | INTRAMUSCULAR | Status: DC
  Administered 2024-02-08 – 2024-02-10 (×3): 60 mg via SUBCUTANEOUS
  Filled 2024-02-08 (×4): qty 0.6

## 2024-02-08 MED ORDER — IPRATROPIUM-ALBUTEROL 0.5-2.5 (3) MG/3ML IN SOLN
3.0000 mL | Freq: Four times a day (QID) | RESPIRATORY_TRACT | Status: DC
  Administered 2024-02-08 – 2024-02-09 (×3): 3 mL via RESPIRATORY_TRACT
  Filled 2024-02-08 (×3): qty 3

## 2024-02-08 MED ORDER — ORAL CARE MOUTH RINSE: 15.0000 mL | OROMUCOSAL | Status: DC | PRN

## 2024-02-08 MED ORDER — UMECLIDINIUM-VILANTEROL 62.5-25 MCG/ACT IN AEPB: 1.0000 | INHALATION_SPRAY | Freq: Every day | RESPIRATORY_TRACT | Status: DC

## 2024-02-08 MED ORDER — POTASSIUM CHLORIDE CRYS ER 20 MEQ PO TBCR
40.0000 meq | EXTENDED_RELEASE_TABLET | Freq: Once | ORAL | Status: AC
  Administered 2024-02-08: 40 meq via ORAL
  Filled 2024-02-08: qty 2

## 2024-02-08 MED ORDER — METHYLPREDNISOLONE SODIUM SUCC 125 MG IJ SOLR
80.0000 mg | Freq: Every day | INTRAMUSCULAR | Status: AC
  Administered 2024-02-08: 80 mg via INTRAVENOUS
  Filled 2024-02-08: qty 2

## 2024-02-08 MED ORDER — ACETAMINOPHEN 325 MG PO TABS
650.0000 mg | ORAL_TABLET | Freq: Four times a day (QID) | ORAL | Status: DC | PRN
Start: 2024-02-08 — End: 2024-02-11
  Administered 2024-02-08 – 2024-02-11 (×5): 650 mg via ORAL
  Filled 2024-02-08 (×5): qty 2

## 2024-02-08 MED ORDER — FUROSEMIDE 10 MG/ML IJ SOLN
40.0000 mg | Freq: Once | INTRAMUSCULAR | Status: AC
  Administered 2024-02-08: 40 mg via INTRAVENOUS
  Filled 2024-02-08: qty 4

## 2024-02-08 MED ORDER — FUROSEMIDE 10 MG/ML IJ SOLN
60.0000 mg | Freq: Once | INTRAMUSCULAR | Status: AC
  Administered 2024-02-08: 60 mg via INTRAVENOUS
  Filled 2024-02-08: qty 6

## 2024-02-08 MED ORDER — KETOROLAC TROMETHAMINE 15 MG/ML IJ SOLN
15.0000 mg | Freq: Three times a day (TID) | INTRAMUSCULAR | Status: DC | PRN
  Administered 2024-02-08 – 2024-02-09 (×2): 15 mg via INTRAVENOUS
  Filled 2024-02-08 (×2): qty 1

## 2024-02-08 MED ORDER — LOSARTAN POTASSIUM 50 MG PO TABS
50.0000 mg | ORAL_TABLET | Freq: Every day | ORAL | Status: DC
  Administered 2024-02-08 – 2024-02-11 (×4): 50 mg via ORAL
  Filled 2024-02-08 (×4): qty 1

## 2024-02-08 NOTE — Progress Notes (Signed)
 Heart Failure Navigator Progress Note  Assessed for Heart & Vascular TOC clinic readiness.  Patient does not meet criteria due to preserved  EF 50-55% per MD note admitted with acute respiratory failure with hypoxia secondary to pulmonary edema and hypertensive urgency. No HF TOC. .   Navigator will sign off at this time.   Stephane Haddock, BSN, Scientist, clinical (histocompatibility and immunogenetics) Only

## 2024-02-08 NOTE — Progress Notes (Signed)
 PROGRESS NOTE    Julie [name]  FMW:[id number] DOB: 12-16-1975 DOA: 02/08/2024 PCP: Pcp, No  Chief Complaint  Patient presents with   Respiratory Distress    Brief Narrative:   Julie [name] is Julie [name] 48 y.o. female with history of chronic HFpEF, hypertension, diabetes mellitus type 2, COPD/asthma, polysubstance abuse presents to the ER because of worsening shortness of breath over the last 48 hours.  Patient states she has not taken her medicine last few days as she ran out of it and some of them were stolen.  She did come with similar complaints about Verbena Boeding week ago was diuresed and was also placed on losartan  and discharged.  Admits to taking cocaine yesterday.  Patient also complains of frontal headache.  Denies any weakness of the extremities.   ED Course: In the ER patient was hypoxic initially requiring BiPAP.  Chest x-ray shows features concerning for congestion.  Labs show troponin of 45 EKG shows normal sinus rhythm with LVH changes and BNP of 445.  WBC 11.3.  Patient was given Lasix  60 mg IV nitroglycerin  patch and admitted for acute respiratory failure with hypoxia secondary to pulmonary edema and hypertensive urgency.  Blood pressure was 175/126.  At the time of my exam patient has been weaned off the BiPAP and is on 4 L oxygen.  Assessment & Plan:   Principal Problem:   Acute respiratory failure with hypoxia (HCC) Active Problems:   Asthma, chronic, unspecified asthma severity, with acute exacerbation   Uncontrolled type 2 diabetes mellitus with hyperglycemia, without long-term current use of insulin  (HCC)   Cocaine use disorder, severe, dependence (HCC)   Elevated troponin   Acute pulmonary edema (HCC)   Hypertensive urgency  Acute Hypoxic Respiratory Failure Suspect multifactorial due to volume overload and COPD exacerbation with wheezing on exam Continue bipap prn   Management as below  Acute Heart Failure Exacerbation  HFpEF with Exacerbation Echo 08/2023 with  preserved EF Orthopnea.  BNP is elevated above baseline, CXR with pulm vascular congestion - not particularly overloaded on exam - no crackles on exam, no LE edema Will diurese as tolerated  COPD Exacerbation  Faint wheezing on exam Steroids, nebs - will hold off on abx for now   Hypertensive urgency Continue losartan   Holding coreg  for now with recent cocaine use   Elevated troponin  likely from hypertensive urgency and respiratory distress.  Troponins flat, not c/w ACS.  Headache  Head CT without acute abnormality Improved, will monitor   Polysubstance abuse including cocaine.  Advised about quitting.  Diabetes mellitus type 2 takes metformin  and SGLT2 inhibitor which patient has not taken last few days.  Presently on sliding scale coverage.  Last hemoglobin A1c was 8.5  5 months ago.  COPD/asthma patient is not wheezing on examination.  Continue Anoro Ellipta  and as needed albuterol  nebulizer.   Obesity, Class II Body mass index is 38.28 kg/m.    DVT prophylaxis: lovenox  Code Status: full Family Communication: none Disposition:   Status is: Observation The patient remains OBS appropriate and will d/c before 2 midnights.   Consultants:  none  Procedures:  none  Antimicrobials:  Anti-infectives (From admission, onward)    None       Subjective: C/o SOB with lying down   Objective: Vitals:   02/08/24 0515 02/08/24 0530 02/08/24 0642 02/08/24 0711  BP: (!) 170/89 (!) 155/96 (!) 155/92   Pulse: 89 91 90   Resp: (!) 25 16 (!) 23   Temp:  97.8 F (36.6 C)  TempSrc:      SpO2: 96% 98% 96%   Weight:      Height:       No intake or output data in the 24 hours ending 02/08/24 0825 Filed Weights   02/08/24 0257  Weight: 121 kg    Examination:  General exam: sitting up in bed Respiratory system: scattered rhonchi, rare wheezes - occasional coughing spell  Cardiovascular system: RR Gastrointestinal system: protuberant  Central nervous system:  Alert and oriented. No focal neurological deficits. Extremities: no LEE   Data Reviewed: I have personally reviewed following labs and imaging studies  CBC: Recent Labs  Lab 02/08/24 0305 02/08/24 0614  WBC 11.3* 9.2  NEUTROABS 8.2* 6.0  HGB 12.8 12.6  HCT 41.7 41.0  MCV 86.5 86.3  PLT 239 230    Basic Metabolic Panel: Recent Labs  Lab 02/08/24 0305 02/08/24 0614  NA 144 144  K 4.3 4.3  CL 110 112*  CO2 25 24  GLUCOSE 214* 178*  BUN 6 6  CREATININE 0.77 0.81  CALCIUM  9.9 10.1    GFR: Estimated Creatinine Clearance: 118.7 mL/min (by C-G formula based on SCr of 0.81 mg/dL).  Liver Function Tests: Recent Labs  Lab 02/08/24 0305 02/08/24 0614  AST 52* 47*  ALT 68* 61*  ALKPHOS 55 55  BILITOT 0.9 1.0  PROT 7.0 6.5  ALBUMIN 3.5 3.5    CBG: Recent Labs  Lab 02/08/24 0753  GLUCAP 176*     Recent Results (from the past 240 hours)  Resp panel by RT-PCR (RSV, Flu Aariyah Sampey&B, Covid) Anterior Nasal Swab     Status: None   Collection Time: 02/08/24  2:54 AM   Specimen: Anterior Nasal Swab  Result Value Ref Range Status   SARS Coronavirus 2 by RT PCR NEGATIVE NEGATIVE Final   Influenza Roy Snuffer by PCR NEGATIVE NEGATIVE Final   Influenza B by PCR NEGATIVE NEGATIVE Final    Comment: (NOTE) The Xpert Xpress SARS-CoV-2/FLU/RSV plus assay is intended as an aid in the diagnosis of influenza from Nasopharyngeal swab specimens and should not be used as Jafet Wissing sole basis for treatment. Nasal washings and aspirates are unacceptable for Xpert Xpress SARS-CoV-2/FLU/RSV testing.  Fact Sheet for Patients: BloggerCourse.com  Fact Sheet for Healthcare Providers: SeriousBroker.it  This test is not yet approved or cleared by the [place]  FDA and has been authorized for detection and/or diagnosis of SARS-CoV-2 by FDA under an Emergency Use Authorization (EUA). This EUA will remain in effect (meaning this test can be used) for the  duration of the COVID-19 declaration under Section 564(b)(1) of the Act, 21 U.S.C. section 360bbb-3(b)(1), unless the authorization is terminated or revoked.     Resp Syncytial Virus by PCR NEGATIVE NEGATIVE Final    Comment: (NOTE) Fact Sheet for Patients: BloggerCourse.com  Fact Sheet for Healthcare Providers: SeriousBroker.it  This test is not yet approved or cleared by the [place]  FDA and has been authorized for detection and/or diagnosis of SARS-CoV-2 by FDA under an Emergency Use Authorization (EUA). This EUA will remain in effect (meaning this test can be used) for the duration of the COVID-19 declaration under Section 564(b)(1) of the Act, 21 U.S.C. section 360bbb-3(b)(1), unless the authorization is terminated or revoked.  Performed at [facility], [address] [place], [place] [address]          Radiology Studies: CT HEAD WO CONTRAST ( ) Result Date: 02/08/2024 EXAM: CT HEAD WITHOUT CONTRAST 02/08/2024 06:27:19 AM  TECHNIQUE: CT of the head was performed without the administration of intravenous contrast. Automated exposure control, iterative reconstruction, and/or weight based adjustment of the mA/kV was utilized to reduce the radiation dose to as low as reasonably achievable. COMPARISON: None available. CLINICAL HISTORY: Headache, increasing frequency or severity. FINDINGS: BRAIN AND VENTRICLES: No acute hemorrhage. Gray-white differentiation is preserved. No hydrocephalus. No extra-axial collection. No mass effect or midline shift. ORBITS: No acute abnormality. SINUSES: Polypoid mucosal disease within the maxillary sinuses bilaterally. SOFT TISSUES AND SKULL: No acute soft tissue abnormality. No skull fracture. IMPRESSION: 1. No acute intracranial abnormality. 2. Polypoid mucosal disease within the maxillary sinuses bilaterally. Electronically signed by: Evalene Coho MD 02/08/2024 06:33 AM EDT RP  Workstation: [id number]   DG Chest Portable 1 View Result Date: 02/08/2024 CLINICAL DATA:  Shortness of breath and respiratory distress EXAM: PORTABLE CHEST 1 VIEW COMPARISON:  01/30/2024 FINDINGS: Stable cardiomegaly. Pulmonary vascular congestion. Elevated right hemidiaphragm. No focal consolidation, pleural effusion, or pneumothorax. No displaced rib fractures. IMPRESSION: Cardiomegaly and pulmonary vascular congestion. Electronically Signed   By: Norman Gatlin M.D.   On: 02/08/2024 03:12        Scheduled Meds:  enoxaparin  (LOVENOX ) injection  60 mg Subcutaneous Q24H   furosemide   40 mg Intravenous Once   insulin  aspart  0-9 Units Subcutaneous TID WC   ipratropium-albuterol   3 mL Nebulization Q6H   losartan   50 mg Oral Daily   methylPREDNISolone  (SOLU-MEDROL ) injection  40 mg Intravenous Q12H   Followed by   NOREEN ON 02/09/2024] predniSONE   40 mg Oral Q breakfast   Continuous Infusions:   LOS: 0 days    Time spent: over 30 min     Meliton Monte, MD [facility]   To contact the attending provider between 7A-7P or the covering provider during after hours 7P-7A, please log into the web site [email or web address] and access using universal [place] password for that web site. If you do not have the password, please call the hospital operator.  02/08/2024, 8:25 AM

## 2024-02-08 NOTE — H&P (Signed)
 History and Physical    Julie [name] FMW:[id number] DOB: Mar 28, 1976 DOA: 02/08/2024  Patient coming from: Home.  Chief Complaint: Shortness of breath.  HPI: Julie [name] is a 48 y.o. female with history of chronic HFpEF, hypertension, diabetes mellitus type 2, COPD/asthma, polysubstance abuse presents to the ER because of worsening shortness of breath over the last 48 hours.  Patient states she has not taken her medicine last few days as she ran out of it and some of them were stolen.  She did come with similar complaints about a week ago was diuresed and was also placed on losartan  and discharged.  Admits to taking cocaine yesterday.  Patient also complains of frontal headache.  Denies any weakness of the extremities.  ED Course: In the ER patient was hypoxic initially requiring BiPAP.  Chest x-ray shows features concerning for congestion.  Labs show troponin of 45 EKG shows normal sinus rhythm with LVH changes and BNP of 445.  WBC 11.3.  Patient was given Lasix  60 mg IV nitroglycerin  patch and admitted for acute respiratory failure with hypoxia secondary to pulmonary edema and hypertensive urgency.  Blood pressure was 175/126.  At the time of my exam patient has been weaned off the BiPAP and is on 4 L oxygen.  Review of Systems: As per HPI, rest all negative.   Past Medical History:  Diagnosis Date   Asthma    Diabetes mellitus without complication (HCC)    Fracture, humerus closed, shaft 07/11/2014   left   History of bronchitis    Mental health problem    Open fracture of great toe of left foot 12/25/2015    Past Surgical History:  Procedure Laterality Date   CESAREAN SECTION     ECTOPIC PREGNANCY SURGERY  2003   INCISION AND DRAINAGE Left 12/25/2015   Procedure: INCISION AND DRAINAGE With  Amputation of distal tip of Left Great and Second Toe.;  Surgeon: Kay CHRISTELLA Cummins, MD;  Location: [facility];  Service: Orthopedics;  Laterality: Left;   LIPOMA EXCISION     chest   ORIF  HUMERUS FRACTURE Left 07/15/2014   Procedure: OPEN REDUCTION INTERNAL FIXATION (ORIF) LEFT HUMERAL SHAFT ;  Surgeon: Evalene JONETTA Chancy, MD;  Location: [facility];  Service: Orthopedics;  Laterality: Left;   TUBAL LIGATION  2003     reports that she has been smoking cigarettes. She has a 9 pack-year smoking history. She has never used smokeless tobacco. She reports current alcohol  use. She reports current drug use. Drugs: Marijuana, Crack cocaine, and MDMA (Ecstacy).  No Known Allergies  History reviewed. No pertinent family history.  Prior to Admission medications   Medication Sig Start Date End Date Taking? Authorizing Provider  albuterol  (VENTOLIN  HFA) 108 (90 Base) MCG/ACT inhaler Inhale 1-2 puffs into the lungs every 6 (six) hours as needed for wheezing or shortness of breath. 01/08/24  Yes Myriam Dorn BROCKS, PA  carvedilol  (COREG ) 6.25 MG tablet Take 1 tablet (6.25 mg total) by mouth 2 (two) times daily with a meal. 12/05/23  Yes Renne Homans, MD  furosemide  (LASIX ) 20 MG tablet Take 1 tablet (20 mg total) by mouth daily. 12/06/23  Yes Amoako, Prince, MD  losartan  (COZAAR ) 50 MG tablet Take 1 tablet (50 mg total) by mouth daily. 12/06/23  Yes Amoako, Prince, MD  dapagliflozin  propanediol (FARXIGA ) 5 MG TABS tablet Take 1 tablet (5 mg total) by mouth daily before breakfast. 12/05/23   Renne Homans, MD  lidocaine  (LIDODERM ) 5 %  Place 1 patch onto the skin daily. Remove & Discard patch within 12 hours or as directed by MD Patient not taking: Reported on 01/30/2024 12/30/23   Barrett, Warren SAILOR, PA-C  metFORMIN  (GLUCOPHAGE ) 500 MG tablet Take 1 tablet (500 mg total) by mouth 2 (two) times daily with a meal. Patient not taking: Reported on 02/08/2024 12/05/23   Amoako, Prince, MD  methocarbamol  (ROBAXIN ) 500 MG tablet Take 1 tablet (500 mg total) by mouth 2 (two) times daily. Patient not taking: Reported on 01/30/2024 12/30/23   Barrett, Warren SAILOR, PA-C  umeclidinium-vilanterol (ANORO  ELLIPTA) 62.5-25 MCG/ACT AEPB Inhale 1 puff into the lungs daily. Patient not taking: Reported on 02/08/2024 01/08/24   Myriam Dorn BROCKS, PA    Physical Exam: Constitutional: Moderately built and nourished. Vitals:   02/08/24 0330 02/08/24 0400 02/08/24 0515 02/08/24 0530  BP: (!) 158/99 (!) 132/96 (!) 170/89 (!) 155/96  Pulse: 89 88 89 91  Resp: (!) 25 (!) 24 (!) 25 16  Temp:      TempSrc:      SpO2: 99% 100% 96% 98%  Weight:      Height:       Eyes: Anicteric no pallor. ENMT: No discharge from the ears eyes nose or mouth. Neck: No mass felt.  No neck rigidity. Respiratory: No rhonchi or crepitations. Cardiovascular: S1-S2 heard. Abdomen: Soft nontender bowel sound present. Musculoskeletal: No edema. Skin: No rash. Neurologic: Alert awake oriented to time place and person.  Moves all extremities 5 x 5. Psychiatric: Appears normal.  Normal affect.   Labs on Admission: I have personally reviewed following labs and imaging studies  CBC: Recent Labs  Lab 02/08/24 0305  WBC 11.3*  NEUTROABS 8.2*  HGB 12.8  HCT 41.7  MCV 86.5  PLT 239   Basic Metabolic Panel: Recent Labs  Lab 02/08/24 0305  NA 144  K 4.3  CL 110  CO2 25  GLUCOSE 214*  BUN 6  CREATININE 0.77  CALCIUM  9.9   GFR: Estimated Creatinine Clearance: 120.2 mL/min (by C-G formula based on SCr of 0.77 mg/dL). Liver Function Tests: Recent Labs  Lab 02/08/24 0305  AST 52*  ALT 68*  ALKPHOS 55  BILITOT 0.9  PROT 7.0  ALBUMIN 3.5   No results for input(s): LIPASE, AMYLASE in the last 168 hours. No results for input(s): AMMONIA in the last 168 hours. Coagulation Profile: No results for input(s): INR, PROTIME in the last 168 hours. Cardiac Enzymes: No results for input(s): CKTOTAL, CKMB, CKMBINDEX, TROPONINI in the last 168 hours. BNP (last 3 results) No results for input(s): PROBNP in the last 8760 hours. HbA1C: No results for input(s): HGBA1C in the last 72  hours. CBG: No results for input(s): GLUCAP in the last 168 hours. Lipid Profile: No results for input(s): CHOL, HDL, LDLCALC, TRIG, CHOLHDL, LDLDIRECT in the last 72 hours. Thyroid  Function Tests: No results for input(s): TSH, T4TOTAL, FREET4, T3FREE, THYROIDAB in the last 72 hours. Anemia Panel: No results for input(s): VITAMINB12, FOLATE, FERRITIN, TIBC, IRON, RETICCTPCT in the last 72 hours. Urine analysis:    Component Value Date/Time   COLORURINE YELLOW 03/17/2023 1746   APPEARANCEUR CLOUDY (A) 03/17/2023 1746   LABSPEC 1.013 03/17/2023 1746   PHURINE 7.0 03/17/2023 1746   GLUCOSEU NEGATIVE 03/17/2023 1746   HGBUR MODERATE (A) 03/17/2023 1746   BILIRUBINUR NEGATIVE 03/17/2023 1746   KETONESUR NEGATIVE 03/17/2023 1746   PROTEINUR NEGATIVE 03/17/2023 1746   UROBILINOGEN 0.2 03/21/2019 0936   NITRITE NEGATIVE 03/17/2023 1746  LEUKOCYTESUR LARGE (A) 03/17/2023 1746   Sepsis Labs: @LABRCNTIP (procalcitonin:4,lacticidven:4) ) Recent Results (from the past 240 hours)  Resp panel by RT-PCR (RSV, Flu A&B, Covid) Anterior Nasal Swab     Status: None   Collection Time: 02/08/24  2:54 AM   Specimen: Anterior Nasal Swab  Result Value Ref Range Status   SARS Coronavirus 2 by RT PCR NEGATIVE NEGATIVE Final   Influenza A by PCR NEGATIVE NEGATIVE Final   Influenza B by PCR NEGATIVE NEGATIVE Final    Comment: (NOTE) The Xpert Xpress SARS-CoV-2/FLU/RSV plus assay is intended as an aid in the diagnosis of influenza from Nasopharyngeal swab specimens and should not be used as a sole basis for treatment. Nasal washings and aspirates are unacceptable for Xpert Xpress SARS-CoV-2/FLU/RSV testing.  Fact Sheet for Patients: BloggerCourse.com  Fact Sheet for Healthcare Providers: SeriousBroker.it  This test is not yet approved or cleared by the [place]  FDA and has been authorized for detection  and/or diagnosis of SARS-CoV-2 by FDA under an Emergency Use Authorization (EUA). This EUA will remain in effect (meaning this test can be used) for the duration of the COVID-19 declaration under Section 564(b)(1) of the Act, 21 U.S.C. section 360bbb-3(b)(1), unless the authorization is terminated or revoked.     Resp Syncytial Virus by PCR NEGATIVE NEGATIVE Final    Comment: (NOTE) Fact Sheet for Patients: BloggerCourse.com  Fact Sheet for Healthcare Providers: SeriousBroker.it  This test is not yet approved or cleared by the [place]  FDA and has been authorized for detection and/or diagnosis of SARS-CoV-2 by FDA under an Emergency Use Authorization (EUA). This EUA will remain in effect (meaning this test can be used) for the duration of the COVID-19 declaration under Section 564(b)(1) of the Act, 21 U.S.C. section 360bbb-3(b)(1), unless the authorization is terminated or revoked.  Performed at [facility], [address], [place], [place] [address]      Radiological Exams on Admission: DG Chest Portable 1 View Result Date: 02/08/2024 CLINICAL DATA:  Shortness of breath and respiratory distress EXAM: PORTABLE CHEST 1 VIEW COMPARISON:  01/30/2024 FINDINGS: Stable cardiomegaly. Pulmonary vascular congestion. Elevated right hemidiaphragm. No focal consolidation, pleural effusion, or pneumothorax. No displaced rib fractures. IMPRESSION: Cardiomegaly and pulmonary vascular congestion. Electronically Signed   By: Norman Gatlin M.D.   On: 02/08/2024 03:12    EKG: Independently reviewed.  Normal sinus rhythm.  LVH changes.  Assessment/Plan Principal Problem:   Acute respiratory failure with hypoxia (HCC) Active Problems:   Asthma, chronic, unspecified asthma severity, with acute exacerbation   Uncontrolled type 2 diabetes mellitus with hyperglycemia, without long-term current use of insulin  (HCC)   Cocaine use disorder,  severe, dependence (HCC)   Elevated troponin   Acute pulmonary edema (HCC)   Hypertensive urgency    Acute respiratory failure with hypoxia secondary to acute pulmonary edema likely in the setting with noncompliance with medication including antihypertensives and Lasix .  Patient was given Lasix  60 mg IV following which BiPAP was weaned off.  Presently on 4 L oxygen.  Patient's last 2D echo done in March 2025 showed EF of 50 to 55%.  Will closely monitor if patient needs more diuretics. Hypertensive urgency restarted patient on losartan .  Patient also received IV Lasix  and nitroglycerin  paste.  Follow blood pressure trends holding Coreg  due to patient have just taken cocaine. Elevated troponin likely from hypertensive urgency and respiratory distress.  Will trend cardiac markers. Headache likely from hypertensive urgency CT head is pending.  Appears nonfocal. Polysubstance  abuse including cocaine.  Advised about quitting. Diabetes mellitus type 2 takes metformin  and SGLT2 inhibitor which patient has not taken last few days.  Presently on sliding scale coverage.  Last hemoglobin A1c was 8.5  5 months ago. COPD/asthma patient is not wheezing on examination.  Continue Anoro Ellipta  and as needed albuterol  nebulizer.  Since patient has acute respiratory failure with hypoxia with CHF exacerbation and hypertensive urgency will need close monitoring further workup and more than 2 midnight stay.   DVT prophylaxis: Lovenox  if CT head is negative. Code Status: Full code. Family Communication: Discussed with patient. Disposition Plan: Progressive care. Consults called: None. Admission status: Observation.

## 2024-02-08 NOTE — Plan of Care (Signed)
  Problem: Education: Goal: Ability to describe self-care measures that may prevent or decrease complications (Diabetes Survival Skills Education) will improve Outcome: Progressing   Problem: Fluid Volume: Goal: Ability to maintain a balanced intake and output will improve Outcome: Progressing   Problem: Metabolic: Goal: Ability to maintain appropriate glucose levels will improve Outcome: Progressing   Problem: Metabolic: Goal: Ability to maintain appropriate glucose levels will improve Outcome: Progressing   Problem: Nutritional: Goal: Maintenance of adequate nutrition will improve Outcome: Progressing   Problem: Skin Integrity: Goal: Risk for impaired skin integrity will decrease Outcome: Progressing

## 2024-02-08 NOTE — ED Triage Notes (Signed)
 Patient BIB EMS for evaluation of shortness of breath. EMS report patient woke up out of sleep coughing up frothy sputum. Patient has hx COPD. Reports headache x4 hours.  Patient arrives to ED on CPAP. Respirations even and labored. Patient alert and oriented.  1 nitroglycerin  with EMS

## 2024-02-08 NOTE — ED Provider Notes (Signed)
 [facility] Provider Note   CSN: [id number] Arrival date & time: 02/08/24  0251     History Chief Complaint  Patient presents with   Respiratory Distress    HPI ANNELIESE LEBLOND is a 48 y.o. female presenting for chief complaint shortness of breath. History of similar.  History of heart failure COPD, CHF. States that he has more single-pass 4 days.  Decreased urine output.  Believes she has been compliant with her medications. O2 sats in the 70s per EMS prior to O2 administration.  Patient's recorded medical, surgical, social, medication list and allergies were reviewed in the Snapshot window as part of the initial history.   Review of Systems   Review of Systems  Constitutional:  Negative for chills and fever.  HENT:  Negative for ear pain and sore throat.   Eyes:  Negative for pain and visual disturbance.  Respiratory:  Positive for cough and shortness of breath. Negative for wheezing.   Cardiovascular:  Positive for leg swelling. Negative for chest pain and palpitations.  Gastrointestinal:  Negative for abdominal pain and vomiting.  Genitourinary:  Negative for dysuria and hematuria.  Musculoskeletal:  Negative for arthralgias and back pain.  Skin:  Negative for color change and rash.  Neurological:  Negative for seizures and syncope.  All other systems reviewed and are negative.   Physical Exam Updated Vital Signs BP (!) 155/96   Pulse 91   Temp 97.7 F (36.5 C) (Oral)   Resp 16   Ht 5' 10 (1.778 m)   Wt 121 kg   LMP  (LMP Unknown) Comment: pt reports last period in July but unsure of exact date; states she has irregular periods  SpO2 98%   BMI 38.28 kg/m  Physical Exam Vitals and nursing note reviewed.  Constitutional:      General: She is not in acute distress.    Appearance: She is well-developed. She is ill-appearing.  HENT:     Head: Normocephalic and atraumatic.  Eyes:     Conjunctiva/sclera:  Conjunctivae normal.  Cardiovascular:     Rate and Rhythm: Normal rate and regular rhythm.     Heart sounds: No murmur heard. Pulmonary:     Effort: Pulmonary effort is normal. No respiratory distress.     Breath sounds: Rales present. No rhonchi.  Abdominal:     General: There is no distension.     Palpations: Abdomen is soft.     Tenderness: There is no abdominal tenderness. There is no right CVA tenderness or left CVA tenderness.  Musculoskeletal:        General: No swelling or tenderness. Normal range of motion.     Cervical back: Neck supple.     Right lower leg: Edema present.     Left lower leg: Edema present.  Skin:    General: Skin is warm and dry.  Neurological:     General: No focal deficit present.     Mental Status: She is alert and oriented to person, place, and time. Mental status is at baseline.     Cranial Nerves: No cranial nerve deficit.      ED Course/ Medical Decision Making/ A&P Clinical Course as of 02/08/24 0613  Wed Feb 08, 2024  0509 CO2: 25 [CC]    Clinical Course User Index [CC] Jerral Meth, MD    Procedures .Critical Care  Performed by: Jerral Meth, MD Authorized by: Jerral Meth, MD   Critical care provider statement:  Critical care time (minutes):  30   Critical care was necessary to treat or prevent imminent or life-threatening deterioration of the following conditions:  Respiratory failure   Critical care was time spent personally by me on the following activities:  Development of treatment plan with patient or surrogate, discussions with consultants, evaluation of patient's response to treatment, examination of patient, ordering and review of laboratory studies, ordering and review of radiographic studies, ordering and performing treatments and interventions, pulse oximetry, re-evaluation of patient's condition and review of old charts   Care discussed with: admitting provider      Medical Decision Making:   KHALEELAH YOWELL is a 48 y.o. female with a history of CHF and COPD, who presented to the ED today with acute on chronic SOB. They are endorsing worsening of their baseline dyspnea over the past 48 hours. Their baseline is a 0L O2 requirement. At their baseline they are able to get around the house and they are not able to at this time.   On my initial exam, the pt was SOB and tachypneic. LE edema present.  They are endorsing cough/fever/sputum production.    Reviewed and confirmed nursing documentation for past medical history, family history, social history.    Initial Assessment:   With the patient's presentation of SOB in the above setting, most likely diagnosis is CHF Exacerbation. Other diagnoses were considered including (but not limited to) CAP, PE, ACS, viral infection, PTX. These are considered less likely due to history of present illness and physical exam findings.   This is most consistent with an acute life/limb threatening illness complicated by underlying chronic conditions.  Initial Plan:  Empiric treatment of patient's symptoms with oxygen administration. Given extremis nature of the presentation, patient required non invasive ventilation initiation.  EKG/Troponin testing/BNP testing to evaluate for cardiac pathology. Evaluation for infectious versus intrathoracic abnormality with chest x-ray  Evaluation for volume overload with BNP  Screening labs including CBC and Metabolic panel to evaluate for infectious or metabolic etiology of disease.  Patient's Wells score is low and patient does not warrant further objective evaluation for PE based on consistency of presentation of alternative diagnosis.  Objective evaluation as below reviewed   Initial Study Results:   Laboratory  All laboratory results reviewed without evidence of clinically relevant pathology.   Exceptions include: troponin/BNP elevations   EKG EKG was reviewed independently. Rate, rhythm, axis, intervals all examined  and without medically relevant abnormality. ST segments without concerns for elevations.    Radiology:  All images reviewed independently. Agree with radiology report at this time.   DG Chest Portable 1 View Result Date: 02/08/2024 CLINICAL DATA:  Shortness of breath and respiratory distress EXAM: PORTABLE CHEST 1 VIEW COMPARISON:  01/30/2024 FINDINGS: Stable cardiomegaly. Pulmonary vascular congestion. Elevated right hemidiaphragm. No focal consolidation, pleural effusion, or pneumothorax. No displaced rib fractures. IMPRESSION: Cardiomegaly and pulmonary vascular congestion. Electronically Signed   By: Norman Gatlin M.D.   On: 02/08/2024 03:12   DG Chest 2 View Result Date: 01/30/2024 EXAM: 2 VIEW(S) XRAY OF THE CHEST 01/30/2024 07:59:00 AM COMPARISON: PA and lateral radiographs of the chest dated 02/03/2024. CLINICAL HISTORY: Shortness of breath. Reason for exam: shortness of breath ; Triage notes: Pt reports chest pressure, SOB and left foot pain for 2 days that worsened. Reports her foot feels like its burning. Pt on lasix  but not taking it as ordered. FINDINGS: LUNGS AND PLEURA: No focal pulmonary opacity. No pulmonary edema. No pleural effusion. No pneumothorax.  HEART AND MEDIASTINUM: The heart is normal in size. No acute abnormality of the cardiac and mediastinal silhouettes. BONES AND SOFT TISSUES: No acute osseous abnormality. There is mild elevation of the right hemidiaphragm, as before. The study is limited by large body habitus. IMPRESSION: 1. No acute cardiopulmonary pathology. 2. Mild elevation of the right hemidiaphragm, as before. 3. Study limited by large body habitus. Electronically signed by: evalene coho 01/30/2024 08:37 AM EDT RP Workstation: [id number]   DG Chest 2 View Result Date: 01/24/2024 CLINICAL DATA:  Chest tightness shortness breath for 2 days. EXAM: CHEST - 2 VIEW COMPARISON:  PA Lat chest 01/08/2024. FINDINGS: The heart is moderately enlarged. Central vessels are  mildly prominent but there is no overt edema. Mild chronic elevation right hemidiaphragm. The lungs are clear of infiltrates. The sulci are sharp. The mediastinum is normally outlined. There is thoracic spondylosis. Thoracic cage intact. IMPRESSION: 1. Moderate cardiomegaly with mild central vascular prominence but no overt edema. 2. No evidence of pneumonia. Electronically Signed   By: Francis Quam M.D.   On: 01/24/2024 22:38     .   Final Assessment and Plan:   She was initiated on diureses above typical dose. Reassessed after initiation of medical therapies, patient is grossly improved and no longer in acute distress.    Given the advanced nature of the patient's presentation, patient will require advanced care and admission was arranged.   Disposition:   Based on the above findings, I believe this patient is stable for admission.    Patient/family educated about specific findings on our evaluation and explained exact reasons for admission.  Patient/family educated about clinical situation and time was allowed to answer questions.   Admission team communicated with and agreed with need for admission. Patient admitted. Patient  ready to move at this time.     Emergency Department Medication Summary:   Medications  insulin  aspart (novoLOG ) injection 0-9 Units (has no administration in time range)  enoxaparin  (LOVENOX ) injection 60 mg (has no administration in time range)  acetaminophen  (TYLENOL ) tablet 650 mg (has no administration in time range)    Or  acetaminophen  (TYLENOL ) suppository 650 mg (has no administration in time range)  losartan  (COZAAR ) tablet 50 mg (has no administration in time range)  hydrALAZINE  (APRESOLINE ) injection 5 mg (has no administration in time range)  nitroGLYCERIN  (NITROGLYN) 2 % ointment 1 inch (1 inch Topical Given 02/08/24 0315)  furosemide  (LASIX ) injection 60 mg (60 mg Intravenous Given 02/08/24 0408)            Clinical Impression:  1. Acute  respiratory failure with hypoxia (HCC)      Admit     Final Clinical Impression(s) / ED Diagnoses Final diagnoses:  Acute respiratory failure with hypoxia [facility])    Rx / DC Orders ED Discharge Orders     None         Jerral Meth, MD 02/08/24 [id number]

## 2024-02-08 NOTE — Progress Notes (Signed)
   02/08/24 0515  BiPAP/CPAP/SIPAP  Reason BIPAP/CPAP not in use Other(comment) (Pt removed bipap mask and walked across hall to the bathroom. Pt still labored, but wants to try [place] at this time. MD and RN aware.)  BiPAP/CPAP /SiPAP Vitals  Pulse Rate 89  Resp (!) 25  BP (!) 170/89  SpO2 96 %  MEWS Score/Color  MEWS Score 1  MEWS Score Color Green

## 2024-02-09 DIAGNOSIS — I5033 Acute on chronic diastolic (congestive) heart failure: Secondary | ICD-10-CM | POA: Diagnosis present

## 2024-02-09 DIAGNOSIS — J441 Chronic obstructive pulmonary disease with (acute) exacerbation: Secondary | ICD-10-CM | POA: Diagnosis present

## 2024-02-09 DIAGNOSIS — R519 Headache, unspecified: Secondary | ICD-10-CM | POA: Diagnosis present

## 2024-02-09 DIAGNOSIS — Z6838 Body mass index (BMI) 38.0-38.9, adult: Secondary | ICD-10-CM | POA: Diagnosis not present

## 2024-02-09 DIAGNOSIS — I160 Hypertensive urgency: Secondary | ICD-10-CM | POA: Diagnosis present

## 2024-02-09 DIAGNOSIS — R000 Tachycardia, unspecified: Secondary | ICD-10-CM | POA: Diagnosis present

## 2024-02-09 DIAGNOSIS — Z87891 Personal history of nicotine dependence: Secondary | ICD-10-CM | POA: Diagnosis not present

## 2024-02-09 DIAGNOSIS — J9601 Acute respiratory failure with hypoxia: Secondary | ICD-10-CM | POA: Diagnosis present

## 2024-02-09 DIAGNOSIS — F1420 Cocaine dependence, uncomplicated: Secondary | ICD-10-CM | POA: Diagnosis present

## 2024-02-09 DIAGNOSIS — Z7984 Long term (current) use of oral hypoglycemic drugs: Secondary | ICD-10-CM | POA: Diagnosis not present

## 2024-02-09 DIAGNOSIS — Z5901 Sheltered homelessness: Secondary | ICD-10-CM | POA: Diagnosis not present

## 2024-02-09 DIAGNOSIS — Z79899 Other long term (current) drug therapy: Secondary | ICD-10-CM | POA: Diagnosis not present

## 2024-02-09 DIAGNOSIS — E1165 Type 2 diabetes mellitus with hyperglycemia: Secondary | ICD-10-CM | POA: Diagnosis present

## 2024-02-09 DIAGNOSIS — I110 Hypertensive heart disease with heart failure: Secondary | ICD-10-CM | POA: Diagnosis present

## 2024-02-09 DIAGNOSIS — Z91148 Patient's other noncompliance with medication regimen for other reason: Secondary | ICD-10-CM | POA: Diagnosis not present

## 2024-02-09 DIAGNOSIS — Z1152 Encounter for screening for COVID-19: Secondary | ICD-10-CM | POA: Diagnosis not present

## 2024-02-09 DIAGNOSIS — R0602 Shortness of breath: Secondary | ICD-10-CM | POA: Diagnosis present

## 2024-02-09 LAB — BASIC METABOLIC PANEL WITH GFR
Anion gap: 6 (ref 5–15)
BUN: 15 mg/dL (ref 6–20)
CO2: 26 mmol/L (ref 22–32)
Calcium: 10.2 mg/dL (ref 8.9–10.3)
Chloride: 106 mmol/L (ref 98–111)
Creatinine, Ser: 0.87 mg/dL (ref 0.44–1.00)
GFR, Estimated: >60 mL/min (ref >60)
Glucose, Bld: 221 mg/dL — ABNORMAL HIGH (ref 70–99)
Potassium: 3.6 mmol/L (ref 3.5–5.1)
Sodium: 138 mmol/L (ref 135–145)

## 2024-02-09 LAB — HEMOGLOBIN A1C
Hgb A1c MFr Bld: 8.9 % — ABNORMAL HIGH (ref 4.8–5.6)
Mean Plasma Glucose: 208.73 mg/dL

## 2024-02-09 LAB — GLUCOSE, RANDOM: Glucose, Bld: 352 mg/dL — ABNORMAL HIGH (ref 70–99)

## 2024-02-09 LAB — GLUCOSE, CAPILLARY
Glucose-Capillary: 289 mg/dL — ABNORMAL HIGH (ref 70–99)
Glucose-Capillary: 227 mg/dL — ABNORMAL HIGH (ref 70–99)
Glucose-Capillary: 422 mg/dL — ABNORMAL HIGH (ref 70–99)
Glucose-Capillary: 326 mg/dL — ABNORMAL HIGH (ref 70–99)

## 2024-02-09 MED ORDER — POTASSIUM CHLORIDE CRYS ER 20 MEQ PO TBCR
40.0000 meq | EXTENDED_RELEASE_TABLET | ORAL | Status: AC
  Administered 2024-02-09 (×2): 40 meq via ORAL
  Filled 2024-02-09 (×2): qty 2

## 2024-02-09 MED ORDER — INSULIN ASPART 100 UNIT/ML IJ SOLN
0.0000 [IU] | Freq: Every day | INTRAMUSCULAR | Status: DC
  Administered 2024-02-09: 4 [IU] via SUBCUTANEOUS
  Administered 2024-02-10: 5 [IU] via SUBCUTANEOUS

## 2024-02-09 MED ORDER — INSULIN GLARGINE 100 UNIT/ML ~~LOC~~ SOLN
10.0000 [IU] | Freq: Every day | SUBCUTANEOUS | Status: DC
  Administered 2024-02-09: 10 [IU] via SUBCUTANEOUS
  Filled 2024-02-09: qty 0.1

## 2024-02-09 MED ORDER — INSULIN GLARGINE 100 UNIT/ML ~~LOC~~ SOLN
10.0000 [IU] | Freq: Two times a day (BID) | SUBCUTANEOUS | Status: DC
  Administered 2024-02-09 – 2024-02-11 (×4): 10 [IU] via SUBCUTANEOUS
  Filled 2024-02-09 (×5): qty 0.1

## 2024-02-09 MED ORDER — IPRATROPIUM-ALBUTEROL 0.5-2.5 (3) MG/3ML IN SOLN
3.0000 mL | Freq: Four times a day (QID) | RESPIRATORY_TRACT | Status: DC | PRN
  Administered 2024-02-10: 3 mL via RESPIRATORY_TRACT
  Filled 2024-02-09: qty 3

## 2024-02-09 MED ORDER — TRAZODONE HCL 50 MG PO TABS
50.0000 mg | ORAL_TABLET | Freq: Every evening | ORAL | Status: DC | PRN
  Administered 2024-02-09 – 2024-02-10 (×2): 50 mg via ORAL
  Filled 2024-02-09 (×2): qty 1

## 2024-02-09 MED ORDER — PREDNISONE 20 MG PO TABS
40.0000 mg | ORAL_TABLET | Freq: Every day | ORAL | Status: AC
  Administered 2024-02-10: 40 mg via ORAL
  Filled 2024-02-09: qty 2

## 2024-02-09 MED ORDER — INSULIN ASPART 100 UNIT/ML IJ SOLN
0.0000 [IU] | Freq: Three times a day (TID) | INTRAMUSCULAR | Status: DC
  Administered 2024-02-09: 7 [IU] via SUBCUTANEOUS
  Administered 2024-02-09: 20 [IU] via SUBCUTANEOUS
  Administered 2024-02-10: 7 [IU] via SUBCUTANEOUS
  Administered 2024-02-10 (×2): 11 [IU] via SUBCUTANEOUS
  Administered 2024-02-11: 4 [IU] via SUBCUTANEOUS

## 2024-02-09 MED ORDER — INSULIN ASPART 100 UNIT/ML IJ SOLN
5.0000 [IU] | Freq: Three times a day (TID) | INTRAMUSCULAR | Status: DC
  Administered 2024-02-10 – 2024-02-11 (×4): 5 [IU] via SUBCUTANEOUS

## 2024-02-09 MED ORDER — GUAIFENESIN ER 600 MG PO TB12
600.0000 mg | ORAL_TABLET | Freq: Two times a day (BID) | ORAL | Status: DC
  Administered 2024-02-09 – 2024-02-11 (×5): 600 mg via ORAL
  Filled 2024-02-09 (×5): qty 1

## 2024-02-09 MED ORDER — POLYETHYLENE GLYCOL 3350 17 G PO PACK
17.0000 g | PACK | Freq: Two times a day (BID) | ORAL | Status: DC
  Administered 2024-02-09 (×2): 17 g via ORAL
  Filled 2024-02-09 (×4): qty 1

## 2024-02-09 MED ORDER — INSULIN GLARGINE-YFGN 100 UNIT/ML ~~LOC~~ SOLN: 10.0000 [IU] | Freq: Every day | SUBCUTANEOUS | Status: DC

## 2024-02-09 MED ORDER — BISACODYL 5 MG PO TBEC
5.0000 mg | DELAYED_RELEASE_TABLET | Freq: Every day | ORAL | Status: DC | PRN
  Administered 2024-02-09 – 2024-02-11 (×2): 5 mg via ORAL
  Filled 2024-02-09 (×2): qty 1

## 2024-02-09 NOTE — Plan of Care (Signed)
  Problem: Education: Goal: Ability to describe self-care measures that may prevent or decrease complications (Diabetes Survival Skills Education) will improve Outcome: Progressing   Problem: Coping: Goal: Ability to adjust to condition or change in health will improve Outcome: Progressing   Problem: Fluid Volume: Goal: Ability to maintain a balanced intake and output will improve Outcome: Progressing   Problem: Health Behavior/Discharge Planning: Goal: Ability to identify and utilize available resources and services will improve Outcome: Progressing   Problem: Health Behavior/Discharge Planning: Goal: Ability to manage health-related needs will improve Outcome: Progressing   Problem: Skin Integrity: Goal: Risk for impaired skin integrity will decrease Outcome: Progressing   Problem: Education: Goal: Knowledge of General Education information will improve Description: Including pain rating scale, medication(s)/side effects and non-pharmacologic comfort measures Outcome: Progressing   Problem: Clinical Measurements: Goal: Ability to maintain clinical measurements within normal limits will improve Outcome: Progressing   Problem: Clinical Measurements: Goal: Will remain free from infection Outcome: Progressing
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
## 2024-02-09 NOTE — Progress Notes (Signed)
 Pt CBG 422. MD Perri has been made aware and orders have been placed for STAT Glucose lab per protocol. Per Dr. Perri will give 20 units of insulin .

## 2024-02-09 NOTE — Progress Notes (Signed)
 This chaplain responded to the unit consult for creating/updating the Pt. Advance Directive. The Pt. confirmed her interest in AD education and expressed a preference for revisit at another time.  This chaplain left AD education with the Pt. and will F/U on Thursday.  Chaplain Leeroy Hummer [phone]

## 2024-02-10 DIAGNOSIS — J9601 Acute respiratory failure with hypoxia: Secondary | ICD-10-CM | POA: Diagnosis not present

## 2024-02-10 LAB — BASIC METABOLIC PANEL WITH GFR
Anion gap: 12 (ref 5–15)
BUN: 14 mg/dL (ref 6–20)
CO2: 27 mmol/L (ref 22–32)
Calcium: 10.8 mg/dL — ABNORMAL HIGH (ref 8.9–10.3)
Chloride: 106 mmol/L (ref 98–111)
Creatinine, Ser: 0.79 mg/dL (ref 0.44–1.00)
GFR, Estimated: >60 mL/min (ref >60)
Glucose, Bld: 170 mg/dL — ABNORMAL HIGH (ref 70–99)
Potassium: 3.9 mmol/L (ref 3.5–5.1)
Sodium: 145 mmol/L (ref 135–145)

## 2024-02-10 LAB — GLUCOSE, CAPILLARY
Glucose-Capillary: 227 mg/dL — ABNORMAL HIGH (ref 70–99)
Glucose-Capillary: 292 mg/dL — ABNORMAL HIGH (ref 70–99)
Glucose-Capillary: 291 mg/dL — ABNORMAL HIGH (ref 70–99)
Glucose-Capillary: 362 mg/dL — ABNORMAL HIGH (ref 70–99)

## 2024-02-10 LAB — ALBUMIN: Albumin: 3.4 g/dL — ABNORMAL LOW (ref 3.5–5.0)

## 2024-02-10 LAB — BRAIN NATRIURETIC PEPTIDE: B Natriuretic Peptide: 312.8 pg/mL — ABNORMAL HIGH (ref 0.0–100.0)

## 2024-02-10 MED ORDER — AMLODIPINE BESYLATE 5 MG PO TABS
5.0000 mg | ORAL_TABLET | Freq: Every day | ORAL | Status: DC
  Administered 2024-02-10 – 2024-02-11 (×2): 5 mg via ORAL
  Filled 2024-02-10 (×2): qty 1

## 2024-02-10 NOTE — Plan of Care (Signed)
  Problem: Clinical Measurements: Goal: Will remain free from infection Outcome: Progressing Goal: Cardiovascular complication will be avoided Outcome: Progressing   Problem: Activity: Goal: Risk for activity intolerance will decrease Outcome: Progressing   Problem: Elimination: Goal: Will not experience complications related to urinary retention Outcome: Progressing   Problem: Health Behavior/Discharge Planning: Goal: Ability to manage health-related needs will improve Outcome: Not Progressing
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
## 2024-02-10 NOTE — TOC Initial Note (Addendum)
 Transition of Care [facility]) - Initial/Assessment Note    Patient Details  Name: Julie [name] MRN: [id number] Date of Birth: 06/09/1976  [facility]) CM/SW Contact:    Waddell Barnie Rama, RN Phone Number: 02/10/2024, 11:19 AM  Clinical Narrative:                 From home with spouse, has PCP and insurance on file, states has no HH services in place at this time or DME at home.  States family member will transport them home at [employer] and family is support system, states gets medications from [facility] on [address].  Pta self ambulatory.   There are no ICM  needs identified  at this time.  Please place consult for ICM  needs.  Patient was asking about some physical therapy, but when I was going to set It up for her she states she will get this herself because she goes from hotel to hotel, she did not want this NCM to set up HHPT.   Expected Discharge Plan: Home/Self Care Barriers to Discharge: Continued Medical Work up Patient Goals and CMS Choice Patient states their goals for this hospitalization and ongoing recovery are:: return to motel with friend   Choice offered to / list presented to : NA      Expected Discharge Plan and Services   Discharge Planning Services: CM Consult Post Acute Care Choice: NA Living arrangements for the past 2 months: Hotel/Motel                 DME Arranged: N/A DME Agency: NA       HH Arranged: NA          Prior Living Arrangements/Services Living arrangements for the past 2 months: Hotel/Motel Lives with:: Friends Patient language and need for interpreter reviewed:: Yes Do you feel safe going back to the place where you live?: Yes      Need for Family Participation in Patient Care: Yes (Comment) Care giver support system in place?: Yes (comment)   Criminal Activity/Legal Involvement Pertinent to Current Situation/Hospitalization: No - Comment as needed  Activities of Daily Living   ADL Screening (condition at time of  admission) Independently performs ADLs?: Yes (appropriate for developmental age) Is the patient deaf or have difficulty hearing?: No Does the patient have difficulty seeing, even when wearing glasses/contacts?: No Does the patient have difficulty concentrating, remembering, or making decisions?: No  Permission Sought/Granted Permission sought to share information with : Case Manager Permission granted to share information with : Yes, Verbal Permission Granted              Emotional Assessment   Attitude/Demeanor/Rapport: Engaged Affect (typically observed): Appropriate Orientation: : Oriented to Self, Oriented to Place, Oriented to  Time, Oriented to Situation Alcohol  / Substance Use: Not Applicable Psych Involvement: No (comment)  Admission diagnosis:  Acute pulmonary edema (HCC) [J81.0] Acute respiratory failure with hypoxia (HCC) [J96.01] Heart failure (HCC) [I50.9] Patient Active Problem List   Diagnosis Date Noted   Acute pulmonary edema (HCC) 02/08/2024   Hypertensive urgency 02/08/2024   Acute respiratory failure with hypoxia (HCC) 12/03/2023   Heart failure (HCC) 09/25/2023   Uncontrolled type 2 diabetes mellitus with hyperglycemia, without long-term current use of insulin  (HCC) 09/23/2023   Acute on chronic diastolic CHF (congestive heart failure) (HCC) 09/23/2023   Asthma, chronic 09/23/2023   Costochondritis 09/23/2023   Hypomagnesemia 09/23/2023   Acute pulmonary edema with congestive heart failure (HCC) 09/21/2023   Asthma, chronic,  unspecified asthma severity, with acute exacerbation 08/22/2023   Lower extremity edema 08/22/2023   Elevated brain natriuretic peptide (BNP) level 08/22/2023   Concern for new onset of CHF with exacerbation (congestive heart failure) (HCC) 08/22/2023   Hypokalemia 08/22/2023   Essential hypertension 08/22/2023   Elevated troponin 08/22/2023   Asthma exacerbation 08/22/2023   Continuous dependence on cigarette smoking 08/22/2023    Shortness of breath 08/22/2023   Cocaine use disorder, severe, dependence (HCC) 12/12/2016   PCP:  Freddrick, No Pharmacy:   [id number] - [place] - [address] [place] ([place]) [place] [address] Phone: [phone] Fax: [phone]     Social Drivers of Health (SDOH) Social History: SDOH Screenings   Food Insecurity: Food Insecurity Present (02/08/2024)  Housing: High Risk (02/08/2024)  Transportation Needs: Unmet Transportation Needs (02/08/2024)  Utilities: Not At Risk (02/08/2024)  Alcohol  Screen: Low Risk  (03/18/2023)  Depression (PHQ2-9): Medium Risk (03/05/2019)  Tobacco Use: High Risk (02/08/2024)   SDOH Interventions:     Readmission Risk Interventions    02/10/2024   11:16 AM 09/27/2023   12:14 PM  Readmission Risk Prevention Plan  Transportation Screening Complete Complete  PCP or Specialist Appt within 3-5 Days  Complete  HRI or Home Care Consult  Complete  Social Work Consult for Recovery Care Planning/Counseling  Complete  Palliative Care Screening  Not Applicable  Medication Review Oceanographer) Complete Complete  PCP or Specialist appointment within 3-5 days of discharge Complete   HRI or Home Care Consult Complete   Palliative Care Screening Not Applicable   Skilled Nursing Facility Not Applicable

## 2024-02-11 ENCOUNTER — Other Ambulatory Visit (HOSPITAL_COMMUNITY): Payer: Self-pay

## 2024-02-11 DIAGNOSIS — J9601 Acute respiratory failure with hypoxia: Secondary | ICD-10-CM | POA: Diagnosis not present

## 2024-02-11 LAB — BASIC METABOLIC PANEL WITH GFR
Anion gap: 11 (ref 5–15)
BUN: 17 mg/dL (ref 6–20)
CO2: 21 mmol/L — ABNORMAL LOW (ref 22–32)
Calcium: 10.5 mg/dL — ABNORMAL HIGH (ref 8.9–10.3)
Chloride: 105 mmol/L (ref 98–111)
Creatinine, Ser: 0.76 mg/dL (ref 0.44–1.00)
GFR, Estimated: >60 mL/min (ref >60)
Glucose, Bld: 164 mg/dL — ABNORMAL HIGH (ref 70–99)
Potassium: 3.9 mmol/L (ref 3.5–5.1)
Sodium: 137 mmol/L (ref 135–145)

## 2024-02-11 LAB — GLUCOSE, CAPILLARY: Glucose-Capillary: 195 mg/dL — ABNORMAL HIGH (ref 70–99)

## 2024-02-11 LAB — ALBUMIN: Albumin: 3.4 g/dL — ABNORMAL LOW (ref 3.5–5.0)

## 2024-02-11 MED ORDER — METFORMIN HCL 500 MG PO TABS
500.0000 mg | ORAL_TABLET | Freq: Two times a day (BID) | ORAL | 0 refills | Status: DC
  Filled 2024-02-11: qty 180, 90d supply, fill #0

## 2024-02-11 MED ORDER — POTASSIUM CHLORIDE CRYS ER 10 MEQ PO TBCR
20.0000 meq | EXTENDED_RELEASE_TABLET | Freq: Every day | ORAL | 0 refills | Status: AC
  Filled 2024-02-11: qty 60, 30d supply, fill #0

## 2024-02-11 MED ORDER — ALBUTEROL SULFATE HFA 108 (90 BASE) MCG/ACT IN AERS
1.0000 | INHALATION_SPRAY | Freq: Four times a day (QID) | RESPIRATORY_TRACT | 0 refills | Status: DC | PRN
  Filled 2024-02-11: qty 36, 50d supply, fill #0

## 2024-02-11 MED ORDER — LOSARTAN POTASSIUM 50 MG PO TABS
50.0000 mg | ORAL_TABLET | Freq: Every day | ORAL | 0 refills | Status: DC
  Filled 2024-02-11: qty 90, 90d supply, fill #0

## 2024-02-11 MED ORDER — AMLODIPINE BESYLATE 5 MG PO TABS
5.0000 mg | ORAL_TABLET | Freq: Every day | ORAL | 0 refills | Status: DC
  Filled 2024-02-11: qty 90, 90d supply, fill #0

## 2024-02-11 MED ORDER — FUROSEMIDE 40 MG PO TABS
40.0000 mg | ORAL_TABLET | Freq: Every day | ORAL | 0 refills | Status: DC
Start: 2024-02-11 — End: 2024-03-06
  Filled 2024-02-11: qty 30, 30d supply, fill #0

## 2024-02-11 MED ORDER — DAPAGLIFLOZIN PROPANEDIOL 10 MG PO TABS
10.0000 mg | ORAL_TABLET | Freq: Every day | ORAL | 0 refills | Status: AC
  Filled 2024-02-11: qty 30, 30d supply, fill #0

## 2024-02-11 MED ORDER — UMECLIDINIUM-VILANTEROL 62.5-25 MCG/ACT IN AEPB
1.0000 | INHALATION_SPRAY | Freq: Every day | RESPIRATORY_TRACT | 0 refills | Status: DC
  Filled 2024-02-11: qty 60, 30d supply, fill #0

## 2024-02-11 NOTE — Plan of Care (Signed)
   Problem: Clinical Measurements: Goal: Ability to maintain clinical measurements within normal limits will improve Outcome: Progressing Goal: Will remain free from infection Outcome: Progressing Goal: Respiratory complications will improve Outcome: Progressing Goal: Cardiovascular complication will be avoided Outcome: Progressing   Problem: Activity: Goal: Risk for activity intolerance will decrease Outcome: Progressing
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
## 2024-02-12 ENCOUNTER — Emergency Department (HOSPITAL_COMMUNITY)
Admission: EM | Admit: 2024-02-12 | Discharge: 2024-02-12 | Disposition: A | Discharge status: Home / Self Care | Payer: MEDICAID | Attending: Emergency Medicine | Admitting: Emergency Medicine

## 2024-02-12 ENCOUNTER — Emergency Department (HOSPITAL_COMMUNITY): Payer: MEDICAID

## 2024-02-12 ENCOUNTER — Encounter (HOSPITAL_COMMUNITY): Payer: Self-pay | Admitting: Emergency Medicine

## 2024-02-12 ENCOUNTER — Other Ambulatory Visit: Payer: Self-pay

## 2024-02-12 DIAGNOSIS — R42 Dizziness and giddiness: Secondary | ICD-10-CM | POA: Insufficient documentation

## 2024-02-12 DIAGNOSIS — Z79899 Other long term (current) drug therapy: Secondary | ICD-10-CM | POA: Insufficient documentation

## 2024-02-12 DIAGNOSIS — I509 Heart failure, unspecified: Secondary | ICD-10-CM | POA: Diagnosis not present

## 2024-02-12 DIAGNOSIS — E119 Type 2 diabetes mellitus without complications: Secondary | ICD-10-CM | POA: Insufficient documentation

## 2024-02-12 DIAGNOSIS — I110 Hypertensive heart disease with heart failure: Secondary | ICD-10-CM | POA: Insufficient documentation

## 2024-02-12 DIAGNOSIS — R0602 Shortness of breath: Secondary | ICD-10-CM | POA: Diagnosis not present

## 2024-02-12 DIAGNOSIS — R002 Palpitations: Secondary | ICD-10-CM | POA: Insufficient documentation

## 2024-02-12 DIAGNOSIS — J45909 Unspecified asthma, uncomplicated: Secondary | ICD-10-CM | POA: Diagnosis not present

## 2024-02-12 DIAGNOSIS — Z7984 Long term (current) use of oral hypoglycemic drugs: Secondary | ICD-10-CM | POA: Diagnosis not present
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
## 2024-02-12 NOTE — ED Notes (Signed)
 Patient stated she wasn't seen in a timely manner and wanted to be discharged. PA made aware. Patient left prior to receiving paper work & encouraged to come back if symptoms persist.

## 2024-02-12 NOTE — ED Provider Notes (Signed)
 [place] EMERGENCY DEPARTMENT AT [facility] Provider Note   CSN: [id number] Arrival date & time: 02/12/24  1506     Patient presents with: Dizziness, Shortness of Breath, and Palpitations   Julie [name] is a 48 y.o. female with past medical history significant for cocaine use, obesity, asthma, CHF, hypertension, diabetes who presents with EMS for shortness of breath, dizziness, palpitations.  Patient recently admitted for heart failure exacerbation.  She was just discharged yesterday.  Reports that she was standing outside, was unsure whether she may have just been exposed to too much heat.  Reports that she is overall feeling improved at this time other than still having some heart palpitations.    Dizziness Associated symptoms: palpitations and shortness of breath   Shortness of Breath Palpitations Associated symptoms: dizziness and shortness of breath        Prior to Admission medications   Medication Sig Start Date End Date Taking? Authorizing Provider  albuterol  (VENTOLIN  HFA) 108 (90 Base) MCG/ACT inhaler Inhale 1-2 puffs into the lungs every 6 (six) hours as needed for wheezing or shortness of breath. 02/11/24   Perri DELENA Meliton Mickey., MD  amLODipine  (NORVASC ) 5 MG tablet Take 1 tablet (5 mg total) by mouth daily. 02/11/24 05/11/24  Perri DELENA Meliton Mickey., MD  dapagliflozin  propanediol (FARXIGA ) 10 MG TABS tablet Take 1 tablet (10 mg total) by mouth daily before breakfast. 02/11/24 03/12/24  Perri DELENA Meliton Mickey., MD  furosemide  (LASIX ) 40 MG tablet Take 1 tablet (40 mg total) by mouth daily. Follow up with your PCP for repeat labs within 1 week.  Follow up with PCP for refills. 02/11/24 03/12/24  Perri DELENA Meliton Mickey., MD  losartan  (COZAAR ) 50 MG tablet Take 1 tablet (50 mg total) by mouth daily. 02/11/24 05/11/24  Perri DELENA Meliton Mickey., MD  metFORMIN  (GLUCOPHAGE ) 500 MG tablet Take 1 tablet (500 mg total) by mouth 2 (two) times daily with a meal. (Titrate based on  discharge instructions) 02/11/24   Perri DELENA Meliton Mickey., MD  potassium chloride  (KLOR-CON  M) 10 MEQ tablet Take 2 tablets (20 mEq total) by mouth daily. Follow with your PCP for repeat labs in 1 week 02/11/24 03/12/24  Perri DELENA Meliton Mickey., MD  umeclidinium-vilanterol (ANORO ELLIPTA ) 62.5-25 MCG/ACT AEPB Inhale 1 puff into the lungs daily. 02/11/24   Perri DELENA Meliton Mickey., MD    Allergies: Patient has no known allergies.    Review of Systems  Respiratory:  Positive for shortness of breath.   Cardiovascular:  Positive for palpitations.  Neurological:  Positive for dizziness.  All other systems reviewed and are negative.   Updated Vital Signs LMP  (LMP Unknown) Comment: pt reports last period in July but unsure of exact date; states she has irregular periods  Physical Exam Vitals and nursing note reviewed.  Constitutional:      General: She is not in acute distress.    Appearance: Normal appearance.  HENT:     Head: Normocephalic and atraumatic.  Eyes:     General:        Right eye: No discharge.        Left eye: No discharge.  Cardiovascular:     Rate and Rhythm: Normal rate and regular rhythm.     Heart sounds: No murmur heard.    No friction rub. No gallop.  Pulmonary:     Effort: Pulmonary effort is normal.     Breath sounds: Normal breath sounds.     Comments: No  wheezing, rhonchi, stridor, rales, no significant crackles at lung bases Abdominal:     General: Bowel sounds are normal.     Palpations: Abdomen is soft.  Skin:    General: Skin is warm and dry.     Capillary Refill: Capillary refill takes less than 2 seconds.  Neurological:     Mental Status: She is alert and oriented to person, place, and time.  Psychiatric:        Mood and Affect: Mood normal.        Behavior: Behavior normal.     (all labs ordered are listed, but only abnormal results are displayed) Labs Reviewed - No data to display  EKG: None  Radiology: No results found.   Procedures    Medications Ordered in the ED - No data to display                                  Medical Decision Making  This patient is a 48 y.o. female  who presents to the ED for concern of shob, palpitations.   Differential diagnoses prior to evaluation: The emergent differential diagnosis includes, but is not limited to,  asthma exacerbation, COPD exacerbation, acute upper respiratory infection, acute bronchitis, chronic bronchitis, interstitial lung disease, ARDS, PE, pneumonia, atypical ACS, carbon monoxide poisoning, spontaneous pneumothorax, new CHF vs CHF exacerbation, versus other, unstable tachydysrhythmia, unstable bradycardia, sick sinus syndrome, heart block or other abnormal electrophysiologic abnormality including WPW, LGL, brugada, SVT, afib, a flutter, vs other  . This is not an exhaustive differential.   Past Medical History / Co-morbidities / Social History: cocaine use, obesity, asthma, CHF, hypertension, diabetes  Additional history: Chart reviewed. Pertinent results include: Extensively reviewed lab work, imaging from patient's recent hospitalization, was discharged just yesterday morning.  Physical Exam: Physical exam performed. The pertinent findings include: No wheezing, rhonchi, stridor, rales, no significant crackles at lung bases  Vital signs stable other than some hypertension, blood pressure 152/94.  Stable oxygen saturation on room air. Patient has been in the emergency department for around 30 minutes, she complains that she is being treated inappropriately,/not being taken care of because she is homeless, she declines any testing at this time and requests discharge.  Given her stable vital signs, recent hospitalization with discharge I think it is reasonable for patient to leave the emergency department at this time.  However discussed that I cannot comment on what may have caused her shortness of breath, lightheadedness today.   Disposition: After consideration  of the diagnostic results and the patients response to treatment, I feel that patient stable for discharge without further workup, encouraged to return if she would like to complete care.   emergency department workup was not completed, but given her recent evaluation, discharge, stable vital signs other than some hypertension, I think she is reasonable to decline further workup today.   Final diagnoses:  None    ED Discharge Orders     None          Rosan Sherlean VEAR DEVONNA 02/12/24 1554    Cottie Donnice PARAS, MD 02/12/24 [phone]

## 2024-02-12 NOTE — Discharge Instructions (Signed)
 As we discussed you did not complete your care today, your vital signs are overall stable in the emergency department other than blood pressure being elevated at 152/94.  I cannot comment on what may have been causing your dizziness and lightheadedness other than your known history of asthma and heart failure, if you wish to complete your care please return for further evaluation.  Otherwise continue take your home medications, and follow-up closely with your doctors.

## 2024-02-28 ENCOUNTER — Emergency Department (HOSPITAL_COMMUNITY)
Admission: EM | Admit: 2024-02-28 | Discharge: 2024-02-29 | Disposition: A | Discharge status: Home / Self Care | Payer: MEDICAID | Attending: Emergency Medicine | Admitting: Emergency Medicine

## 2024-02-28 ENCOUNTER — Other Ambulatory Visit: Payer: Self-pay

## 2024-02-28 DIAGNOSIS — S60562A Insect bite (nonvenomous) of left hand, initial encounter: Secondary | ICD-10-CM | POA: Insufficient documentation

## 2024-02-28 DIAGNOSIS — W57XXXA Bitten or stung by nonvenomous insect and other nonvenomous arthropods, initial encounter: Secondary | ICD-10-CM | POA: Diagnosis not present

## 2024-02-28 DIAGNOSIS — R2232 Localized swelling, mass and lump, left upper limb: Secondary | ICD-10-CM | POA: Diagnosis present

## 2024-02-28 DIAGNOSIS — J45909 Unspecified asthma, uncomplicated: Secondary | ICD-10-CM | POA: Diagnosis not present
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
## 2024-02-29 MED ORDER — DIPHENHYDRAMINE HCL 25 MG PO CAPS
25.0000 mg | ORAL_CAPSULE | Freq: Once | ORAL | Status: AC
  Administered 2024-02-29: 25 mg via ORAL
  Filled 2024-02-29: qty 1

## 2024-02-29 MED ORDER — DEXAMETHASONE 4 MG PO TABS
6.0000 mg | ORAL_TABLET | Freq: Once | ORAL | Status: AC
  Administered 2024-02-29: 6 mg via ORAL
  Filled 2024-02-29: qty 2

## 2024-02-29 NOTE — Discharge Instructions (Signed)
###   Yellow Jacket Sting Care     A yellow jacket sting can cause pain, swelling, and redness where the sting happened. Sometimes, the swelling can get bigger over the first day or two and last for several days. This is called a large local reaction. Most of the time, these reactions are not dangerous and will get better on their own.      **What Was Done in the Emergency Department:**      - You were given medicine by mouth to help with swelling (dexamethasone , a steroid) and itching (diphenhydramine , an antihistamine).      **How to Care for Your Hand at Home:**      - **Ice:** Put a cold pack or ice wrapped in a towel on the sting for 15-20 minutes every few hours to help with pain and swelling.      - **Keep It Up:** Try to keep your hand raised above your heart as much as you can. This helps reduce swelling.      - **Pain Relief:** You can take acetaminophen  (Tylenol ) or ibuprofen  (Advil , Motrin ) if you have pain. Follow the directions on the package.      - **Itching:** You may keep taking an antihistamine like diphenhydramine  (Benadryl ) or a non-drowsy option if you need it for itching.      - **Steroid Medicine:** If you were given a prescription for dexamethasone  or another steroid, take it as directed until it is finished.      - **Keep It Clean:** Wash the area gently with soap and water. Do not scratch the sting, as this can cause infection.      **What to Watch For:**      - **Infection:** Call your doctor if you notice more redness, warmth, swelling that keeps getting worse, pus, fever, or chills.      - **Allergic Reaction:** Go to the emergency room right away if you have:      - Trouble breathing or swallowing      - Swelling of your face, lips, tongue, or throat      - Dizziness or fainting      - Hives or rash all over your body      - Nausea, vomiting, or stomach pain      **When to Expect Improvement:**      - Swelling and redness may get worse for the  first 1-2 days, then slowly get better over the next several days.      **Prevention Tips:**      - Be careful outdoors, especially around trash cans or food, where yellow jackets are common.      - Wear long sleeves, pants, and closed shoes when outside.      - If you get stung again and have a more serious reaction, get medical help right away.      **Other Important Information:**      - Antibiotics are not needed unless there are signs of infection.      - You do not need an epinephrine  (EpiPen ) unless you have had a serious allergic reaction in the past.      If you have any questions or your symptoms get worse, contact your doctor or return to the emergency department.

## 2024-02-29 NOTE — ED Provider Notes (Signed)
 [place] EMERGENCY DEPARTMENT AT [facility] Provider Note   CSN: [id number] Arrival date & time: 02/28/24  2315     Patient presents with: Insect Bite (Yellow Jacket )   Keylin I Sgroi is a 48 y.o. female.  Patient presents to the emergency room complaining of left hand swelling secondary to yellowjacket sting.  Past medical history significant for asthma.  Patient denies any shortness of breath, mouth or tongue swelling, difficulty swallowing.  Patient is right-hand dominant   HPI     Prior to Admission medications   Medication Sig Start Date End Date Taking? Authorizing Provider  albuterol  (VENTOLIN  HFA) 108 (90 Base) MCG/ACT inhaler Inhale 1-2 puffs into the lungs every 6 (six) hours as needed for wheezing or shortness of breath. 02/11/24   Perri DELENA Meliton Mickey., MD  amLODipine  (NORVASC ) 5 MG tablet Take 1 tablet (5 mg total) by mouth daily. 02/11/24 05/11/24  Perri DELENA Meliton Mickey., MD  dapagliflozin  propanediol (FARXIGA ) 10 MG TABS tablet Take 1 tablet (10 mg total) by mouth daily before breakfast. 02/11/24 03/12/24  Perri DELENA Meliton Mickey., MD  furosemide  (LASIX ) 40 MG tablet Take 1 tablet (40 mg total) by mouth daily. Follow up with your PCP for repeat labs within 1 week.  Follow up with PCP for refills. 02/11/24 03/12/24  Perri DELENA Meliton Mickey., MD  losartan  (COZAAR ) 50 MG tablet Take 1 tablet (50 mg total) by mouth daily. 02/11/24 05/11/24  Perri DELENA Meliton Mickey., MD  metFORMIN  (GLUCOPHAGE ) 500 MG tablet Take 1 tablet (500 mg total) by mouth 2 (two) times daily with a meal. (Titrate based on discharge instructions) 02/11/24   Perri DELENA Meliton Mickey., MD  potassium chloride  (KLOR-CON  M) 10 MEQ tablet Take 2 tablets (20 mEq total) by mouth daily. Follow with your PCP for repeat labs in 1 week 02/11/24 03/12/24  Perri DELENA Meliton Mickey., MD  umeclidinium-vilanterol (ANORO ELLIPTA ) 62.5-25 MCG/ACT AEPB Inhale 1 puff into the lungs daily. 02/11/24   Perri DELENA Meliton Mickey., MD     Allergies: Patient has no known allergies.    Review of Systems  Updated Vital Signs BP (!) 150/79 (BP Location: Left Arm)   Pulse 95   Temp 98.3 F (36.8 C)   Resp 20   LMP  (LMP Unknown) Comment: pt reports last period in July but unsure of exact date; states she has irregular periods  SpO2 100%   Physical Exam Vitals and nursing note reviewed.  HENT:     Head: Normocephalic and atraumatic.  Eyes:     Pupils: Pupils are equal, round, and reactive to light.  Pulmonary:     Effort: Pulmonary effort is normal. No respiratory distress.  Musculoskeletal:        General: Swelling present. No signs of injury.     Cervical back: Normal range of motion.     Comments: Diffuse swelling to left hand  Skin:    General: Skin is dry.  Neurological:     Mental Status: She is alert.  Psychiatric:        Speech: Speech normal.        Behavior: Behavior normal.     (all labs ordered are listed, but only abnormal results are displayed) Labs Reviewed - No data to display  EKG: None  Radiology: No results found.   Procedures   Medications Ordered in the ED  dexamethasone  (DECADRON ) tablet 6 mg (has no administration in time range)  diphenhydrAMINE  (BENADRYL ) capsule 25 mg (25 mg  Oral Given 02/29/24 0330)                                    Medical Decision Making  Patient presents with a chief complaint of left hand swelling secondary to a yellowjacket sting/bite.  She denies any complaints of shortness of breath or other systemic symptoms at this time.  I ordered medication including Decadron  and Benadryl .  Upon reassessment the patient had improved.  No indication for further emergent workup or admission.  Patient appears stable for discharge home.  Home care instructions provided.  Return precautions provided.     Final diagnoses:  Insect bite of left hand, initial encounter    ED Discharge Orders     None          Logan Ubaldo KATHEE DEVONNA 02/29/24  0330    Midge Golas, MD 02/29/24 [id number]

## 2024-03-01 ENCOUNTER — Emergency Department (HOSPITAL_COMMUNITY)
Admission: EM | Admit: 2024-03-01 | Discharge: 2024-03-02 | Disposition: A | Discharge status: Home / Self Care | Payer: MEDICAID | Attending: Emergency Medicine | Admitting: Emergency Medicine

## 2024-03-01 ENCOUNTER — Emergency Department (HOSPITAL_COMMUNITY): Payer: MEDICAID

## 2024-03-01 ENCOUNTER — Encounter (HOSPITAL_COMMUNITY): Payer: Self-pay | Admitting: *Deleted

## 2024-03-01 ENCOUNTER — Other Ambulatory Visit: Payer: Self-pay

## 2024-03-01 DIAGNOSIS — Z79899 Other long term (current) drug therapy: Secondary | ICD-10-CM | POA: Diagnosis not present

## 2024-03-01 DIAGNOSIS — I110 Hypertensive heart disease with heart failure: Secondary | ICD-10-CM | POA: Diagnosis not present

## 2024-03-01 DIAGNOSIS — I509 Heart failure, unspecified: Secondary | ICD-10-CM | POA: Diagnosis not present

## 2024-03-01 DIAGNOSIS — F329 Major depressive disorder, single episode, unspecified: Secondary | ICD-10-CM | POA: Diagnosis not present

## 2024-03-01 DIAGNOSIS — E119 Type 2 diabetes mellitus without complications: Secondary | ICD-10-CM | POA: Insufficient documentation

## 2024-03-01 DIAGNOSIS — R45851 Suicidal ideations: Secondary | ICD-10-CM | POA: Insufficient documentation

## 2024-03-01 DIAGNOSIS — R45850 Homicidal ideations: Secondary | ICD-10-CM | POA: Diagnosis not present

## 2024-03-01 DIAGNOSIS — F29 Unspecified psychosis not due to a substance or known physiological condition: Secondary | ICD-10-CM | POA: Insufficient documentation

## 2024-03-01 DIAGNOSIS — K029 Dental caries, unspecified: Secondary | ICD-10-CM | POA: Insufficient documentation

## 2024-03-01 DIAGNOSIS — R456 Violent behavior: Secondary | ICD-10-CM | POA: Diagnosis present

## 2024-03-01 DIAGNOSIS — Z7984 Long term (current) use of oral hypoglycemic drugs: Secondary | ICD-10-CM | POA: Diagnosis not present

## 2024-03-01 NOTE — ED Triage Notes (Signed)
 Pt c/o fluid overload. Reports lower abd pain and centralized chest pain with sob. Pitting edema noted to BLE. Also reports increased fluid intake today in hopes of drowning myself, just to end it all. Expresses SI (stressors including homelessness) Crack use ('not enough to amount to anything) denies ETOH

## 2024-03-02 ENCOUNTER — Encounter (HOSPITAL_COMMUNITY): Payer: Self-pay | Admitting: Psychiatry

## 2024-03-02 ENCOUNTER — Inpatient Hospital Stay (HOSPITAL_COMMUNITY)
Admission: AD | Admit: 2024-03-02 | Discharge: 2024-03-06 | DRG: 885 | Disposition: A | Discharge status: Home / Self Care | Payer: MEDICAID | Source: Intra-hospital

## 2024-03-02 ENCOUNTER — Emergency Department (HOSPITAL_COMMUNITY): Payer: MEDICAID

## 2024-03-02 DIAGNOSIS — Z5948 Other specified lack of adequate food: Secondary | ICD-10-CM | POA: Diagnosis not present

## 2024-03-02 DIAGNOSIS — E669 Obesity, unspecified: Secondary | ICD-10-CM | POA: Diagnosis present

## 2024-03-02 DIAGNOSIS — F1410 Cocaine abuse, uncomplicated: Secondary | ICD-10-CM | POA: Diagnosis present

## 2024-03-02 DIAGNOSIS — F17210 Nicotine dependence, cigarettes, uncomplicated: Secondary | ICD-10-CM | POA: Diagnosis present

## 2024-03-02 DIAGNOSIS — Z5982 Transportation insecurity: Secondary | ICD-10-CM

## 2024-03-02 DIAGNOSIS — I110 Hypertensive heart disease with heart failure: Secondary | ICD-10-CM | POA: Diagnosis present

## 2024-03-02 DIAGNOSIS — Z9152 Personal history of nonsuicidal self-harm: Secondary | ICD-10-CM | POA: Diagnosis not present

## 2024-03-02 DIAGNOSIS — Z560 Unemployment, unspecified: Secondary | ICD-10-CM

## 2024-03-02 DIAGNOSIS — K047 Periapical abscess without sinus: Secondary | ICD-10-CM | POA: Diagnosis present

## 2024-03-02 DIAGNOSIS — Z6832 Body mass index (BMI) 32.0-32.9, adult: Secondary | ICD-10-CM | POA: Diagnosis not present

## 2024-03-02 DIAGNOSIS — I509 Heart failure, unspecified: Secondary | ICD-10-CM | POA: Diagnosis present

## 2024-03-02 DIAGNOSIS — F4310 Post-traumatic stress disorder, unspecified: Secondary | ICD-10-CM | POA: Diagnosis present

## 2024-03-02 DIAGNOSIS — F329 Major depressive disorder, single episode, unspecified: Secondary | ICD-10-CM | POA: Diagnosis not present

## 2024-03-02 DIAGNOSIS — E039 Hypothyroidism, unspecified: Secondary | ICD-10-CM | POA: Diagnosis present

## 2024-03-02 DIAGNOSIS — F411 Generalized anxiety disorder: Secondary | ICD-10-CM | POA: Diagnosis present

## 2024-03-02 DIAGNOSIS — Z7984 Long term (current) use of oral hypoglycemic drugs: Secondary | ICD-10-CM | POA: Diagnosis not present

## 2024-03-02 DIAGNOSIS — Z5900 Homelessness unspecified: Secondary | ICD-10-CM

## 2024-03-02 DIAGNOSIS — Z5941 Food insecurity: Secondary | ICD-10-CM | POA: Diagnosis not present

## 2024-03-02 DIAGNOSIS — Z9151 Personal history of suicidal behavior: Secondary | ICD-10-CM

## 2024-03-02 DIAGNOSIS — E119 Type 2 diabetes mellitus without complications: Secondary | ICD-10-CM | POA: Diagnosis present

## 2024-03-02 DIAGNOSIS — Z7951 Long term (current) use of inhaled steroids: Secondary | ICD-10-CM | POA: Diagnosis not present

## 2024-03-02 DIAGNOSIS — F332 Major depressive disorder, recurrent severe without psychotic features: Principal | ICD-10-CM | POA: Diagnosis present

## 2024-03-02 DIAGNOSIS — Z79899 Other long term (current) drug therapy: Secondary | ICD-10-CM

## 2024-03-02 DIAGNOSIS — F1510 Other stimulant abuse, uncomplicated: Secondary | ICD-10-CM | POA: Diagnosis present

## 2024-03-02 DIAGNOSIS — Z716 Tobacco abuse counseling: Secondary | ICD-10-CM | POA: Diagnosis not present

## 2024-03-02 DIAGNOSIS — Z818 Family history of other mental and behavioral disorders: Secondary | ICD-10-CM

## 2024-03-02 LAB — CBC
HCT: 41.1 % (ref 36.0–46.0)
Hemoglobin: 12.8 g/dL (ref 12.0–15.0)
MCH: 26.8 pg (ref 26.0–34.0)
MCHC: 31.1 g/dL (ref 30.0–36.0)
MCV: 86.2 fL (ref 80.0–100.0)
Platelets: 188 K/uL (ref 150–400)
RBC: 4.77 MIL/uL (ref 3.87–5.11)
RDW: 14.6 % (ref 11.5–15.5)
WBC: 7.3 K/uL (ref 4.0–10.5)
nRBC: 0 % (ref 0.0–0.2)

## 2024-03-02 LAB — BASIC METABOLIC PANEL WITH GFR
Anion gap: 12 (ref 5–15)
BUN: 5 mg/dL — ABNORMAL LOW (ref 6–20)
CO2: 21 mmol/L — ABNORMAL LOW (ref 22–32)
Calcium: 10.1 mg/dL (ref 8.9–10.3)
Chloride: 108 mmol/L (ref 98–111)
Creatinine, Ser: 0.84 mg/dL (ref 0.44–1.00)
GFR, Estimated: >60 mL/min (ref >60)
Glucose, Bld: 241 mg/dL — ABNORMAL HIGH (ref 70–99)
Potassium: 3.4 mmol/L — ABNORMAL LOW (ref 3.5–5.1)
Sodium: 141 mmol/L (ref 135–145)

## 2024-03-02 LAB — RAPID URINE DRUG SCREEN, HOSP PERFORMED
Amphetamines: NOT DETECTED
Barbiturates: NOT DETECTED
Benzodiazepines: NOT DETECTED
Cocaine: POSITIVE — AB
Opiates: NOT DETECTED
Tetrahydrocannabinol: NOT DETECTED

## 2024-03-02 LAB — HEPATIC FUNCTION PANEL
ALT: 14 U/L (ref 0–44)
AST: 21 U/L (ref 15–41)
Albumin: 3.5 g/dL (ref 3.5–5.0)
Alkaline Phosphatase: 47 U/L (ref 38–126)
Bilirubin, Direct: 0.1 mg/dL (ref 0.0–0.2)
Indirect Bilirubin: 0.2 mg/dL — ABNORMAL LOW (ref 0.3–0.9)
Total Bilirubin: 0.3 mg/dL (ref 0.0–1.2)
Total Protein: 6.8 g/dL (ref 6.5–8.1)

## 2024-03-02 LAB — HCG, SERUM, QUALITATIVE: Preg, Serum: NEGATIVE

## 2024-03-02 LAB — TROPONIN I (HIGH SENSITIVITY)
Troponin I (High Sensitivity): 28 ng/L — ABNORMAL HIGH (ref <18)
Troponin I (High Sensitivity): 28 ng/L — ABNORMAL HIGH (ref <18)

## 2024-03-02 LAB — BRAIN NATRIURETIC PEPTIDE: B Natriuretic Peptide: 393.3 pg/mL — ABNORMAL HIGH (ref 0.0–100.0)

## 2024-03-02 LAB — ETHANOL: Alcohol, Ethyl (B): <15 mg/dL (ref <15)

## 2024-03-02 MED ORDER — ALUM & MAG HYDROXIDE-SIMETH 200-200-20 MG/5ML PO SUSP: 30.0000 mL | ORAL | Status: DC | PRN

## 2024-03-02 MED ORDER — LORAZEPAM 2 MG/ML IJ SOLN: 2.0000 mg | Freq: Three times a day (TID) | INTRAMUSCULAR | Status: DC | PRN

## 2024-03-02 MED ORDER — HALOPERIDOL LACTATE 5 MG/ML IJ SOLN: 5.0000 mg | Freq: Three times a day (TID) | INTRAMUSCULAR | Status: DC | PRN

## 2024-03-02 MED ORDER — TRAZODONE HCL 50 MG PO TABS
50.0000 mg | ORAL_TABLET | Freq: Every evening | ORAL | Status: DC | PRN
  Administered 2024-03-02 – 2024-03-05 (×4): 50 mg via ORAL
  Filled 2024-03-02 (×4): qty 1

## 2024-03-02 MED ORDER — DIPHENHYDRAMINE HCL 25 MG PO CAPS: 50.0000 mg | ORAL_CAPSULE | Freq: Three times a day (TID) | ORAL | Status: DC | PRN

## 2024-03-02 MED ORDER — CLONIDINE HCL 0.1 MG PO TABS
0.1000 mg | ORAL_TABLET | Freq: Three times a day (TID) | ORAL | Status: DC | PRN
  Administered 2024-03-02 – 2024-03-06 (×6): 0.1 mg via ORAL
  Filled 2024-03-02 (×6): qty 1

## 2024-03-02 MED ORDER — AMOXICILLIN-POT CLAVULANATE 875-125 MG PO TABS
1.0000 | ORAL_TABLET | Freq: Once | ORAL | Status: AC
  Administered 2024-03-02: 1 via ORAL
  Filled 2024-03-02: qty 1

## 2024-03-02 MED ORDER — DIPHENHYDRAMINE HCL 50 MG/ML IJ SOLN: 50.0000 mg | Freq: Three times a day (TID) | INTRAMUSCULAR | Status: DC | PRN

## 2024-03-02 MED ORDER — NICOTINE 21 MG/24HR TD PT24
21.0000 mg | MEDICATED_PATCH | Freq: Every day | TRANSDERMAL | Status: DC
  Filled 2024-03-02 (×3): qty 1

## 2024-03-02 MED ORDER — HALOPERIDOL LACTATE 5 MG/ML IJ SOLN: 10.0000 mg | Freq: Three times a day (TID) | INTRAMUSCULAR | Status: DC | PRN

## 2024-03-02 MED ORDER — HYDROXYZINE HCL 25 MG PO TABS
25.0000 mg | ORAL_TABLET | Freq: Once | ORAL | Status: AC | PRN
  Administered 2024-03-02: 25 mg via ORAL
  Filled 2024-03-02: qty 1

## 2024-03-02 MED ORDER — MAGNESIUM HYDROXIDE 400 MG/5ML PO SUSP: 30.0000 mL | Freq: Every day | ORAL | Status: DC | PRN

## 2024-03-02 MED ORDER — CLONIDINE HCL 0.1 MG PO TABS
0.1000 mg | ORAL_TABLET | Freq: Once | ORAL | Status: AC
  Administered 2024-03-02: 0.1 mg via ORAL
  Filled 2024-03-02: qty 1

## 2024-03-02 MED ORDER — IBUPROFEN 400 MG PO TABS
600.0000 mg | ORAL_TABLET | Freq: Once | ORAL | Status: AC
  Administered 2024-03-02: 600 mg via ORAL
  Filled 2024-03-02: qty 1

## 2024-03-02 MED ORDER — HALOPERIDOL 5 MG PO TABS: 5.0000 mg | ORAL_TABLET | Freq: Three times a day (TID) | ORAL | Status: DC | PRN

## 2024-03-02 MED ORDER — ACETAMINOPHEN 325 MG PO TABS
650.0000 mg | ORAL_TABLET | Freq: Four times a day (QID) | ORAL | Status: DC | PRN
  Administered 2024-03-02 – 2024-03-05 (×8): 650 mg via ORAL
  Filled 2024-03-02 (×8): qty 2

## 2024-03-02 MED ORDER — ALBUTEROL SULFATE HFA 108 (90 BASE) MCG/ACT IN AERS
1.0000 | INHALATION_SPRAY | Freq: Four times a day (QID) | RESPIRATORY_TRACT | Status: DC | PRN
Start: 2024-03-02 — End: 2024-03-06
  Administered 2024-03-02 – 2024-03-05 (×2): 2 via RESPIRATORY_TRACT
  Filled 2024-03-02: qty 8
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
## 2024-03-02 NOTE — ED Notes (Signed)
 TTS in process

## 2024-03-02 NOTE — ED Notes (Signed)
 When asked if pt had plan for self harm pt stated I can't tell you that, that's exposing me too much

## 2024-03-02 NOTE — Plan of Care (Signed)
   Problem: Education: Goal: Emotional status will improve Outcome: Progressing Goal: Mental status will improve Outcome: Progressing Goal: Verbalization of understanding the information provided will improve Outcome: Progressing   Problem: Activity: Goal: Interest or engagement in activities will improve Outcome: Progressing

## 2024-03-02 NOTE — Group Note (Signed)
 Date:  03/02/2024 Time:  4:32 PM  Group Topic/Focus:  Overcoming Stress:   The focus of this group is to define stress and help patients assess their triggers.    Participation Level:  Did Not Attend   Annalee Larch 03/02/2024, 4:32 PM

## 2024-03-02 NOTE — ED Notes (Signed)
 Attempted to dress patient out; pt refused to dress out in paper scrubs
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
## 2024-03-02 NOTE — ED Notes (Signed)
 Upon offering paper scrubs and asking pt to dress out, pt refused. RN Notified
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
## 2024-03-02 NOTE — ED Provider Notes (Signed)
 [place] EMERGENCY DEPARTMENT AT [facility] Provider Note   CSN: [id number] Arrival date & time: 03/01/24  2340     Patient presents with: Abdominal Pain, Chest Pain, and Suicidal  Julie [name] is a 48 y.o. female who presents with concern for SI and HI.  She endorses feelings of hopelessness and intense anger toward her partner who is present with her in triage in the emergency department.  She endorses history of homelessness and CHF.  States that because she did not feel she could kill her partner she decided to try to kill herself.  She states that she drank for more fluids than normal trying to drown myself, just ended all.  Patient endorsed temporary chest pain now resolved.  Also endorsing right upper dental pain over the last few days that is worsening.  History of cocaine use, ongoing per patient.  Endorses history of hypertension, CHF with EF of 55 to 60% in March 2025, type 2 diabetes and history of mental health challenges.   HPI     Prior to Admission medications   Medication Sig Start Date End Date Taking? Authorizing Provider  albuterol  (VENTOLIN  HFA) 108 (90 Base) MCG/ACT inhaler Inhale 1-2 puffs into the lungs every 6 (six) hours as needed for wheezing or shortness of breath. 02/11/24  Yes Perri DELENA Meliton Mickey., MD  amLODipine  (NORVASC ) 5 MG tablet Take 1 tablet (5 mg total) by mouth daily. 02/11/24 05/11/24 Yes Perri DELENA Meliton Mickey., MD  dapagliflozin  propanediol (FARXIGA ) 10 MG TABS tablet Take 1 tablet (10 mg total) by mouth daily before breakfast. 02/11/24 03/12/24 Yes Perri DELENA Meliton Mickey., MD  furosemide  (LASIX ) 40 MG tablet Take 1 tablet (40 mg total) by mouth daily. Follow up with your PCP for repeat labs within 1 week.  Follow up with PCP for refills. 02/11/24 03/12/24 Yes Perri DELENA Meliton Mickey., MD  losartan  (COZAAR ) 50 MG tablet Take 1 tablet (50 mg total) by mouth daily. 02/11/24 05/11/24 Yes Perri DELENA Meliton Mickey., MD  metFORMIN  (GLUCOPHAGE ) 500 MG  tablet Take 1 tablet (500 mg total) by mouth 2 (two) times daily with a meal. (Titrate based on discharge instructions) 02/11/24  Yes Perri DELENA Meliton Mickey., MD  potassium chloride  (KLOR-CON  M) 10 MEQ tablet Take 2 tablets (20 mEq total) by mouth daily. Follow with your PCP for repeat labs in 1 week 02/11/24 03/12/24 Yes Perri DELENA Meliton Mickey., MD  umeclidinium-vilanterol (ANORO ELLIPTA ) 62.5-25 MCG/ACT AEPB Inhale 1 puff into the lungs daily. 02/11/24  Yes Perri DELENA Meliton Mickey., MD    Allergies: Patient has no known allergies.    Review of Systems  HENT:  Positive for dental problem.   Psychiatric/Behavioral:  Positive for suicidal ideas.     Updated Vital Signs BP (!) 157/83 (BP Location: Right Arm)   Pulse 87   Temp 99 F (37.2 C) (Oral)   Resp (!) 22   SpO2 98%   Physical Exam Vitals and nursing note reviewed.  Constitutional:      Appearance: She is obese. She is not ill-appearing or toxic-appearing.  HENT:     Head: Normocephalic and atraumatic.     Mouth/Throat:     Mouth: Mucous membranes are moist.     Dentition: Abnormal dentition. Dental tenderness and dental caries present.     Pharynx: Oropharynx is clear. Uvula midline. No oropharyngeal exudate or posterior oropharyngeal erythema.     Tonsils: No tonsillar exudate.   Eyes:     General:  Right eye: No discharge.        Left eye: No discharge.     Conjunctiva/sclera: Conjunctivae normal.  Cardiovascular:     Rate and Rhythm: Normal rate and regular rhythm.     Pulses: Normal pulses.  Pulmonary:     Effort: Pulmonary effort is normal. No respiratory distress.     Breath sounds: Normal breath sounds. No wheezing or rales.  Abdominal:     General: Bowel sounds are normal. There is no distension.     Palpations: Abdomen is soft.     Tenderness: There is no abdominal tenderness. There is no right CVA tenderness, left CVA tenderness, guarding or rebound.  Musculoskeletal:        General: No deformity.      Cervical back: Neck supple.  Skin:    General: Skin is warm and dry.     Capillary Refill: Capillary refill takes less than 2 seconds.  Neurological:     General: No focal deficit present.     Mental Status: She is alert and oriented to person, place, and time. Mental status is at baseline.  Psychiatric:        Attention and Perception: Attention normal.        Mood and Affect: Mood is depressed.        Speech: Speech normal.        Behavior: Behavior normal.        Thought Content: Thought content includes homicidal and suicidal ideation. Thought content includes suicidal plan.     Comments: Does not appear to be responding to internal stimuli at this time.      (all labs ordered are listed, but only abnormal results are displayed) Labs Reviewed  BASIC METABOLIC PANEL WITH GFR - Abnormal; Notable for the following components:      Result Value   Potassium 3.4 (*)    CO2 21 (*)    Glucose, Bld 241 (*)    BUN 5 (*)    All other components within normal limits  HEPATIC FUNCTION PANEL - Abnormal; Notable for the following components:   Indirect Bilirubin 0.2 (*)    All other components within normal limits  BRAIN NATRIURETIC PEPTIDE - Abnormal; Notable for the following components:   B Natriuretic Peptide 393.3 (*)    All other components within normal limits  TROPONIN I (HIGH SENSITIVITY) - Abnormal; Notable for the following components:   Troponin I (High Sensitivity) 28 (*)    All other components within normal limits  TROPONIN I (HIGH SENSITIVITY) - Abnormal; Notable for the following components:   Troponin I (High Sensitivity) 28 (*)    All other components within normal limits  CBC  HCG, SERUM, QUALITATIVE  ETHANOL  RAPID URINE DRUG SCREEN, HOSP PERFORMED    EKG: None  Radiology: DG Chest 2 View Result Date: 03/02/2024 EXAM: 2 VIEW(S) XRAY OF THE CHEST 03/02/2024 12:25:00 AM COMPARISON: 02/08/2024 CLINICAL HISTORY: Encounter for chest pain and shortness of breath.  FINDINGS: LUNGS AND PLEURA: Pulmonary Vascular Congestion. No focal pulmonary opacity. No pleural effusion. No pneumothorax. HEART AND MEDIASTINUM: Unchanged cardiomegaly. No acute abnormality of the cardiac and mediastinal silhouettes. BONES AND SOFT TISSUES: Unchanged elevation of right hemidiaphragm. No acute osseous abnormality. IMPRESSION: 1. Cardiomegaly and pulmonary vascular congestion similar to prior. Electronically signed by: Norman Gatlin MD 03/02/2024 12:29 AM EDT RP Workstation: [id number]     Procedures   Medications Ordered in the ED  ibuprofen  (ADVIL ) tablet 600 mg (600 mg Oral Given 03/02/24  9653)  amoxicillin -clavulanate (AUGMENTIN ) 875-125 MG per tablet 1 tablet (1 tablet Oral Given 03/02/24 0346)                                   Medical Decision Making 48 y/o with suicidality, homicidality.  Hypertensive on intake with vital signs otherwise normal.  Cardiopulmonary symptoms are well, abdominal exam is benign, no lower extremity edema, normal vascular status no extremities.  Patient with dental infection as above, frustrated and depressed affect, cooperative, does not appear to responding to internal stimuli but does endorse homicidality and suicidality, no plan for homicidality but plan to drown herself by worsening her heart failure/intent.  Amount and/or Complexity of Data Reviewed Labs: ordered.    Details: CBC unremarkable, BMP with hyperglycemia and hypokalemia 3.4, hepatic function panel is reassuring, troponins are flat at 28 x 2.  BNP near patient's baseline at 393 alcohol  is normal.  Radiology: ordered.    Details:  Chest x-ray with cardiomegaly and pulmonary vascular congestion similar to prior  ECG/medicine tests:     Details: EKG with normal sinus rhythm without ischemic changes.  Risk Prescription drug management.   Clinical picture most consistent with suicidality.  No evidence of acute cardiopulmonary disease.  Patient will require psychiatric  evaluation but remains voluntary at this time.  No indication for IVC at this time.  Pending TTS consult.  Pending psychiatric recommendations at time of shift change. Patient remains voluntary.  Moesha  voiced understanding of her medical evaluation and treatment plan. Each of their questions answered to their expressed satisfaction. This chart was dictated using voice recognition software, Dragon. Despite the best efforts of this provider to proofread and correct errors, errors may still occur which can change documentation meaning.     Final diagnoses:  Suicidal ideation    ED Discharge Orders     None          Bobette Pleasant JONELLE DEVONNA 03/02/24 9374    Lorette Mayo, MD 03/02/24 [phone]

## 2024-03-02 NOTE — ED Notes (Signed)
 Pt left with [facility] for [facility]. Belongings and all paperwork sent with patient.

## 2024-03-02 NOTE — Tx Team (Signed)
 Initial Treatment Plan 03/02/2024 7:58 PM [name] FMW:[id number]    PATIENT STRESSORS: Financial difficulties   Health problems   Medication change or noncompliance   Occupational concerns   Substance abuse   Traumatic event     PATIENT STRENGTHS: Capable of independent living  Communication skills  Supportive family/friends    PATIENT IDENTIFIED PROBLEMS: Medication noncompliance I stopped taking my medications because it was too potent for me.    Alterations in mood (Anxiety & depression)    Polysubstance Abuse (crack, etoh, marijuana) I last smoked crack yesterday. I've been smoking since I was 48 y/o.    Homelessness         DISCHARGE CRITERIA:  Improved stabilization in mood, thinking, and/or behavior Verbal commitment to aftercare and medication compliance Withdrawal symptoms are absent or subacute and managed without 24-hour nursing intervention  PRELIMINARY DISCHARGE PLAN: Outpatient therapy Placement in alternative living arrangements  PATIENT/FAMILY INVOLVEMENT: This treatment plan has been presented to and reviewed with the patient, Julie [name] Alert. The patient have been given the opportunity to ask questions and make suggestions.  Chennel Olivos, RN 03/02/2024, 7:58 PM

## 2024-03-02 NOTE — Progress Notes (Signed)
 Pt has been accepted to [facility] on 03/02/2024 . Bed assignment:300-2   Pt meets inpatient criteria per Alan Mcardle, NP   Attending Physician will be Dr. Raliegh  Report can be called to: - Adult unit: [phone]  Pt can arrive after 12pm  pending labs   Care Team Notified: [facility] [place], RN, Jerel Gravely, RN, Dena Kelch, RN

## 2024-03-03 DIAGNOSIS — F4310 Post-traumatic stress disorder, unspecified: Secondary | ICD-10-CM | POA: Insufficient documentation

## 2024-03-03 DIAGNOSIS — F411 Generalized anxiety disorder: Secondary | ICD-10-CM | POA: Insufficient documentation

## 2024-03-03 MED ORDER — AMOXICILLIN-POT CLAVULANATE 875-125 MG PO TABS
1.0000 | ORAL_TABLET | Freq: Two times a day (BID) | ORAL | Status: DC
  Administered 2024-03-03 – 2024-03-06 (×7): 1 via ORAL
  Filled 2024-03-03 (×7): qty 1

## 2024-03-03 MED ORDER — FUROSEMIDE 20 MG PO TABS
20.0000 mg | ORAL_TABLET | Freq: Every day | ORAL | Status: DC
  Administered 2024-03-03 – 2024-03-06 (×4): 20 mg via ORAL
  Filled 2024-03-03 (×4): qty 1

## 2024-03-03 MED ORDER — IBUPROFEN 600 MG PO TABS
600.0000 mg | ORAL_TABLET | Freq: Four times a day (QID) | ORAL | Status: DC | PRN
  Administered 2024-03-04 – 2024-03-05 (×5): 600 mg via ORAL
  Filled 2024-03-03 (×6): qty 1

## 2024-03-03 MED ORDER — RISPERIDONE 0.5 MG PO TABS
0.5000 mg | ORAL_TABLET | Freq: Two times a day (BID) | ORAL | Status: DC
  Administered 2024-03-03 – 2024-03-06 (×7): 0.5 mg via ORAL
  Filled 2024-03-03 (×7): qty 1

## 2024-03-03 MED ORDER — AMLODIPINE BESYLATE 5 MG PO TABS
5.0000 mg | ORAL_TABLET | Freq: Every day | ORAL | Status: DC
  Administered 2024-03-03 – 2024-03-06 (×4): 5 mg via ORAL
  Filled 2024-03-03 (×4): qty 1

## 2024-03-03 MED ORDER — POTASSIUM CHLORIDE 20 MEQ PO PACK
20.0000 meq | PACK | Freq: Every day | ORAL | Status: DC
  Filled 2024-03-03: qty 1

## 2024-03-03 MED ORDER — POTASSIUM CHLORIDE CRYS ER 20 MEQ PO TBCR
20.0000 meq | EXTENDED_RELEASE_TABLET | Freq: Every day | ORAL | Status: DC
  Administered 2024-03-03 – 2024-03-06 (×4): 20 meq via ORAL
  Filled 2024-03-03 (×4): qty 1

## 2024-03-03 MED ORDER — CLONIDINE HCL 0.1 MG PO TABS
0.1000 mg | ORAL_TABLET | Freq: Once | ORAL | Status: AC
  Administered 2024-03-03: 0.1 mg via ORAL
  Filled 2024-03-03: qty 1

## 2024-03-03 NOTE — Progress Notes (Signed)
 Tour of Duty:  Prentice JINNY Angle, RN, 03/03/24, Tour of Duty: 0700-1900  SI/HI/AVH: Lewanda GUTTA, denies plan or intent. Denies HI, AVH.  Self-Reported   Mood: Negative  Anxiety: Endorses Depression: Endorses Irritability: Denies  Broset  Violence Prevention Guidelines *See Row Information*: Small Violence Risk interventions implemented   LBM  Last BM Date : 03/02/24   Pain: present, PRN provided (see MAR)  Patient Refusals (including Rx): No  Shift Summary: Patient observed to be calm on unit. Patient able to make needs known, reports anxiety and depression during assessment. Patient observed to engage appropriately with staff and peers. Patient taking medications as prescribed. This shift, several PRN medication requested and required. No observed or reported side effects to medication. No observed or reported agitation, aggression, or other acute emotional distress. Patient consistently hypertensive today. PRNs administered for HTN (183/107 in the afternoon) and LP made aware. Patient reports tooth infection per ED LP, ATB course initiated, patient reports severe pain, LP made aware. No additional observed or reported physical abnormalities or concerns.  Last Vitals  Vitals Weight: 124.1 kg Temp: 98.2 F (36.8 C) Temp Source: Oral Pulse Rate: 69 Resp: 20 BP: (!) 183/107 Patient Position: (not recorded)  Admission Type  Psych Admission Type (Psych Patients Only) Admission Status: Voluntary Date 72 hour document signed : (not recorded) Time 72 hour document signed : (not recorded) Provider Notified (First and Last Name) (see details for LINK to note): (not recorded)   Psychosocial Assessment  Psychosocial Assessment Patient Complaints: Depression Eye Contact: Fair Facial Expression: Worried Affect: Anxious, Depressed Speech: Logical/coherent Interaction: Minimal Motor Activity: Other (Comment) (WDL) Appearance/Hygiene: Unremarkable Behavior Characteristics:  Cooperative Mood: Depressed   Aggressive Behavior  Targets: (not recorded)   Thought Process  Thought Process Coherency: Within Defined Limits Content: Within Defined Limits Delusions: None reported or observed Perception: Within Defined Limits Hallucination: None reported or observed Judgment: Limited Confusion: None  Danger to Self/Others  Danger to Self Current suicidal ideation?: Denies Description of Suicide Plan: (not recorded) Self-Injurious Behavior: (not recorded) Agreement Not to Harm Self: (not recorded) Description of Agreement: (not recorded) Danger to Others: None reported or observed

## 2024-03-03 NOTE — Group Note (Signed)
 Date:  03/03/2024 Time:  11:21 AM  Group Topic/Focus:  Safety Plan This group focused on helping patients identify personal safety goals and strategies to support emotional and physical well-being. Patients discussed the meaning of safety, explored warning signs, coping skills, and sources of support, and were encouraged to share individualized safety goals. The group emphasized self-awareness, personal strengths, and the importance of proactive planning in maintaining safety and supporting recovery.    Participation Level:  Did Not Attend  Participation Quality:  N/A  Affect:  N/A  Cognitive:  N/A  Insight: None  Engagement in Group:  None  Modes of Intervention:  N/A  Additional Comments:  Pt did not attend this safety plan group.  Kristi HERO Ankith Edmonston 03/03/2024, 11:21 AM

## 2024-03-03 NOTE — Progress Notes (Signed)
(  Sleep Hours) -6.25  (Any PRNs that were needed, meds refused, or side effects to meds)- Clonidine  0.1mg , Tylenol  650mg , Trazadone 50mg , hydroxyzine  25mg   (Any disturbances and when (visitation, over night)-none  (Concerns raised by the patient)- Patient with continued c/o right jaw/teeth infection/pain. States was told in ED she had an abscess (Given 1 dose of Augmentin  and Ibuprofen  600mg  in ED)  (SI/HI/AVH)- denies

## 2024-03-03 NOTE — [facility] Group Notes (Signed)
 LCSW Wellness Group Note   03/03/2024 11:00am  Type of Group and Topic: Psychoeducational Group:  Wellness  Participation Level:  minimal  Description of Group  Wellness group introduces the topic and its focus on developing healthy habits across the spectrum and its relationship to a decrease in hospital admissions.  Six areas of wellness are discussed: physical, social spiritual, intellectual, occupational, and emotional.  Patients are asked to consider their current wellness habits and to identify areas of wellness where they are interested and able to focus on improvements.    Therapeutic Goals Patients will understand components of wellness and how they can positively impact overall health.  Patients will identify areas of wellness where they have developed good habits. Patients will identify areas of wellness where they would like to make improvements.    Summary of Patient Progress: pt appeared attentive in group but did not participate in group discussion.  When called on by CSW, pt identified social and emotional as wellness areas of strength.  Pt identified spiritual as wellness are that needs improvement.      Therapeutic Modalities: Cognitive Behavioral Therapy Psychoeducation    Bridget Cordella Simmonds, LCSW

## 2024-03-03 NOTE — Progress Notes (Signed)
   03/03/24 2219  Psych Admission Type (Psych Patients Only)  Admission Status Voluntary  Psychosocial Assessment  Patient Complaints Anxiety  Eye Contact Fair  Facial Expression Animated  Affect Appropriate to circumstance  Speech Logical/coherent  Interaction Assertive  Motor Activity Other (Comment) (WDL)  Appearance/Hygiene Unremarkable  Behavior Characteristics Appropriate to situation  Mood Anxious;Pleasant  Thought Process  Coherency WDL  Content WDL  Delusions None reported or observed  Perception WDL  Hallucination None reported or observed  Judgment Limited  Confusion None  Danger to Self  Current suicidal ideation? Denies  Self-Injurious Behavior No self-injurious ideation or behavior indicators observed or expressed   Agreement Not to Harm Self Yes  Description of Agreement verbal  Danger to Others  Danger to Others None reported or observed

## 2024-03-03 NOTE — Group Note (Signed)
 Date:  03/03/2024 Time:  10:09 AM  Group Topic/Focus:  Goals Group:   The focus of this group is to help patients establish daily goals to achieve during treatment and discuss how the patient can incorporate goal setting into their daily lives to aide in recovery.    Participation Level:  Did Not Attend  Participation Quality:  N/A  Affect:  N/A  Cognitive:  N/A  Insight: None  Engagement in Group:  None  Modes of Intervention:  N/A  Additional Comments:  Pt did not attend goals group.  Kristi HERO [place] Current 03/03/2024, 10:09 AM
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
## 2024-03-03 NOTE — Group Note (Signed)
 Date:  03/03/2024 Time:  4:44 PM  Group Topic/Focus:  Self Care:   The focus of this group is to help patients understand the importance of self-care and sleep hygiene in order to improve or restore emotional, physical, spiritual, interpersonal, and financial health.    Participation Level:  Minimal  Participation Quality:  Appropriate and Attentive  Affect:  Appropriate  Cognitive:  Alert, Appropriate, and Oriented  Insight: Improving  Engagement in Group:  Developing/Improving  Modes of Intervention:  Discussion   Julie [name] 03/03/2024, 4:44 PM

## 2024-03-03 NOTE — Plan of Care (Signed)
 ?  Problem: Education: ?Goal: Mental status will improve ?Outcome: Progressing ?Goal: Verbalization of understanding the information provided will improve ?Outcome: Progressing ?  ?

## 2024-03-03 NOTE — Progress Notes (Signed)
  BP elevated as below. Patient asymptomatic. Clonidine  0.1mg  po given as per prn provider order. Follow up BP below ordered parameters for additional prn HTN medication.    03/02/24 2038 03/02/24 2220  Vital Signs  Pulse Rate 96 84  Pulse Rate Source  --  Monitor  BP (!) 174/106 (!) 140/75  BP Location Right Arm  --   BP Method Automatic Automatic  Patient Position (if appropriate) Lying  --   Oxygen Therapy  SpO2 98 % 100 %

## 2024-03-03 NOTE — Plan of Care (Signed)
   Problem: Education: Goal: Emotional status will improve Outcome: Not Progressing Goal: Mental status will improve Outcome: Not Progressing

## 2024-03-03 NOTE — Progress Notes (Signed)
 CSW attempted to complete PSA with patient. Patient reports now is not a good time, stating she wanted to go to sleep. CSW will make another attempt at a later time.

## 2024-03-03 NOTE — [facility] Group Notes (Signed)
 [facility] Group Notes:  (Nursing/MHT/Case Management/Adjunct)  Date:  03/03/2024  Time:  4:15 AM  Type of Therapy:  AA Group  Participation Level:  Did Not Attend  Participation Quality:    Affect:    Cognitive:    Insight:    Engagement in Group:    Modes of Intervention:    Summary of Progress/Problems: Refused to attend group.   Grayce LITTIE Essex 03/03/2024, 4:15 AM
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
## 2024-03-04 LAB — LIPID PANEL
Cholesterol: 194 mg/dL (ref 0–200)
HDL: 55 mg/dL (ref >40)
LDL Cholesterol: 105 mg/dL — ABNORMAL HIGH (ref 0–99)
Total CHOL/HDL Ratio: 3.6 ratio
Triglycerides: 169 mg/dL — ABNORMAL HIGH (ref <150)
VLDL: 34 mg/dL (ref 0–40)

## 2024-03-04 LAB — COMPREHENSIVE METABOLIC PANEL WITH GFR
ALT: 45 U/L — ABNORMAL HIGH (ref 0–44)
AST: 17 U/L (ref 15–41)
Albumin: 3.9 g/dL (ref 3.5–5.0)
Alkaline Phosphatase: 61 U/L (ref 38–126)
Anion gap: 10 (ref 5–15)
BUN: 10 mg/dL (ref 6–20)
CO2: 26 mmol/L (ref 22–32)
Calcium: 11.3 mg/dL — ABNORMAL HIGH (ref 8.9–10.3)
Chloride: 106 mmol/L (ref 98–111)
Creatinine, Ser: 0.73 mg/dL (ref 0.44–1.00)
GFR, Estimated: >60 mL/min (ref >60)
Glucose, Bld: 214 mg/dL — ABNORMAL HIGH (ref 70–99)
Potassium: 4.2 mmol/L (ref 3.5–5.1)
Sodium: 143 mmol/L (ref 135–145)
Total Bilirubin: <0.2 mg/dL (ref 0.0–1.2)
Total Protein: 6.9 g/dL (ref 6.5–8.1)

## 2024-03-04 LAB — HEMOGLOBIN A1C
Hgb A1c MFr Bld: 9.2 % — ABNORMAL HIGH (ref 4.8–5.6)
Mean Plasma Glucose: 217.34 mg/dL

## 2024-03-04 LAB — TSH: TSH: 5.89 u[IU]/mL — ABNORMAL HIGH (ref 0.350–4.500)

## 2024-03-04 MED ORDER — SERTRALINE HCL 50 MG PO TABS
50.0000 mg | ORAL_TABLET | Freq: Every day | ORAL | Status: DC
  Administered 2024-03-05 – 2024-03-06 (×2): 50 mg via ORAL
  Filled 2024-03-04 (×2): qty 1

## 2024-03-04 MED ORDER — METFORMIN HCL 500 MG PO TABS
1000.0000 mg | ORAL_TABLET | Freq: Two times a day (BID) | ORAL | Status: DC
  Administered 2024-03-04 – 2024-03-06 (×4): 1000 mg via ORAL
  Filled 2024-03-04 (×4): qty 2

## 2024-03-04 MED ORDER — SERTRALINE HCL 25 MG PO TABS
25.0000 mg | ORAL_TABLET | Freq: Once | ORAL | Status: AC
  Administered 2024-03-04: 25 mg via ORAL
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
## 2024-03-04 NOTE — Plan of Care (Signed)
   Problem: Education: Goal: Emotional status will improve Outcome: Progressing Goal: Mental status will improve Outcome: Progressing

## 2024-03-04 NOTE — Group Note (Signed)
 Date:  03/04/2024 Time:  9:07 PM  Group Topic/Focus:  Wrap-Up Group:   The focus of this group is to help patients review their daily goal of treatment and discuss progress on daily workbooks.    Participation Level:  Active  Participation Quality:  Appropriate  Affect:  Appropriate  Cognitive:  Appropriate  Insight: Appropriate  Engagement in Group:  Engaged  Modes of Intervention:  Discussion  Additional Comments:   Pt states she's glad she was able to be more open minded today. Pt participated in programming and recreational activities. Pt endorsed anxiety at a 3 and pain in her mouth.   Shayona Hibbitts A Zackari Ruane 03/04/2024, 9:07 PM

## 2024-03-04 NOTE — Plan of Care (Signed)
   Problem: Education: Goal: Mental status will improve Outcome: Progressing   Problem: Education: Goal: Verbalization of understanding the information provided will improve Outcome: Progressing

## 2024-03-04 NOTE — Group Note (Signed)
 Date:  03/04/2024 Time:  11:55 AM  Group Topic/Focus:  Goals Group:   The focus of this group is to help patients establish daily goals to achieve during treatment and discuss how the patient can incorporate goal setting into their daily lives to aide in recovery.    Participation Level:  Active  Participation Quality:  Appropriate  Affect:  Appropriate  Cognitive:  Appropriate  Insight: Appropriate  Engagement in Group:  Improving  Modes of Intervention:  Discussion  Additional Comments:  Pt Attended group  Julie [name] E Ethelwyn Gilbertson 03/04/2024, 11:55 AM

## 2024-03-04 NOTE — Progress Notes (Signed)
 Tour of Duty:  Prentice JINNY Angle, RN, 03/04/24, Tour of Duty: 0700-1900  SI/HI/AVH: Denies  Self-Reported   Mood: Positive  Anxiety: Denies, but Observable Depression: Endorses Irritability: Denies  Broset  Violence Prevention Guidelines *See Row Information*: Small Violence Risk interventions implemented   LBM  Last BM Date : 03/02/24   Pain: present, PRN provided (see MAR)  Patient Refusals (including Rx): Yes, including nicotine  patch  Shift Summary: Patient observed to be calm on unit. Patient able to make needs known. Patient observed to engage appropriately with staff and peers. Patient taking medications as prescribed. This shift, no PRN medication requested or required. No observed or reported side effects to medication. No observed or reported agitation, aggression, or other acute emotional distress. Patient continues to report severe tooth and jaw pain. Patient blood pressures remain elevated but are showing improvement from yesterday. No additional observed or reported physical abnormalities or concerns. Suspected patient is minimizing, seeking discharge. Patient eager to speak to Dr. Raliegh this morning to seek discharge.  Last Vitals  Vitals Weight: 124.1 kg Temp: 98.2 F (36.8 C) Temp Source: Oral Pulse Rate: 80 Resp: 20 BP: (!) 164/100 Patient Position: (not recorded)  Admission Type  Psych Admission Type (Psych Patients Only) Admission Status: Voluntary Date 72 hour document signed : (not recorded) Time 72 hour document signed : (not recorded) Provider Notified (First and Last Name) (see details for LINK to note): (not recorded)   Psychosocial Assessment  Psychosocial Assessment Patient Complaints: Depression Eye Contact: Fair Facial Expression: Worried Affect: Depressed Speech: Logical/coherent Interaction: Minimal Motor Activity: Other (Comment) (WDL) Appearance/Hygiene: Unremarkable Behavior Characteristics: Cooperative Mood: Depressed,  Pleasant   Aggressive Behavior  Targets: (not recorded)   Thought Process  Thought Process Coherency: Within Defined Limits Content: Within Defined Limits Delusions: None reported or observed Perception: Within Defined Limits Hallucination: None reported or observed Judgment: Limited Confusion: None  Danger to Self/Others  Danger to Self Current suicidal ideation?: Passive Description of Suicide Plan: (not recorded) Self-Injurious Behavior: 1 Agreement Not to Harm Self: (not recorded) Description of Agreement: (not recorded) Danger to Others: None reported or observed

## 2024-03-04 NOTE — Progress Notes (Signed)
 [facility] MD Progress Note  03/04/2024 11:20 AM Julie [name]  MRN:  [id number] Subjective:   Julie [name] is a 48 yr old female who presented to [facility] 0n 9/11 with complaints of depression and suicide attempt (volume overload with CHF), she was admitted to [facility] on 9/13. PPHx is significant for MDD, GAD, PTSD, Polysubstance Abuse (Cocaine, THC), and a history of Bipolar Disorder, and multiple Suicide Attempts (last OD ~age 11), history of Self Injurious Behavior (Cutting- last age 41), and ~15 Psychiatric Hospitalizations (last- [facility] 07/2023).    Case was discussed in the multidisciplinary team. MAR was reviewed and patient was compliant with medications.  She received PRN Tylenol  x2, Clonidine  x2, and Trazodone  yesterday.   Psychiatric Team made the following recommendations yesterday: -Start Risperdal  0.5 mg BID for mood stability and augmentation -Continue Augmentin  875-125 mg BID for 7 days (Day 1 of 7) -Restart Lasix  20 mg daily -Restart Klor-Con  20 mEq daily -Restart Amlodipine  5 mg daily    On interview today patient reports she slept good last night.  She reports her appetite is doing good.  She reports no SI, HI, or AVH.  She reports no Paranoia or Ideas of Reference.  She reports no issues with her medications.  She reports that the Risperdal  has been helpful.  She also reports talking with her BF which has been helpful.  Discussed starting Zoloft  today and she was agreeable.  Discussed her elevated A1c and she reports that she had been on Metformin  in the past and was agreeable restarting it.  Discussed we could assist her with getting a PCP at discharge and she was agreeable with this.  She reports no other concerns at present.   Principal Problem: MDD (major depressive disorder), recurrent severe, without psychosis (HCC) Diagnosis: Principal Problem:   MDD (major depressive disorder), recurrent severe, without psychosis (HCC) Active Problems:   GAD (generalized anxiety  disorder)   PTSD (post-traumatic stress disorder)  Total Time spent with patient:  I personally spent 35 minutes on the unit in direct patient care. The direct patient care time included face-to-face time with the patient, reviewing the patient's chart, communicating with other professionals, and coordinating care.    Past Psychiatric History:  MDD, GAD, PTSD, Polysubstance Abuse (Cocaine, THC), and a history of Bipolar Disorder, and multiple Suicide Attempts (last OD ~age 11), history of Self Injurious Behavior (Cutting- last age 66), and ~15 Psychiatric Hospitalizations (last- [facility] 07/2023).   Past Medical History:  Past Medical History:  Diagnosis Date   Asthma    Diabetes mellitus without complication (HCC)    Fracture, humerus closed, shaft 07/11/2014   left   History of bronchitis    Mental health problem    Open fracture of great toe of left foot 12/25/2015    Past Surgical History:  Procedure Laterality Date   CESAREAN SECTION     ECTOPIC PREGNANCY SURGERY  2003   INCISION AND DRAINAGE Left 12/25/2015   Procedure: INCISION AND DRAINAGE With  Amputation of distal tip of Left Great and Second Toe.;  Surgeon: Kay CHRISTELLA Cummins, MD;  Location: [facility];  Service: Orthopedics;  Laterality: Left;   LIPOMA EXCISION     chest   ORIF HUMERUS FRACTURE Left 07/15/2014   Procedure: OPEN REDUCTION INTERNAL FIXATION (ORIF) LEFT HUMERAL SHAFT ;  Surgeon: Evalene JONETTA Chancy, MD;  Location: [facility];  Service: Orthopedics;  Laterality: Left;   TUBAL LIGATION  2003   Family History: History reviewed.  No pertinent family history. Family Psychiatric  History:  Maternal Cousin- Schizophrenia, Suicide Attempts Father- EtOH Abuse Multiple Cousins- Substance Abuse   Social History:  Social History   Substance and Sexual Activity  Alcohol  Use Yes   Comment: occasionally     Social History   Substance and Sexual Activity  Drug Use Yes   Types: Marijuana, Crack cocaine,  MDMA (Ecstacy)   Comment: Crack, THC, Molly    Social History   Socioeconomic History   Marital status: Single    Spouse name: Not on file   Number of children: Not on file   Years of education: Not on file   Highest education level: Not on file  Occupational History   Not on file  Tobacco Use   Smoking status: Every Day    Current packs/day: 0.50    Average packs/day: 0.5 packs/day for 18.0 years (9.0 ttl pk-yrs)    Types: Cigarettes   Smokeless tobacco: Never   Tobacco comments:    6 cig./day  Vaping Use   Vaping status: Never Used  Substance and Sexual Activity   Alcohol  use: Yes    Comment: occasionally   Drug use: Yes    Types: Marijuana, Crack cocaine, MDMA (Ecstacy)    Comment: Crack, THC, Molly   Sexual activity: Yes    Birth control/protection: I.U.D.  Other Topics Concern   Not on file  Social History Narrative   Not on file   Social Drivers of Health   Financial Resource Strain: Not on file  Food Insecurity: Food Insecurity Present (03/02/2024)   Hunger Vital Sign    Worried About Running Out of Food in the Last Year: Often true    Ran Out of Food in the Last Year: Often true  Transportation Needs: Unmet Transportation Needs (03/02/2024)   PRAPARE - Administrator, Civil Service (Medical): Yes    Lack of Transportation (Non-Medical): Yes  Physical Activity: Not on file  Stress: Not on file  Social Connections: Not on file   Additional Social History:                         Sleep: Good Estimated Sleeping Duration (Last 24 Hours): 5.75 hours  Appetite:  Good  Current Medications: Current Facility-Administered Medications  Medication Dose Route Frequency Provider Last Rate Last Admin   acetaminophen  (TYLENOL ) tablet 650 mg  650 mg Oral Q6H PRN Mannie Jerel PARAS, NP   650 mg at 03/04/24 9367   albuterol  (VENTOLIN  HFA) 108 (90 Base) MCG/ACT inhaler 1-2 puff  1-2 puff Inhalation Q6H PRN Raliegh Marsa RAMAN, DO   2 puff at  03/02/24 1805   alum & mag hydroxide-simeth (MAALOX/MYLANTA) 200-200-20 MG/5ML suspension 30 mL  30 mL Oral Q4H PRN Mannie Jerel PARAS, NP       amLODipine  (NORVASC ) tablet 5 mg  5 mg Oral Daily Morey Andonian S, DO   5 mg at 03/04/24 9081   amoxicillin -clavulanate (AUGMENTIN ) 875-125 MG per tablet 1 tablet  1 tablet Oral Q12H Fatimah Sundquist S, DO   1 tablet at 03/04/24 9080   cloNIDine  (CATAPRES ) tablet 0.1 mg  0.1 mg Oral TID PRN Arland Usery S, DO   0.1 mg at 03/03/24 1653   haloperidol  (HALDOL ) tablet 5 mg  5 mg Oral TID PRN Mannie Jerel PARAS, NP       And   diphenhydrAMINE  (BENADRYL ) capsule 50 mg  50 mg Oral TID PRN Mannie Jerel PARAS, NP  haloperidol  lactate (HALDOL ) injection 5 mg  5 mg Intramuscular TID PRN Mannie Jerel PARAS, NP       And   diphenhydrAMINE  (BENADRYL ) injection 50 mg  50 mg Intramuscular TID PRN Mannie Jerel PARAS, NP       And   LORazepam  (ATIVAN ) injection 2 mg  2 mg Intramuscular TID PRN Mannie Jerel PARAS, NP       haloperidol  lactate (HALDOL ) injection 10 mg  10 mg Intramuscular TID PRN Mannie Jerel PARAS, NP       And   diphenhydrAMINE  (BENADRYL ) injection 50 mg  50 mg Intramuscular TID PRN Mannie Jerel PARAS, NP       And   LORazepam  (ATIVAN ) injection 2 mg  2 mg Intramuscular TID PRN Mannie Jerel PARAS, NP       furosemide  (LASIX ) tablet 20 mg  20 mg Oral Daily Nhi Butrum S, DO   20 mg at 03/04/24 9081   ibuprofen  (ADVIL ) tablet 600 mg  600 mg Oral Q6H PRN Bobbitt, Shalon E, NP   600 mg at 03/04/24 9081   magnesium  hydroxide (MILK OF MAGNESIA) suspension 30 mL  30 mL Oral Daily PRN Mannie Jerel PARAS, NP       metFORMIN  (GLUCOPHAGE ) tablet 1,000 mg  1,000 mg Oral BID WC Anusha Claus S, DO       nicotine  (NICODERM CQ  - dosed in mg/24 hours) patch 21 mg  21 mg Transdermal Q0600 Mannie Jerel PARAS, NP       potassium chloride  SA (KLOR-CON  M) CR tablet 20 mEq  20 mEq Oral Daily Emri Sample S, DO   20 mEq at 03/04/24 9081   risperiDONE   (RISPERDAL ) tablet 0.5 mg  0.5 mg Oral BID Shaterria Sager S, DO   0.5 mg at 03/04/24 9081   sertraline  (ZOLOFT ) tablet 25 mg  25 mg Oral Once Orlo Brickle S, DO       Followed by   NOREEN ON 03/05/2024] sertraline  (ZOLOFT ) tablet 50 mg  50 mg Oral Daily Juleah Paradise S, DO       traZODone  (DESYREL ) tablet 50 mg  50 mg Oral QHS PRN Ajibola, Ene A, NP   50 mg at 03/03/24 2006    Lab Results:  Results for orders placed or performed during the hospital encounter of 03/02/24 (from the past 48 hours)  Hemoglobin A1c     Status: Abnormal   Collection Time: 03/04/24  6:40 AM  Result Value Ref Range   Hgb A1c MFr Bld 9.2 (H) 4.8 - 5.6 %    Comment: (NOTE) Diagnosis of Diabetes The following HbA1c ranges recommended by the American Diabetes Association (ADA) may be used as an aid in the diagnosis of diabetes mellitus.  Hemoglobin             Suggested A1C NGSP%              Diagnosis  <5.7                   Non Diabetic  5.7-6.4                Pre-Diabetic  >6.4                   Diabetic  <7.0                   Glycemic control for  adults with diabetes.     Mean Plasma Glucose 217.34 mg/dL    Comment: Performed at [facility], [address] [place], [place] [address]  TSH     Status: Abnormal   Collection Time: 03/04/24  6:40 AM  Result Value Ref Range   TSH 5.890 (H) 0.350 - 4.500 uIU/mL    Comment: Performed at [facility], [address], [place], [place] [address]  Comprehensive metabolic panel with GFR     Status: Abnormal   Collection Time: 03/04/24  6:40 AM  Result Value Ref Range   Sodium 143 135 - 145 mmol/L   Potassium 4.2 3.5 - 5.1 mmol/L   Chloride 106 98 - 111 mmol/L   CO2 26 22 - 32 mmol/L   Glucose, Bld 214 (H) 70 - 99 mg/dL    Comment: Glucose reference range applies only to samples taken after fasting for at least 8 hours.   BUN 10 6 - 20 mg/dL   Creatinine, Ser 9.26 0.44 - 1.00 mg/dL    Calcium  11.3 (H) 8.9 - 10.3 mg/dL   Total Protein 6.9 6.5 - 8.1 g/dL   Albumin 3.9 3.5 - 5.0 g/dL   AST 17 15 - 41 U/L   ALT 45 (H) 0 - 44 U/L   Alkaline Phosphatase 61 38 - 126 U/L   Total Bilirubin <0.2 0.0 - 1.2 mg/dL   GFR, Estimated >39 >39 mL/min    Comment: (NOTE) Calculated using the CKD-EPI Creatinine Equation (2021)    Anion gap 10 5 - 15    Comment: Performed at [facility], [address] [place], [place] [address]    Blood Alcohol  level:  Lab Results  Component Value Date   [facility] <15 03/01/2024   ETH <10 03/16/2023    Metabolic Disorder Labs: Lab Results  Component Value Date   HGBA1C 9.2 (H) 03/04/2024   MPG 217.34 03/04/2024   MPG 208.73 02/09/2024   No results found for: PROLACTIN No results found for: CHOL, TRIG, HDL, CHOLHDL, VLDL, LDLCALC  Physical Findings: AIMS:  ,  ,  ,  ,  ,  ,   CIWA:    COWS:     Musculoskeletal: Strength & Muscle Tone: within normal limits Gait & Station: normal Patient leans: N/A  Psychiatric Specialty Exam:  Presentation  General Appearance:  Appropriate for Environment; Casual  Eye Contact: Fair  Speech: Clear and Coherent; Normal Rate  Speech Volume: Normal  Handedness: Right   Mood and Affect  Mood: Dysphoric  Affect: Congruent   Thought Process  Thought Processes: Coherent; Goal Directed  Descriptions of Associations:Intact  Orientation:Full (Time, Place and Person)  Thought Content:WDL; Logical  History of Schizophrenia/Schizoaffective disorder:No  Duration of Psychotic Symptoms:No data recorded Hallucinations:Hallucinations: None  Ideas of Reference:None  Suicidal Thoughts:Suicidal Thoughts: No  Homicidal Thoughts:Homicidal Thoughts: No   Sensorium  Memory: Immediate Fair; Recent Fair  Judgment: Intact (improving)  Insight: Present (improving)   Executive Functions  Concentration: Fair  Attention  Span: Fair  Recall: [employer] of Knowledge: Fair  Language: Good   Psychomotor Activity  Psychomotor Activity: Psychomotor Activity: Normal   Assets  Assets: Communication Skills; Desire for Improvement; Resilience   Sleep  Sleep: Sleep: Good    Physical Exam: Physical Exam Vitals and nursing note reviewed.  Constitutional:      General: She is not in acute distress.    Appearance: Normal appearance. She is obese. She is not ill-appearing or toxic-appearing.  HENT:     Head: Normocephalic and atraumatic.  Pulmonary:     Effort: Pulmonary effort is normal.  Musculoskeletal:        General: Normal range of motion.  Neurological:     General: No focal deficit present.     Mental Status: She is alert.    Review of Systems  Respiratory:  Negative for cough and shortness of breath.   Cardiovascular:  Negative for chest pain.  Gastrointestinal:  Negative for abdominal pain, constipation, diarrhea, nausea and vomiting.  Neurological:  Negative for dizziness, weakness and headaches.  Psychiatric/Behavioral:  Positive for depression. Negative for hallucinations and suicidal ideas. The patient is not nervous/anxious.    Blood pressure (!) 159/87, pulse 74, temperature 98.2 F (36.8 C), temperature source Oral, resp. rate 20, height 5' 10 (1.778 m), weight 124.1 kg, SpO2 100%. Body mass index is 39.26 kg/m.   Treatment Plan Summary: Daily contact with patient to assess and evaluate symptoms and progress in treatment and Medication management   Julie [name] is a 48 yr old female who presented to [facility] 0n 9/11 with complaints of depression and suicide attempt (volume overload with CHF), she was admitted to [facility] on 9/13.  PPHx is significant for MDD, GAD, PTSD, Polysubstance Abuse (Cocaine, THC), and a history of Bipolar Disorder, and multiple Suicide Attempts (last OD ~age 86), history of Self Injurious Behavior (Cutting- last age 38), and ~15 Psychiatric  Hospitalizations (last- [facility]/2023).     Zailey reports improvement with the Risperdal .  We will start Zoloft  today.  Given her elevated A1c we will restart Metformin .  She does have an elevated TSH so we will order a Free T4.  These issues necessitate getting her a PCP at discharge so she can follow up on these along with her CHF.  Her blood pressure continues to be elevated but the pain in her jaw is improving so if her blood pressure does not return towards normal we will increase her Amlodipine  tomorrow.  We will continue to monitor.      MDD, Recurrent, Severe, w/out Psychosis (r/out Schizoaffective Disorder)  GAD  PTSD: -Start Zoloft  25 mg daily for depression and anxiety -Continue Risperdal  0.5 mg BID for mood stability and augmentation -Consider starting Zoloft  tomorrow for depression and anxiety -Continue Agitation Protocol: Haldol /Ativan /Benadryl      Asthma: -Continue Albuterol  1-2 puffs q6 PRN     Dental Infection: -Continue Augmentin  875-125 mg BID for 7 days (Day 2 of 7)     CHF: -Continue Lasix  20 mg daily -Continue Klor-Con  20 mEq daily -K: 4.2     HTN: -Continue Amlodipine  5 mg daily   Diabetes: -Start Metformin  1000 mg BID this evening -Follow up outpatient  Hypothyroid: -TSH: 5.890 -Free T4 ordered for 9/16 -Follow up outpatient     -Continue Clonidine  0.1 mg TID PRN  SBP > 160 or DBP > 100 -Continue PRN's: Tylenol , Maalox, Atarax , Milk of Magnesia, Trazodone    --  The risks/benefits/side-effects/alternatives to medications were discussed in detail with the patient and time was given for questions. The patient consents to medication trials.                -- Metabolic profile and EKG monitoring obtained while on an atypical antipsychotic (BMI: 39.26 Lipid Panel: HbgA1c: 9.2, EKG: NSR w/ QTc: 449)              -- Encouraged patient to participate in unit milieu and in scheduled group therapies              --  Short Term Goals: Ability to  identify changes in lifestyle to reduce recurrence of condition will improve, Ability to verbalize feelings will improve, Ability to disclose and discuss suicidal ideas, Ability to demonstrate self-control will improve, Ability to identify and develop effective coping behaviors will improve, Ability to maintain clinical measurements within normal limits will improve, Compliance with prescribed medications will improve, and Ability to identify triggers associated with substance abuse/mental health issues will improve             -- Long Term Goals: Improvement in symptoms so as ready for discharge   Safety and Monitoring:             -- Voluntary admission to inpatient psychiatric unit for safety, stabilization and treatment             -- Daily contact with patient to assess and evaluate symptoms and progress in treatment             -- Patient's case to be discussed in multi-disciplinary team meeting             -- Observation Level : q15 minute checks             -- Vital signs:  q12 hours             -- Precautions: suicide, elopement, and assault  Discharge Planning:              -- Social work and case management to assist with discharge planning and identification of hospital follow-up needs prior to discharge             -- Estimated LOS: 2-3 more days             -- Discharge Concerns: Need to establish a safety plan; Medication compliance and effectiveness             -- Discharge Goals: Return home with outpatient referrals for mental health follow-up including medication management/psychotherapy   Marsa GORMAN Rosser, DO 03/04/2024, 11:20 AM

## 2024-03-04 NOTE — Progress Notes (Signed)
   03/04/24 2017  Psych Admission Type (Psych Patients Only)  Admission Status Voluntary  Psychosocial Assessment  Patient Complaints Other (Comment) (tooth pain, see flowsheet)  Eye Contact Fair  Facial Expression Animated  Affect Appropriate to circumstance  Speech Logical/coherent  Interaction Assertive  Motor Activity Other (Comment) (WDL)  Appearance/Hygiene Unremarkable  Behavior Characteristics Appropriate to situation  Mood Depressed  Thought Process  Coherency WDL  Content WDL  Delusions None reported or observed  Perception WDL  Hallucination None reported or observed  Judgment Limited  Confusion None  Danger to Self  Current suicidal ideation? Denies  Self-Injurious Behavior No self-injurious ideation or behavior indicators observed or expressed   Agreement Not to Harm Self Yes  Description of Agreement verbal  Danger to Others  Danger to Others None reported or observed

## 2024-03-04 NOTE — Progress Notes (Signed)
(  Sleep Hours) - 6.5 hours (Any PRNs that were needed, meds refused, or side effects to meds)-  Trazodone , Tylenol , Ibuprofen  (Any disturbances and when (visitation, over night)- Tooth ache made sleeping difficult (Concerns raised by the patient)-  Tooth pain (SI/HI/AVH)-  Denies

## 2024-03-04 NOTE — Group Note (Signed)
 Date:  03/04/2024 Time:  5:44 PM  Group Topic/Focus:  Coping With Mental Health Crisis:   The purpose of this group is to help patients identify strategies for coping with mental health crisis.  Group discusses possible causes of crisis and ways to manage them effectively. Identifying Needs:   The focus of this group is to help patients identify their personal needs that have been historically problematic and identify healthy behaviors to address their needs. Wellness Toolbox:   The focus of this group is to discuss various aspects of wellness, balancing those aspects and exploring ways to increase the ability to experience wellness.  Patients will create a wellness toolbox for use upon discharge.    Participation Level:  Active  Participation Quality:  Appropriate  Affect:  Appropriate  Cognitive:  Appropriate  Insight: Appropriate  Engagement in Group:  Engaged  Modes of Intervention:  Discussion  Additional Comments:    Julie [name] 03/04/2024, 5:44 PM

## 2024-03-05 ENCOUNTER — Encounter (HOSPITAL_COMMUNITY): Payer: Self-pay

## 2024-03-05 MED ORDER — WHITE PETROLATUM EX OINT
TOPICAL_OINTMENT | CUTANEOUS | Status: AC
  Filled 2024-03-05: qty 5

## 2024-03-05 NOTE — Progress Notes (Signed)
 Spiritual care group on grief and loss facilitated by Chaplain Rockie Sofia, [facility]  Group Goal: Support / Education around grief and loss  Members engage in facilitated group support and psycho-social education.  Group Description:  Following introductions and group rules, group members engaged in facilitated group dialogue and support around topic of loss, with particular support around experiences of loss in their lives. Group Identified types of loss (relationships / self / things) and identified patterns, circumstances, and changes that precipitate losses. Reflected on thoughts / feelings around loss, normalized grief responses, and recognized variety in grief experience. Group encouraged individual reflection on safe space and on the coping skills that they are already utilizing.  Group drew on Adlerian / Rogerian and narrative framework  Patient Progress: Julie [name] attended group and actively engaged and participated in group conversation and activities.  She shared about the loss of her mother and the ways that she feels her mother still with her and honors her memory.

## 2024-03-05 NOTE — Progress Notes (Signed)
(  Sleep Hours) - 6.5 hours (Any PRNs that were needed, meds refused, or side effects to meds)-  Clonidine , Ibuprofen , Trazodone  given (Any disturbances and when (visitation, over night)- None (Concerns raised by the patient)-  None (SI/HI/AVH)-  Denies

## 2024-03-05 NOTE — [facility] Suicide Risk Assessment (Signed)
 [facility] INPATIENT:  Family/Significant Other Suicide Prevention Education  Suicide Prevention Education:  Education Completed; Julie [name] (sister) [phone],  (name of family member/significant other) has been identified by the patient as the family member/significant other with whom the patient will be residing, and identified as the person(s) who will aid the patient in the event of a mental health crisis (suicidal ideations/suicide attempt).  With written consent from the patient, the family member/significant other has been provided the following suicide prevention education, prior to the and/or following the discharge of the patient.  The suicide prevention education provided includes the following: Suicide risk factors Suicide prevention and interventions National Suicide Hotline telephone number [facility] assessment telephone number [facility] Emergency Assistance 911 [facility] and/or Residential Mobile Crisis Unit telephone number  Request made of family/significant other to: Remove weapons (e.g., guns, rifles, knives), all items previously/currently identified as safety concern.   Remove drugs/medications (over-the-counter, prescriptions, illicit drugs), all items previously/currently identified as a safety concern.  Julie [name] confirms patient is allowed back home to address [address] [name], [name]. Julie denies presence of any weapons in the home and agrees to remove any illicit drugs from the home. Julie also agrees to help manage and safely patient's medication, and is aware of who to call in the event of a mental health crisis. Patient will need transportation home.   The family member/significant other verbalizes understanding of the suicide prevention education information provided.  The family member/significant other agrees to remove the items of safety concern listed above.  Julie [name] 03/05/2024, 4:09 PM

## 2024-03-05 NOTE — Group Note (Signed)
 Date:  03/05/2024 Time:  4:28 PM  Group Topic/Focus:  Ask Me 3   Group focused on using Ask Me 3 communication tool to learn about their condition and treatment.   Participation Level:  Active  Participation Quality:  Appropriate  Affect:  Appropriate  Cognitive:  Appropriate  Insight: Appropriate  Engagement in Group:  Engaged  Modes of Intervention:  Discussion  Additional Comments:  None  Roark [place] 03/05/2024, 4:28 PM

## 2024-03-05 NOTE — Plan of Care (Signed)
   Problem: Education: Goal: Emotional status will improve Outcome: Progressing Goal: Mental status will improve Outcome: Progressing

## 2024-03-05 NOTE — [facility] Group Notes (Signed)
 [facility] Group Notes:  (Nursing/MHT/Case Management/Adjunct)  Date:  03/05/2024  Time:  9:23 PM  Type of Therapy:  Psychoeducational Skills  Participation Level:  Active  Participation Quality:  Appropriate  Affect:  Appropriate  Cognitive:  Appropriate  Insight:  Good  Engagement in Group:  Engaged  Modes of Intervention:  Education  Summary of Progress/Problems: The patient attended the evening A.A. speaker's meeting and was appropriate.   Trinda Harlacher S 03/05/2024, 9:23 PM

## 2024-03-05 NOTE — Progress Notes (Signed)
 [facility] MD Progress Note  03/05/2024 12:58 PM Julie [name]  MRN:  [id number] Subjective:   Julie [name] is a 48 yr old female who presented to [facility] 0n 9/11 with complaints of depression and suicide attempt (volume overload with CHF), Julie [name]. Julie [name], GAD, PTSD, Polysubstance Abuse (Cocaine, THC), and a history of Bipolar Disorder, and multiple Suicide Attempts (last OD ~age 37), history of Self Injurious Behavior (Cutting- last age 82), and ~15 Psychiatric Hospitalizations (last- [facility] 07/2023).    Case was discussed in the multidisciplinary team. MAR was reviewed and patient was compliant with medications.  Julie received PRN Tylenol  x2, Clonidine  x2, Advil  x3, and Trazodone  yesterday.   Psychiatric Team made the following recommendations yesterday: -Start Zoloft  25 mg daily for depression and anxiety -Continue Risperdal  0.5 mg BID for mood stability and augmentation -Continue Augmentin  875-125 mg BID for 7 days (Day 2 of 7)    On interview today patient reports Julie slept good last night.  Julie reports her appetite is doing good.  Julie reports no SI, HI, or AVH.  Julie reports no Paranoia or Ideas of Reference.  Julie reports no issues with her medications.  Julie reports feeling better especially as Julie continues to work on things with her BF and they are talking more.  Discussed with her we would continue with the planned increase in Zoloft  and Julie was agreeable.  Discussed with her that Social Work is working on getting a PCP appointment scheduled today so it will be in place when Julie discharges.  Julie reports no other concerns at present.   Principal Problem: [name] (major depressive disorder), recurrent severe, without psychosis (HCC) Diagnosis: Principal Problem:   [name] (major depressive disorder), recurrent severe, without psychosis (HCC) Active Problems:   GAD (generalized anxiety disorder)   PTSD (post-traumatic stress disorder)  Total  Time spent with patient:  I personally spent 35 minutes on the unit in direct patient care. The direct patient care time included face-to-face time with the patient, reviewing the patient's chart, communicating with other professionals, and coordinating care.    Past Psychiatric History:  [name], GAD, PTSD, Polysubstance Abuse (Cocaine, THC), and a history of Bipolar Disorder, and multiple Suicide Attempts (last OD ~age 37), history of Self Injurious Behavior (Cutting- last age 68), and ~15 Psychiatric Hospitalizations (last- [facility]/2023).   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 No pertinent family history. Family Psychiatric  History:  Maternal Cousin-  Schizophrenia, Suicide Attempts Father- EtOH Abuse Multiple Cousins- Substance Abuse   Social History:  Social History   Substance and Sexual Activity  Alcohol  Use Yes   Comment: occasionally     Social History   Substance and Sexual Activity  Drug Use Yes   Types: Marijuana, Crack cocaine, MDMA (Ecstacy)   Comment: Crack, THC, Molly    Social  History   Socioeconomic History   Marital status: Single    Spouse name: Not on file   Number of children: Not on file   Years of education: Not on file   Highest education level: Not on file  Occupational History   Not on file  Tobacco Use   Smoking status: Every Day    Current packs/day: 0.50    Average packs/day: 0.5 packs/day for 18.0 years (9.0 ttl pk-yrs)    Types: Cigarettes   Smokeless tobacco: Never   Tobacco comments:    6 cig./day  Vaping Use   Vaping status: Never Used  Substance and Sexual Activity   Alcohol  use: Yes    Comment: occasionally   Drug use: Yes    Types: Marijuana, Crack cocaine, MDMA (Ecstacy)    Comment: Crack, THC, Molly   Sexual activity: Yes    Birth control/protection: I.U.D.  Other Topics Concern   Not on file  Social History Narrative   Not on file   Social Drivers of Health   Financial Resource Strain: Not on file  Food Insecurity: Food Insecurity Present (03/02/2024)   Hunger Vital Sign    Worried About Running Out of Food in the Last Year: Often true    Ran Out of Food in the Last Year: Often true  Transportation Needs: Unmet Transportation Needs (03/02/2024)   PRAPARE - Administrator, Civil Service (Medical): Yes    Lack of Transportation (Non-Medical): Yes  Physical Activity: Not on file  Stress: Not on file  Social Connections: Not on file   Additional Social History:                         Sleep: Good Estimated Sleeping Duration (Last 24 Hours): 5.50-6.75 hours  Appetite:  Good  Current Medications: Current Facility-Administered Medications  Medication Dose Route Frequency Provider Last Rate Last Admin   acetaminophen  (TYLENOL ) tablet 650 mg  650 mg Oral Q6H PRN Mannie Jerel PARAS, NP   650 mg at 03/04/24 1656   albuterol  (VENTOLIN  HFA) 108 (90 Base) MCG/ACT inhaler 1-2 puff  1-2 puff Inhalation Q6H PRN Raliegh Marsa RAMAN, DO   2 puff at 03/02/24 1805   alum & mag hydroxide-simeth  (MAALOX/MYLANTA) 200-200-20 MG/5ML suspension 30 mL  30 mL Oral Q4H PRN Mannie Jerel PARAS, NP       amLODipine  (NORVASC ) tablet 5 mg  5 mg Oral Daily Mirza Kidney S, DO   5 mg at 03/05/24 9146   amoxicillin -clavulanate (AUGMENTIN ) 875-125 MG per tablet 1 tablet  1 tablet Oral Q12H Blia Totman S, DO   1 tablet at 03/05/24 9147   cloNIDine  (CATAPRES ) tablet 0.1 mg  0.1 mg Oral TID PRN Varun Jourdan S, DO   0.1 mg at 03/04/24 1959   haloperidol  (HALDOL ) tablet 5 mg  5 mg Oral TID PRN Mannie Jerel PARAS, NP       And   diphenhydrAMINE  (BENADRYL ) capsule 50 mg  50 mg Oral TID PRN Mannie Jerel PARAS, NP       haloperidol  lactate (HALDOL ) injection 5  mg  5 mg Intramuscular TID PRN Mannie Jerel PARAS, NP       And   diphenhydrAMINE  (BENADRYL ) injection 50 mg  50 mg Intramuscular TID PRN Mannie Jerel PARAS, NP       And   LORazepam  (ATIVAN ) injection 2 mg  2 mg Intramuscular TID PRN Mannie Jerel PARAS, NP       haloperidol  lactate (HALDOL ) injection 10 mg  10 mg Intramuscular TID PRN Mannie Jerel PARAS, NP       And   diphenhydrAMINE  (BENADRYL ) injection 50 mg  50 mg Intramuscular TID PRN Mannie Jerel PARAS, NP       And   LORazepam  (ATIVAN ) injection 2 mg  2 mg Intramuscular TID PRN Mannie Jerel PARAS, NP       furosemide  (LASIX ) tablet 20 mg  20 mg Oral Daily Shinika Estelle S, DO   20 mg at 03/05/24 0853   ibuprofen  (ADVIL ) tablet 600 mg  600 mg Oral Q6H PRN Bobbitt, Shalon E, NP   600 mg at 03/04/24 2053   magnesium  hydroxide (MILK OF MAGNESIA) suspension 30 mL  30 mL Oral Daily PRN Mannie Jerel PARAS, NP       metFORMIN  (GLUCOPHAGE ) tablet 1,000 mg  1,000 mg Oral BID WC Raliegh Marsa RAMAN, DO   1,000 mg at 03/05/24 9147   nicotine  (NICODERM CQ  - dosed in mg/24 hours) patch 21 mg  21 mg Transdermal Q0600 Mannie Jerel PARAS, NP       potassium chloride  SA (KLOR-CON  M) CR tablet 20 mEq  20 mEq Oral Daily Whitley Strycharz S, DO   20 mEq at 03/05/24 9147   risperiDONE  (RISPERDAL ) tablet 0.5  mg  0.5 mg Oral BID Miabella Shannahan S, DO   0.5 mg at 03/05/24 9144   sertraline  (ZOLOFT ) tablet 50 mg  50 mg Oral Daily Suheyla Mortellaro S, DO   50 mg at 03/05/24 9147   traZODone  (DESYREL ) tablet 50 mg  50 mg Oral QHS PRN Ajibola, Ene A, NP   50 mg at 03/04/24 1959   white petrolatum  (VASELINE) gel             Lab Results:  Results for orders placed or performed during the hospital encounter of 03/02/24 (from the past 48 hours)  Hemoglobin A1c     Status: Abnormal   Collection Time: 03/04/24  6:40 AM  Result Value Ref Range   Hgb A1c MFr Bld 9.2 (H) 4.8 - 5.6 %    Comment: (NOTE) Diagnosis of Diabetes The following HbA1c ranges recommended by the American Diabetes Association (ADA) may be used as an aid in the diagnosis of diabetes mellitus.  Hemoglobin             Suggested A1C NGSP%              Diagnosis  <5.7                   Non Diabetic  5.7-6.4                Pre-Diabetic  >6.4                   Diabetic  <7.0                   Glycemic control for                       adults with diabetes.     Mean  Plasma Glucose 217.34 mg/dL    Comment: Performed at [facility], [address] [place], [place] [address]  Lipid panel     Status: Abnormal   Collection Time: 03/04/24  6:40 AM  Result Value Ref Range   Cholesterol 194 0 - 200 mg/dL    Comment:        ATP III CLASSIFICATION:  <200     mg/dL   Desirable  799-760  mg/dL   Borderline High  >=759    mg/dL   High           Triglycerides 169 (H) <150 mg/dL   HDL 55 >59 mg/dL   Total CHOL/HDL Ratio 3.6 RATIO   VLDL 34 0 - 40 mg/dL   LDL Cholesterol 894 (H) 0 - 99 mg/dL    Comment:        Total Cholesterol/HDL:CHD Risk Coronary Heart Disease Risk Table                     Men   Women  1/2 Average Risk   3.4   3.3  Average Risk       5.0   4.4  2 X Average Risk   9.6   7.1  3 X Average Risk  23.4   11.0        Use the calculated Patient Ratio above and the CHD Risk Table to determine the  patient's CHD Risk.        ATP III CLASSIFICATION (LDL):  <100     mg/dL   Optimal  899-870  mg/dL   Near or Above                    Optimal  130-159  mg/dL   Borderline  839-810  mg/dL   High  >809     mg/dL   Very High Performed at [facility], [address], [place], [place] [address]   TSH     Status: Abnormal   Collection Time: 03/04/24  6:40 AM  Result Value Ref Range   TSH 5.890 (H) 0.350 - 4.500 uIU/mL    Comment: Performed at [facility], [address] [place], [place] [address]  Comprehensive metabolic panel with GFR     Status: Abnormal   Collection Time: 03/04/24  6:40 AM  Result Value Ref Range   Sodium 143 135 - 145 mmol/L   Potassium 4.2 3.5 - 5.1 mmol/L   Chloride 106 98 - 111 mmol/L   CO2 26 22 - 32 mmol/L   Glucose, Bld 214 (H) 70 - 99 mg/dL    Comment: Glucose reference range applies only to samples taken after fasting for at least 8 hours.   BUN 10 6 - 20 mg/dL   Creatinine, Ser 9.26 0.44 - 1.00 mg/dL   Calcium  11.3 (H) 8.9 - 10.3 mg/dL   Total Protein 6.9 6.5 - 8.1 Julie [name]/dL   Albumin 3.9 3.5 - 5.0 Julie [name]/dL   AST 17 15 - 41 U/L   ALT 45 (H) 0 - 44 U/L   Alkaline Phosphatase 61 38 - 126 U/L   Total Bilirubin <0.2 0.0 - 1.2 mg/dL   GFR, Estimated >39 >39 mL/min    Comment: (NOTE) Calculated using the CKD-EPI Creatinine Equation (2021)    Anion gap 10 5 - 15    Comment: Performed at [facility], [address], [place], [place] [address]    Blood Alcohol   level:  Lab Results  Component Value Date   [facility] <15 03/01/2024   ETH <10 03/16/2023    Metabolic Disorder Labs: Lab Results  Component Value Date   HGBA1C 9.2 (H) 03/04/2024   MPG 217.34 03/04/2024   MPG 208.73 02/09/2024   No results found for: PROLACTIN Lab Results  Component Value Date   CHOL 194 03/04/2024   TRIG 169 (H) 03/04/2024   HDL 55 03/04/2024   CHOLHDL 3.6 03/04/2024   VLDL 34 03/04/2024   LDLCALC 105 (H) 03/04/2024     Physical Findings: AIMS:  ,  ,  ,  ,  ,  ,   CIWA:    COWS:     Musculoskeletal: Strength & Muscle Tone: within normal limits Gait & Station: normal Patient leans: N/A  Psychiatric Specialty Exam:  Presentation  General Appearance:  Appropriate for Environment; Casual  Eye Contact: Good  Speech: Clear and Coherent; Normal Rate  Speech Volume: Normal  Handedness: Right   Mood and Affect  Mood: -- (better)  Affect: Appropriate; Congruent   Thought Process  Thought Processes: Coherent; Goal Directed  Descriptions of Associations:Intact  Orientation:Full (Time, Place and Person)  Thought Content:Logical; WDL  History of Schizophrenia/Schizoaffective disorder:No  Duration of Psychotic Symptoms:No data recorded Hallucinations:Hallucinations: None  Ideas of Reference:None  Suicidal Thoughts:Suicidal Thoughts: No  Homicidal Thoughts:Homicidal Thoughts: No   Sensorium  Memory: Immediate Fair; Recent Fair  Judgment: Fair  Insight: Fair   Art therapist  Concentration: Good  Attention Span: Good  Recall: Good  Fund of Knowledge: Good  Language: Good   Psychomotor Activity  Psychomotor Activity: Psychomotor Activity: Normal   Assets  Assets: Communication Skills; Desire for Improvement; Resilience   Sleep  Sleep: Sleep: Good    Physical Exam: Physical Exam Vitals and nursing note reviewed.  Constitutional:      General: Julie is not in acute distress.    Appearance: Normal appearance. Julie is obese. Julie is not ill-appearing or toxic-appearing.  HENT:     Head: Normocephalic and atraumatic.  Pulmonary:     Effort: Pulmonary effort is normal.  Neurological:     General: No focal deficit present.     Mental Status: Julie is alert.    Review of Systems  Respiratory:  Negative for cough and shortness of breath.   Cardiovascular:  Negative for chest pain.  Gastrointestinal:  Negative for abdominal pain,  constipation, diarrhea, nausea and vomiting.  Neurological:  Negative for dizziness, weakness and headaches.  Psychiatric/Behavioral:  Negative for depression, hallucinations and suicidal ideas. The patient is not nervous/anxious.    Blood pressure (!) 164/100, pulse 80, temperature 98.2 F (36.8 C), temperature source Oral, resp. rate 20, height 5' 10 (1.778 m), weight 124.1 kg, SpO2 100%. Body mass index is 39.26 kg/m.   Treatment Plan Summary: Daily contact with patient to assess and evaluate symptoms and progress in treatment and Medication management   Ruhi Kopke Brissette is a 48 yr old female who presented to [facility] 0n 9/11 with complaints of depression and suicide attempt (volume overload with CHF), Julie was admitted to [facility] on [name].  Julie [name], GAD, PTSD, Polysubstance Abuse (Cocaine, THC), and a history of Bipolar Disorder, and multiple Suicide Attempts (last OD ~age 49), history of Self Injurious Behavior (Cutting- last age 1), and ~15 Psychiatric Hospitalizations (last- [facility]/20[facility]).     Najwa has tolerated starting Zoloft  and Metformin  without issue.  We will continue with the planned increase in Zoloft .  If Julie continues to do well we will plan for discharge tomorrow.  We will work with Social Work to get a PCP appointment scheduled given her multiple medical issues.  We will redraw a CMP tomorrow to monitor K and response to medications and will draw a Free T4.  We will continue to monitor.      [name], Recurrent, Severe, w/out Psychosis (r/out Schizoaffective Disorder)  GAD  PTSD: -Increase Zoloft  to 50 mg daily for depression and anxiety -Continue Risperdal  0.5 mg BID for mood stability and augmentation -Consider starting Zoloft  tomorrow for depression and anxiety -Continue Agitation Protocol: Haldol /Ativan /Benadryl      Asthma: -Continue Albuterol  1-2 puffs q6 PRN     Dental Infection: -Continue Augmentin  875-125 mg BID for 7 days (Day 3 of 7)      CHF: -Continue Lasix  20 mg daily -Continue Klor-Con  20 mEq daily -K: 4.2 on 9/14     HTN: -Continue Amlodipine  5 mg daily   Diabetes: -Start Metformin  1000 mg BID this evening -Follow up outpatient  Hypothyroid: -TSH: 5.890 -Free T4 ordered for 9/16 -Follow up outpatient     -Continue Clonidine  0.1 mg TID PRN  SBP > 160 or DBP > 100 -Continue PRN's: Tylenol , Maalox, Atarax , Milk of Magnesia, Trazodone    --  The risks/benefits/side-effects/alternatives to medications were discussed in detail with the patient and time was given for questions. The patient consents to medication trials.                -- Metabolic profile and EKG monitoring obtained while on an atypical antipsychotic (BMI: 39.26 Lipid Panel: HbgA1c: 9.2, EKG: NSR w/ QTc: 449)              -- Encouraged patient to participate in unit milieu and in scheduled group therapies              -- Short Term Goals: Ability to identify changes in lifestyle to reduce recurrence of condition will improve, Ability to verbalize feelings will improve, Ability to disclose and discuss suicidal ideas, Ability to demonstrate self-control will improve, Ability to identify and develop effective coping behaviors will improve, Ability to maintain clinical measurements within normal limits will improve, Compliance with prescribed medications will improve, and Ability to identify triggers associated with substance abuse/mental health issues will improve             -- Long Term Goals: Improvement in symptoms so as ready for discharge   Safety and Monitoring:             -- Voluntary admission to inpatient psychiatric unit for safety, stabilization and treatment             -- Daily contact with patient to assess and evaluate symptoms and progress in treatment             -- Patient's case to be discussed in multi-disciplinary team meeting             -- Observation Level : q15 minute checks             -- Vital signs:  q12 hours              -- Precautions: suicide, elopement, and assault  Discharge Planning:              -- Social work and case management to assist with discharge planning and identification of hospital follow-up needs prior to discharge             --  Estimated LOS: 1-2 more days             -- Discharge Concerns: Need to establish a safety plan; Medication compliance and effectiveness             -- Discharge Goals: Return home with outpatient referrals for mental health follow-up including medication management/psychotherapy   Marsa GORMAN Rosser, DO 03/05/2024, 12:58 PM

## 2024-03-05 NOTE — Inpatient Diabetes Management (Signed)
 Inpatient Diabetes Program Recommendations  AACE/ADA: New Consensus Statement on Inpatient Glycemic Control (2015)  Target Ranges:  Prepandial:   less than 140 mg/dL      Peak postprandial:   less than 180 mg/dL (1-2 hours)      Critically ill patients:  140 - 180 mg/dL   Lab Results  Component Value Date   GLUCAP 195 (H) 02/11/2024   HGBA1C 9.2 (H) 03/04/2024    Review of Glycemic Control  Latest Reference Range & Units 03/04/24 06:40  Glucose 70 - 99 mg/dL 785 (H)  (H): Data is abnormally high  Diabetes history: DM2 Outpatient Diabetes medications: Metformin  500 mg BID Current orders for Inpatient glycemic control: Metformin  1000 mg BID  Inpatient Diabetes Program Recommendations:    Please consider:  CBG's ac/hs.  If glucose trends > 180 mg/dL please consider Novolog  0-9 units TID and 0-5 units at bedtime.    Thank you, Wyvonna Pinal, MSN, CDCES Diabetes Coordinator Inpatient Diabetes Program [phone] (team pager from 8a-5p)

## 2024-03-05 NOTE — Group Note (Signed)
 Recreation Therapy Group Note   Group Topic:Communication  Group Date: 03/05/2024 Start Time: 0930 End Time: 1000 Facilitators: Annagrace Carr-McCall, LRT,CTRS Location: [address] Dayroom   Group Topic: Communication, Team Building, Problem Solving  Goal Area(s) Addresses:  Patient will effectively work with peer towards shared goal.  Patient will identify skills used to make activity successful.  Patient will identify how skills used during activity can be applied to reach post d/c goals.   Behavioral Response: Observant  Intervention: STEM Activity- Glass blower/designer  Activity: Tallest [employer]. In teams of 5-6, patients were given 11 craft pipe cleaners. Using the materials provided, patients were instructed to compete again the opposing team(s) to build the tallest free-standing structure from floor level. The activity was timed; difficulty increased by Clinical research associate as Production designer, theatre/television/film continued.  Systematically resources were removed with additional directions for example, placing one arm behind their back, working in silence, and shape stipulations. LRT facilitated post-activity discussion reviewing team processes and necessary communication skills involved in completion. Patients were encouraged to reflect how the skills utilized, or not utilized, in this activity can be incorporated to positively impact support systems post discharge.  Education: Pharmacist, community, Scientist, physiological, Discharge Planning   Education Outcome: Acknowledges education/In group clarification offered/Needs additional education.    Affect/Mood: Appropriate   Participation Level: None   Participation Quality: None   Behavior: On-looking   Speech/Thought Process: Relevant   Insight: None   Judgement: None   Modes of Intervention: STEM Activity   Patient Response to Interventions:  Attentive   Education Outcome:  In group clarification offered    Clinical Observations/Individualized  Feedback: Pt didn't participate in activity. Pt watch and observed as peers completed the activity.     Plan: Continue to engage patient in RT group sessions 2-3x/week.   Lauris Serviss-McCall, LRT,CTRS 03/05/2024 12:09 PM

## 2024-03-05 NOTE — BH IP Treatment Plan (Signed)
 Interdisciplinary Treatment and Diagnostic Plan Update  03/05/2024 Time of Session: 10:00AM Julie [name] MRN: [id number]  Principal Diagnosis: MDD (major depressive disorder), recurrent severe, without psychosis (HCC)  Secondary Diagnoses: Principal Problem:   MDD (major depressive disorder), recurrent severe, without psychosis (HCC) Active Problems:   GAD (generalized anxiety disorder)   PTSD (post-traumatic stress disorder)   Current Medications:  Current Facility-Administered Medications  Medication Dose Route Frequency Provider Last Rate Last Admin   acetaminophen  (TYLENOL ) tablet 650 mg  650 mg Oral Q6H PRN Mannie Jerel PARAS, NP   650 mg at 03/04/24 1656   albuterol  (VENTOLIN  HFA) 108 (90 Base) MCG/ACT inhaler 1-2 puff  1-2 puff Inhalation Q6H PRN Raliegh Marsa RAMAN, DO   2 puff at 03/02/24 1805   alum & mag hydroxide-simeth (MAALOX/MYLANTA) 200-200-20 MG/5ML suspension 30 mL  30 mL Oral Q4H PRN Mannie Jerel PARAS, NP       amLODipine  (NORVASC ) tablet 5 mg  5 mg Oral Daily Pashayan, Alexander S, DO   5 mg at 03/05/24 9146   amoxicillin -clavulanate (AUGMENTIN ) 875-125 MG per tablet 1 tablet  1 tablet Oral Q12H Pashayan, Alexander S, DO   1 tablet at 03/05/24 9147   cloNIDine  (CATAPRES ) tablet 0.1 mg  0.1 mg Oral TID PRN Pashayan, Alexander S, DO   0.1 mg at 03/04/24 1959   haloperidol  (HALDOL ) tablet 5 mg  5 mg Oral TID PRN Mannie Jerel PARAS, NP       And   diphenhydrAMINE  (BENADRYL ) capsule 50 mg  50 mg Oral TID PRN Mannie Jerel PARAS, NP       haloperidol  lactate (HALDOL ) injection 5 mg  5 mg Intramuscular TID PRN Mannie Jerel PARAS, NP       And   diphenhydrAMINE  (BENADRYL ) injection 50 mg  50 mg Intramuscular TID PRN Mannie Jerel PARAS, NP       And   LORazepam  (ATIVAN ) injection 2 mg  2 mg Intramuscular TID PRN Mannie Jerel PARAS, NP       haloperidol  lactate (HALDOL ) injection 10 mg  10 mg Intramuscular TID PRN Mannie Jerel PARAS, NP       And   diphenhydrAMINE  (BENADRYL )  injection 50 mg  50 mg Intramuscular TID PRN Mannie Jerel PARAS, NP       And   LORazepam  (ATIVAN ) injection 2 mg  2 mg Intramuscular TID PRN Mannie Jerel PARAS, NP       furosemide  (LASIX ) tablet 20 mg  20 mg Oral Daily Pashayan, Alexander S, DO   20 mg at 03/05/24 0853   ibuprofen  (ADVIL ) tablet 600 mg  600 mg Oral Q6H PRN Bobbitt, Shalon E, NP   600 mg at 03/04/24 2053   magnesium  hydroxide (MILK OF MAGNESIA) suspension 30 mL  30 mL Oral Daily PRN Mannie Jerel PARAS, NP       metFORMIN  (GLUCOPHAGE ) tablet 1,000 mg  1,000 mg Oral BID WC Pashayan, Alexander S, DO   1,000 mg at 03/05/24 9147   nicotine  (NICODERM CQ  - dosed in mg/24 hours) patch 21 mg  21 mg Transdermal Q0600 Mannie Jerel PARAS, NP       potassium chloride  SA (KLOR-CON  M) CR tablet 20 mEq  20 mEq Oral Daily Pashayan, Alexander S, DO   20 mEq at 03/05/24 9147   risperiDONE  (RISPERDAL ) tablet 0.5 mg  0.5 mg Oral BID Pashayan, Alexander S, DO   0.5 mg at 03/05/24 0855   sertraline  (ZOLOFT ) tablet 50 mg  50 mg Oral Daily Pashayan, Alexander  S, DO   50 mg at 03/05/24 9147   traZODone  (DESYREL ) tablet 50 mg  50 mg Oral QHS PRN Ajibola, Ene A, NP   50 mg at 03/04/24 1959   white petrolatum  (VASELINE) gel            PTA Medications: Medications Prior to Admission  Medication Sig Dispense Refill Last Dose/Taking   albuterol  (VENTOLIN  HFA) 108 (90 Base) MCG/ACT inhaler Inhale 1-2 puffs into the lungs every 6 (six) hours as needed for wheezing or shortness of breath. 36 g 0    amLODipine  (NORVASC ) 5 MG tablet Take 1 tablet (5 mg total) by mouth daily. 90 tablet 0    dapagliflozin  propanediol (FARXIGA ) 10 MG TABS tablet Take 1 tablet (10 mg total) by mouth daily before breakfast. 30 tablet 0    furosemide  (LASIX ) 40 MG tablet Take 1 tablet (40 mg total) by mouth daily. Follow up with your PCP for repeat labs within 1 week.  Follow up with PCP for refills. 30 tablet 0    losartan  (COZAAR ) 50 MG tablet Take 1 tablet (50 mg total) by mouth daily. 90  tablet 0    metFORMIN  (GLUCOPHAGE ) 500 MG tablet Take 1 tablet (500 mg total) by mouth 2 (two) times daily with a meal. (Titrate based on discharge instructions) 180 tablet 0    potassium chloride  (KLOR-CON  M) 10 MEQ tablet Take 2 tablets (20 mEq total) by mouth daily. Follow with your PCP for repeat labs in 1 week 60 tablet 0    umeclidinium-vilanterol (ANORO ELLIPTA ) 62.5-25 MCG/ACT AEPB Inhale 1 puff into the lungs daily. 60 each 0     Patient Stressors: Financial difficulties   Health problems   Medication change or noncompliance   Occupational concerns   Substance abuse   Traumatic event    Patient Strengths: Capable of independent living  Communication skills  Supportive family/friends   Treatment Modalities: Medication Management, Group therapy, Case management,  1 to 1 session with clinician, Psychoeducation, Recreational therapy.   Physician Treatment Plan for Primary Diagnosis: MDD (major depressive disorder), recurrent severe, without psychosis (HCC) Long Term Goal(s): Improvement in symptoms so as ready for discharge   Short Term Goals: Ability to identify changes in lifestyle to reduce recurrence of condition will improve Ability to verbalize feelings will improve Ability to disclose and discuss suicidal ideas Ability to demonstrate self-control will improve Ability to identify and develop effective coping behaviors will improve Ability to maintain clinical measurements within normal limits will improve Ability to identify triggers associated with substance abuse/mental health issues will improve  Medication Management: Evaluate patient's response, side effects, and tolerance of medication regimen.  Therapeutic Interventions: 1 to 1 sessions, Unit Group sessions and Medication administration.  Evaluation of Outcomes: Not Progressing  Physician Treatment Plan for Secondary Diagnosis: Principal Problem:   MDD (major depressive disorder), recurrent severe, without  psychosis (HCC) Active Problems:   GAD (generalized anxiety disorder)   PTSD (post-traumatic stress disorder)  Long Term Goal(s): Improvement in symptoms so as ready for discharge   Short Term Goals: Ability to identify changes in lifestyle to reduce recurrence of condition will improve Ability to verbalize feelings will improve Ability to disclose and discuss suicidal ideas Ability to demonstrate self-control will improve Ability to identify and develop effective coping behaviors will improve Ability to maintain clinical measurements within normal limits will improve Ability to identify triggers associated with substance abuse/mental health issues will improve     Medication Management: Evaluate patient's response, side effects,  and tolerance of medication regimen.  Therapeutic Interventions: 1 to 1 sessions, Unit Group sessions and Medication administration.  Evaluation of Outcomes: Not Progressing   RN Treatment Plan for Primary Diagnosis: MDD (major depressive disorder), recurrent severe, without psychosis (HCC) Long Term Goal(s): Knowledge of disease and therapeutic regimen to maintain health will improve  Short Term Goals: Ability to remain free from injury will improve, Ability to verbalize frustration and anger appropriately will improve, Ability to demonstrate self-control, Ability to participate in decision making will improve, Ability to verbalize feelings will improve, Ability to disclose and discuss suicidal ideas, Ability to identify and develop effective coping behaviors will improve, and Compliance with prescribed medications will improve  Medication Management: RN will administer medications as ordered by provider, will assess and evaluate patient's response and provide education to patient for prescribed medication. RN will report any adverse and/or side effects to prescribing provider.  Therapeutic Interventions: 1 on 1 counseling sessions, Psychoeducation, Medication  administration, Evaluate responses to treatment, Monitor vital signs and CBGs as ordered, Perform/monitor CIWA, COWS, AIMS and Fall Risk screenings as ordered, Perform wound care treatments as ordered.  Evaluation of Outcomes: Not Progressing   LCSW Treatment Plan for Primary Diagnosis: MDD (major depressive disorder), recurrent severe, without psychosis (HCC) Long Term Goal(s): Safe transition to appropriate next level of care at discharge, Engage patient in therapeutic group addressing interpersonal concerns.  Short Term Goals: Engage patient in aftercare planning with referrals and resources, Increase social support, Increase ability to appropriately verbalize feelings, Increase emotional regulation, Facilitate acceptance of mental health diagnosis and concerns, Facilitate patient progression through stages of change regarding substance use diagnoses and concerns, Identify triggers associated with mental health/substance abuse issues, and Increase skills for wellness and recovery  Therapeutic Interventions: Assess for all discharge needs, 1 to 1 time with Social worker, Explore available resources and support systems, Assess for adequacy in community support network, Educate family and significant other(s) on suicide prevention, Complete Psychosocial Assessment, Interpersonal group therapy.  Evaluation of Outcomes: Not Progressing   Progress in Treatment: Attending groups: Yes. Participating in groups: Yes. Taking medication as prescribed: Yes. Toleration medication: Yes. Family/Significant other contact made: No, will contact:  family/friends once consent is given. Patient understands diagnosis: Yes. Discussing patient identified problems/goals with staff: Yes. Medical problems stabilized or resolved: Yes. Denies suicidal/homicidal ideation: Yes. Issues/concerns per patient self-inventory: No.  Patient Goals:  to get back on my medicines  Discharge Plan or Barriers: Patient likely  to discharge to shelter once stable.   Reason for Continuation of Hospitalization: Anxiety Hallucinations Medication stabilization  Estimated Length of Stay: 5-7 days  Last 3 [place] Suicide Severity Risk Score: Flowsheet Row Admission (Current) [facility] from 02/28/2024 in [facility] ED to Hosp-Admission (Discharged) from 02/08/2024 in [facility] 3[facility] HF PCU  C-SSRS RISK CATEGORY High Risk No Risk No Risk    Last PHQ 2/9 Scores:    03/05/2019    3:28 PM  Depression screen PHQ 2/9  Decreased Interest 1  Down, Depressed, Hopeless 2  PHQ - 2 Score 3  Altered sleeping 3  Tired, decreased energy 3  Change in appetite 3  Feeling bad or failure about yourself  3  Trouble concentrating 1  Moving slowly or fidgety/restless 2  Suicidal thoughts 1  PHQ-9 Score 19    Scribe for Treatment Team: Monike Bragdon M Loraina Stauffer, LCSWA 03/05/2024 10:36 AM

## 2024-03-05 NOTE — Plan of Care (Signed)
   Problem: Education: Goal: Emotional status will improve Outcome: Progressing   Problem: Activity: Goal: Sleeping patterns will improve Outcome: Progressing

## 2024-03-05 NOTE — Group Note (Signed)
 Occupational Therapy Group Note  Group Topic: Sleep Hygiene  Group Date: 03/05/2024 Start Time: 1500 End Time: 1535 Facilitators: Dot [place] MATSU, OT   Group Description: Group encouraged increased participation and engagement through topic focused on sleep hygiene. Patients reflected on the quality of sleep they typically receive and identified areas that need improvement. Group was given background information on sleep and sleep hygiene, including common sleep disorders. Group members also received information on how to improve one's sleep and introduced a sleep diary as a tool that can be utilized to track sleep quality over a length of time. Group session ended with patients identifying one or more strategies they could utilize or implement into their sleep routine in order to improve overall sleep quality.        Therapeutic Goal(s):  Identify one or more strategies to improve overall sleep hygiene  Identify one or more areas of sleep that are negatively impacted (sleep too much, too little, etc)     Participation Level: Engaged   Participation Quality: Independent   Behavior: Appropriate   Speech/Thought Process: Relevant   Affect/Mood: Appropriate   Insight: Fair   Judgement: Fair      Modes of Intervention: Education  Patient Response to Interventions:  Attentive   Plan: Continue to engage patient in OT groups 2 - 3x/week.  03/05/2024  [place] MATSU Dot, OT Julie [name], OT

## 2024-03-05 NOTE — Progress Notes (Signed)
 Tour of Duty:  Prentice JINNY Angle, RN, 03/05/24, Tour of Duty: 0700-1900  SI/HI/AVH: Denies  Self-Reported   Mood: Negative  Anxiety: Denies, but Observable Depression: Denies, but Observable Irritability: Denies  Broset  Violence Prevention Guidelines *See Row Information*: Small Violence Risk interventions implemented   LBM  Last BM Date : 03/04/24   Pain: not present  Patient Refusals (including Rx): No  Shift Summary: Patient observed to be calm on unit. Patient able to make needs known. Patient observed to engage appropriately with staff and peers. Patient suspected to be minimizing symptoms in order to discharge. Patient taking medications as prescribed. This shift, no PRN medication requested or required. No observed or reported side effects to medication. No observed or reported agitation, aggression, or other acute emotional distress. No observed or reported physical abnormalities or concerns. Patient is requesting pregnancy test, 4 days ago patient was negative, LP aware.   Last Vitals  Vitals Weight: 124.1 kg Temp: 98.2 F (36.8 C) Temp Source: Oral Pulse Rate: 76 Resp: 16 BP: (!) 154/87 Patient Position: (not recorded)  Admission Type  Psych Admission Type (Psych Patients Only) Admission Status: Voluntary Date 72 hour document signed : (not recorded) Time 72 hour document signed : (not recorded) Provider Notified (First and Last Name) (see details for LINK to note): (not recorded)   Psychosocial Assessment  Psychosocial Assessment Patient Complaints: None Eye Contact: Fair Facial Expression: Worried Affect: Depressed, Anxious Speech: Logical/coherent Interaction: Minimal Motor Activity: Other (Comment) (WDL) Appearance/Hygiene: Unremarkable Behavior Characteristics: Appropriate to situation, Anxious Mood: Anxious   Aggressive Behavior  Targets: (not recorded)   Thought Process  Thought Process Coherency: Within Defined Limits Content: Within  Defined Limits Delusions: None reported or observed Perception: Within Defined Limits Hallucination: None reported or observed Judgment: Limited Confusion: None  Danger to Self/Others  Danger to Self Current suicidal ideation?: Denies Description of Suicide Plan: (not recorded) Self-Injurious Behavior: 1 Agreement Not to Harm Self: (not recorded) Description of Agreement: (not recorded) Danger to Others: None reported or observed

## 2024-03-06 DIAGNOSIS — F332 Major depressive disorder, recurrent severe without psychotic features: Principal | ICD-10-CM

## 2024-03-06 DIAGNOSIS — F4310 Post-traumatic stress disorder, unspecified: Secondary | ICD-10-CM

## 2024-03-06 DIAGNOSIS — F411 Generalized anxiety disorder: Secondary | ICD-10-CM

## 2024-03-06 LAB — COMPREHENSIVE METABOLIC PANEL WITH GFR
ALT: 15 U/L (ref 0–44)
AST: 24 U/L (ref 15–41)
Albumin: 4.5 g/dL (ref 3.5–5.0)
Alkaline Phosphatase: 65 U/L (ref 38–126)
Anion gap: 13 (ref 5–15)
BUN: 12 mg/dL (ref 6–20)
CO2: 24 mmol/L (ref 22–32)
Calcium: 12.2 mg/dL — ABNORMAL HIGH (ref 8.9–10.3)
Chloride: 102 mmol/L (ref 98–111)
Creatinine, Ser: 0.68 mg/dL (ref 0.44–1.00)
GFR, Estimated: >60 mL/min (ref >60)
Glucose, Bld: 184 mg/dL — ABNORMAL HIGH (ref 70–99)
Potassium: 4.6 mmol/L (ref 3.5–5.1)
Sodium: 139 mmol/L (ref 135–145)
Total Bilirubin: 0.4 mg/dL (ref 0.0–1.2)
Total Protein: 8 g/dL (ref 6.5–8.1)

## 2024-03-06 LAB — T4, FREE: Free T4: 0.82 ng/dL (ref 0.61–1.12)

## 2024-03-06 MED ORDER — LOSARTAN POTASSIUM 50 MG PO TABS: 50.0000 mg | ORAL_TABLET | Freq: Every day | ORAL | 0 refills | Status: DC

## 2024-03-06 MED ORDER — SERTRALINE HCL 50 MG PO TABS: 50.0000 mg | ORAL_TABLET | Freq: Every day | ORAL | 0 refills | Status: DC

## 2024-03-06 MED ORDER — NICOTINE 21 MG/24HR TD PT24: 21.0000 mg | MEDICATED_PATCH | Freq: Every day | TRANSDERMAL | 0 refills | Status: DC

## 2024-03-06 MED ORDER — AMLODIPINE BESYLATE 5 MG PO TABS: 5.0000 mg | ORAL_TABLET | Freq: Every day | ORAL | 0 refills | Status: DC

## 2024-03-06 MED ORDER — FUROSEMIDE 20 MG PO TABS: 20.0000 mg | ORAL_TABLET | Freq: Every day | ORAL | 0 refills | Status: DC

## 2024-03-06 MED ORDER — AMOXICILLIN-POT CLAVULANATE 875-125 MG PO TABS: 1.0000 | ORAL_TABLET | Freq: Two times a day (BID) | ORAL | 0 refills | Status: AC

## 2024-03-06 MED ORDER — RISPERIDONE 0.5 MG PO TABS: 0.5000 mg | ORAL_TABLET | Freq: Two times a day (BID) | ORAL | 0 refills | Status: DC

## 2024-03-06 MED ORDER — METFORMIN HCL 1000 MG PO TABS: 1000.0000 mg | ORAL_TABLET | Freq: Two times a day (BID) | ORAL | 0 refills | Status: DC

## 2024-03-06 NOTE — Progress Notes (Signed)
 Patient discharged off unit at 1130. Patient belongings reviewed and acknowledged by patient. AVS and Transition Record reviewed and acknowledged by patient. Safety plan completed by patient and copy provided. Patient denies SI, plan or intent. Denies HI. Denies AVH. No observed or reported side effects to medication. No observed or reported agitation, aggression, or other acute emotional distress. No observed or reported physical abnormalities or concerns. Patient transportation from facility verified and observed.

## 2024-03-06 NOTE — Discharge Summary (Signed)
 Physician Discharge Summary Note  Patient:  Julie [name] is an 48 y.o., female MRN:  [id number] DOB:  [birth year] Patient phone:  [phone] (home)  Patient address:   [address] [place] [address],  Total Time spent with patient: 35 minutes   Date of Admission:  03/02/2024 Date of Discharge: 03/06/24  Reason for Admission:  SI  Principal Problem: MDD (major depressive disorder), recurrent severe, without psychosis (HCC) Discharge Diagnoses: Principal Problem:   MDD (major depressive disorder), recurrent severe, without psychosis (HCC) Active Problems:   GAD (generalized anxiety disorder)   PTSD (post-traumatic stress disorder)   Identifying Information and Past Psychiatric History:  The patient is a 48 y.o. female with a psychiatric history most consistent with major depressive disorder, PTSD, stimulant use disorder (cocaine) and GAD who was admitted for SI in the context of psychosocial stressors (recent breakup and subsequent homelessness)   Psychiatric history notable for recurrent depressive symptoms, substance use (cocaine and THC). For combination of low mood and SI concerns she has been hospitalized approximately 15 times, most most recently at [facility] in February 2025. She had several suicide attempts in adolescence/early adult life, but the most recent was at age 55 via OD. Similarly, she has a history of SIB by cutting that she has note engaged in since age 54. Regarding substance use, on this admission she endorsed cocaine use 2-3 times a week and occasional THC.    Hospital Course: Patient presented to [facility] for SI without plan in the context of being put out of her home after a breakup with her boyfriend. Presenting as irritable and endorsing vague HI towards boyfriend, although denied actual thoughts of acting on this. Endorsed depressive symptoms of poor sleep, however otherwise provided a limited interview. She was transferred voluntarily to [facility] as of  03/02/24  On the floor, the patient was noted to be more engaged in exam, voicing ongoing passive SI with no intent or plan, denying HI, and presenting with dysphoric affect. She endorsed anger issues as a large problem in her life. Although noted there was little evidence for a primary bipolar disorder, mood lability (either related to personality, PTSD, substance use or some combination of all) was noted to be an issues and risperidone  0.5 BID was initiated for this. The following day zoloft  was started at 50 mg daily to address anxiety and PTSD symptoms. She was continued on augmentin  for dental infection and continued on other home medications.   By 9/14 the patient noted improvements in mood with the risperidone . Additionally noted that she had reconciled with her boyfriend, which had similarly been helpful in improving her mood. She was denying SI, HI and AVH. Over the next 2 days the patient was noted to be calm, cooperative, taking medications and denying all concerns aside from ongoing tooth and jaw pain. She was attending groups and continuing to work on communication with her partner. She began to deny depressive symptoms and was largely focused on ongoing medical issues that needed to be addressed outpatient and plan was made to assist her in scheduling PCP appointment after discharge. By 9/15 the patient was denying all mood concerns and requesting to leave the hospital. Plan was for discharge the next morning.  Interview today: Today the patient reports that she is much better. States that when she came into the hospital everything was coming at her from all angles - family issues, conflict, and her own low mood and anger culminating in breakup with her  boyfriend. She has since calmed and has been working with her ex in making a strategy for how to manage her episodes - she is working on this as well. Plan for today is to go home with her sister, but will likely stay overnight with her  brother as he has surgery in the morning. Tomorrow, her son is also getting out of prison after 7 years and she wants to be there to see him when he is out. She reports the medication seems to have helped a lot in keeping her mood steady. She has not had suicidal thoughts in a few days, and feels time and space have helped her have the mental break from life stressors that she needed. She is still hoping to leave the hospital today, however, due to needing to be there for family. No SI, HI or AVH. No additional concerns.   Behavior on unit: Throughout her time at [facility] the patient was cooperative with care, medication compliant, attended groups, and was engaging well with peers and treatment plan. No psychotic or manic symptoms were observed. No PRNs for agitation were required. By the end of the stay she was free from SI, HI AVH, was demonstrated improvements in affect and reporting clear future orientation. Overall discharged in stable and improved condition into the care of family with follow up scheduled as noted below. Instructions to call [phone] or [phone] if in crisis, or present to the nearest ED.     Past Medical History:  Past Medical History:  Diagnosis Date   Asthma    Diabetes mellitus without complication (HCC)    Fracture, humerus closed, shaft 07/11/2014   left   History of bronchitis    Mental health problem    Open fracture of great toe of left foot 12/25/2015    Past Surgical History:  Procedure Laterality Date   CESAREAN SECTION     ECTOPIC PREGNANCY SURGERY  2003   INCISION AND DRAINAGE Left 12/25/2015   Procedure: INCISION AND DRAINAGE With  Amputation of distal tip of Left Great and Second Toe.;  Surgeon: Kay CHRISTELLA Cummins, MD;  Location: [facility];  Service: Orthopedics;  Laterality: Left;   LIPOMA EXCISION     chest   ORIF HUMERUS FRACTURE Left 07/15/2014   Procedure: OPEN REDUCTION INTERNAL FIXATION (ORIF) LEFT HUMERAL SHAFT ;  Surgeon: Evalene JONETTA Chancy, MD;  Location: [facility];  Service: Orthopedics;  Laterality: Left;   TUBAL LIGATION  2003   Family History: History reviewed. No pertinent family history.  Social History:  Social History   Substance and Sexual Activity  Alcohol  Use Yes   Comment: occasionally     Social History   Substance and Sexual Activity  Drug Use Yes   Types: Marijuana, Crack cocaine, MDMA (Ecstacy)   Comment: Crack, THC, Molly    Social History   Socioeconomic History   Marital status: Single    Spouse name: Not on file   Number of children: Not on file   Years of education: Not on file   Highest education level: Not on file  Occupational History   Not on file  Tobacco Use   Smoking status: Every Day    Current packs/day: 0.50    Average packs/day: 0.5 packs/day for 18.0 years (9.0 ttl pk-yrs)    Types: Cigarettes   Smokeless tobacco: Never   Tobacco comments:    6 cig./day  Vaping Use   Vaping status: Never Used  Substance and Sexual Activity  Alcohol  use: Yes    Comment: occasionally   Drug use: Yes    Types: Marijuana, Crack cocaine, MDMA (Ecstacy)    Comment: Crack, THC, Molly   Sexual activity: Yes    Birth control/protection: I.U.D.  Other Topics Concern   Not on file  Social History Narrative   Not on file   Social Drivers of Health   Financial Resource Strain: Not on file  Food Insecurity: Food Insecurity Present (03/02/2024)   Hunger Vital Sign    Worried About Running Out of Food in the Last Year: Often true    Ran Out of Food in the Last Year: Often true  Transportation Needs: Unmet Transportation Needs (03/02/2024)   PRAPARE - Administrator, Civil Service (Medical): Yes    Lack of Transportation (Non-Medical): Yes  Physical Activity: Not on file  Stress: Not on file  Social Connections: Not on file    Objective:  Mental Status exam: Appearance: black female, overweight, dressed in hospital gown and socks, sandals on feet, appropriately groomed with hair  pulled back  Eye contact: good  Attitude towards examiner cooperative, friendly Psychomotor: no agitation or retardation  Speech: normal in rate, rhythm and prosody  Language: no delays  Mood: good - better  Affect: congruent, euthymic and reactive  Thought content: denying SI and HI, no delusions expressed  Thought Process: linear, organized and goal directed  Perception: denying AVH, not RTIS  Insight: good  Judgement: good   Orientation: x4 Attention/Concentration: good - attends to interview  Memory/Cognition: grossly intact based on conversation   Fund of Knowledge: Average   Musculoskeletal: Strength & Muscle Tone: within normal limits Gait & Station: steady  Patient leans: N/A   Physical Exam Constitutional:      Appearance: Normal appearance.  Eyes:     Extraocular Movements: Extraocular movements intact.  Pulmonary:     Effort: Pulmonary effort is normal.     Breath sounds: Normal breath sounds.  Abdominal:     General: There is no distension.  Musculoskeletal:        General: Normal range of motion.  Neurological:     General: No focal deficit present.     Mental Status: She is alert.    ROS Blood pressure (!) 170/103, pulse 78, temperature 98.6 F (37 C), temperature source Oral, resp. rate 18, height 5' 10 (1.778 m), weight 124.1 kg, SpO2 100%. Body mass index is 39.26 kg/m.   Social History   Tobacco Use  Smoking Status Every Day   Current packs/day: 0.50   Average packs/day: 0.5 packs/day for 18.0 years (9.0 ttl pk-yrs)   Types: Cigarettes  Smokeless Tobacco Never  Tobacco Comments   6 cig./day   Tobacco Cessation:  A prescription for an FDA-approved tobacco cessation medication provided at discharge   Blood Alcohol  level:  Lab Results  Component Value Date   [facility] <15 03/01/2024   ETH <10 03/16/2023    Metabolic Disorder Labs:  Lab Results  Component Value Date   HGBA1C 9.2 (H) 03/04/2024   MPG 217.34 03/04/2024   MPG 208.73  02/09/2024   No results found for: PROLACTIN Lab Results  Component Value Date   CHOL 194 03/04/2024   TRIG 169 (H) 03/04/2024   HDL 55 03/04/2024   CHOLHDL 3.6 03/04/2024   VLDL 34 03/04/2024   LDLCALC 105 (H) 03/04/2024    See Psychiatric Specialty Exam and Suicide Risk Assessment completed by Attending Physician prior to discharge.  Discharge destination:  home with family    Allergies as of 03/06/2024   No Known Allergies      Medication List     TAKE these medications      Indication  amLODipine  5 MG tablet Commonly known as: NORVASC  Take 1 tablet (5 mg total) by mouth daily.  Indication: High Blood Pressure   amoxicillin -clavulanate 875-125 MG tablet Commonly known as: AUGMENTIN  Take 1 tablet by mouth every 12 (twelve) hours for 4 days.  Indication: Gum Inflammation   Anoro Ellipta  62.5-25 MCG/ACT Aepb Generic drug: umeclidinium-vilanterol Inhale 1 puff into the lungs daily.  Indication: Chronic Obstructive Lung Disease   Farxiga  10 MG Tabs tablet Generic drug: dapagliflozin  propanediol Take 1 tablet (10 mg total) by mouth daily before breakfast.  Indication: Type 2 Diabetes   furosemide  20 MG tablet Commonly known as: LASIX  Take 1 tablet (20 mg total) by mouth daily. What changed:  medication strength how much to take additional instructions  Indication: High Blood Pressure   losartan  50 MG tablet Commonly known as: COZAAR  Take 1 tablet (50 mg total) by mouth daily.  Indication: High Blood Pressure   metFORMIN  1000 MG tablet Commonly known as: GLUCOPHAGE  Take 1 tablet (1,000 mg total) by mouth 2 (two) times daily with a meal. What changed:  medication strength how much to take additional instructions  Indication: Type 2 Diabetes   nicotine  21 mg/24hr patch Commonly known as: NICODERM CQ  - dosed in mg/24 hours Place 1 patch (21 mg total) onto the skin daily at 6 (six) AM.  Indication: Nicotine  Addiction   potassium chloride  10 MEQ  tablet Commonly known as: KLOR-CON  M Take 2 tablets (20 mEq total) by mouth daily. Follow with your PCP for repeat labs in 1 week  Indication: Low Amount of Potassium in the Blood   risperiDONE  0.5 MG tablet Commonly known as: RISPERDAL  Take 1 tablet (0.5 mg total) by mouth 2 (two) times daily.  Indication: Major Depressive Disorder   sertraline  50 MG tablet Commonly known as: ZOLOFT  Take 1 tablet (50 mg total) by mouth daily.  Indication: Major Depressive Disorder, Posttraumatic Stress Disorder   Ventolin  HFA 108 (90 Base) MCG/ACT inhaler Generic drug: albuterol  Inhale 1-2 puffs into the lungs every 6 (six) hours as needed for wheezing or shortness of breath.  Indication: Asthma        Follow-up Information     [place] Go on 03/07/2024.   Specialty: Professional Counselor Why: Please go to this provider on 03/07/24 at 9:00 am for an assessment, to receive therapy services.  You may also go Monday through Friday, from 9 am to 1 pm. Contact information: [address]  [address] [place] [address] [phone]         [facility]. Go on 03/08/2024.   Specialty: Behavioral Health Why: Please go to this provider on 03/08/24 at 7:00 am for an assessment, to receive medication management services.  You may also go on Monday through Friday, arrive by 7:00 am. Contact information: [address] [place]  [address] [phone]         [facility]. Schedule an appointment as soon as possible for a visit.   Why: Please call to schedule a hospital follow up appointment as soon as possible, or go to this provider to pick up a new patient packet, fill out and return, to receive a new patient appointment for primary care services. Contact information: [address]   [address] [phone]                 Signed: Leita LOISE Arts, MD 03/06/2024, 9:19  AM

## 2024-03-06 NOTE — Inpatient Diabetes Management (Signed)
 Inpatient Diabetes Program Recommendations  AACE/ADA: New Consensus Statement on Inpatient Glycemic Control (2015)  Target Ranges:  Prepandial:   less than 140 mg/dL      Peak postprandial:   less than 180 mg/dL (1-2 hours)      Critically ill patients:  140 - 180 mg/dL   Lab Results  Component Value Date   GLUCAP 195 (H) 02/11/2024   HGBA1C 9.2 (H) 03/04/2024    Diabetes history: DM2 Outpatient Diabetes medications: Metformin  500 mg BID Current orders for Inpatient glycemic control: Metformin  1000 mg BID   Inpatient Diabetes Program Recommendations:     Please consider:   CBG's ac/hs.  If glucose trends > 180 mg/dL please consider Novolog  0-9 units TID and 0-5 units at bedtime.     Thank you, Wyvonna Pinal, MSN, CDCES Diabetes Coordinator Inpatient Diabetes Program [phone] (team pager from 8a-5p)

## 2024-03-06 NOTE — Progress Notes (Signed)
   03/06/24 0300  Psych Admission Type (Psych Patients Only)  Admission Status Voluntary  Psychosocial Assessment  Patient Complaints None  Eye Contact Fair  Facial Expression Worried  Affect Anxious;Depressed  Speech Logical/coherent  Interaction Minimal  Motor Activity Other (Comment) (WDL)  Appearance/Hygiene Unremarkable  Behavior Characteristics Appropriate to situation;Anxious  Mood Anxious  Aggressive Behavior  Effect No apparent injury  Thought Process  Coherency WDL  Content WDL  Delusions None reported or observed  Perception WDL  Hallucination None reported or observed  Judgment Limited  Confusion None  Danger to Self  Current suicidal ideation? Denies  Self-Injurious Behavior No self-injurious ideation or behavior indicators observed or expressed   Agreement Not to Harm Self Yes  Description of Agreement verbal  Danger to Others  Danger to Others None reported or observed

## 2024-03-06 NOTE — Group Note (Signed)
 Recreation Therapy Group Note   Group Topic:Animal Assisted Therapy   Group Date: 03/06/2024 Start Time: 0947 End Time: 1030 Facilitators: Avigail Pilling-McCall, LRT,CTRS Location: [address] Dayroom   Animal-Assisted Activity (AAA) Program Checklist/Progress Notes Patient Eligibility Criteria Checklist & Daily Group note for Rec Tx Intervention  AAA/T Program Assumption of Risk Form signed by Patient/ or Parent Legal Guardian Yes  Patient understands his/her participation is voluntary Yes  Behavioral Response:    Education: Charity fundraiser, Appropriate Animal Interaction   Education Outcome: Acknowledges education.    Affect/Mood: N/A   Participation Level: Did not attend    Clinical Observations/Individualized Feedback:      Plan: Continue to engage patient in RT group sessions 2-3x/week.   Conlee Sliter-McCall, LRT,CTRS 03/06/2024 11:45 AM

## 2024-03-06 NOTE — Plan of Care (Signed)
  Problem: Coping: Goal: Ability to verbalize frustrations and anger appropriately will improve Outcome: Progressing Goal: Ability to demonstrate self-control will improve Outcome: Progressing   Problem: Health Behavior/Discharge Planning: Goal: Identification of resources available to assist in meeting health care needs will improve Outcome: Progressing Goal: Compliance with treatment plan for underlying cause of condition will improve Outcome: Progressing

## 2024-03-06 NOTE — BHH Suicide Risk Assessment (Signed)
 [facility] Discharge Suicide Risk Assessment   Principal Problem: MDD (major depressive disorder), recurrent severe, without psychosis (HCC) Discharge Diagnoses: Principal Problem:   MDD (major depressive disorder), recurrent severe, without psychosis (HCC) Active Problems:   GAD (generalized anxiety disorder)   PTSD (post-traumatic stress disorder)   Total Time spent with patient: 74  Suicide Risk: The patient presented with acute risk factors for suicide including depressed mood, suicidal thoughts, and psychosocial stressors (breakup and loss of housing). She additionally carries chronic, non-modifiable risk factors including history of prior admissions, history of depression, history of suicide attempts, history of substance use (cocaine), poor coping. To address acute risk factors she was admitted voluntarily to [facility] and started on medications aimed at addressing mood lability as well as depressive symptoms. Throughout admission she had improvements in mood and affect, denying SI by 9/14 and overall engaging in groups and other aspects of care. She additionally reconciled with her partner, which was a major source of stress and contributed significantly to SI. On day of discharge she was denying SI, presenting as euthymic and expressing future orientation, including plans to take care of physical concerns, which is inconsistent with a desire to die. She requested to leave the hospital and does not meet criteria for an involuntary hold. . Although she is at chronically elevated risk of suicide in comparison to the general population, current and short term risk of suicide is considered low.   The most appropriate and least restrictive enviornment is the outpatient setting. Recommend following up with mental health and PCP appointments as documented below. Additionally we have encouraged ongoing sobriety.     Follow-up Information     [place] Go on 03/07/2024.   Specialty:  Professional Counselor Why: Please go to this provider on 03/07/24 at 9:00 am for an assessment, to receive therapy services.  You may also go Monday through Friday, from 9 am to 1 pm. Contact information: [address]  [address] [place] [address] [phone]         [facility]. Go on 03/08/2024.   Specialty: Behavioral Health Why: Please go to this provider on 03/08/24 at 7:00 am for an assessment, to receive medication management services.  You may also go on Monday through Friday, arrive by 7:00 am. Contact information: [address] [place]  [address] [phone]        [facility]. Schedule an appointment as soon as possible for a visit.   Why: Please call to schedule a hospital follow up appointment as soon as possible, or go to this provider to pick up a new patient packet, fill out and return, to receive a new patient appointment for primary care services. Contact information: [address] [place]  [address] [phone]                Leita LOISE Arts, MD 03/06/2024, 8:17 AM

## 2024-03-06 NOTE — Progress Notes (Signed)
  [facility] Adult Case Management Discharge Plan :  Will you be returning to the same living situation after discharge:  Yes,  patient will be returning home to address on file. At discharge, do you have transportation home?: Yes,  patient will be receiving transportation from sister, [name].  Do you have the ability to pay for your medications: Yes,  patient has active health insurance.  Release of information consent forms completed and in the chart;  Patient's signature needed at discharge.  Patient to Follow up at:  Follow-up Information     [place] Go on 03/07/2024.   Specialty: Professional Counselor Why: Please go to this provider on 03/07/24 at 9:00 am for an assessment, to receive therapy services.  You may also go Monday through Friday, from 9 am to 1 pm. Contact information: [address]  [address] [place] [address] [phone]         [facility]. Go on 03/08/2024.   Specialty: Behavioral Health Why: Please go to this provider on 03/08/24 at 7:00 am for an assessment, to receive medication management services.  You may also go on Monday through Friday, arrive by 7:00 am. Contact information: [address]  [phone]        [facility]. Schedule an appointment as soon as possible for a visit.   Why: Please call to schedule a hospital follow up appointment as soon as possible, or go to this provider to pick up a new patient packet, fill out and return, to receive a new patient appointment for primary care services. Contact information: [address] [place]  [phone]                Next level of care provider has access to [facility] Link:no  Safety Planning and Suicide Prevention discussed: Yes,  completed with patient's sister, [name] [phone]     Has patient been referred to the  Quitline?: Patient refused referral for treatment  Patient has been referred for addiction treatment: Patient refused referral for treatment.  [name], LCSWA 03/06/2024, 9:22 AM

## 2024-03-15 ENCOUNTER — Emergency Department (HOSPITAL_COMMUNITY)
Admission: EM | Admit: 2024-03-15 | Discharge: 2024-03-15 | Disposition: A | Discharge status: Home / Self Care | Payer: MEDICAID | Attending: Emergency Medicine | Admitting: Emergency Medicine

## 2024-03-15 ENCOUNTER — Encounter (HOSPITAL_COMMUNITY): Payer: Self-pay | Admitting: *Deleted

## 2024-03-15 ENCOUNTER — Other Ambulatory Visit: Payer: Self-pay

## 2024-03-15 DIAGNOSIS — Z79899 Other long term (current) drug therapy: Secondary | ICD-10-CM | POA: Diagnosis not present

## 2024-03-15 DIAGNOSIS — Z7951 Long term (current) use of inhaled steroids: Secondary | ICD-10-CM | POA: Insufficient documentation

## 2024-03-15 DIAGNOSIS — R0602 Shortness of breath: Secondary | ICD-10-CM | POA: Diagnosis present

## 2024-03-15 DIAGNOSIS — Z7984 Long term (current) use of oral hypoglycemic drugs: Secondary | ICD-10-CM | POA: Insufficient documentation

## 2024-03-15 DIAGNOSIS — K0889 Other specified disorders of teeth and supporting structures: Secondary | ICD-10-CM | POA: Insufficient documentation

## 2024-03-15 DIAGNOSIS — J441 Chronic obstructive pulmonary disease with (acute) exacerbation: Secondary | ICD-10-CM | POA: Diagnosis not present

## 2024-03-15 MED ORDER — IPRATROPIUM-ALBUTEROL 0.5-2.5 (3) MG/3ML IN SOLN
3.0000 mL | Freq: Once | RESPIRATORY_TRACT | Status: AC
  Administered 2024-03-15: 3 mL via RESPIRATORY_TRACT
  Filled 2024-03-15: qty 3

## 2024-03-15 MED ORDER — PREDNISONE 20 MG PO TABS
60.0000 mg | ORAL_TABLET | Freq: Once | ORAL | Status: AC
  Administered 2024-03-15: 60 mg via ORAL
  Filled 2024-03-15: qty 3

## 2024-03-15 MED ORDER — PENICILLIN V POTASSIUM 500 MG PO TABS
500.0000 mg | ORAL_TABLET | Freq: Three times a day (TID) | ORAL | 0 refills | Status: DC
Start: 2024-03-15 — End: 2024-03-28

## 2024-03-15 MED ORDER — OXYCODONE-ACETAMINOPHEN 5-325 MG PO TABS
2.0000 | ORAL_TABLET | Freq: Once | ORAL | Status: AC
  Administered 2024-03-15: 2 via ORAL
  Filled 2024-03-15: qty 2

## 2024-03-15 MED ORDER — PREDNISONE 20 MG PO TABS
ORAL_TABLET | ORAL | 0 refills | Status: DC
Start: 2024-03-15 — End: 2024-03-28

## 2024-03-15 MED ORDER — PENICILLIN V POTASSIUM 250 MG PO TABS
500.0000 mg | ORAL_TABLET | Freq: Once | ORAL | Status: AC
  Administered 2024-03-15: 500 mg via ORAL
  Filled 2024-03-15: qty 2

## 2024-03-15 NOTE — ED Provider Notes (Signed)
 [place] EMERGENCY DEPARTMENT AT [facility] Provider Note   CSN: [id number] Arrival date & time: 03/15/24  0120     Patient presents with: Shortness of Breath   Julie [name] is a 48 y.o. female.   The patient presents with difficulty breathing and a toothache radiating to the right ear. The patient reports a recent history of an ear infection and was previously prescribed a seven-day course of antibiotics, of which only three days were completed. The [facility] a [facility]. The toothache began last night and is described as radiating from the mouth to the ear. The patient denies any sensation of crawling in the ear. The patient has not seen a dentist and has a primary care appointment scheduled for the 29th. The patient has a prescription for albuterol  and used it prior to arrival and once more before triage. The patient requests a breathing treatment and reports wheezing. History was obtained from the patient.   Shortness of Breath      Prior to Admission medications   Medication Sig Start Date End Date Taking? Authorizing Provider  penicillin  v potassium (VEETID) 500 MG tablet Take 1 tablet (500 mg total) by mouth 3 (three) times daily. 03/15/24  Yes Pheobe Sandiford, Selinda, MD  predniSONE  (DELTASONE ) 20 MG tablet 2 tabs po daily x 4 days 03/15/24  Yes Eriona Kinchen, Selinda, MD  albuterol  (VENTOLIN  HFA) 108 (90 Base) MCG/ACT inhaler Inhale 1-2 puffs into the lungs every 6 (six) hours as needed for wheezing or shortness of breath. 02/11/24   Perri DELENA Meliton Mickey., MD  amLODipine  (NORVASC ) 5 MG tablet Take 1 tablet (5 mg total) by mouth daily. 03/06/24 04/05/24  Towana Leita SAILOR, MD  furosemide  (LASIX ) 20 MG tablet Take 1 tablet (20 mg total) by mouth daily. 03/06/24 04/05/24  Towana Leita SAILOR, MD  losartan  (COZAAR ) 50 MG tablet Take 1 tablet (50 mg total) by mouth daily. 03/06/24 04/05/24  Towana Leita SAILOR, MD  metFORMIN  (GLUCOPHAGE ) 1000 MG tablet Take 1 tablet  (1,000 mg total) by mouth 2 (two) times daily with a meal. 03/06/24   Towana Leita SAILOR, MD  nicotine  (NICODERM CQ  - DOSED IN MG/24 HOURS) 21 mg/24hr patch Place 1 patch (21 mg total) onto the skin daily at 6 (six) AM. 03/06/24 04/05/24  Towana Leita SAILOR, MD  risperiDONE  (RISPERDAL ) 0.5 MG tablet Take 1 tablet (0.5 mg total) by mouth 2 (two) times daily. 03/06/24   Towana Leita SAILOR, MD  sertraline  (ZOLOFT ) 50 MG tablet Take 1 tablet (50 mg total) by mouth daily. 03/06/24   Towana Leita SAILOR, MD  umeclidinium-vilanterol (ANORO ELLIPTA ) 62.5-25 MCG/ACT AEPB Inhale 1 puff into the lungs daily. 02/11/24   Perri DELENA Meliton Mickey., MD    Allergies: Patient has no known allergies.    Review of Systems  Respiratory:  Positive for shortness of breath.     Updated Vital Signs BP (!) 175/87 (BP Location: Right Arm)   Pulse 68   Temp 98.3 F (36.8 C)   Resp 20   Ht 5' 10 (1.778 m)   Wt 124.1 kg   SpO2 99%   BMI 39.26 kg/m   Physical Exam Vitals and nursing note reviewed.  Constitutional:      Appearance: She is well-developed.  HENT:     Head: Normocephalic and atraumatic.     Comments: Multiple dental caries. No drainable abscess. Mild gingival erythema.  Cardiovascular:     Rate and Rhythm: Normal rate and  regular rhythm.  Pulmonary:     Effort: No respiratory distress.     Breath sounds: No stridor.  Abdominal:     General: There is no distension.  Musculoskeletal:     Cervical back: Normal range of motion.  Neurological:     Mental Status: She is alert.     (all labs ordered are listed, but only abnormal results are displayed) Labs Reviewed - No data to display  EKG: None  Radiology: No results found.   Procedures   Medications Ordered in the ED  ipratropium-albuterol  (DUONEB) 0.5-2.5 (3) MG/3ML nebulizer solution 3 mL (3 mLs Nebulization Given 03/15/24 0304)  predniSONE  (DELTASONE ) tablet 60 mg (60 mg Oral Given 03/15/24 0302)  penicillin  v potassium (VEETID) tablet 500 mg  (500 mg Oral Given 03/15/24 0302)  oxyCODONE -acetaminophen  (PERCOCET/ROXICET) 5-325 MG per tablet 2 tablet (2 tablets Oral Given 03/15/24 0302)                                    Medical Decision Making Risk Prescription drug management.   Abx restarted. Tx for COPD given. VS WNL. No e/o complicated infection, abscess or deep neck space infection to require further workup or hospitalization. Normal voice.      Final diagnoses:  COPD exacerbation (HCC)  Pain, dental    ED Discharge Orders          Ordered    penicillin  v potassium (VEETID) 500 MG tablet  3 times daily        03/15/24 0241    predniSONE  (DELTASONE ) 20 MG tablet        03/15/24 0241               Corneluis Allston, Selinda, MD 03/15/24 [phone]

## 2024-03-15 NOTE — ED Triage Notes (Signed)
 Pt reports she is here for a breathing treatment, reports hx of COPD and asthma; pt further reports left foot swelling with a callus noted to left foot, pain 6/10

## 2024-03-15 NOTE — ED Notes (Signed)
 Pt stated she does not want any labs and she wants to sit in the waiting room with family.

## 2024-03-27 ENCOUNTER — Emergency Department (HOSPITAL_COMMUNITY)
Admission: EM | Admit: 2024-03-27 | Discharge: 2024-03-27 | Disposition: A | Discharge status: Home / Self Care | Payer: MEDICAID | Attending: Emergency Medicine | Admitting: Emergency Medicine

## 2024-03-27 ENCOUNTER — Emergency Department (HOSPITAL_COMMUNITY): Payer: MEDICAID

## 2024-03-27 ENCOUNTER — Emergency Department (HOSPITAL_COMMUNITY)
Admission: EM | Admit: 2024-03-27 | Discharge: 2024-03-28 | Disposition: A | Discharge status: Home / Self Care | Payer: MEDICAID | Source: Home / Self Care | Attending: Emergency Medicine | Admitting: Emergency Medicine

## 2024-03-27 ENCOUNTER — Other Ambulatory Visit: Payer: Self-pay

## 2024-03-27 ENCOUNTER — Encounter (HOSPITAL_COMMUNITY): Payer: Self-pay

## 2024-03-27 DIAGNOSIS — N3000 Acute cystitis without hematuria: Secondary | ICD-10-CM | POA: Insufficient documentation

## 2024-03-27 DIAGNOSIS — E119 Type 2 diabetes mellitus without complications: Secondary | ICD-10-CM | POA: Insufficient documentation

## 2024-03-27 DIAGNOSIS — J45909 Unspecified asthma, uncomplicated: Secondary | ICD-10-CM | POA: Diagnosis not present

## 2024-03-27 DIAGNOSIS — F17210 Nicotine dependence, cigarettes, uncomplicated: Secondary | ICD-10-CM | POA: Diagnosis not present

## 2024-03-27 DIAGNOSIS — M545 Low back pain, unspecified: Secondary | ICD-10-CM | POA: Diagnosis present

## 2024-03-27 DIAGNOSIS — R079 Chest pain, unspecified: Secondary | ICD-10-CM | POA: Diagnosis present

## 2024-03-27 LAB — TROPONIN I (HIGH SENSITIVITY)
Troponin I (High Sensitivity): 44 ng/L — ABNORMAL HIGH (ref <18)
Troponin I (High Sensitivity): 38 ng/L — ABNORMAL HIGH (ref <18)

## 2024-03-27 LAB — CBC
HCT: 39.9 % (ref 36.0–46.0)
Hemoglobin: 12.3 g/dL (ref 12.0–15.0)
MCH: 26.4 pg (ref 26.0–34.0)
MCHC: 30.8 g/dL (ref 30.0–36.0)
MCV: 85.6 fL (ref 80.0–100.0)
Platelets: 207 K/uL (ref 150–400)
RBC: 4.66 MIL/uL (ref 3.87–5.11)
RDW: 15.2 % (ref 11.5–15.5)
WBC: 6.7 K/uL (ref 4.0–10.5)
nRBC: 0 % (ref 0.0–0.2)

## 2024-03-27 LAB — URINALYSIS, ROUTINE W REFLEX MICROSCOPIC
Bilirubin Urine: NEGATIVE
Glucose, UA: 50 mg/dL — AB
Hgb urine dipstick: NEGATIVE
Ketones, ur: NEGATIVE mg/dL
Nitrite: POSITIVE — AB
Protein, ur: NEGATIVE mg/dL
Specific Gravity, Urine: 1.013 (ref 1.005–1.030)
WBC, UA: >50 WBC/hpf (ref 0–5)
pH: 5 (ref 5.0–8.0)

## 2024-03-27 LAB — BRAIN NATRIURETIC PEPTIDE: B Natriuretic Peptide: 350.6 pg/mL — ABNORMAL HIGH (ref 0.0–100.0)

## 2024-03-27 LAB — BASIC METABOLIC PANEL WITH GFR
Anion gap: 9 (ref 5–15)
BUN: 12 mg/dL (ref 6–20)
CO2: 24 mmol/L (ref 22–32)
Calcium: 10.5 mg/dL — ABNORMAL HIGH (ref 8.9–10.3)
Chloride: 109 mmol/L (ref 98–111)
Creatinine, Ser: 0.76 mg/dL (ref 0.44–1.00)
GFR, Estimated: >60 mL/min (ref >60)
Glucose, Bld: 219 mg/dL — ABNORMAL HIGH (ref 70–99)
Potassium: 3.9 mmol/L (ref 3.5–5.1)
Sodium: 142 mmol/L (ref 135–145)

## 2024-03-27 LAB — HCG, SERUM, QUALITATIVE: Preg, Serum: NEGATIVE

## 2024-03-27 MED ORDER — FUROSEMIDE 20 MG PO TABS
20.0000 mg | ORAL_TABLET | Freq: Once | ORAL | Status: AC
  Administered 2024-03-27: 20 mg via ORAL
  Filled 2024-03-27: qty 1

## 2024-03-27 MED ORDER — AMLODIPINE BESYLATE 5 MG PO TABS
5.0000 mg | ORAL_TABLET | Freq: Once | ORAL | Status: AC
  Administered 2024-03-27: 5 mg via ORAL
  Filled 2024-03-27: qty 1

## 2024-03-27 MED ORDER — HYDROCODONE-ACETAMINOPHEN 5-325 MG PO TABS
1.0000 | ORAL_TABLET | Freq: Once | ORAL | Status: AC
  Administered 2024-03-27: 1 via ORAL
  Filled 2024-03-27: qty 1

## 2024-03-27 NOTE — ED Notes (Signed)
Pt refused second troponin  
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
## 2024-03-27 NOTE — ED Triage Notes (Signed)
 Pt came in for complaints of lower back pain for the past 4 days and she isn't sure if its a UTI or not. Patients states she has had cold sweats and diarrhea but denies nausea and vomiting and fever. She also says she is having some swelling in the lower extremities.

## 2024-03-27 NOTE — Discharge Instructions (Signed)
 You were evaluated in the Emergency Department and after careful evaluation, we did not find any emergent condition requiring admission or further testing in the hospital.  Your exam/testing today is overall reassuring.  Continue taking your home medications and follow-up with your primary care doctor.  Please return to the Emergency Department if you experience any worsening of your condition.   Thank you for allowing us  to be a part of your care.
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
## 2024-03-28 ENCOUNTER — Other Ambulatory Visit (HOSPITAL_COMMUNITY): Payer: Self-pay

## 2024-03-28 MED ORDER — FUROSEMIDE 20 MG PO TABS: 20.0000 mg | ORAL_TABLET | Freq: Every day | ORAL | 0 refills | Status: DC

## 2024-03-28 MED ORDER — LOSARTAN POTASSIUM 50 MG PO TABS: 50.0000 mg | ORAL_TABLET | Freq: Every day | ORAL | 0 refills | Status: DC

## 2024-03-28 MED ORDER — CEPHALEXIN 250 MG PO CAPS
500.0000 mg | ORAL_CAPSULE | Freq: Once | ORAL | Status: AC
  Administered 2024-03-28: 500 mg via ORAL
  Filled 2024-03-28: qty 2

## 2024-03-28 MED ORDER — METFORMIN HCL 1000 MG PO TABS
1000.0000 mg | ORAL_TABLET | Freq: Two times a day (BID) | ORAL | 0 refills | Status: DC
  Filled 2024-03-28: qty 60, 30d supply, fill #0

## 2024-03-28 MED ORDER — CYCLOBENZAPRINE HCL 10 MG PO TABS
5.0000 mg | ORAL_TABLET | Freq: Once | ORAL | Status: AC
  Administered 2024-03-28: 5 mg via ORAL
  Filled 2024-03-28: qty 1

## 2024-03-28 MED ORDER — FUROSEMIDE 20 MG PO TABS
20.0000 mg | ORAL_TABLET | Freq: Every day | ORAL | 0 refills | Status: DC
  Filled 2024-03-28: qty 30, 30d supply, fill #0

## 2024-03-28 MED ORDER — CYCLOBENZAPRINE HCL 10 MG PO TABS: 10.0000 mg | ORAL_TABLET | Freq: Two times a day (BID) | ORAL | 0 refills | Status: DC | PRN

## 2024-03-28 MED ORDER — RISPERIDONE 0.5 MG PO TABS: 0.5000 mg | ORAL_TABLET | Freq: Two times a day (BID) | ORAL | 0 refills | Status: DC

## 2024-03-28 MED ORDER — AMLODIPINE BESYLATE 5 MG PO TABS
5.0000 mg | ORAL_TABLET | Freq: Every day | ORAL | 0 refills | Status: DC
  Filled 2024-03-28: qty 30, 30d supply, fill #0

## 2024-03-28 MED ORDER — SERTRALINE HCL 50 MG PO TABS: 50.0000 mg | ORAL_TABLET | Freq: Every day | ORAL | 0 refills | Status: DC

## 2024-03-28 MED ORDER — UMECLIDINIUM-VILANTEROL 62.5-25 MCG/ACT IN AEPB
1.0000 | INHALATION_SPRAY | Freq: Every day | RESPIRATORY_TRACT | 0 refills | Status: DC
  Filled 2024-03-28: qty 60, 30d supply, fill #0

## 2024-03-28 MED ORDER — LOSARTAN POTASSIUM 50 MG PO TABS
50.0000 mg | ORAL_TABLET | Freq: Every day | ORAL | 0 refills | Status: DC
  Filled 2024-03-28: qty 30, 30d supply, fill #0

## 2024-03-28 MED ORDER — NICOTINE 21 MG/24HR TD PT24: 21.0000 mg | MEDICATED_PATCH | Freq: Every day | TRANSDERMAL | 0 refills | Status: AC

## 2024-03-28 MED ORDER — AMLODIPINE BESYLATE 5 MG PO TABS: 5.0000 mg | ORAL_TABLET | Freq: Every day | ORAL | 0 refills | Status: DC

## 2024-03-28 MED ORDER — SERTRALINE HCL 50 MG PO TABS
50.0000 mg | ORAL_TABLET | Freq: Every day | ORAL | 0 refills | Status: DC
  Filled 2024-03-28: qty 30, 30d supply, fill #0

## 2024-03-28 MED ORDER — CYCLOBENZAPRINE HCL 10 MG PO TABS
10.0000 mg | ORAL_TABLET | Freq: Two times a day (BID) | ORAL | 0 refills | Status: DC | PRN
  Filled 2024-03-28: qty 20, 10d supply, fill #0

## 2024-03-28 MED ORDER — RISPERIDONE 0.5 MG PO TABS
0.5000 mg | ORAL_TABLET | Freq: Two times a day (BID) | ORAL | 0 refills | Status: DC
  Filled 2024-03-28: qty 60, 30d supply, fill #0

## 2024-03-28 MED ORDER — ALBUTEROL SULFATE HFA 108 (90 BASE) MCG/ACT IN AERS: 1.0000 | INHALATION_SPRAY | Freq: Four times a day (QID) | RESPIRATORY_TRACT | 0 refills | Status: DC | PRN

## 2024-03-28 MED ORDER — ALBUTEROL SULFATE HFA 108 (90 BASE) MCG/ACT IN AERS
1.0000 | INHALATION_SPRAY | Freq: Four times a day (QID) | RESPIRATORY_TRACT | 0 refills | Status: DC | PRN
  Filled 2024-03-28: qty 36, 50d supply, fill #0

## 2024-03-28 MED ORDER — UMECLIDINIUM-VILANTEROL 62.5-25 MCG/ACT IN AEPB: 1.0000 | INHALATION_SPRAY | Freq: Every day | RESPIRATORY_TRACT | 0 refills | Status: DC

## 2024-03-28 MED ORDER — METFORMIN HCL 1000 MG PO TABS: 1000.0000 mg | ORAL_TABLET | Freq: Two times a day (BID) | ORAL | 0 refills | Status: DC

## 2024-03-28 MED ORDER — CEPHALEXIN 500 MG PO CAPS
500.0000 mg | ORAL_CAPSULE | Freq: Three times a day (TID) | ORAL | 0 refills | Status: AC
  Filled 2024-03-28: qty 21, 7d supply, fill #0

## 2024-03-28 MED ORDER — CEPHALEXIN 500 MG PO CAPS
500.0000 mg | ORAL_CAPSULE | Freq: Three times a day (TID) | ORAL | 0 refills | Status: DC
Start: 2024-03-28 — End: 2024-03-28

## 2024-03-28 MED ORDER — AMLODIPINE BESYLATE 5 MG PO TABS
5.0000 mg | ORAL_TABLET | Freq: Once | ORAL | Status: AC
  Administered 2024-03-28: 5 mg via ORAL
  Filled 2024-03-28: qty 1

## 2024-03-28 NOTE — TOC CM/SW Note (Signed)
 TOC referral for med assistance and transportation home. Per pharmacy pt's ins coverage is [employer] [facility] [employer] and pt's out-of-pocket expense is $4.00 per prescription. This information was relayed to pt and she agreed to have prescriptions filled here.   Pt received two bus passes at discharge, one for her and her companion.

## 2024-03-28 NOTE — Discharge Instructions (Addendum)
 You were evaluated in the Emergency Department and after careful evaluation, we did not find any emergent condition requiring admission or further testing in the hospital.  Your exam/testing today is overall reassuring.  Take the Keflex  antibiotic to treat for UTI.  Can use the Flexeril  as well to see if this helps her back pain.  Continue your home medications.  Please return to the Emergency Department if you experience any worsening of your condition.   Thank you for allowing us  to be a part of your care.

## 2024-03-28 NOTE — ED Notes (Signed)
 PT  stepped out for a bit.  She is waiting on SW to bring her medications.

## 2024-03-28 NOTE — ED Provider Notes (Addendum)
 [facility] Emergency Department Provider Note MRN:  [id number]  Arrival date & time: 03/28/24     Chief Complaint   Back pain History of Present Illness   Julie [name] is a 48 y.o. year-old female with a history of diabetes presenting to the ED with chief complaint of back pain.  Right lower back pain for the past day or 2.  Worse with certain positions.  Wondering if it is a muscle or UTI.  Also having some bilateral leg swelling and discomfort, has been told she has neuropathy, also is supposed to be taking her Lasix .  Denies numbness or weakness to the arms or legs, no bowel or bladder dysfunction.  Review of Systems  A thorough review of systems was obtained and all systems are negative except as noted in the HPI and PMH.   Patient's Health History    Past Medical History:  Diagnosis Date   Asthma    Diabetes mellitus without complication (HCC)    Fracture, humerus closed, shaft 07/11/2014   left   History of bronchitis    Mental health problem    Open fracture of great toe of left foot 12/25/2015    Past Surgical History:  Procedure Laterality Date   CESAREAN SECTION     ECTOPIC PREGNANCY SURGERY  2003   INCISION AND DRAINAGE Left 12/25/2015   Procedure: INCISION AND DRAINAGE With  Amputation of distal tip of Left Great and Second Toe.;  Surgeon: Kay CHRISTELLA Cummins, MD;  Location: [facility];  Service: Orthopedics;  Laterality: Left;   LIPOMA EXCISION     chest   ORIF HUMERUS FRACTURE Left 07/15/2014   Procedure: OPEN REDUCTION INTERNAL FIXATION (ORIF) LEFT HUMERAL SHAFT ;  Surgeon: Evalene JONETTA Chancy, MD;  Location: [facility];  Service: Orthopedics;  Laterality: Left;   TUBAL LIGATION  2003    History reviewed. No pertinent family history.  Social History   Socioeconomic History   Marital status: Single    Spouse name: Not on file   Number of children: Not on file   Years of education: Not on file   Highest education level: Not on  file  Occupational History   Not on file  Tobacco Use   Smoking status: Every Day    Current packs/day: 0.50    Average packs/day: 0.5 packs/day for 18.0 years (9.0 ttl pk-yrs)    Types: Cigarettes   Smokeless tobacco: Never   Tobacco comments:    6 cig./day  Vaping Use   Vaping status: Never Used  Substance and Sexual Activity   Alcohol  use: Yes    Comment: occasionally   Drug use: Yes    Types: Marijuana, Crack cocaine, MDMA (Ecstacy)    Comment: Crack, THC, Molly   Sexual activity: Yes    Birth control/protection: I.U.D.  Other Topics Concern   Not on file  Social History Narrative   Not on file   Social Drivers of Health   Financial Resource Strain: Not on file  Food Insecurity: Food Insecurity Present (03/02/2024)   Hunger Vital Sign    Worried About Running Out of Food in the Last Year: Often true    Ran Out of Food in the Last Year: Often true  Transportation Needs: Unmet Transportation Needs (03/02/2024)   PRAPARE - Administrator, Civil Service (Medical): Yes    Lack of Transportation (Non-Medical): Yes  Physical Activity: Not on file  Stress: Not on file  Social Connections: Not  on file  Intimate Partner Violence: Not At Risk (03/02/2024)   Humiliation, Afraid, Rape, and Kick questionnaire    Fear of Current or Ex-Partner: No    Emotionally Abused: No    Physically Abused: No    Sexually Abused: No     Physical Exam   Vitals:   03/28/24 0135 03/28/24 0309  BP: (!) 202/115 (!) 189/115  Pulse: (!) 107 (!) 108  Resp: 16 15  Temp: 98 F (36.7 C) 98 F (36.7 C)  SpO2: 93% 92%    CONSTITUTIONAL: Well-appearing, NAD NEURO/PSYCH:  Alert and oriented x 3, no focal deficits EYES:  eyes equal and reactive ENT/NECK:  no LAD, no JVD CARDIO: Regular rate, well-perfused, normal S1 and S2 PULM:  CTAB no wheezing or rhonchi GI/GU:  non-distended, non-tender MSK/SPINE:  No gross deformities, no edema SKIN:  no rash, atraumatic   *Additional  and/or pertinent findings included in MDM below  Diagnostic and Interventional Summary    EKG Interpretation Date/Time:    Ventricular Rate:    PR Interval:    QRS Duration:    QT Interval:    QTC Calculation:   R Axis:      Text Interpretation:         Labs Reviewed  URINALYSIS, ROUTINE W REFLEX MICROSCOPIC - Abnormal; Notable for the following components:      Result Value   APPearance HAZY (*)    Glucose, UA 50 (*)    Nitrite POSITIVE (*)    Leukocytes,Ua LARGE (*)    Bacteria, UA MANY (*)    All other components within normal limits    No orders to display    Medications  cephALEXin  (KEFLEX ) capsule 500 mg (500 mg Oral Given 03/28/24 0519)  cyclobenzaprine  (FLEXERIL ) tablet 5 mg (5 mg Oral Given 03/28/24 0519)  amLODipine  (NORVASC ) tablet 5 mg (5 mg Oral Given 03/28/24 0519)     Procedures  /  Critical Care Procedures  ED Course and Medical Decision Making  Initial Impression and Ddx Suspect MSK back pain versus less likely UTI.  No fever, no red flag symptoms to suggest myelopathy.  Past medical/surgical history that increases complexity of ED encounter: Diabetes  Interpretation of Diagnostics I personally reviewed the Laboratory Testing and my interpretation is as follows: Urinalysis consistent with UTI    Patient Reassessment and Ultimate Disposition/Management     Will treat for urinary tract infection however the description is more muscular.  Patient is appropriate for discharge.  Patient unable to afford any of her medications, would like to wait for case management or social work to help with this, has received help with medications in the past.  She can be brought to triage to talk with his case management upon their arrival, placing her has a pending discharge, medications prescribed.  Patient management required discussion with the following services or consulting groups:  None  Complexity of Problems Addressed Acute complicated illness or  Injury  Additional Data Reviewed and Analyzed Further history obtained from: Further history from spouse/family member  Additional Factors Impacting ED Encounter Risk Prescriptions  Ozell HERO. Theadore, MD [facility] Emergency Medicine [facility] [email or web address]  Final Clinical Impressions(s) / ED Diagnoses     ICD-10-CM   1. Acute right-sided low back pain without sciatica  M54.50     2. Acute cystitis without hematuria  N30.00       ED Discharge Orders          Ordered  cephALEXin  (KEFLEX ) 500 MG capsule  3 times daily        03/28/24 0520    cyclobenzaprine  (FLEXERIL ) 10 MG tablet  2 times daily PRN        03/28/24 0520    albuterol  (VENTOLIN  HFA) 108 (90 Base) MCG/ACT inhaler  Every 6 hours PRN        03/28/24 0539    amLODipine  (NORVASC ) 5 MG tablet  Daily        03/28/24 0539    furosemide  (LASIX ) 20 MG tablet  Daily        03/28/24 0539    losartan  (COZAAR ) 50 MG tablet  Daily        03/28/24 0539    metFORMIN  (GLUCOPHAGE ) 1000 MG tablet  2 times daily with meals        03/28/24 0539    risperiDONE  (RISPERDAL ) 0.5 MG tablet  2 times daily        03/28/24 0539    umeclidinium-vilanterol (ANORO ELLIPTA ) 62.5-25 MCG/ACT AEPB  Daily        03/28/24 0539    sertraline  (ZOLOFT ) 50 MG tablet  Daily        03/28/24 0539    nicotine  (NICODERM CQ  - DOSED IN MG/24 HOURS) 21 mg/24hr patch  Daily        03/28/24 0539             Discharge Instructions Discussed with and Provided to Patient:     Discharge Instructions      You were evaluated in the Emergency Department and after careful evaluation, we did not find any emergent condition requiring admission or further testing in the hospital.  Your exam/testing today is overall reassuring.  Take the Keflex  antibiotic to treat for UTI.  Can use the Flexeril  as well to see if this helps her back pain.  Continue your home medications.  Please return to the Emergency Department if you  experience any worsening of your condition.   Thank you for allowing us  to be a part of your care.       Theadore Ozell HERO, MD 03/28/24 [id number]    Theadore Ozell HERO, MD 03/28/24 [id number]    Theadore Ozell HERO, MD 03/28/24 [phone]

## 2024-04-17 ENCOUNTER — Other Ambulatory Visit: Payer: Self-pay

## 2024-04-17 ENCOUNTER — Emergency Department (HOSPITAL_COMMUNITY): Payer: MEDICAID

## 2024-04-17 ENCOUNTER — Emergency Department (HOSPITAL_COMMUNITY)
Admission: EM | Admit: 2024-04-17 | Discharge: 2024-04-17 | Disposition: A | Discharge status: Home / Self Care | Payer: MEDICAID | Attending: Emergency Medicine | Admitting: Emergency Medicine

## 2024-04-17 DIAGNOSIS — I110 Hypertensive heart disease with heart failure: Secondary | ICD-10-CM | POA: Diagnosis not present

## 2024-04-17 DIAGNOSIS — E119 Type 2 diabetes mellitus without complications: Secondary | ICD-10-CM | POA: Diagnosis not present

## 2024-04-17 DIAGNOSIS — Z79899 Other long term (current) drug therapy: Secondary | ICD-10-CM | POA: Diagnosis not present

## 2024-04-17 DIAGNOSIS — N3000 Acute cystitis without hematuria: Secondary | ICD-10-CM | POA: Insufficient documentation

## 2024-04-17 DIAGNOSIS — R0602 Shortness of breath: Secondary | ICD-10-CM | POA: Diagnosis present

## 2024-04-17 DIAGNOSIS — Z7984 Long term (current) use of oral hypoglycemic drugs: Secondary | ICD-10-CM | POA: Diagnosis not present

## 2024-04-17 DIAGNOSIS — I509 Heart failure, unspecified: Secondary | ICD-10-CM | POA: Insufficient documentation

## 2024-04-17 DIAGNOSIS — J45909 Unspecified asthma, uncomplicated: Secondary | ICD-10-CM | POA: Insufficient documentation

## 2024-04-17 LAB — BASIC METABOLIC PANEL WITH GFR
Anion gap: 10 (ref 5–15)
BUN: 9 mg/dL (ref 6–20)
CO2: 23 mmol/L (ref 22–32)
Calcium: 9.8 mg/dL (ref 8.9–10.3)
Chloride: 106 mmol/L (ref 98–111)
Creatinine, Ser: 0.8 mg/dL (ref 0.44–1.00)
GFR, Estimated: >60 mL/min (ref >60)
Glucose, Bld: 244 mg/dL — ABNORMAL HIGH (ref 70–99)
Potassium: 3.9 mmol/L (ref 3.5–5.1)
Sodium: 139 mmol/L (ref 135–145)

## 2024-04-17 LAB — HEPATIC FUNCTION PANEL
ALT: 18 U/L (ref 0–44)
AST: 22 U/L (ref 15–41)
Albumin: 3.7 g/dL (ref 3.5–5.0)
Alkaline Phosphatase: 54 U/L (ref 38–126)
Bilirubin, Direct: 0.2 mg/dL (ref 0.0–0.2)
Indirect Bilirubin: 0.1 mg/dL — ABNORMAL LOW (ref 0.3–0.9)
Total Bilirubin: 0.3 mg/dL (ref 0.0–1.2)
Total Protein: 6.9 g/dL (ref 6.5–8.1)

## 2024-04-17 LAB — URINALYSIS, ROUTINE W REFLEX MICROSCOPIC
Bilirubin Urine: NEGATIVE
Glucose, UA: NEGATIVE mg/dL
Hgb urine dipstick: NEGATIVE
Ketones, ur: NEGATIVE mg/dL
Nitrite: POSITIVE — AB
Protein, ur: NEGATIVE mg/dL
Specific Gravity, Urine: 1.017 (ref 1.005–1.030)
pH: 6 (ref 5.0–8.0)

## 2024-04-17 LAB — CBC
HCT: 41.3 % (ref 36.0–46.0)
Hemoglobin: 12.8 g/dL (ref 12.0–15.0)
MCH: 26.5 pg (ref 26.0–34.0)
MCHC: 31 g/dL (ref 30.0–36.0)
MCV: 85.5 fL (ref 80.0–100.0)
Platelets: 222 K/uL (ref 150–400)
RBC: 4.83 MIL/uL (ref 3.87–5.11)
RDW: 15.3 % (ref 11.5–15.5)
WBC: 6.2 K/uL (ref 4.0–10.5)
nRBC: 0 % (ref 0.0–0.2)

## 2024-04-17 LAB — BRAIN NATRIURETIC PEPTIDE: B Natriuretic Peptide: 324.5 pg/mL — ABNORMAL HIGH (ref 0.0–100.0)

## 2024-04-17 LAB — HCG, SERUM, QUALITATIVE: Preg, Serum: NEGATIVE

## 2024-04-17 MED ORDER — ALBUTEROL SULFATE (2.5 MG/3ML) 0.083% IN NEBU
5.0000 mg | INHALATION_SOLUTION | Freq: Once | RESPIRATORY_TRACT | Status: DC
  Filled 2024-04-17: qty 6

## 2024-04-17 MED ORDER — LOSARTAN POTASSIUM 50 MG PO TABS
50.0000 mg | ORAL_TABLET | Freq: Once | ORAL | Status: AC
  Administered 2024-04-17: 50 mg via ORAL
  Filled 2024-04-17: qty 1

## 2024-04-17 MED ORDER — AMLODIPINE BESYLATE 5 MG PO TABS
5.0000 mg | ORAL_TABLET | Freq: Once | ORAL | Status: AC
  Administered 2024-04-17: 5 mg via ORAL
  Filled 2024-04-17: qty 1

## 2024-04-17 MED ORDER — FUROSEMIDE 10 MG/ML IJ SOLN
40.0000 mg | Freq: Once | INTRAMUSCULAR | Status: AC
  Administered 2024-04-17: 40 mg via INTRAVENOUS
  Filled 2024-04-17: qty 4

## 2024-04-17 MED ORDER — IPRATROPIUM BROMIDE 0.02 % IN SOLN
0.5000 mg | Freq: Once | RESPIRATORY_TRACT | Status: DC
  Filled 2024-04-17: qty 2.5

## 2024-04-17 MED ORDER — CEFADROXIL 500 MG PO CAPS: 500.0000 mg | ORAL_CAPSULE | Freq: Two times a day (BID) | ORAL | 0 refills | Status: DC

## 2024-04-17 MED ORDER — IOHEXOL 350 MG/ML SOLN
75.0000 mL | Freq: Once | INTRAVENOUS | Status: AC | PRN
  Administered 2024-04-17: 75 mL via INTRAVENOUS

## 2024-04-17 MED ORDER — HYDRALAZINE HCL 20 MG/ML IJ SOLN
5.0000 mg | Freq: Once | INTRAMUSCULAR | Status: AC
  Administered 2024-04-17: 5 mg via INTRAVENOUS
  Filled 2024-04-17: qty 1

## 2024-04-17 NOTE — ED Notes (Signed)
 Pt unable to provide UA at this time. IV team consult placed, poor venous access. Attempted twice before consult.

## 2024-04-17 NOTE — ED Notes (Signed)
 Pt at CT

## 2024-04-17 NOTE — ED Triage Notes (Signed)
 Pt c/o SHOB for a few days, hx of CHF

## 2024-04-17 NOTE — Discharge Instructions (Addendum)
 I would take 2 of your home Lasix  tablets daily instead of just 1 for the next 5 days, and take the entire course of antibiotics that prescribed to treat your urinary tract infection.  Please follow-up closely with your primary care doctor/cardiologist, or return to the emergency department if your symptoms worsen despite treatment.

## 2024-04-17 NOTE — ED Notes (Signed)
 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 Pt adv. She is ready to go

## 2024-04-17 NOTE — ED Provider Notes (Signed)
 [place] EMERGENCY DEPARTMENT AT [facility] Provider Note   CSN: [id number] Arrival date & time: 04/17/24  9291     Patient presents with: Shortness of Breath   Julie [name] is a 48 y.o. female with past medical history significant for asthma, cocaine use, CHF, anxiety, diabetes, hypertension who presents concern for shortness of breath for the last 3 days.  Worse when laying down.  She reports this feels like a CHF exacerbation.  She denies any chest pain.  Denies any recent drug use.  She also endorses some vaginal pain, concerned about a boil.  She rates the pain 7/10.  Additionally she reports that she has been having some abdominal pain for the last few days.  She rates it around 7/10.  She endorses previous ectopic pregnancy surgery, tubal ligation.  Still has her gallbladder and appendix reportedly.    Shortness of Breath      Prior to Admission medications   Medication Sig Start Date End Date Taking? Authorizing Provider  albuterol  (VENTOLIN  HFA) 108 (90 Base) MCG/ACT inhaler Inhale 1-2 puffs into the lungs every 6 (six) hours as needed for wheezing or shortness of breath. 03/28/24  Yes Beverley Leita LABOR, PA-C  amLODipine  (NORVASC ) 5 MG tablet Take 1 tablet (5 mg total) by mouth daily. 03/28/24 04/27/24 Yes Beverley Leita LABOR, PA-C  cefadroxil (DURICEF) 500 MG capsule Take 1 capsule (500 mg total) by mouth 2 (two) times daily. 04/17/24  Yes Lativia Velie H, PA-C  cyclobenzaprine  (FLEXERIL ) 10 MG tablet Take 1 tablet (10 mg total) by mouth 2 (two) times daily as needed for muscle spasms. 03/28/24  Yes Beverley Leita LABOR, PA-C  furosemide  (LASIX ) 20 MG tablet Take 1 tablet (20 mg total) by mouth daily. 03/28/24 04/27/24 Yes Beverley Leita LABOR, PA-C  losartan  (COZAAR ) 50 MG tablet Take 1 tablet (50 mg total) by mouth daily. 03/28/24 04/27/24 Yes Beverley Leita LABOR, PA-C  metFORMIN  (GLUCOPHAGE ) 1000 MG tablet Take 1 tablet (1,000 mg total) by mouth 2 (two) times daily with a meal.  03/28/24  Yes Beverley Leita LABOR, PA-C  risperiDONE  (RISPERDAL ) 0.5 MG tablet Take 1 tablet (0.5 mg total) by mouth 2 (two) times daily. 03/28/24  Yes Beverley Leita LABOR, PA-C  sertraline  (ZOLOFT ) 50 MG tablet Take 1 tablet (50 mg total) by mouth daily. 03/28/24  Yes Beverley Leita LABOR, PA-C  umeclidinium-vilanterol (ANORO ELLIPTA ) 62.5-25 MCG/ACT AEPB Inhale 1 puff into the lungs daily. 03/28/24  Yes Beverley Leita LABOR, PA-C  nicotine  (NICODERM CQ  - DOSED IN MG/24 HOURS) 21 mg/24hr patch Place 1 patch (21 mg total) onto the skin daily at 6 (six) AM. Patient not taking: Reported on 04/17/2024 03/28/24 04/27/24  Theadore Ozell HERO, MD    Allergies: Patient has no known allergies.    Review of Systems  Respiratory:  Positive for shortness of breath.   All other systems reviewed and are negative.   Updated Vital Signs BP (!) 166/113 (BP Location: Right Wrist)   Pulse (!) 102   Temp 98.2 F (36.8 C) (Oral)   Resp 20   Ht 5' 10 (1.778 m)   Wt 126.1 kg   SpO2 98%   BMI 39.89 kg/m   Physical Exam Vitals and nursing note reviewed.  Constitutional:      General: She is not in acute distress.    Appearance: Normal appearance.  HENT:     Head: Normocephalic and atraumatic.  Eyes:     General:  Right eye: No discharge.        Left eye: No discharge.  Cardiovascular:     Rate and Rhythm: Regular rhythm. Tachycardia present.     Heart sounds: No murmur heard.    No friction rub. No gallop.  Pulmonary:     Effort: Pulmonary effort is normal.     Breath sounds: Normal breath sounds.     Comments: Some rales, coarse rhonchi, crackles at lung bases.  Mildly tachypneic respiratory rate.  Does not seem to be in acute respiratory distress. Abdominal:     General: Bowel sounds are normal.     Palpations: Abdomen is soft.     Comments: Diffuse tenderness throughout the abdomen, most focally in the right upper quadrant.  No rebound, rigidity, guarding.  Genitourinary:    Comments: At area where patient  was concerned about possible abscess I do not see evidence of cellulitis or abscess today.  There is possibly a very small focus of developing ingrown hair, but overall inner thigh bilaterally as well as vulva bilaterally without any evidence of infection or abscess. Skin:    General: Skin is warm and dry.     Capillary Refill: Capillary refill takes less than 2 seconds.  Neurological:     Mental Status: She is alert and oriented to person, place, and time.  Psychiatric:        Mood and Affect: Mood normal.        Behavior: Behavior normal.     (all labs ordered are listed, but only abnormal results are displayed) Labs Reviewed  BASIC METABOLIC PANEL WITH GFR - Abnormal; Notable for the following components:      Result Value   Glucose, Bld 244 (*)    All other components within normal limits  URINALYSIS, ROUTINE W REFLEX MICROSCOPIC - Abnormal; Notable for the following components:   APPearance HAZY (*)    Nitrite POSITIVE (*)    Leukocytes,Ua LARGE (*)    Bacteria, UA MANY (*)    All other components within normal limits  BRAIN NATRIURETIC PEPTIDE - Abnormal; Notable for the following components:   B Natriuretic Peptide 324.5 (*)    All other components within normal limits  HEPATIC FUNCTION PANEL - Abnormal; Notable for the following components:   Indirect Bilirubin 0.1 (*)    All other components within normal limits  CBC  HCG, SERUM, QUALITATIVE  RAPID URINE DRUG SCREEN, HOSP PERFORMED    EKG: None  Radiology: CT ABDOMEN PELVIS W CONTRAST Result Date: 04/17/2024 CLINICAL DATA:  Abdominal pain. EXAM: CT ABDOMEN AND PELVIS WITH CONTRAST TECHNIQUE: Multidetector CT imaging of the abdomen and pelvis was performed using the standard protocol following bolus administration of intravenous contrast. RADIATION DOSE REDUCTION: This exam was performed according to the departmental dose-optimization program which includes automated exposure control, adjustment of the mA and/or kV  according to patient size and/or use of iterative reconstruction technique. CONTRAST:  75mL OMNIPAQUE  IOHEXOL  350 MG/ML SOLN COMPARISON:  02/17/2023 FINDINGS: Lower chest: Mild cardiomegaly. Elevation of the right hemidiaphragm. Minimal bibasilar dependent atelectasis. No effusion. Hepatobiliary: Gallbladder is contracted. Liver and biliary tree are normal. Pancreas: Normal. Spleen: Normal. Adrenals/Urinary Tract: Stable 2 cm nodule over the lateral limb of the left adrenal gland likely an adenoma and unchanged. 1.5 cm nodule over the medial limb of the left adrenal gland with Hounsfield unit measurements less than 10 compatible with an adenoma and unchanged. Stable 1.9 cm low-density right adrenal nodule likely an adenoma. Kidneys are normal size  without hydronephrosis or nephrolithiasis. 3.2 cm cyst over the upper pole left kidney unchanged. Ureters and bladder are normal. Stomach/Bowel: Stomach and small bowel are normal. Appendix is normal. Colon demonstrates diverticulosis without active inflammation. Vascular/Lymphatic: Abdominal aorta is normal in caliber. Remaining vascular structures are unremarkable. No adenopathy. Reproductive: 2.5 cm fibroid over the left side of the uterus unchanged. Right ovary is normal. Left ovary not well visualized. Other: No free fluid or focal inflammatory change. Musculoskeletal: No focal abnormality. IMPRESSION: 1. No acute findings in the abdomen/pelvis. 2. Stable bilateral adrenal adenomas. 3. Colonic diverticulosis.  No active inflammation. 4. 3.2 cm left renal cyst unchanged. No follow-up imaging is recommended. 5. 2.5 cm uterine fibroid unchanged. Electronically Signed   By: Toribio Agreste M.D.   On: 04/17/2024 10:02   DG Chest 2 View Result Date: 04/17/2024 EXAM: 2 VIEW(S) XRAY OF THE CHEST 04/17/2024 07:39:00 AM COMPARISON: 03/27/2024 CLINICAL HISTORY: Pt has had SOB for 3 days. SOB worse when laying down. FINDINGS: LUNGS AND PLEURA: Low lung volumes. Diffuse  interstitial prominence suspicious for interstitial edema. Atelectasis or infiltrate in right lung base. No pleural effusion. No pneumothorax. HEART AND MEDIASTINUM: No acute abnormality of the cardiac and mediastinal silhouettes. BONES AND SOFT TISSUES: Humerus fixation hardware noted. No acute osseous abnormality. IMPRESSION: 1. Low lung volumes. 2. Atelectasis or infiltrate in the right lung base. 3. Diffuse interstitial prominence suspicious for interstitial edema. Electronically signed by: Norleen Kil MD 04/17/2024 07:46 AM EDT RP Workstation: [id number]     Procedures   Medications Ordered in the ED  furosemide  (LASIX ) injection 40 mg (40 mg Intravenous Given 04/17/24 0934)  losartan  (COZAAR ) tablet 50 mg (50 mg Oral Given 04/17/24 0844)  amLODipine  (NORVASC ) tablet 5 mg (5 mg Oral Given 04/17/24 0844)  iohexol  (OMNIPAQUE ) 350 MG/ML injection 75 mL (75 mLs Intravenous Contrast Given 04/17/24 0936)                                    Medical Decision Making Amount and/or Complexity of Data Reviewed Labs: ordered. Radiology: ordered.   This patient is a 48 y.o. female  who presents to the ED for concern of shob, abdominal pain, possible abscess.   Differential diagnoses prior to evaluation: The emergent differential diagnosis includes, but is not limited to,  asthma exacerbation, COPD exacerbation, acute upper respiratory infection, acute bronchitis, chronic bronchitis, interstitial lung disease, ARDS, PE, pneumonia, atypical ACS, carbon monoxide poisoning, spontaneous pneumothorax, new CHF vs CHF exacerbation, versus other, The causes of generalized abdominal pain include but are not limited to AAA, mesenteric ischemia, appendicitis, diverticulitis, DKA, gastritis, gastroenteritis, AMI, nephrolithiasis, pancreatitis, peritonitis, adrenal insufficiency,lead poisoning, iron toxicity, intestinal ischemia, constipation, UTI,SBO/LBO, splenic rupture, biliary disease, IBD, IBS, PUD, or  hepatitis --given patient history considered ingrown hair, abscess, hidradenitis suppurativa, cellulitis, versus other.. This is not an exhaustive differential.   Past Medical History / Co-morbidities / Social History: asthma, cocaine use, CHF, anxiety, diabetes, hypertension   Additional history: Chart reviewed. Pertinent results include: Reviewed lab work, imaging from previous emergency department visits.  Physical Exam: Physical exam performed. The pertinent findings include: Some rales, coarse rhonchi, crackles at lung bases.  Mildly tachypneic respiratory rate.  Does not seem to be in acute respiratory distress.  : At area where patient was concerned about possible abscess, on left upper thigh, labia, I do not see evidence of cellulitis or abscess today.  There is possibly a very  small focus of developing ingrown hair, but overall inner thigh bilaterally as well as vulva bilaterally without any evidence of infection or abscess.  Initially tachycardic, pulse 105, on recheck  Lab Tests/Imaging studies: I personally interpreted labs/imaging and the pertinent results include: CBC unremarkable, negative serum pregnancy test, BMP notable for hyperglycemia, glucose 244.  Hepatic function panel unremarkable.  Plain film chest x-ray with diffuse interstitial prominence suspicious for interstitial edema.  CT abdomen pelvis with no acute intra-abdominal abnormality, findings stable compared to previous, uterine fibroids and renal cyst appear stable.  Her UA is notable for nitrate positive, large leukocytes, and many bacteria, consistent with acute urinary tract infection.  I agree with the radiologist interpretation.  Cardiac monitoring: EKG obtained and interpreted by myself and attending physician which shows: Normal sinus rhythm, prolonged QT   Medications: I ordered medication including administered Lasix  for suspected heart failure exacerbation, gave her home blood pressure medications for  hypertension in the emergency department..  I have reviewed the patients home medicines and have made adjustments as needed.   Disposition: After consideration of the diagnostic results and the patients response to treatment, I feel that patient with UTI, mild heart failure exacerbation, she has stable oxygen saturation on room air, her respiratory effort has improved on reassessment after IV diuresis, and she is feeling improved, I think that she is stable to increase her home Lasix  over the next week, and follow-up closely with her primary care doctor and cardiologist, and we will treat her urinary tract infection with antibiotics.   emergency department workup does not suggest an emergent condition requiring admission or immediate intervention beyond what has been performed at this time. The plan is: as above. The patient is safe for discharge and has been instructed to return immediately for worsening symptoms, change in symptoms or any other concerns.   Final diagnoses:  Acute on chronic congestive heart failure, unspecified heart failure type (HCC)  Acute cystitis without hematuria    ED Discharge Orders          Ordered    cefadroxil (DURICEF) 500 MG capsule  2 times daily        04/17/24 1059               Shabrea Weldin, [place], PA-C 04/17/24 1059    Bernard Drivers, MD 04/18/24 1506

## 2024-04-17 NOTE — ED Notes (Signed)
 Patient transported to X-ray

## 2024-04-17 NOTE — ED Triage Notes (Signed)
 Pt has had SOB for 3 days. SOB worse when laying down. Pt feels like CHF exacerbation. No CP

## 2024-04-29 ENCOUNTER — Emergency Department (HOSPITAL_COMMUNITY)
Admission: EM | Admit: 2024-04-29 | Discharge: 2024-04-30 | Disposition: A | Discharge status: Home / Self Care | Payer: MEDICAID | Attending: Emergency Medicine | Admitting: Emergency Medicine

## 2024-04-29 ENCOUNTER — Other Ambulatory Visit: Payer: Self-pay

## 2024-04-29 ENCOUNTER — Encounter (HOSPITAL_COMMUNITY): Payer: Self-pay

## 2024-04-29 ENCOUNTER — Emergency Department (HOSPITAL_COMMUNITY): Payer: MEDICAID

## 2024-04-29 DIAGNOSIS — E1165 Type 2 diabetes mellitus with hyperglycemia: Secondary | ICD-10-CM | POA: Insufficient documentation

## 2024-04-29 DIAGNOSIS — R739 Hyperglycemia, unspecified: Secondary | ICD-10-CM

## 2024-04-29 DIAGNOSIS — J4541 Moderate persistent asthma with (acute) exacerbation: Secondary | ICD-10-CM | POA: Diagnosis not present

## 2024-04-29 DIAGNOSIS — Z79899 Other long term (current) drug therapy: Secondary | ICD-10-CM | POA: Insufficient documentation

## 2024-04-29 DIAGNOSIS — Z7984 Long term (current) use of oral hypoglycemic drugs: Secondary | ICD-10-CM | POA: Insufficient documentation

## 2024-04-29 DIAGNOSIS — R059 Cough, unspecified: Secondary | ICD-10-CM | POA: Diagnosis present

## 2024-04-29 DIAGNOSIS — R0602 Shortness of breath: Secondary | ICD-10-CM | POA: Insufficient documentation

## 2024-04-29 DIAGNOSIS — I10 Essential (primary) hypertension: Secondary | ICD-10-CM | POA: Diagnosis not present

## 2024-04-29 LAB — CBC
HCT: 38.7 % (ref 36.0–46.0)
Hemoglobin: 12.1 g/dL (ref 12.0–15.0)
MCH: 27 pg (ref 26.0–34.0)
MCHC: 31.3 g/dL (ref 30.0–36.0)
MCV: 86.4 fL (ref 80.0–100.0)
Platelets: 222 K/uL (ref 150–400)
RBC: 4.48 MIL/uL (ref 3.87–5.11)
RDW: 15.3 % (ref 11.5–15.5)
WBC: 10 K/uL (ref 4.0–10.5)
nRBC: 0 % (ref 0.0–0.2)

## 2024-04-29 LAB — BRAIN NATRIURETIC PEPTIDE: B Natriuretic Peptide: 283.4 pg/mL — ABNORMAL HIGH (ref 0.0–100.0)

## 2024-04-29 LAB — BASIC METABOLIC PANEL WITH GFR
Anion gap: 12 (ref 5–15)
BUN: 12 mg/dL (ref 6–20)
CO2: 23 mmol/L (ref 22–32)
Calcium: 9.9 mg/dL (ref 8.9–10.3)
Chloride: 103 mmol/L (ref 98–111)
Creatinine, Ser: 1 mg/dL (ref 0.44–1.00)
GFR, Estimated: >60 mL/min (ref >60)
Glucose, Bld: 345 mg/dL — ABNORMAL HIGH (ref 70–99)
Potassium: 3.9 mmol/L (ref 3.5–5.1)
Sodium: 138 mmol/L (ref 135–145)

## 2024-04-29 LAB — HCG, SERUM, QUALITATIVE: Preg, Serum: NEGATIVE

## 2024-04-29 MED ORDER — DEXAMETHASONE 4 MG PO TABS
12.0000 mg | ORAL_TABLET | Freq: Once | ORAL | Status: AC
  Administered 2024-04-29: 12 mg via ORAL
  Filled 2024-04-29: qty 3

## 2024-04-29 MED ORDER — INSULIN ASPART 100 UNIT/ML IJ SOLN
5.0000 [IU] | Freq: Once | INTRAMUSCULAR | Status: AC
  Administered 2024-04-29: 5 [IU] via SUBCUTANEOUS
  Filled 2024-04-29: qty 5

## 2024-04-29 MED ORDER — LOSARTAN POTASSIUM 50 MG PO TABS
50.0000 mg | ORAL_TABLET | Freq: Every day | ORAL | Status: DC
Start: 2024-04-29 — End: 2024-04-30
  Administered 2024-04-29: 50 mg via ORAL
  Filled 2024-04-29: qty 1

## 2024-04-29 MED ORDER — ACETAMINOPHEN 325 MG PO TABS
650.0000 mg | ORAL_TABLET | Freq: Once | ORAL | Status: AC
  Administered 2024-04-29: 650 mg via ORAL
  Filled 2024-04-29: qty 2

## 2024-04-29 MED ORDER — FUROSEMIDE 20 MG PO TABS
20.0000 mg | ORAL_TABLET | Freq: Every day | ORAL | Status: DC
  Administered 2024-04-29: 20 mg via ORAL
  Filled 2024-04-29: qty 1

## 2024-04-29 MED ORDER — IPRATROPIUM-ALBUTEROL 0.5-2.5 (3) MG/3ML IN SOLN
3.0000 mL | Freq: Once | RESPIRATORY_TRACT | Status: AC
  Administered 2024-04-29: 3 mL via RESPIRATORY_TRACT
  Filled 2024-04-29: qty 3

## 2024-04-29 MED ORDER — AMLODIPINE BESYLATE 5 MG PO TABS
5.0000 mg | ORAL_TABLET | Freq: Every day | ORAL | Status: DC
  Administered 2024-04-29: 5 mg via ORAL
  Filled 2024-04-29: qty 1

## 2024-04-29 NOTE — ED Triage Notes (Addendum)
 Pt with hx of CHF c/o SOB and cough. Pt has been out of her Lasix  for approximately 10 days.

## 2024-04-29 NOTE — ED Provider Notes (Signed)
  EMERGENCY DEPARTMENT AT [facility] Provider Note   CSN: [id number] Arrival date & time: 04/29/24  2120     Patient presents with: Shortness of Breath   Julie [name] is a 48 y.o. female.  {Add pertinent medical, surgical, social history, OB history to YEP:67052} The history is provided by the patient.  Patient with extensive history including asthma, diabetes, substance use disorder, hypertension, obesity presents for her 15th ER visit in 6 months.  Patient reports increasing cough and shortness of breath and chest tightness.  No hemoptysis.  She reports that she lost all of her medications around 9 to 10 days ago when she was locked out of where she was staying No fevers or vomiting.  She reports increased lower extremity swelling and abdominal fullness.    Past Medical History:  Diagnosis Date   Asthma    Diabetes mellitus without complication (HCC)    Fracture, humerus closed, shaft 07/11/2014   left   History of bronchitis    Mental health problem    Open fracture of great toe of left foot 12/25/2015    Prior to Admission medications   Medication Sig Start Date End Date Taking? Authorizing Provider  albuterol  (VENTOLIN  HFA) 108 (90 Base) MCG/ACT inhaler Inhale 1-2 puffs into the lungs every 6 (six) hours as needed for wheezing or shortness of breath. 03/28/24  Yes Beverley Leita LABOR, PA-C  amLODipine  (NORVASC ) 5 MG tablet Take 1 tablet (5 mg total) by mouth daily. 03/28/24 04/27/24  Beverley Leita A, PA-C  cefadroxil (DURICEF) 500 MG capsule Take 1 capsule (500 mg total) by mouth 2 (two) times daily. 04/17/24   Prosperi, Christian H, PA-C  cyclobenzaprine  (FLEXERIL ) 10 MG tablet Take 1 tablet (10 mg total) by mouth 2 (two) times daily as needed for muscle spasms. 03/28/24   Beverley Leita LABOR, PA-C  furosemide  (LASIX ) 20 MG tablet Take 1 tablet (20 mg total) by mouth daily. 03/28/24 04/27/24  Beverley Leita LABOR, PA-C  losartan  (COZAAR ) 50 MG tablet Take 1 tablet (50  mg total) by mouth daily. 03/28/24 04/27/24  Beverley Leita LABOR, PA-C  metFORMIN  (GLUCOPHAGE ) 1000 MG tablet Take 1 tablet (1,000 mg total) by mouth 2 (two) times daily with a meal. 03/28/24   Beverley Leita LABOR, PA-C  risperiDONE  (RISPERDAL ) 0.5 MG tablet Take 1 tablet (0.5 mg total) by mouth 2 (two) times daily. 03/28/24   Beverley Leita LABOR, PA-C  sertraline  (ZOLOFT ) 50 MG tablet Take 1 tablet (50 mg total) by mouth daily. 03/28/24   Beverley Leita LABOR, PA-C  umeclidinium-vilanterol (ANORO ELLIPTA ) 62.5-25 MCG/ACT AEPB Inhale 1 puff into the lungs daily. 03/28/24   Beverley Leita LABOR, PA-C    Allergies: Patient has no known allergies.    Review of Systems  Constitutional:  Negative for fever.  Respiratory:  Positive for cough, shortness of breath and wheezing.   Cardiovascular:  Positive for leg swelling.  Gastrointestinal:  Negative for vomiting.    Updated Vital Signs BP (!) 155/114   Pulse (!) 102   Temp 99 F (37.2 C)   Resp (!) 27   Ht 1.778 m (5' 10)   Wt 126.1 kg   SpO2 100%   BMI 39.89 kg/m   Physical Exam CONSTITUTIONAL: Chronically ill-appearing, lying on her side watching television HEAD: Normocephalic/atraumatic EYES: EOMI/PERRL ENMT: Mucous membranes moist NECK: supple no meningeal signs CV: S1/S2 noted tachycardic LUNGS: Tachypnea, wheezing bilaterally ABDOMEN: soft, nontender, no rebound or guarding, obese GU:no cva tenderness NEURO: Pt  is awake/alert/appropriate, moves all extremitiesx4.  No facial droop.   EXTREMITIES: pulses normal/equal, full ROM, minimal edema to lower extremities SKIN: warm, color normal  (all labs ordered are listed, but only abnormal results are displayed) Labs Reviewed  BASIC METABOLIC PANEL WITH GFR - Abnormal; Notable for the following components:      Result Value   Glucose, Bld 345 (*)    All other components within normal limits  BRAIN NATRIURETIC PEPTIDE - Abnormal; Notable for the following components:   B Natriuretic Peptide 283.4 (*)     All other components within normal limits  CBC  HCG, SERUM, QUALITATIVE    EKG: EKG Interpretation Date/Time:  Sunday April 29 2024 22:02:39 EST Ventricular Rate:  111 PR Interval:  124 QRS Duration:  90 QT Interval:  360 QTC Calculation: 489 R Axis:   -45  Text Interpretation: Sinus tachycardia Right atrial enlargement Left axis deviation Pulmonary disease pattern Left ventricular hypertrophy with repolarization abnormality ( R in aVL , Cornell product ) Abnormal ECG Confirmed by Midge Golas ([id number]) on 04/29/2024 11:03:21 PM  Radiology: [facility] Chest 2 View Result Date: 04/29/2024 EXAM: 2 VIEW(S) XRAY OF THE CHEST 04/29/2024 10:46:00 PM COMPARISON: Chest x-ray 04/17/2024. CLINICAL HISTORY: shob FINDINGS: LUNGS AND PLEURA: No focal pulmonary opacity. No pulmonary edema. No pleural effusion. No pneumothorax. HEART AND MEDIASTINUM: The heart is enlarged, unchanged. BONES AND SOFT TISSUES: No acute osseous abnormality. IMPRESSION: 1. No acute findings. 2. Enlarged heart, unchanged. Electronically signed by: Greig Pique MD 04/29/2024 10:56 PM EST RP Workstation: [id number]    {Document cardiac monitor, telemetry assessment procedure when appropriate:32947} Procedures   Medications Ordered in the ED  amLODipine  (NORVASC ) tablet 5 mg (5 mg Oral Given 04/29/24 2331)  furosemide  (LASIX ) tablet 20 mg (20 mg Oral Given 04/29/24 2331)  losartan  (COZAAR ) tablet 50 mg (50 mg Oral Given 04/29/24 2331)  ipratropium-albuterol  (DUONEB) 0.5-2.5 (3) MG/3ML nebulizer solution 3 mL (3 mLs Nebulization Given 04/29/24 2331)  dexamethasone  (DECADRON ) tablet 12 mg (12 mg Oral Given 04/29/24 2331)  insulin  aspart (novoLOG ) injection 5 Units (5 Units Subcutaneous Given 04/29/24 2333)  acetaminophen  (TYLENOL ) tablet 650 mg (650 mg Oral Given 04/29/24 2330)      {Click here for ABCD2, HEART and other calculators REFRESH Note before signing:1}                              Medical Decision Making Amount  and/or Complexity of Data Reviewed Labs: ordered. Radiology: ordered.  Risk OTC drugs. Prescription drug management.   This patient presents to the ED for concern of shortness of breath, this involves an extensive number of treatment options, and is a complaint that carries with it a high risk of complications and morbidity.  The differential diagnosis includes but is not limited to Acute coronary syndrome, pneumonia, acute pulmonary edema, pneumothorax, acute anemia, pulmonary embolism   Comorbidities that complicate the patient evaluation: Patient's presentation is complicated by their history of hypertension, diabetes, asthma  Social Determinants of Health: Patient's lack of prescription access and substance use disorder  increases the complexity of managing their presentation  Additional history obtained: Records reviewed previous admission documents  Lab Tests: I Ordered, and personally interpreted labs.  The pertinent results include:  ***  Imaging Studies ordered: I ordered imaging studies including X-ray chest  I independently visualized and interpreted imaging which showed *** I agree with the radiologist interpretation  Cardiac Monitoring: The patient was maintained on  a cardiac monitor.  I personally viewed and interpreted the cardiac monitor which showed an underlying rhythm of:  sinus tachycardia  Medicines ordered and prescription drug management: I ordered medication including albuterol  for wheezing Reevaluation of the patient after these medicines showed that the patient    {resolved/improved/worsened:23923::improved}  Test Considered: Patient is low risk / negative by ***, therefore do not feel that *** is indicated.  Critical Interventions:  ***  Consultations Obtained: I requested consultation with the {consultation:26851}, and discussed  findings as well as pertinent plan - they recommend: ***  Reevaluation: After the interventions noted above, I  reevaluated the patient and found that they have :{resolved/improved/worsened:23923::improved}  Complexity of problems addressed: Patient's presentation is most consistent with  {RNEJ:73156}  Disposition: After consideration of the diagnostic results and the patient's response to treatment,  I feel that the patent would benefit from {disposition:26850}.     {Document critical care time when appropriate  Document review of labs and clinical decision tools ie CHADS2VASC2, etc  Document your independent review of radiology images and any outside records  Document your discussion with family members, caretakers and with consultants  Document social determinants of health affecting pt's care  Document your decision making why or why not admission, treatments were needed:32947:::1}   Final diagnoses:  None    ED Discharge Orders     None

## 2024-04-29 NOTE — ED Triage Notes (Signed)
 Patient reports she has been out of her lasix  for 9-10 days, she can feel the fluid on her abdomen and has fluid that needs to be drained. Patient also endorses foamy sputum when she coughs. Hx of HF, states her meds were thrown away when she was locked out of where she was staying.

## 2024-04-30 ENCOUNTER — Other Ambulatory Visit (HOSPITAL_COMMUNITY): Payer: Self-pay

## 2024-04-30 LAB — CBG MONITORING, ED: Glucose-Capillary: 333 mg/dL — ABNORMAL HIGH (ref 70–99)

## 2024-04-30 MED ORDER — SERTRALINE HCL 50 MG PO TABS
50.0000 mg | ORAL_TABLET | Freq: Every day | ORAL | 0 refills | Status: DC
  Filled 2024-04-30 – 2024-05-03 (×2): qty 30, 30d supply, fill #0

## 2024-04-30 MED ORDER — AMLODIPINE BESYLATE 5 MG PO TABS
5.0000 mg | ORAL_TABLET | Freq: Every day | ORAL | 0 refills | Status: DC
  Filled 2024-04-30 – 2024-05-03 (×2): qty 30, 30d supply, fill #0

## 2024-04-30 MED ORDER — RISPERIDONE 0.5 MG PO TABS
0.5000 mg | ORAL_TABLET | Freq: Every day | ORAL | 0 refills | Status: DC
  Filled 2024-04-30 – 2024-05-03 (×2): qty 30, 30d supply, fill #0

## 2024-04-30 MED ORDER — FUROSEMIDE 20 MG PO TABS
20.0000 mg | ORAL_TABLET | Freq: Every day | ORAL | 0 refills | Status: DC
  Filled 2024-04-30 – 2024-05-03 (×2): qty 30, 30d supply, fill #0

## 2024-04-30 MED ORDER — METFORMIN HCL 500 MG PO TABS
500.0000 mg | ORAL_TABLET | Freq: Two times a day (BID) | ORAL | 0 refills | Status: DC
  Filled 2024-04-30 – 2024-05-03 (×2): qty 60, 30d supply, fill #0

## 2024-04-30 MED ORDER — LOSARTAN POTASSIUM 50 MG PO TABS
50.0000 mg | ORAL_TABLET | Freq: Every day | ORAL | 0 refills | Status: DC
  Filled 2024-04-30 – 2024-05-03 (×2): qty 30, 30d supply, fill #0

## 2024-04-30 MED ORDER — ALBUTEROL SULFATE (2.5 MG/3ML) 0.083% IN NEBU
5.0000 mg/h | INHALATION_SOLUTION | Freq: Once | RESPIRATORY_TRACT | Status: AC
  Administered 2024-04-30: 5 mg/h via RESPIRATORY_TRACT
  Filled 2024-04-30: qty 3

## 2024-05-01 ENCOUNTER — Other Ambulatory Visit (HOSPITAL_COMMUNITY): Payer: Self-pay

## 2024-05-03 ENCOUNTER — Other Ambulatory Visit (HOSPITAL_COMMUNITY): Payer: Self-pay

## 2024-05-06 ENCOUNTER — Other Ambulatory Visit (HOSPITAL_COMMUNITY): Payer: Self-pay

## 2024-05-08 ENCOUNTER — Other Ambulatory Visit (HOSPITAL_COMMUNITY): Payer: Self-pay

## 2024-05-11 ENCOUNTER — Emergency Department (HOSPITAL_COMMUNITY)
Admission: EM | Admit: 2024-05-11 | Discharge: 2024-05-12 | Disposition: A | Discharge status: Home / Self Care | Payer: MEDICAID | Attending: Emergency Medicine | Admitting: Emergency Medicine

## 2024-05-11 DIAGNOSIS — I110 Hypertensive heart disease with heart failure: Secondary | ICD-10-CM | POA: Insufficient documentation

## 2024-05-11 DIAGNOSIS — E1165 Type 2 diabetes mellitus with hyperglycemia: Secondary | ICD-10-CM | POA: Insufficient documentation

## 2024-05-11 DIAGNOSIS — R739 Hyperglycemia, unspecified: Secondary | ICD-10-CM

## 2024-05-11 DIAGNOSIS — J45909 Unspecified asthma, uncomplicated: Secondary | ICD-10-CM | POA: Diagnosis not present

## 2024-05-11 DIAGNOSIS — M5431 Sciatica, right side: Secondary | ICD-10-CM | POA: Insufficient documentation

## 2024-05-11 DIAGNOSIS — I509 Heart failure, unspecified: Secondary | ICD-10-CM | POA: Diagnosis not present

## 2024-05-11 DIAGNOSIS — R000 Tachycardia, unspecified: Secondary | ICD-10-CM | POA: Diagnosis not present

## 2024-05-11 DIAGNOSIS — R0789 Other chest pain: Secondary | ICD-10-CM | POA: Diagnosis present

## 2024-05-11 DIAGNOSIS — Z7984 Long term (current) use of oral hypoglycemic drugs: Secondary | ICD-10-CM | POA: Diagnosis not present

## 2024-05-12 ENCOUNTER — Emergency Department (HOSPITAL_COMMUNITY): Payer: MEDICAID

## 2024-05-12 ENCOUNTER — Encounter (HOSPITAL_COMMUNITY): Payer: Self-pay | Admitting: *Deleted

## 2024-05-12 ENCOUNTER — Other Ambulatory Visit: Payer: Self-pay

## 2024-05-12 LAB — BASIC METABOLIC PANEL WITH GFR
Anion gap: 13 (ref 5–15)
BUN: 10 mg/dL (ref 6–20)
CO2: 25 mmol/L (ref 22–32)
Calcium: 10.1 mg/dL (ref 8.9–10.3)
Chloride: 104 mmol/L (ref 98–111)
Creatinine, Ser: 1.04 mg/dL — ABNORMAL HIGH (ref 0.44–1.00)
GFR, Estimated: >60 mL/min (ref >60)
Glucose, Bld: 535 mg/dL (ref 70–99)
Potassium: 4.1 mmol/L (ref 3.5–5.1)
Sodium: 142 mmol/L (ref 135–145)

## 2024-05-12 LAB — CBC
HCT: 40.1 % (ref 36.0–46.0)
Hemoglobin: 12.3 g/dL (ref 12.0–15.0)
MCH: 26.7 pg (ref 26.0–34.0)
MCHC: 30.7 g/dL (ref 30.0–36.0)
MCV: 87.2 fL (ref 80.0–100.0)
Platelets: 298 K/uL (ref 150–400)
RBC: 4.6 MIL/uL (ref 3.87–5.11)
RDW: 14.8 % (ref 11.5–15.5)
WBC: 7.5 K/uL (ref 4.0–10.5)
nRBC: 0 % (ref 0.0–0.2)

## 2024-05-12 LAB — CBG MONITORING, ED
Glucose-Capillary: 493 mg/dL — ABNORMAL HIGH (ref 70–99)
Glucose-Capillary: 282 mg/dL — ABNORMAL HIGH (ref 70–99)

## 2024-05-12 LAB — TROPONIN I (HIGH SENSITIVITY)
Troponin I (High Sensitivity): 43 ng/L — ABNORMAL HIGH (ref <18)
Troponin I (High Sensitivity): 42 ng/L — ABNORMAL HIGH (ref <18)

## 2024-05-12 LAB — HCG, SERUM, QUALITATIVE: Preg, Serum: NEGATIVE

## 2024-05-12 LAB — BRAIN NATRIURETIC PEPTIDE: B Natriuretic Peptide: 233.3 pg/mL — ABNORMAL HIGH (ref 0.0–100.0)

## 2024-05-12 MED ORDER — METHYLPREDNISOLONE SODIUM SUCC 125 MG IJ SOLR
125.0000 mg | INTRAMUSCULAR | Status: AC
  Administered 2024-05-12: 125 mg via INTRAVENOUS
  Filled 2024-05-12: qty 2

## 2024-05-12 MED ORDER — LOSARTAN POTASSIUM 50 MG PO TABS
50.0000 mg | ORAL_TABLET | ORAL | Status: AC
  Administered 2024-05-12: 50 mg via ORAL
  Filled 2024-05-12: qty 1

## 2024-05-12 MED ORDER — FUROSEMIDE 20 MG PO TABS
20.0000 mg | ORAL_TABLET | Freq: Once | ORAL | Status: AC
  Administered 2024-05-12: 20 mg via ORAL
  Filled 2024-05-12: qty 1

## 2024-05-12 MED ORDER — INSULIN ASPART 100 UNIT/ML IJ SOLN
10.0000 [IU] | Freq: Once | INTRAMUSCULAR | Status: AC
  Administered 2024-05-12: 10 [IU] via INTRAVENOUS
  Filled 2024-05-12: qty 10

## 2024-05-12 MED ORDER — IPRATROPIUM-ALBUTEROL 0.5-2.5 (3) MG/3ML IN SOLN
3.0000 mL | Freq: Once | RESPIRATORY_TRACT | Status: AC
  Administered 2024-05-12: 3 mL via RESPIRATORY_TRACT
  Filled 2024-05-12: qty 3

## 2024-05-12 MED ORDER — IOHEXOL 350 MG/ML SOLN
75.0000 mL | Freq: Once | INTRAVENOUS | Status: AC | PRN
  Administered 2024-05-12: 75 mL via INTRAVENOUS

## 2024-05-12 MED ORDER — SODIUM CHLORIDE 0.9 % IV BOLUS
1000.0000 mL | Freq: Once | INTRAVENOUS | Status: AC
  Administered 2024-05-12: 1000 mL via INTRAVENOUS

## 2024-05-12 MED ORDER — AMLODIPINE BESYLATE 5 MG PO TABS
5.0000 mg | ORAL_TABLET | Freq: Once | ORAL | Status: AC
  Administered 2024-05-12: 5 mg via ORAL
  Filled 2024-05-12: qty 1

## 2024-05-12 MED ORDER — ALBUTEROL SULFATE HFA 108 (90 BASE) MCG/ACT IN AERS: 1.0000 | INHALATION_SPRAY | Freq: Four times a day (QID) | RESPIRATORY_TRACT | 0 refills | Status: DC | PRN

## 2024-05-12 MED ORDER — INSULIN ASPART 100 UNIT/ML IJ SOLN
10.0000 [IU] | Freq: Once | INTRAMUSCULAR | Status: DC
  Filled 2024-05-12: qty 10

## 2024-05-12 NOTE — ED Triage Notes (Signed)
 Pt c/o coughing both feet and legs painful lower back  for 3-4 days  mid-chest pain for 5-6 hours

## 2024-05-12 NOTE — ED Provider Notes (Signed)
 [facility] Provider Note   CSN: [id number] Arrival date & time: 05/11/24  2348     Patient presents with: multiple complaints   Julie [name] is a 48 y.o. female with extensive medical history to include cocaine use disorder, asthma, lower extremity edema, BNP, acute on chronic CHF, hypertension, lower extremity edema, pulmonary edema.  Presents to ED complaining of lower extremity edema, chest tightness and shortness of breath.  States that on Monday she woke up with chest tightness, called EMS who provided her with breathing treatment and she refused transport.  States that starting about 6 hours ago she developed chest tightness located centrally without radiation.  She denies any nausea, vomiting.  Denies abdominal pain.  Endorsing lower extremity edema which she also noticed around the same time as her chest tightness began.  She states that she has not taken her Lasix  today but has been compliant before today.  States that she has not taken blood pressure medication today.  Denies headache, blurred vision.  Denies fevers at home.  Also complaining of back pain but states that back pain is right-sided, radiates into her right lower extremity and reports that it feels very similar to previous sciatica flares.  Denies red flag symptoms of low back pain.  Reports compliance on diabetic medications.  Arrives with sugar of 535.  HPI     Prior to Admission medications   Medication Sig Start Date End Date Taking? Authorizing Provider  albuterol  (VENTOLIN  HFA) 108 (90 Base) MCG/ACT inhaler Inhale 1-2 puffs into the lungs every 6 (six) hours as needed for wheezing or shortness of breath. 05/12/24  Yes Ruthell Lonni FALCON, PA-C  amLODipine  (NORVASC ) 5 MG tablet Take 1 tablet (5 mg total) by mouth daily. 04/30/24  Yes Midge Golas, MD  furosemide  (LASIX ) 20 MG tablet Take 1 tablet (20 mg total) by mouth daily. 04/30/24  Yes Midge Golas, MD   losartan  (COZAAR ) 50 MG tablet Take 1 tablet (50 mg total) by mouth daily. 04/30/24  Yes Midge Golas, MD  metFORMIN  (GLUCOPHAGE ) 500 MG tablet Take 1 tablet (500 mg total) by mouth 2 (two) times daily with a meal. 04/30/24  Yes Midge Golas, MD  risperiDONE  (RISPERDAL ) 0.5 MG tablet Take 1 tablet (0.5 mg total) by mouth at bedtime. 04/30/24  Yes Midge Golas, MD  sertraline  (ZOLOFT ) 50 MG tablet Take 1 tablet (50 mg total) by mouth daily. 04/30/24  Yes Midge Golas, MD  umeclidinium-vilanterol (ANORO ELLIPTA ) 62.5-25 MCG/ACT AEPB Inhale 1 puff into the lungs daily. Patient not taking: Reported on 05/12/2024 03/28/24   Beverley Leita LABOR, PA-C    Allergies: Patient has no known allergies.    Review of Systems  Respiratory:  Positive for shortness of breath.   Cardiovascular:  Positive for chest pain.  All other systems reviewed and are negative.   Updated Vital Signs BP (!) 157/94   Pulse (!) 104   Temp 98.4 F (36.9 C) (Oral)   Resp 20   Ht 5' 10 (1.778 m)   Wt 126.1 kg   SpO2 100%   BMI 39.89 kg/m   Physical Exam Vitals and nursing note reviewed.  Constitutional:      General: She is not in acute distress.    Appearance: She is well-developed.  HENT:     Head: Normocephalic and atraumatic.  Eyes:     Conjunctiva/sclera: Conjunctivae normal.  Cardiovascular:     Rate and Rhythm: Regular rhythm. Tachycardia present.  Heart sounds: No murmur heard. Pulmonary:     Effort: Pulmonary effort is normal. No respiratory distress.     Breath sounds: Wheezing present.  Abdominal:     Palpations: Abdomen is soft.     Tenderness: There is no abdominal tenderness.  Musculoskeletal:        General: No swelling.     Cervical back: Neck supple.     Right lower leg: No edema.     Left lower leg: No edema.  Skin:    General: Skin is warm and dry.     Capillary Refill: Capillary refill takes less than 2 seconds.  Neurological:     Mental Status: She is alert and  oriented to person, place, and time. Mental status is at baseline.     Comments: CN III through XII intact.  Intact finger-to-nose, heel-to-shin.  No pronator drift.  No slurred speech.  Equal strength upper extremities bilaterally.  Equal strength lower extremities bilaterally.  No pronator drift.  No slurred speech.  Alert and oriented x 4.  Tracks past midline.  PERRL.  Psychiatric:        Mood and Affect: Mood normal.     (all labs ordered are listed, but only abnormal results are displayed) Labs Reviewed  BASIC METABOLIC PANEL WITH GFR - Abnormal; Notable for the following components:      Result Value   Glucose, Bld 535 (*)    Creatinine, Ser 1.04 (*)    All other components within normal limits  BRAIN NATRIURETIC PEPTIDE - Abnormal; Notable for the following components:   B Natriuretic Peptide 233.3 (*)    All other components within normal limits  CBG MONITORING, ED - Abnormal; Notable for the following components:   Glucose-Capillary 493 (*)    All other components within normal limits  CBG MONITORING, ED - Abnormal; Notable for the following components:   Glucose-Capillary 282 (*)    All other components within normal limits  TROPONIN I (HIGH SENSITIVITY) - Abnormal; Notable for the following components:   Troponin I (High Sensitivity) 43 (*)    All other components within normal limits  TROPONIN I (HIGH SENSITIVITY) - Abnormal; Notable for the following components:   Troponin I (High Sensitivity) 42 (*)    All other components within normal limits  CBC  HCG, SERUM, QUALITATIVE    EKG: None  Radiology: CT Angio Chest PE W and/or Wo Contrast Result Date: 05/12/2024 EXAM: CTA of the Chest with contrast for PE 05/12/2024 04:39:40 AM TECHNIQUE: CTA of the chest was performed after the administration of intravenous contrast. Multiplanar reformatted images are provided for review. MIP images are provided for review. Automated exposure control, iterative reconstruction,  and/or weight based adjustment of the mA/kV was utilized to reduce the radiation dose to as low as reasonably achievable. Exam detail diminished by motion artifact. COMPARISON: 10/10/23 CLINICAL HISTORY: Pulmonary embolism (PE) suspected, high prob. FINDINGS: PULMONARY ARTERIES: Pulmonary arteries are adequately opacified for evaluation. No pulmonary embolism. Main pulmonary artery is normal in caliber. MEDIASTINUM: Mild cardiac enlargement. Mild aortic atherosclerotic calcifications. The ascending thoracic aorta demonstrates a lesion measuring 4.2 cm. Refer to image 105/8. Unchanged compared to the prior exam. LYMPH NODES: No mediastinal, hilar or axillary lymphadenopathy. LUNGS AND PLEURA: Ground glass attenuation with patchy areas of increased lucency compatible with small airways disease. Subsegmental atelectasis within the lingula and right middle lobe. No consolidative change or pneumothorax. No pleural effusion. UPPER ABDOMEN: No acute abnormality within the imaged portions of the upper  abdomen. Bilateral low attenuation adrenal nodules are again noted compatible with benign adenomas. Unchanged from previous exam no follow-up imaging recommended. SOFT TISSUES AND BONES: No acute bone or soft tissue abnormality. IMPRESSION: 1. No pulmonary embolism. 2. Ground glass attenuation with patchy areas of increased lucency compatible with small airways disease. 3. Subsegmental atelectasis within the lingula and right middle lobe. 4. Aortic atherosclerosis and coronary artery disease calcifications. 5. Ascending thoracic aortic aneurysm measuring 4.2 cm. Unchanged. Recommend annual imaging followup by CTA or MRA. This recommendation follows 2010 ACCF/AHA/AATS/ACR/ASA/SCA/SCAI/SIR/STS/SVM Guidelines for the Diagnosis and Management of Patients with Thoracic Aortic Disease. Circulation. 2010; 121: Z733-z630. Aortic aneurysm NOS (ICD10-I71.9) Electronically signed by: Waddell Calk MD 05/12/2024 05:00 AM EST RP  Workstation: [id number]   DG Chest 2 View Result Date: 05/12/2024 EXAM: 2 VIEW(S) XRAY OF THE CHEST 05/12/2024 12:36:50 AM COMPARISON: 04/29/2024 CLINICAL HISTORY: chest pain for 5 hours FINDINGS: LUNGS AND PLEURA: Low lung volume. Mild-to-moderate diffuse mild pulmonary edema. Elevated right hemidiaphragm. No pleural effusion. No pneumothorax. HEART AND MEDIASTINUM: Stable Moderate cardiomegaly. BONES AND SOFT TISSUES: No acute osseous abnormality. IMPRESSION: 1. Mild-to-moderate diffuse pulmonary edema. 2. Stable moderate cardiomegaly. Electronically signed by: Oneil Devonshire MD 05/12/2024 12:40 AM EST RP Workstation: [id number]    Procedures   Medications Ordered in the ED  sodium chloride  0.9 % bolus 1,000 mL (1,000 mLs Intravenous New Bag/Given 05/12/24 0251)  amLODipine  (NORVASC ) tablet 5 mg (5 mg Oral Given 05/12/24 0257)  losartan  (COZAAR ) tablet 50 mg (50 mg Oral Given 05/12/24 0256)  ipratropium-albuterol  (DUONEB) 0.5-2.5 (3) MG/3ML nebulizer solution 3 mL (3 mLs Nebulization Given 05/12/24 0301)  methylPREDNISolone  sodium succinate (SOLU-MEDROL ) 125 mg/2 mL injection 125 mg (125 mg Intravenous Given 05/12/24 0253)  insulin  aspart (novoLOG ) injection 10 Units (10 Units Intravenous Given 05/12/24 0255)  furosemide  (LASIX ) tablet 20 mg (20 mg Oral Given 05/12/24 0317)  ipratropium-albuterol  (DUONEB) 0.5-2.5 (3) MG/3ML nebulizer solution 3 mL (3 mLs Nebulization Given 05/12/24 0340)  iohexol  (OMNIPAQUE ) 350 MG/ML injection 75 mL (75 mLs Intravenous Contrast Given 05/12/24 0440)    Medical Decision Making Amount and/or Complexity of Data Reviewed Labs: ordered.  Risk Prescription drug management.   48 year old female presenting to the ED complaining of chest tightness, shortness of breath.  On arrival, patient tachycardic, afebrile.  Lung sounds have wheezing throughout, no hypoxia.  Abdomen is soft and compressible.  Neuroexam at baseline.  No edema to bilateral lower extremities.   Patient arrives hypertensive but reports she has not taken blood pressure medication today.  Arrives with elevated blood glucose, states he takes metformin .  Assessed utilizing CBC, BMP, troponin x 2, BNP, chest x-ray, EKG.  Patient given DuoNeb, 125 Solu-Medrol , 20 Lykens of Lasix  as she reports that she has not taken her Lasix  today.  Given home blood pressure medications.  CBC without leukocytosis or anemia.  Metabolic panel with glucose 535, creatinine 1.04.  Anion gap 13.  No DKA.  Electrolytes unremarkable.  Troponin 43, 42 delta.  Troponins are flat.  BNP to 33.3.  Chest x-ray shows stable cardiomegaly, mild to moderate diffuse pulmonary edema.  CT obtained at this time due to tachycardia and shortness of breath.  CT unremarkable, no PE.  Does show ground glass attenuation with patchy areas of increased lucency compatible with small airways disease.  Also shows subsegmental atelectasis within the lingula and right middle lobe.  Also shows aortic atherosclerosis and coronary artery disease calcifications.  Patient reassessed after initial DuoNeb belies her and continues to wheeze.  Patient received  additional DuoNeb laser at this time with good effect.  Patient ambulated through the department and maintained oxygen saturation.  Patient is still slightly tachycardic however most likely due to albuterol .  The patient CBG has decreased to 282 with 10 units of insulin  and 1 L of fluid.  At this time the patient is stable to discharge home.  Her right side low back pain is consistent with sciatica.  She has no red flag symptoms of low back pain.  She was advised to take Tylenol  at home for pain.  Reports she already has muscle relaxers in her possession.  I encouraged her to take these and follow-up with PCP.  For asthma exacerbation, will refill patient on albuterol  inhaler.  Patient diabetes is uncontrolled and I feel she would not benefit from steroids for asthma as this would worsen her  uncontrolled blood glucose.  I encouraged her to continue taking her home medication such as metformin  and she voiced understanding.  She is stable to discharge at this time.    Final diagnoses:  Uncomplicated asthma, unspecified asthma severity, unspecified whether persistent  Hyperglycemia  Right sided sciatica    ED Discharge Orders          Ordered    albuterol  (VENTOLIN  HFA) 108 (90 Base) MCG/ACT inhaler  Every 6 hours PRN        05/12/24 0556               Ruthell Lonni FALCON, PA-C 05/12/24 0557    Darra Fonda MATSU, MD 05/13/24 [id number]

## 2024-05-12 NOTE — ED Notes (Signed)
 Lab has called a glucose of 535 charge notified  acuity increased

## 2024-05-12 NOTE — Discharge Instructions (Signed)
 It was a pleasure taking part in your care.  As discussed, your shortness of breath is most likely due to asthma.  Please utilize albuterol  inhaler, duo nebulizers at home.  For your low back pain, most likely due to sciatica.  Please begin taking Tylenol  and muscle relaxers at home as discussed.  For your hyperglycemia, you are given insulin  and fluid and your blood glucose decreased appropriately.  Please continue taking metformin  at home, monitoring blood glucose at home and follow-up with PCP.  Return to ED with new symptoms.

## 2024-05-30 ENCOUNTER — Emergency Department (HOSPITAL_COMMUNITY): Payer: MEDICAID

## 2024-05-30 ENCOUNTER — Emergency Department (HOSPITAL_COMMUNITY)
Admission: EM | Admit: 2024-05-30 | Discharge: 2024-05-30 | Disposition: A | Discharge status: Home / Self Care | Payer: MEDICAID

## 2024-05-30 ENCOUNTER — Other Ambulatory Visit: Payer: Self-pay

## 2024-05-30 ENCOUNTER — Encounter (HOSPITAL_COMMUNITY): Payer: Self-pay | Admitting: Emergency Medicine

## 2024-05-30 ENCOUNTER — Telehealth (INDEPENDENT_AMBULATORY_CARE_PROVIDER_SITE_OTHER): Payer: Self-pay | Admitting: Otolaryngology

## 2024-05-30 DIAGNOSIS — I110 Hypertensive heart disease with heart failure: Secondary | ICD-10-CM | POA: Insufficient documentation

## 2024-05-30 DIAGNOSIS — R220 Localized swelling, mass and lump, head: Secondary | ICD-10-CM | POA: Diagnosis present

## 2024-05-30 DIAGNOSIS — J45909 Unspecified asthma, uncomplicated: Secondary | ICD-10-CM | POA: Diagnosis not present

## 2024-05-30 DIAGNOSIS — N390 Urinary tract infection, site not specified: Secondary | ICD-10-CM | POA: Diagnosis not present

## 2024-05-30 DIAGNOSIS — Z79899 Other long term (current) drug therapy: Secondary | ICD-10-CM | POA: Diagnosis not present

## 2024-05-30 DIAGNOSIS — E119 Type 2 diabetes mellitus without complications: Secondary | ICD-10-CM | POA: Insufficient documentation

## 2024-05-30 DIAGNOSIS — Z7984 Long term (current) use of oral hypoglycemic drugs: Secondary | ICD-10-CM | POA: Diagnosis not present

## 2024-05-30 DIAGNOSIS — J36 Peritonsillar abscess: Secondary | ICD-10-CM | POA: Diagnosis not present

## 2024-05-30 DIAGNOSIS — F17210 Nicotine dependence, cigarettes, uncomplicated: Secondary | ICD-10-CM | POA: Diagnosis not present

## 2024-05-30 DIAGNOSIS — A499 Bacterial infection, unspecified: Secondary | ICD-10-CM

## 2024-05-30 DIAGNOSIS — I509 Heart failure, unspecified: Secondary | ICD-10-CM | POA: Diagnosis not present

## 2024-05-30 LAB — COMPREHENSIVE METABOLIC PANEL WITH GFR
ALT: 13 U/L (ref 0–44)
AST: 19 U/L (ref 15–41)
Albumin: 3.4 g/dL — ABNORMAL LOW (ref 3.5–5.0)
Alkaline Phosphatase: 66 U/L (ref 38–126)
Anion gap: 7 (ref 5–15)
BUN: 5 mg/dL — ABNORMAL LOW (ref 6–20)
CO2: 27 mmol/L (ref 22–32)
Calcium: 10 mg/dL (ref 8.9–10.3)
Chloride: 105 mmol/L (ref 98–111)
Creatinine, Ser: 0.91 mg/dL (ref 0.44–1.00)
GFR, Estimated: >60 mL/min (ref >60)
Glucose, Bld: 347 mg/dL — ABNORMAL HIGH (ref 70–99)
Potassium: 3.9 mmol/L (ref 3.5–5.1)
Sodium: 139 mmol/L (ref 135–145)
Total Bilirubin: 0.5 mg/dL (ref 0.0–1.2)
Total Protein: 7.3 g/dL (ref 6.5–8.1)

## 2024-05-30 LAB — GROUP A STREP BY PCR: Group A Strep by PCR: NOT DETECTED

## 2024-05-30 LAB — CBC
HCT: 47.1 % — ABNORMAL HIGH (ref 36.0–46.0)
Hemoglobin: 14.6 g/dL (ref 12.0–15.0)
MCH: 26.6 pg (ref 26.0–34.0)
MCHC: 31 g/dL (ref 30.0–36.0)
MCV: 85.9 fL (ref 80.0–100.0)
Platelets: 224 K/uL (ref 150–400)
RBC: 5.48 MIL/uL — ABNORMAL HIGH (ref 3.87–5.11)
RDW: 14 % (ref 11.5–15.5)
WBC: 7.3 K/uL (ref 4.0–10.5)
nRBC: 0 % (ref 0.0–0.2)

## 2024-05-30 LAB — URINALYSIS, ROUTINE W REFLEX MICROSCOPIC
Bilirubin Urine: NEGATIVE
Glucose, UA: >=500 mg/dL — AB
Ketones, ur: NEGATIVE mg/dL
Nitrite: POSITIVE — AB
Protein, ur: NEGATIVE mg/dL
Specific Gravity, Urine: 1.01 (ref 1.005–1.030)
pH: 6 (ref 5.0–8.0)

## 2024-05-30 LAB — URINALYSIS, MICROSCOPIC (REFLEX)

## 2024-05-30 LAB — LIPASE, BLOOD: Lipase: 30 U/L (ref 11–51)

## 2024-05-30 LAB — HCG, SERUM, QUALITATIVE: Preg, Serum: NEGATIVE

## 2024-05-30 LAB — TSH: TSH: 3.976 u[IU]/mL (ref 0.350–4.500)

## 2024-05-30 MED ORDER — CEFPODOXIME PROXETIL 200 MG PO TABS: 200.0000 mg | ORAL_TABLET | Freq: Two times a day (BID) | ORAL | 0 refills | Status: AC

## 2024-05-30 MED ORDER — LIDOCAINE-EPINEPHRINE 1 %-1:100000 IJ SOLN
10.0000 mL | Freq: Once | INTRAMUSCULAR | Status: AC
  Administered 2024-05-30: 10 mL via INTRADERMAL
  Filled 2024-05-30: qty 1

## 2024-05-30 MED ORDER — BENZOCAINE 20 % MT AERO
INHALATION_SPRAY | Freq: Once | OROMUCOSAL | Status: DC
  Filled 2024-05-30: qty 57

## 2024-05-30 MED ORDER — BUTAMBEN-TETRACAINE-BENZOCAINE 2-2-14 % EX AERO
1.0000 | INHALATION_SPRAY | Freq: Once | CUTANEOUS | Status: DC
  Filled 2024-05-30 (×2): qty 20

## 2024-05-30 MED ORDER — AMOXICILLIN-POT CLAVULANATE 875-125 MG PO TABS: 1.0000 | ORAL_TABLET | Freq: Two times a day (BID) | ORAL | 0 refills | Status: AC

## 2024-05-30 MED ORDER — METHYLPREDNISOLONE 4 MG PO TBPK: ORAL_TABLET | ORAL | 0 refills | Status: DC

## 2024-05-30 MED ORDER — SODIUM CHLORIDE 0.9 % IV SOLN
2.0000 g | Freq: Once | INTRAVENOUS | Status: AC
  Administered 2024-05-30: 2 g via INTRAVENOUS
  Filled 2024-05-30: qty 20

## 2024-05-30 MED ORDER — IOHEXOL 350 MG/ML SOLN
100.0000 mL | Freq: Once | INTRAVENOUS | Status: AC | PRN
  Administered 2024-05-30: 100 mL via INTRAVENOUS

## 2024-05-30 NOTE — ED Provider Notes (Signed)
 [place] EMERGENCY DEPARTMENT AT [facility] Provider Note   CSN: [id number] Arrival date & time: 05/30/24  9689     Patient presents with: Oral Swelling   Julie [name] is a 48 y.o. female.   HPI    Patient presents with multiple complaints.  Endorsing swelling of the throat.  She states has been swelling over the past couple of days.  She states that she has had abscess in the past that had to be drained.  Endorses pain with swallowing.  Been able to tolerate liquids.  No difficulty breathing.  Tolerating her secretions.  Denies any obvious fever or chills.  Denies any chest pain or shortness of breath.  Endorses some episodes of nausea as well as lower abdominal pain.  Diarrhea 2 days ago.  No hematemesis or hematochezia.  Patient also states that she has been having some bumps around her labia.   No abnormal pelvic discharge.   Previous medical history reviewed : medical history to include cocaine use disorder, asthma, lower extremity edema, BNP, acute on chronic CHF, hypertension, lower extremity edema, pulmonary edema.  Last seen in the ED on November 22.  Shortness of breath.   Prior to Admission medications   Medication Sig Start Date End Date Taking? Authorizing Provider  amoxicillin -clavulanate (AUGMENTIN ) 875-125 MG tablet Take 1 tablet by mouth every 12 (twelve) hours for 14 days. 05/30/24 06/13/24 Yes Simon Lavonia SAILOR, MD  cefpodoxime (VANTIN) 200 MG tablet Take 1 tablet (200 mg total) by mouth 2 (two) times daily for 10 days. 05/30/24 06/09/24 Yes Simon Lavonia SAILOR, MD  methylPREDNISolone  (MEDROL  DOSEPAK) 4 MG TBPK tablet Take as directed on package 05/30/24  Yes Simon Lavonia SAILOR, MD  albuterol  (VENTOLIN  HFA) 108 (90 Base) MCG/ACT inhaler Inhale 1-2 puffs into the lungs every 6 (six) hours as needed for wheezing or shortness of breath. 05/12/24   Ruthell Lonni FALCON, PA-C  amLODipine  (NORVASC ) 5 MG tablet Take 1 tablet (5 mg total) by mouth daily. 04/30/24    Midge Golas, MD  furosemide  (LASIX ) 20 MG tablet Take 1 tablet (20 mg total) by mouth daily. 04/30/24   Midge Golas, MD  losartan  (COZAAR ) 50 MG tablet Take 1 tablet (50 mg total) by mouth daily. 04/30/24   Midge Golas, MD  metFORMIN  (GLUCOPHAGE ) 500 MG tablet Take 1 tablet (500 mg total) by mouth 2 (two) times daily with a meal. 04/30/24   Midge Golas, MD  risperiDONE  (RISPERDAL ) 0.5 MG tablet Take 1 tablet (0.5 mg total) by mouth at bedtime. 04/30/24   Midge Golas, MD  sertraline  (ZOLOFT ) 50 MG tablet Take 1 tablet (50 mg total) by mouth daily. 04/30/24   Midge Golas, MD  umeclidinium-vilanterol (ANORO ELLIPTA ) 62.5-25 MCG/ACT AEPB Inhale 1 puff into the lungs daily. Patient not taking: Reported on 05/12/2024 03/28/24   Beverley Leita LABOR, PA-C    Allergies: Patient has no known allergies.    Review of Systems  Constitutional:  Negative for chills and fever.  HENT:  Negative for ear pain and sore throat.   Eyes:  Negative for pain and visual disturbance.  Respiratory:  Negative for cough and shortness of breath.   Cardiovascular:  Negative for chest pain and palpitations.  Gastrointestinal:  Negative for abdominal pain and vomiting.  Genitourinary:  Negative for dysuria and hematuria.  Musculoskeletal:  Negative for arthralgias and back pain.  Skin:  Negative for color change and rash.  Neurological:  Negative for seizures and syncope.  All other systems reviewed  and are negative.   Updated Vital Signs BP (!) 173/86   Pulse 93   Temp 98.1 F (36.7 C) (Oral)   Resp 17   Ht 5' 10 (1.778 m)   Wt 136 kg   SpO2 94%   BMI 43.02 kg/m   Physical Exam Vitals and nursing note reviewed.  Constitutional:      General: She is not in acute distress.    Appearance: She is well-developed.  HENT:     Head: Normocephalic and atraumatic.  Eyes:     Conjunctiva/sclera: Conjunctivae normal.  Cardiovascular:     Rate and Rhythm: Normal rate and regular rhythm.      Heart sounds: No murmur heard. Pulmonary:     Effort: Pulmonary effort is normal. No respiratory distress.     Breath sounds: Normal breath sounds.  Abdominal:     Palpations: Abdomen is soft.     Tenderness: There is no abdominal tenderness.  Musculoskeletal:        General: No swelling.     Cervical back: Neck supple.  Skin:    General: Skin is warm and dry.     Capillary Refill: Capillary refill takes less than 2 seconds.  Neurological:     Mental Status: She is alert.  Psychiatric:        Mood and Affect: Mood normal.     (all labs ordered are listed, but only abnormal results are displayed) Labs Reviewed  COMPREHENSIVE METABOLIC PANEL WITH GFR - Abnormal; Notable for the following components:      Result Value   Glucose, Bld 347 (*)    BUN 5 (*)    Albumin 3.4 (*)    All other components within normal limits  CBC - Abnormal; Notable for the following components:   RBC 5.48 (*)    HCT 47.1 (*)    All other components within normal limits  URINALYSIS, ROUTINE W REFLEX MICROSCOPIC - Abnormal; Notable for the following components:   APPearance HAZY (*)    Glucose, UA >=500 (*)    Hgb urine dipstick TRACE (*)    Nitrite POSITIVE (*)    Leukocytes,Ua TRACE (*)    All other components within normal limits  URINALYSIS, MICROSCOPIC (REFLEX) - Abnormal; Notable for the following components:   Bacteria, UA MANY (*)    All other components within normal limits  GROUP A STREP BY PCR  AEROBIC/ANAEROBIC CULTURE W GRAM STAIN (SURGICAL/DEEP WOUND)  URINE CULTURE  LIPASE, BLOOD  HCG, SERUM, QUALITATIVE  TSH    EKG: EKG Interpretation Date/Time:  Wednesday May 30 2024 08:38:21 EST Ventricular Rate:  103 PR Interval:  131 QRS Duration:  96 QT Interval:  379 QTC Calculation: 497 R Axis:   -29  Text Interpretation: Sinus tachycardia Right atrial enlargement Abnormal R-wave progression, late transition LVH with secondary repolarization abnormality Anterior ST  elevation, probably due to LVH Borderline prolonged QT interval Confirmed by Simon Rea [phone]) on 05/30/2024 11:08:45 AM  Radiology: CT ABDOMEN PELVIS W CONTRAST Result Date: 05/30/2024 CLINICAL DATA:  Lower abdominal pain with nausea and diarrhea. Evaluate for diverticulitis. EXAM: CT ABDOMEN AND PELVIS WITH CONTRAST TECHNIQUE: Multidetector CT imaging of the abdomen and pelvis was performed using the standard protocol following bolus administration of intravenous contrast. RADIATION DOSE REDUCTION: This exam was performed according to the departmental dose-optimization program which includes automated exposure control, adjustment of the mA and/or kV according to patient size and/or use of iterative reconstruction technique. CONTRAST:  100mL OMNIPAQUE  IOHEXOL  350 MG/ML  SOLN COMPARISON:  04/17/2024 FINDINGS: Lower chest: Dependent atelectasis. Hepatobiliary: No suspicious focal abnormality within the liver parenchyma. Gallbladder is nondistended. No intrahepatic or extrahepatic biliary dilation. Pancreas: No focal mass lesion. No dilatation of the main duct. No intraparenchymal cyst. No peripancreatic edema. Spleen: No splenomegaly. No suspicious focal mass lesion. Adrenals/Urinary Tract: Left adrenal nodules are stable comparing back to a CT from 10/31/2021 and the more cranial of the 2 nodules has low density consistent with adenoma. 2 cm right adrenal nodule also stable since the 2023 exam. Interval stability and CT appearance is most compatible with benign etiology such as adenomas. 3.0 cm simple appearing cyst in the upper pole left kidney is also stable since the 2023 exam. No suspicious enhancing renal mass lesion. No evidence for right hydroureter. There is some distal left ureteral fullness and edema around the distal left ureter (a see images 70-79 of series 3. Next renally subtle punctate focus of increased attenuation is seen along the mucosal surface of the left posterior bladder wall on axial  90/3 and perhaps better demonstrated on coronal 45/6. This is in close proximity to the UVJ but appears to be just cranial to it. Potentially a punctate stone that has passed into the bladder lumen, given the history of smoking, mucosal neoplasm in the region of the UVJ is a possibility. Stomach/Bowel: Stomach is unremarkable. No gastric wall thickening. No evidence of outlet obstruction. Duodenum is normally positioned as is the ligament of Treitz. No small bowel wall thickening. No small bowel dilatation. The terminal ileum is normal. The appendix is normal. No gross colonic mass. No colonic wall thickening. Diverticular changes are noted in the left colon without evidence of diverticulitis. Vascular/Lymphatic: No abdominal aortic aneurysm. No abdominal lymphadenopathy No pelvic sidewall lymphadenopathy. Reproductive: Uterine fibroids evident.  There is no adnexal mass. Other: No intraperitoneal free fluid. Musculoskeletal: No worrisome lytic or sclerotic osseous abnormality. IMPRESSION: 1. Mild distal left ureteral fullness and edema around the distal left ureter. Very subtle punctate hyperdensity in the posterior left bladder along the mucosal surface is potentially a punctate stone that has passed into the bladder lumen. Urinary tract infection would be a consideration. Given the history of smoking and some potential soft tissue fullness in the UVJ region, mucosal neoplasm must be excluded. Urology consultation recommended. Follow-up hematuria protocol CT may prove helpful. 2. Left colonic diverticulosis without diverticulitis. 3. Uterine fibroids. 4. Stable bilateral adrenal nodules comparing back to a CT from 10/31/2021. Interval stability and CT appearance is most compatible with benign etiology such as adenomas. Electronically Signed   By: Camellia Candle M.D.   On: 05/30/2024 09:58   CT Soft Tissue Neck W Contrast Result Date: 05/30/2024 EXAM: CT NECK WITH CONTRAST 05/30/2024 09:15:18 AM TECHNIQUE: CT of  the neck was performed with the administration of 100 mL of iohexol  (OMNIPAQUE ) 350 MG/ML injection. Multiplanar reformatted images are provided for review. Automated exposure control, iterative reconstruction, and/or weight based adjustment of the mA/kV was utilized to reduce the radiation dose to as low as reasonably achievable. COMPARISON: CT neck 06/28/2023. CLINICAL HISTORY: History of peritonsillar abscess. Increasing neck swelling. FINDINGS: AERODIGESTIVE TRACT: There is a 2.0 x 1.4 x 1.6 cm peripherally enhancing collection adjacent to the right palatine tonsil compatible with a peritonsillar abscess. There is associated enlargement of the palatine tonsils, more pronounced on the right. Edema of the oropharyngeal mucosa noted. There is additional enhancement along the nasopharyngeal and oropharyngeal mucosa. Mild narrowing of the oropharyngeal airway. The oral cavity and floor  of mouth are unremarkable. Right aspect of the enlarged right palatine tonsil abuts the lateral aspect of the epiglottis. There is no edema of the epiglottis. The basilar aspect of the enlarged right palatine tonsil is unremarkable. Normal appearance of the retropharynx. Normal appearance of the supraglottic airway. Symmetric appearance of the vocal folds. Dental caries and multiple periapical lucencies, particularly involving multiple left sided maxillary molars. SALIVARY GLANDS: The parotid and submandibular glands are unremarkable. THYROID : Unremarkable. LYMPH NODES: Enlarged right level 2a cervical nodes measuring at least 1.0 cm in short axis. There are additional subcentimeter right level 1b and right level 3 nodes. SOFT TISSUES: There is a cystic focus in the subcutaneous tissues of the inferior neck left of midline likely reflecting an epidermal inclusion cyst. BRAIN, ORBITS, SINUSES AND MASTOIDS: Atherosclerosis noted within the carotid siphons. Mucosal thickening in the bilateral maxillary sinuses, left greater than right. No  acute abnormality of the brain, orbits, or mastoids. LUNGS AND MEDIASTINUM: No acute abnormality. BONES: No focal bone abnormality. IMPRESSION: 1. 2.0 x 1.4 x 1.6 cm collection adjacent to the right palatine tonsil compatible with a peritonsillar abscess. 2. Associated enlargement of the palatine tonsils (more pronounced on the right) and edema of the oropharyngeal mucosa. 3. Enlarged right level 2a cervical nodes measuring at least 1.0 cm in short axis, likely reactive. Electronically signed by: Donnice Mania MD 05/30/2024 09:44 AM EST RP Workstation: [id number]     Procedures   Medications Ordered in the ED  cefTRIAXone  (ROCEPHIN ) 2 g in sodium chloride  0.9 % 100 mL IVPB (has no administration in time range)  lidocaine -EPINEPHrine  (XYLOCAINE  W/EPI) 1 %-1:100000 (with pres) injection 10 mL (has no administration in time range)  butamben -tetracaine -benzocaine  (CETACAINE ) spray 1 spray (has no administration in time range)  iohexol  (OMNIPAQUE ) 350 MG/ML injection 100 mL (100 mLs Intravenous Contrast Given 05/30/24 0907)    Clinical Course as of 05/30/24 1436  Wed May 30, 2024  1407 Cefpodoxime and flagyl   [TL]    Clinical Course User Index [TL] Simon Lavonia SAILOR, MD                                 Medical Decision Making Amount and/or Complexity of Data Reviewed Labs: ordered. Radiology: ordered.  Risk Prescription drug management.     HPI:     Patient presents with multiple complaints.  Endorsing swelling of the throat.  She states has been swelling over the past couple of days.  She states that she has had abscess in the past that had to be drained.  Endorses pain with swallowing.  Been able to tolerate liquids.  No difficulty breathing.  Tolerating her secretions.  Denies any obvious fever or chills.  Denies any chest pain or shortness of breath.  Endorses some episodes of nausea as well as lower abdominal pain.  Diarrhea 2 days ago.  No hematemesis or hematochezia.  Patient also  states that she has been having some bumps around her labia.   No abnormal pelvic discharge.   Previous medical history reviewed : medical history to include cocaine use disorder, asthma, lower extremity edema, BNP, acute on chronic CHF, hypertension, lower extremity edema, pulmonary edema.  Last seen in the ED on November 22.  Shortness of breath.   MDM:    Upon exam, patient hemodynamic stable.  ANO x 3 with GCS 15.   Lung sounds clear to ulceration bilaterally.  No stridor.  No rales or  rhonchi  In terms of oropharynx exam, patient did have enlarged tonsils specifically on the right side with some exudates.  Some swelling to the right side the neck.  Will obtain CT neck with contrast assessment and count of significant infectious process at this time such as abscess.  Lower abdominal pain.  Left lower quadrant tenderness.  No rebound or guarding.  Diarrhea.  Eval for diverticulitis with CT scan  Performed pelvic eval with RN Chelsea Schilling: no evidence of lesions. No discharge. Post menapause so question if she is having some irritation due to some dryness. No recent sexual partners. Recommend follow up with gynecology.   Reevaluation:   Upon reexamination, patient hemodynamically stable.  Remains A&O x 3 with GCS 15.  CT scan did show peritonsillar abscess.  Subsequent consult ENT Dr. Tobie.  Dr. Tobie drained the abscess and recommended discharge with Augmentin  twice daily for 14 days.  Can follow-up outpatient.  Patient also has what looks like a UTI.  On her CT scan of the abdomen, questionable inflammation along the distal ureter.  Unclear whether or not patient recently just passed a stone.  Did recommend follow-up with urology given smoking history to rule out an [facility] cancer pathology.  Discussed with the patient at length and included in patient's discharge paperwork.  Was also started on cefpodoxime to cover for any kind of a standing infection.  Unfortunately did have to  give both Augmentin  as well as cefpodoxime given that there was no appropriate single oral agent to cover for both peritonsillar abscess and this possible a setting infection.  She is hemodynamically stable..  Initially slightly tachycardic but upon reexamination, heart rate consistently in the 80s and 90s.  Maps appropriate  She is appropriate for discharge home and follow-up.  Tolerate p.o. both solids and liquids here.    I have independently interpreted the CT  images and agree with the radiologist finding   Social Determinant of Health: History of polysubstance abuse    Disposition and Follow Up: ENT, Urology       Final diagnoses:  Peritonsillar abscess  UTI (urinary tract infection), bacterial    ED Discharge Orders          Ordered    amoxicillin -clavulanate (AUGMENTIN ) 875-125 MG tablet  Every 12 hours        05/30/24 1430    methylPREDNISolone  (MEDROL  DOSEPAK) 4 MG TBPK tablet        05/30/24 1430    cefpodoxime (VANTIN) 200 MG tablet  2 times daily        05/30/24 1430               Simon Lavonia SAILOR, MD 05/30/24 1436

## 2024-05-30 NOTE — Discharge Instructions (Addendum)
 1. Mild distal left ureteral fullness and edema around the distal left ureter. Very subtle punctate hyperdensity in the posterior left bladder along the mucosal surface is potentially a punctate stone that has passed into the bladder lumen. Urinary tract infection would be a consideration. Given the history of smoking and some potential soft tissue fullness in the UVJ region, mucosal neoplasm must be excluded. Urology consultation recommended. Follow-up hematuria protocol CT may prove helpful.   The CT above mentions some thickening with party of your urinary system. Given your smoking history, it is very important that you follow up with urology to rule out cancer. Please give them a call to establish care.   Given the findings on your urine as well as CT scan, we will start you on cefpodoxime.  Take as prescribed.  Given your abscess.  We will start you on antibiotics as well as Medrol  Dosepak.  Please take as prescribed.  Please follow-up with both urology as well as ENT as we discussed.

## 2024-05-30 NOTE — Procedures (Signed)
 Procedure Note Pre-procedure diagnosis: Concern for Right Peritonsillar Abscess Post-procedure diagnosis: Same Procedure: Incision and Drainage of Right Peritonsillar Abscess, CPT 42700 Complications: None apparent EBL: ~5 mL Date: 05/30/2024  Indication: Julie [name] is a 48 y.o.  female with concern for right peritonsillar abscess. Decision was made for incision and drainage of abscess following discussion of risks/benefits of procedure.  The patient was identified as the correct patient.  The patient is able to give consent, therefore verbal consent was obtained. Surrounding mucosa was anesthetized with Lidocaine  spray. Anterior tonsillar pillar was injected with 1% Lidocaine  with 1:100,000 epinephrine .  A 18 G needle was used to aspirate the right peritonsillar area, with return of purulence, which was cultured. Then, a 2 cm stab incision was made in the anterior tonsillar pillar. Hemostat was used to dissect into abscess pocket. Purulence was encountered.  Loculation were broken up with hemostat. Wound was noted to be hemostatic.  Patient tolerated this procedure well and there were no immediately apparent complication.  Electronically signed by: Eldora KATHEE Blanch, MD 05/30/2024 2:37 PM

## 2024-05-30 NOTE — ED Triage Notes (Signed)
 Patient here from home BIB [facility] w/ swelling in the back of the throat, pain and redness noted. Reports lower abdominal pain, bumps/ rash noted to vaginal area that burn and itch. Does report shortness of breath,is able to swallow. This started 3 days ago. Concerned about herpes infection.

## 2024-05-30 NOTE — Telephone Encounter (Signed)
 Called number on file for patient which is her sister, I let her know to have patient call us  back to schedule a 3 week follow up with Chyr[facility] in 3 weeks for PTA.

## 2024-05-30 NOTE — Consult Note (Signed)
 ENT CONSULT:  Reason for Consult: Right peritonsillar abscess  Referring Physician:  ED Team  HPI: Julie [name] is an 48 y.o. female w h/o DM who presents with 3 days of sore throat and pain. Difficulty tolerating solids, but drinking liquids. No problem with breathing or tolerating secretions. Some malaise. Did have prior PTA drained by Dr. Roark in Jan 2025, this feels similar. Abx naive. Denies fevers, sick contacts.   Past Medical History:  Diagnosis Date   Asthma    Diabetes mellitus without complication (HCC)    Fracture, humerus closed, shaft 07/11/2014   left   History of bronchitis    Mental health problem    Open fracture of great toe of left foot 12/25/2015    Past Surgical History:  Procedure Laterality Date   CESAREAN SECTION     ECTOPIC PREGNANCY SURGERY  2003   INCISION AND DRAINAGE Left 12/25/2015   Procedure: INCISION AND DRAINAGE With  Amputation of distal tip of Left Great and Second Toe.;  Surgeon: Kay CHRISTELLA Cummins, MD;  Location: [facility];  Service: Orthopedics;  Laterality: Left;   LIPOMA EXCISION     chest   ORIF HUMERUS FRACTURE Left 07/15/2014   Procedure: OPEN REDUCTION INTERNAL FIXATION (ORIF) LEFT HUMERAL SHAFT ;  Surgeon: Evalene JONETTA Chancy, MD;  Location: [facility];  Service: Orthopedics;  Laterality: Left;   TUBAL LIGATION  2003    History reviewed. No pertinent family history.  Social History:  reports that she has been smoking cigarettes. She has a 9 pack-year smoking history. She has never used smokeless tobacco. She reports current alcohol  use. She reports current drug use. Drugs: Marijuana, Crack cocaine, and MDMA (Ecstacy).  Allergies: No Known Allergies  Medications: I have reviewed the patient's current medications.  Results for orders placed or performed during the hospital encounter of 05/30/24 (from the past 48 hours)  Group A Strep by PCR if patient complains of sore throat.     Status: None   Collection Time: 05/30/24   3:24 AM   Specimen: Throat; Sterile Swab  Result Value Ref Range   Group A Strep by PCR NOT DETECTED NOT DETECTED    Comment: Performed at [facility], [address], [place], [place] [address]  Lipase, blood     Status: None   Collection Time: 05/30/24  3:31 AM  Result Value Ref Range   Lipase 30 11 - 51 U/L    Comment: Performed at [facility], [address], [place], [place] [address]  Comprehensive metabolic panel     Status: Abnormal   Collection Time: 05/30/24  3:31 AM  Result Value Ref Range   Sodium 139 135 - 145 mmol/L   Potassium 3.9 3.5 - 5.1 mmol/L   Chloride 105 98 - 111 mmol/L   CO2 27 22 - 32 mmol/L   Glucose, Bld 347 (H) 70 - 99 mg/dL    Comment: Glucose reference range applies only to samples taken after fasting for at least 8 hours.   BUN 5 (L) 6 - 20 mg/dL   Creatinine, Ser 9.08 0.44 - 1.00 mg/dL   Calcium  10.0 8.9 - 10.3 mg/dL   Total Protein 7.3 6.5 - 8.1 g/dL   Albumin 3.4 (L) 3.5 - 5.0 g/dL   AST 19 15 - 41 U/L   ALT 13 0 - 44 U/L   Alkaline Phosphatase 66 38 - 126 U/L   Total Bilirubin 0.5 0.0 - 1.2 mg/dL  GFR, Estimated >60 >60 mL/min    Comment: (NOTE) Calculated using the CKD-EPI Creatinine Equation (2021)    Anion gap 7 5 - 15    Comment: Performed at [facility], [address] [place], [place] [address]  CBC     Status: Abnormal   Collection Time: 05/30/24  3:31 AM  Result Value Ref Range   WBC 7.3 4.0 - 10.5 K/uL   RBC 5.48 (H) 3.87 - 5.11 MIL/uL   Hemoglobin 14.6 12.0 - 15.0 g/dL   HCT 52.8 (H) 63.9 - 53.9 %   MCV 85.9 80.0 - 100.0 fL   MCH 26.6 26.0 - 34.0 pg   MCHC 31.0 30.0 - 36.0 g/dL   RDW 85.9 88.4 - 84.4 %   Platelets 224 150 - 400 K/uL   nRBC 0.0 0.0 - 0.2 %    Comment: Performed at [facility], [address], [place], [place] [address]  hCG, serum, qualitative     Status: None   Collection Time: 05/30/24  3:31 AM  Result Value Ref Range   Preg, Serum NEGATIVE NEGATIVE    Comment:        THE  SENSITIVITY OF THIS METHODOLOGY IS >10 mIU/mL. Performed at [facility], [address] [place], [place] [address]   TSH     Status: None   Collection Time: 05/30/24  3:31 AM  Result Value Ref Range   TSH 3.976 0.350 - 4.500 uIU/mL    Comment: Performed by a 3rd Generation assay with a functional sensitivity of <=0.01 uIU/mL. Performed at [facility], [address], [place], [place] [address]   Urinalysis, Routine w reflex microscopic -Urine, Clean Catch     Status: Abnormal   Collection Time: 05/30/24 10:20 AM  Result Value Ref Range   Color, Urine YELLOW YELLOW   APPearance HAZY (A) CLEAR   Specific Gravity, Urine 1.010 1.005 - 1.030   pH 6.0 5.0 - 8.0   Glucose, UA >=500 (A) NEGATIVE mg/dL   Hgb urine dipstick TRACE (A) NEGATIVE   Bilirubin Urine NEGATIVE NEGATIVE   Ketones, ur NEGATIVE NEGATIVE mg/dL   Protein, ur NEGATIVE NEGATIVE mg/dL   Nitrite POSITIVE (A) NEGATIVE   Leukocytes,Ua TRACE (A) NEGATIVE    Comment: Performed at [facility], [address] [place], [place] [address]  Urinalysis, Microscopic (reflex)     Status: Abnormal   Collection Time: 05/30/24 10:20 AM  Result Value Ref Range   RBC / HPF 0-5 0 - 5 RBC/hpf   WBC, UA 11-20 0 - 5 WBC/hpf   Bacteria, UA MANY (A) NONE SEEN   Squamous Epithelial / HPF 0-5 0 - 5 /HPF   WBC Clumps PRESENT     Comment: Performed at [facility], [address], [place], [place] [address]    CT ABDOMEN PELVIS W CONTRAST Result Date: 05/30/2024 CLINICAL DATA:  Lower abdominal pain with nausea and diarrhea. Evaluate for diverticulitis. EXAM: CT ABDOMEN AND PELVIS WITH CONTRAST TECHNIQUE: Multidetector CT imaging of the abdomen and pelvis was performed using the standard protocol following bolus administration of intravenous contrast. RADIATION DOSE REDUCTION: This exam was performed according to the departmental dose-optimization program which includes automated exposure control, adjustment of the mA  and/or kV according to patient size and/or use of iterative reconstruction technique. CONTRAST:  OMNIPAQUE  IOHEXOL  350 MG/ML SOLN COMPARISON:  04/17/2024 FINDINGS: Lower chest: Dependent atelectasis. Hepatobiliary: No suspicious focal abnormality within the liver  parenchyma. Gallbladder is nondistended. No intrahepatic or extrahepatic biliary dilation. Pancreas: No focal mass lesion. No dilatation of the main duct. No intraparenchymal cyst. No peripancreatic edema. Spleen: No splenomegaly. No suspicious focal mass lesion. Adrenals/Urinary Tract: Left adrenal nodules are stable comparing back to a CT from 10/31/2021 and the more cranial of the 2 nodules has low density consistent with adenoma. 2 cm right adrenal nodule also stable since the 2023 exam. Interval stability and CT appearance is most compatible with benign etiology such as adenomas. 3.0 cm simple appearing cyst in the upper pole left kidney is also stable since the 2023 exam. No suspicious enhancing renal mass lesion. No evidence for right hydroureter. There is some distal left ureteral fullness and edema around the distal left ureter (a see images 70-79 of series 3. Next renally subtle punctate focus of increased attenuation is seen along the mucosal surface of the left posterior bladder wall on axial 90/3 and perhaps better demonstrated on coronal 45/6. This is in close proximity to the UVJ but appears to be just cranial to it. Potentially a punctate stone that has passed into the bladder lumen, given the history of smoking, mucosal neoplasm in the region of the UVJ is a possibility. Stomach/Bowel: Stomach is unremarkable. No gastric wall thickening. No evidence of outlet obstruction. Duodenum is normally positioned as is the ligament of Treitz. No small bowel wall thickening. No small bowel dilatation. The terminal ileum is normal. The appendix is normal. No gross colonic mass. No colonic wall thickening. Diverticular changes are noted in the  left colon without evidence of diverticulitis. Vascular/Lymphatic: No abdominal aortic aneurysm. No abdominal lymphadenopathy No pelvic sidewall lymphadenopathy. Reproductive: Uterine fibroids evident.  There is no adnexal mass. Other: No intraperitoneal free fluid. Musculoskeletal: No worrisome lytic or sclerotic osseous abnormality. IMPRESSION: 1. Mild distal left ureteral fullness and edema around the distal left ureter. Very subtle punctate hyperdensity in the posterior left bladder along the mucosal surface is potentially a punctate stone that has passed into the bladder lumen. Urinary tract infection would be a consideration. Given the history of smoking and some potential soft tissue fullness in the UVJ region, mucosal neoplasm must be excluded. Urology consultation recommended. Follow-up hematuria protocol CT may prove helpful. 2. Left colonic diverticulosis without diverticulitis. 3. Uterine fibroids. 4. Stable bilateral adrenal nodules comparing back to a CT from 10/31/2021. Interval stability and CT appearance is most compatible with benign etiology such as adenomas. Electronically Signed   By: Camellia Candle M.D.   On: 05/30/2024 09:58   CT Soft Tissue Neck W Contrast Result Date: 05/30/2024 EXAM: CT NECK WITH CONTRAST 05/30/2024 09:15:18 AM TECHNIQUE: CT of the neck was performed with the administration of 100 mL of iohexol  (OMNIPAQUE ) 350 MG/ML injection. Multiplanar reformatted images are provided for review. Automated exposure control, iterative reconstruction, and/or weight based adjustment of the mA/kV was utilized to reduce the radiation dose to as low as reasonably achievable. COMPARISON: CT neck 06/28/2023. CLINICAL HISTORY: History of peritonsillar abscess. Increasing neck swelling. FINDINGS: AERODIGESTIVE TRACT: There is a 2.0 x 1.4 x 1.6 cm peripherally enhancing collection adjacent to the right palatine tonsil compatible with a peritonsillar abscess. There is associated enlargement of the  palatine tonsils, more pronounced on the right. Edema of the oropharyngeal mucosa noted. There is additional enhancement along the nasopharyngeal and oropharyngeal mucosa. Mild narrowing of the oropharyngeal airway. The oral cavity and floor of mouth are unremarkable. Right aspect of the enlarged right palatine tonsil abuts the lateral aspect of  the epiglottis. There is no edema of the epiglottis. The basilar aspect of the enlarged right palatine tonsil is unremarkable. Normal appearance of the retropharynx. Normal appearance of the supraglottic airway. Symmetric appearance of the vocal folds. Dental caries and multiple periapical lucencies, particularly involving multiple left sided maxillary molars. SALIVARY GLANDS: The parotid and submandibular glands are unremarkable. THYROID : Unremarkable. LYMPH NODES: Enlarged right level 2a cervical nodes measuring at least 1.0 cm in short axis. There are additional subcentimeter right level 1b and right level 3 nodes. SOFT TISSUES: There is a cystic focus in the subcutaneous tissues of the inferior neck left of midline likely reflecting an epidermal inclusion cyst. BRAIN, ORBITS, SINUSES AND MASTOIDS: Atherosclerosis noted within the carotid siphons. Mucosal thickening in the bilateral maxillary sinuses, left greater than right. No acute abnormality of the brain, orbits, or mastoids. LUNGS AND MEDIASTINUM: No acute abnormality. BONES: No focal bone abnormality. IMPRESSION: 1. 2.0 x 1.4 x 1.6 cm collection adjacent to the right palatine tonsil compatible with a peritonsillar abscess. 2. Associated enlargement of the palatine tonsils (more pronounced on the right) and edema of the oropharyngeal mucosa. 3. Enlarged right level 2a cervical nodes measuring at least 1.0 cm in short axis, likely reactive. Electronically signed by: Donnice Mania MD 05/30/2024 09:44 AM EST RP Workstation: [id number]   ROS: as above  Blood pressure (!) 173/86, pulse 93, temperature 98.1 F (36.7  C), temperature source Oral, resp. rate 17, height 5' 10 (1.778 m), weight 136 kg, SpO2 94%.  PHYSICAL EXAM:  CONSTITUTIONAL: well developed, nourished alert and oriented x 3 CARDIOVASCULAR: normal rate and regular rhythm PULMONARY/CHEST WALL: effort normal and no stridor, no stertor, no dysphonia HENT: Head : normocephalic and atraumatic Nose: nose normal and no purulence Mouth/Throat:  Mouth: uvula midline and no oral lesions Throat: oropharynx clear but mild trismus; clear right tonsillar pillar fullness and some erythema; prior PTA drainage scar Mucous membranes: normal EYES: conjunctiva normal, EOM normal and PERRL NECK: supple, trachea normal and no thyromegaly or cervical LAD Obese neck, easily lays flat without distress  Studies Reviewed: CT Neck 05/30/2024 independently interpreted: agree with read - airway overall patent, edema extends down to epiglottis on right 1. 2.0 x 1.4 x 1.6 cm collection adjacent to the right palatine tonsil compatible with a peritonsillar abscess. 2. Associated enlargement of the palatine tonsils (more pronounced on the right) and edema of the oropharyngeal mucosa. 3. Enlarged right level 2a cervical nodes measuring at least 1.0 cm in short axis, likely reactive.  CBC 05/30/2024: WBC 7.3  Assessment/Plan  48 y.o. with:  Recurrent right peritonsillar abscess - Drained in ED - Will f/u Cx - Recommend Augmentin  BID x14d and medrol  dose pack - Return precautions discussed - f/u ENT will request in about 2-3 weeks  Anida Deol B Crews Mccollam   05/30/2024, 1:25 PM   MDM:  mod - independent CT interpretation; labs reviewed; acute problem with systemic sx; prescription meds management/recommendation

## 2024-06-01 LAB — AEROBIC CULTURE W GRAM STAIN (SUPERFICIAL SPECIMEN): Culture: NORMAL

## 2024-06-02 ENCOUNTER — Telehealth (HOSPITAL_BASED_OUTPATIENT_CLINIC_OR_DEPARTMENT_OTHER): Payer: Self-pay | Admitting: *Deleted

## 2024-06-02 NOTE — Telephone Encounter (Signed)
 Post ED Visit - Positive Culture Follow-up  Culture report reviewed by antimicrobial stewardship pharmacist: [facility] []  Rankin Dee, Pharm.D. []  Venetia Gully, Pharm.D., BCPS AQ-ID []  Garrel Crews, Pharm.D., BCPS []  Almarie Lunger, Pharm.D., BCPS []  [place], [place].D., BCPS, AAHIVP []  Rosaline Bihari, Pharm.D., BCPS, AAHIVP []  Vernell Meier, PharmD, BCPS []  Latanya Hint, PharmD, BCPS []  Donald Medley, PharmD, BCPS []  Rocky Bold, PharmD []  Dorothyann Alert, PharmD, BCPS [x]  Dorn Poot,  PharmD  [facility] []  Rosaline Edison, PharmD []  Romona Bliss, PharmD []  Dolphus Roller, PharmD []  Veva Seip, Rph []  Vernell Daunt) Leonce, PharmD []  Eva Allis, PharmD []  Rosaline Millet, PharmD []  Iantha Batch, PharmD []  Arvin Gauss, PharmD []  Wanda Hasting, PharmD []  Ronal Rav, PharmD []  Rocky Slade, PharmD []  Bard Jeans, PharmD   Positive aerobic culture Treated with Amoxicillin -Pot Clavulanate and Cefpodoxime  Proxetil,  and no further patient follow-up is required at this time.  Albino Alan Novak 06/02/2024, 9:47 AM

## 2024-06-17 ENCOUNTER — Emergency Department (HOSPITAL_COMMUNITY): Payer: MEDICAID

## 2024-06-17 ENCOUNTER — Observation Stay (HOSPITAL_COMMUNITY)
Admission: EM | Admit: 2024-06-17 | Discharge: 2024-06-18 | Disposition: A | Payer: MEDICAID | Attending: Family Medicine | Admitting: Family Medicine

## 2024-06-17 ENCOUNTER — Other Ambulatory Visit: Payer: Self-pay

## 2024-06-17 ENCOUNTER — Encounter (HOSPITAL_COMMUNITY): Payer: Self-pay

## 2024-06-17 DIAGNOSIS — I5032 Chronic diastolic (congestive) heart failure: Secondary | ICD-10-CM | POA: Diagnosis not present

## 2024-06-17 DIAGNOSIS — J029 Acute pharyngitis, unspecified: Secondary | ICD-10-CM | POA: Diagnosis present

## 2024-06-17 DIAGNOSIS — I110 Hypertensive heart disease with heart failure: Secondary | ICD-10-CM | POA: Insufficient documentation

## 2024-06-17 DIAGNOSIS — E119 Type 2 diabetes mellitus without complications: Secondary | ICD-10-CM | POA: Insufficient documentation

## 2024-06-17 DIAGNOSIS — F19920 Other psychoactive substance use, unspecified with intoxication, uncomplicated: Secondary | ICD-10-CM | POA: Diagnosis not present

## 2024-06-17 DIAGNOSIS — F329 Major depressive disorder, single episode, unspecified: Secondary | ICD-10-CM | POA: Insufficient documentation

## 2024-06-17 DIAGNOSIS — R109 Unspecified abdominal pain: Secondary | ICD-10-CM | POA: Diagnosis not present

## 2024-06-17 DIAGNOSIS — F321 Major depressive disorder, single episode, moderate: Secondary | ICD-10-CM | POA: Insufficient documentation

## 2024-06-17 DIAGNOSIS — J36 Peritonsillar abscess: Secondary | ICD-10-CM | POA: Diagnosis not present

## 2024-06-17 DIAGNOSIS — F1990 Other psychoactive substance use, unspecified, uncomplicated: Secondary | ICD-10-CM | POA: Insufficient documentation

## 2024-06-17 DIAGNOSIS — J45909 Unspecified asthma, uncomplicated: Secondary | ICD-10-CM | POA: Insufficient documentation

## 2024-06-17 DIAGNOSIS — Z789 Other specified health status: Secondary | ICD-10-CM

## 2024-06-17 LAB — COMPREHENSIVE METABOLIC PANEL WITH GFR
ALT: 11 U/L (ref 0–44)
AST: 19 U/L (ref 15–41)
Albumin: 3.8 g/dL (ref 3.5–5.0)
Alkaline Phosphatase: 69 U/L (ref 38–126)
Anion gap: 10 (ref 5–15)
BUN: 7 mg/dL (ref 6–20)
CO2: 24 mmol/L (ref 22–32)
Calcium: 10 mg/dL (ref 8.9–10.3)
Chloride: 104 mmol/L (ref 98–111)
Creatinine, Ser: 0.75 mg/dL (ref 0.44–1.00)
GFR, Estimated: >60 mL/min
Glucose, Bld: 298 mg/dL — ABNORMAL HIGH (ref 70–99)
Potassium: 3.7 mmol/L (ref 3.5–5.1)
Sodium: 138 mmol/L (ref 135–145)
Total Bilirubin: 0.3 mg/dL (ref 0.0–1.2)
Total Protein: 6.8 g/dL (ref 6.5–8.1)

## 2024-06-17 LAB — CBC WITH DIFFERENTIAL/PLATELET
Abs Immature Granulocytes: 0.02 K/uL (ref 0.00–0.07)
Basophils Absolute: 0 K/uL (ref 0.0–0.1)
Basophils Relative: 1 %
Eosinophils Absolute: 0.1 K/uL (ref 0.0–0.5)
Eosinophils Relative: 1 %
HCT: 43.2 % (ref 36.0–46.0)
Hemoglobin: 13.6 g/dL (ref 12.0–15.0)
Immature Granulocytes: 0 %
Lymphocytes Relative: 25 %
Lymphs Abs: 2.2 K/uL (ref 0.7–4.0)
MCH: 27.1 pg (ref 26.0–34.0)
MCHC: 31.5 g/dL (ref 30.0–36.0)
MCV: 86.2 fL (ref 80.0–100.0)
Monocytes Absolute: 0.7 K/uL (ref 0.1–1.0)
Monocytes Relative: 8 %
Neutro Abs: 5.5 K/uL (ref 1.7–7.7)
Neutrophils Relative %: 65 %
Platelets: 210 K/uL (ref 150–400)
RBC: 5.01 MIL/uL (ref 3.87–5.11)
RDW: 14.1 % (ref 11.5–15.5)
WBC: 8.5 K/uL (ref 4.0–10.5)
nRBC: 0 % (ref 0.0–0.2)

## 2024-06-17 LAB — LIPID PANEL
Cholesterol: 193 mg/dL (ref 0–200)
HDL: 50 mg/dL
LDL Cholesterol: 124 mg/dL — ABNORMAL HIGH (ref 0–99)
Total CHOL/HDL Ratio: 3.8 ratio
Triglycerides: 92 mg/dL
VLDL: 18 mg/dL (ref 0–40)

## 2024-06-17 LAB — TROPONIN T, HIGH SENSITIVITY
Troponin T High Sensitivity: 23 ng/L — ABNORMAL HIGH (ref 0–19)
Troponin T High Sensitivity: 25 ng/L — ABNORMAL HIGH (ref 0–19)

## 2024-06-17 LAB — CBG MONITORING, ED
Glucose-Capillary: 249 mg/dL — ABNORMAL HIGH (ref 70–99)
Glucose-Capillary: 305 mg/dL — ABNORMAL HIGH (ref 70–99)
Glucose-Capillary: 368 mg/dL — ABNORMAL HIGH (ref 70–99)
Glucose-Capillary: 409 mg/dL — ABNORMAL HIGH (ref 70–99)
Glucose-Capillary: 434 mg/dL — ABNORMAL HIGH (ref 70–99)

## 2024-06-17 LAB — RESP PANEL BY RT-PCR (RSV, FLU A&B, COVID)  RVPGX2
Influenza A by PCR: NEGATIVE
Influenza B by PCR: NEGATIVE
Resp Syncytial Virus by PCR: NEGATIVE
SARS Coronavirus 2 by RT PCR: NEGATIVE

## 2024-06-17 LAB — GROUP A STREP BY PCR: Group A Strep by PCR: NOT DETECTED

## 2024-06-17 MED ORDER — INSULIN ASPART 100 UNIT/ML IJ SOLN
0.0000 [IU] | Freq: Three times a day (TID) | INTRAMUSCULAR | Status: DC
  Administered 2024-06-17 (×2): 20 [IU] via SUBCUTANEOUS
  Administered 2024-06-18: 7 [IU] via SUBCUTANEOUS
  Administered 2024-06-18: 11 [IU] via SUBCUTANEOUS
  Filled 2024-06-17 (×2): qty 20

## 2024-06-17 MED ORDER — AMLODIPINE BESYLATE 5 MG PO TABS
5.0000 mg | ORAL_TABLET | Freq: Once | ORAL | Status: AC
  Administered 2024-06-17: 5 mg via ORAL
  Filled 2024-06-17: qty 1

## 2024-06-17 MED ORDER — SODIUM CHLORIDE 0.9 % IV SOLN
3.0000 g | Freq: Four times a day (QID) | INTRAVENOUS | Status: DC
  Administered 2024-06-17 – 2024-06-18 (×4): 3 g via INTRAVENOUS
  Filled 2024-06-17 (×3): qty 8

## 2024-06-17 MED ORDER — ALBUTEROL SULFATE (2.5 MG/3ML) 0.083% IN NEBU: 2.5000 mg | INHALATION_SOLUTION | Freq: Four times a day (QID) | RESPIRATORY_TRACT | Status: DC | PRN

## 2024-06-17 MED ORDER — PANTOPRAZOLE SODIUM 40 MG PO TBEC
40.0000 mg | DELAYED_RELEASE_TABLET | Freq: Every day | ORAL | Status: DC
  Administered 2024-06-17 – 2024-06-18 (×2): 40 mg via ORAL
  Filled 2024-06-17: qty 1

## 2024-06-17 MED ORDER — IBUPROFEN 600 MG PO TABS
600.0000 mg | ORAL_TABLET | Freq: Four times a day (QID) | ORAL | Status: DC
  Administered 2024-06-17 – 2024-06-18 (×5): 600 mg via ORAL
  Filled 2024-06-17 (×3): qty 1

## 2024-06-17 MED ORDER — IPRATROPIUM-ALBUTEROL 0.5-2.5 (3) MG/3ML IN SOLN
3.0000 mL | RESPIRATORY_TRACT | Status: DC | PRN
  Administered 2024-06-17: 3 mL via RESPIRATORY_TRACT
  Filled 2024-06-17: qty 3

## 2024-06-17 MED ORDER — ENOXAPARIN SODIUM 40 MG/0.4ML IJ SOSY
40.0000 mg | PREFILLED_SYRINGE | INTRAMUSCULAR | Status: DC
  Administered 2024-06-17: 40 mg via SUBCUTANEOUS
  Filled 2024-06-17: qty 0.4

## 2024-06-17 MED ORDER — UMECLIDINIUM-VILANTEROL 62.5-25 MCG/ACT IN AEPB
1.0000 | INHALATION_SPRAY | Freq: Every day | RESPIRATORY_TRACT | Status: DC
  Filled 2024-06-17: qty 14

## 2024-06-17 MED ORDER — ACETAMINOPHEN 500 MG PO TABS
1000.0000 mg | ORAL_TABLET | Freq: Once | ORAL | Status: DC
  Filled 2024-06-17: qty 2

## 2024-06-17 MED ORDER — SERTRALINE HCL 50 MG PO TABS
50.0000 mg | ORAL_TABLET | Freq: Every day | ORAL | Status: DC
  Administered 2024-06-17 – 2024-06-18 (×2): 50 mg via ORAL
  Filled 2024-06-17: qty 1

## 2024-06-17 MED ORDER — FUROSEMIDE 20 MG PO TABS
20.0000 mg | ORAL_TABLET | Freq: Every day | ORAL | Status: DC
  Administered 2024-06-17 – 2024-06-18 (×2): 20 mg via ORAL
  Filled 2024-06-17: qty 1

## 2024-06-17 MED ORDER — FENTANYL CITRATE (PF) 50 MCG/ML IJ SOSY
50.0000 ug | PREFILLED_SYRINGE | Freq: Once | INTRAMUSCULAR | Status: AC
  Administered 2024-06-17: 50 ug via INTRAVENOUS
  Filled 2024-06-17: qty 1

## 2024-06-17 MED ORDER — IOHEXOL 350 MG/ML SOLN
75.0000 mL | Freq: Once | INTRAVENOUS | Status: AC | PRN
  Administered 2024-06-17: 75 mL via INTRAVENOUS

## 2024-06-17 MED ORDER — LOSARTAN POTASSIUM 50 MG PO TABS
50.0000 mg | ORAL_TABLET | Freq: Every day | ORAL | Status: DC
  Administered 2024-06-17 – 2024-06-18 (×2): 50 mg via ORAL
  Filled 2024-06-17: qty 1

## 2024-06-17 MED ORDER — OXYCODONE HCL 5 MG PO TABS: 5.0000 mg | ORAL_TABLET | Freq: Four times a day (QID) | ORAL | Status: DC | PRN

## 2024-06-17 MED ORDER — DEXAMETHASONE SOD PHOSPHATE PF 10 MG/ML IJ SOLN
8.0000 mg | Freq: Once | INTRAMUSCULAR | Status: AC
  Administered 2024-06-17: 8 mg via INTRAVENOUS

## 2024-06-17 MED ORDER — DEXAMETHASONE SOD PHOSPHATE PF 10 MG/ML IJ SOLN: 8.0000 mg | Freq: Once | INTRAMUSCULAR | Status: DC

## 2024-06-17 MED ORDER — INSULIN GLARGINE 100 UNIT/ML ~~LOC~~ SOLN
10.0000 [IU] | Freq: Every day | SUBCUTANEOUS | Status: DC
  Administered 2024-06-17 – 2024-06-18 (×2): 10 [IU] via SUBCUTANEOUS
  Filled 2024-06-17 (×2): qty 0.1

## 2024-06-17 MED ORDER — AMLODIPINE BESYLATE 5 MG PO TABS
5.0000 mg | ORAL_TABLET | Freq: Every day | ORAL | Status: DC
  Administered 2024-06-17: 5 mg via ORAL
  Filled 2024-06-17: qty 1

## 2024-06-17 MED ORDER — ACETAMINOPHEN 500 MG PO TABS
1000.0000 mg | ORAL_TABLET | Freq: Four times a day (QID) | ORAL | Status: DC
  Administered 2024-06-17 – 2024-06-18 (×5): 1000 mg via ORAL
  Filled 2024-06-17 (×3): qty 2

## 2024-06-17 MED ORDER — AMLODIPINE BESYLATE 5 MG PO TABS
10.0000 mg | ORAL_TABLET | Freq: Every day | ORAL | Status: DC
  Administered 2024-06-18: 10 mg via ORAL

## 2024-06-17 NOTE — ED Triage Notes (Addendum)
 Patient brought in by EMS with difficulty swallowing, sore throat and sores in mouth and throat.  Patient is a diabetic. Patient had abscess drained from throat and appears it has come back.  Patient having difficulty handling secretions.

## 2024-06-17 NOTE — ED Notes (Signed)
 Pt refusing blood pressure cuff until time vitals are due.

## 2024-06-17 NOTE — Assessment & Plan Note (Addendum)
 Recent A1c 9.2 in 02/2024. On metformin  at home. CBGs in ED 400s despite SSI. - Start lantus  10 units daily - Resistant sliding scale insulin  given steroid administration and elevated BMI - Repeat A1c, lipid panel

## 2024-06-17 NOTE — Discharge Instructions (Addendum)
 Dear Julie [name] Alert,   Thank you for letting us  participate in your care! You were admitted for a peritonsillar abscess which was drained by ENT. You are being discharged with antibiotics. We also increased your blood pressure regimen since your blood pressure was high. Consider discussing your diabetes control with your PCP as your blood glucose was elevated   POST-HOSPITAL & CARE INSTRUCTIONS Take Augmentin  1 tablet twice a day for the next 14 days. This is an antibiotic to treat the infection.  We recommend following up with your PCP within 1 week from being discharged from the hospital. Our social workers have provided resources. Please follow up with the ENT as scheduled.   DOCTOR'S APPOINTMENTS & FOLLOW UP No future appointments.  Thank you for choosing [facility]! Take care and be well!  Family Medicine Teaching Service Inpatient Team [place]  [facility]  [address] [place], [place] [address] [phone]

## 2024-06-17 NOTE — Assessment & Plan Note (Addendum)
 Patient with continued cigarette, cocaine, and marijuana use. - UDS ordered - TOC consult placed for substance use and assistance with getting patient a PCP

## 2024-06-17 NOTE — ED Provider Notes (Signed)
 " [place] [facility] Provider Note   CSN: [id number] Arrival date & time: 06/17/24  9355     Patient presents with: Sore Throat and Dysphagia   Julie [name] is a 48 y.o. female with PMHx asthma, CHF, HTN, DM who presents to ED concerned for peritonsillar abscess. Patient was diagnosed with a PTA 18 days ago which was drained in ED by ENT. Patient states that she thinks that she took all of her ABX - but is not certain. Patient stating that throat swelling was resolved up until yesterday. Patient stating that swelling has been quickly getting worse over the past 24 hours. Is having troubles tolerating oral secretions. No SOB.    Sore Throat       Prior to Admission medications  Medication Sig Start Date End Date Taking? Authorizing Provider  albuterol  (VENTOLIN  HFA) 108 (90 Base) MCG/ACT inhaler Inhale 1-2 puffs into the lungs every 6 (six) hours as needed for wheezing or shortness of breath. 05/12/24   Ruthell Lonni FALCON, PA-C  amLODipine  (NORVASC ) 5 MG tablet Take 1 tablet (5 mg total) by mouth daily. 04/30/24   Midge Golas, MD  furosemide  (LASIX ) 20 MG tablet Take 1 tablet (20 mg total) by mouth daily. 04/30/24   Midge Golas, MD  losartan  (COZAAR ) 50 MG tablet Take 1 tablet (50 mg total) by mouth daily. 04/30/24   Midge Golas, MD  metFORMIN  (GLUCOPHAGE ) 500 MG tablet Take 1 tablet (500 mg total) by mouth 2 (two) times daily with a meal. 04/30/24   Midge Golas, MD  methylPREDNISolone  (MEDROL  DOSEPAK) 4 MG TBPK tablet Take as directed on package 05/30/24   Simon Lavonia SAILOR, MD  risperiDONE  (RISPERDAL ) 0.5 MG tablet Take 1 tablet (0.5 mg total) by mouth at bedtime. 04/30/24   Midge Golas, MD  sertraline  (ZOLOFT ) 50 MG tablet Take 1 tablet (50 mg total) by mouth daily. 04/30/24   Midge Golas, MD  umeclidinium-vilanterol (ANORO ELLIPTA ) 62.5-25 MCG/ACT AEPB Inhale 1 puff into the lungs daily. Patient not taking:  Reported on 05/12/2024 03/28/24   Beverley Leita LABOR, PA-C    Allergies: Patient has no known allergies.    Review of Systems  HENT:  Positive for sore throat.     Updated Vital Signs BP (!) 152/77   Pulse 98   Temp 99 F (37.2 C) (Oral)   Resp 18   Ht 5' 10 (1.778 m)   Wt 136 kg   SpO2 100%   BMI 43.02 kg/m   Physical Exam Vitals and nursing note reviewed.  Constitutional:      General: She is not in acute distress.    Appearance: She is not ill-appearing or toxic-appearing.  HENT:     Head: Normocephalic and atraumatic.     Mouth/Throat:     Mouth: Mucous membranes are moist.     Pharynx: No oropharyngeal exudate or posterior oropharyngeal erythema.     Comments: Right sided peritonsillar abscess. Eyes:     General: No scleral icterus.       Right eye: No discharge.        Left eye: No discharge.     Conjunctiva/sclera: Conjunctivae normal.  Cardiovascular:     Rate and Rhythm: Normal rate and regular rhythm.     Pulses: Normal pulses.     Heart sounds: Normal heart sounds. No murmur heard. Pulmonary:     Effort: Pulmonary effort is normal. No respiratory distress.     Breath sounds: Normal breath  sounds. No wheezing, rhonchi or rales.  Musculoskeletal:     Right lower leg: No edema.     Left lower leg: No edema.  Skin:    General: Skin is warm and dry.     Findings: No rash.  Neurological:     General: No focal deficit present.     Mental Status: She is alert and oriented to person, place, and time. Mental status is at baseline.  Psychiatric:        Mood and Affect: Mood normal.        Behavior: Behavior normal.     (all labs ordered are listed, but only abnormal results are displayed) Labs Reviewed  COMPREHENSIVE METABOLIC PANEL WITH GFR - Abnormal; Notable for the following components:      Result Value   Glucose, Bld 298 (*)    All other components within normal limits  CBG MONITORING, ED - Abnormal; Notable for the following components:    Glucose-Capillary 368 (*)    All other components within normal limits  GROUP A STREP BY PCR  RESP PANEL BY RT-PCR (RSV, FLU A&B, COVID)  [id number]  AEROBIC/ANAEROBIC CULTURE W GRAM STAIN (SURGICAL/DEEP WOUND)  CBC WITH DIFFERENTIAL/PLATELET  PREGNANCY, URINE  CYTOLOGY - NON PAP    EKG: None  Radiology: DG Chest 2 View Result Date: 06/17/2024 CLINICAL DATA:  Rhonchi on exam.  Diabetes.  Neck abscess. EXAM: CHEST - 2 VIEW COMPARISON:  05/12/2024 FINDINGS: Stable mild cardiomegaly. Both lungs are clear. The visualized skeletal structures are unremarkable. IMPRESSION: No active cardiopulmonary disease. Electronically Signed   By: Norleen DELENA Kil M.D.   On: 06/17/2024 09:50   CT Soft Tissue Neck W Contrast Result Date: 06/17/2024 EXAM: CT NECK WITH CONTRAST 06/17/2024 08:44:10 AM TECHNIQUE: CT of the neck was performed with the administration of 75 mL of iohexol  (OMNIPAQUE ) 350 MG/ML injection. Multiplanar reformatted images are provided for review. Automated exposure control, iterative reconstruction, and/or weight based adjustment of the mA/kV was utilized to reduce the radiation dose to as low as reasonably achievable. COMPARISON: Neck CT 05/30/2024. CLINICAL HISTORY: 48 year old female. Right palatine tonsillar abscess earlier this month. FINDINGS: AERODIGESTIVE TRACT: Progressive enlargement of rim enhancing right palatine tonsillar low density fluid collection consistent with abscess (series 4 image 71), now measuring 28 x 30 x 23 mm (AP x transverse x CC). Estimated volume: 10 mL. Ongoing inflammation throughout the adjacent right parapharyngeal space. Retropharyngeal space edema appears progressed. Associated asymmetric tonsillar enlargement and heterogeneous enhancement. Evidence of increased edema in the uvula on series 4 image 78 and along the right lateral wall of the pharynx. Superimposed retained secretions layering in the pharynx. Epiglottis and larynx remain within normal limits. SALIVARY  GLANDS: Secondary inflammation in the right submandibular space. Submandibular gland enhancement remains symmetric and within normal limits. Negative sublingual space. Negative parotid glands. THYROID : Unremarkable. LYMPH NODES: Relatively stable small and reactive appearing right cervical lymphadenopathy maximal at the level 2 and level 3 nodal stations (series 4 image 82). BONES: Carious and poor dentition. No acute osseous abnormality. OTHER: Major vascular structures in the bilateral neck and at the skull base are enhancing and patent including the right IJ. Left vertebral artery appears to be dominant, the right diminutive or absent (normal variation). Grossly negative visible brain parenchyma. Bilateral maxillary sinuses mucosal thickening, bubbly opacity and opacification has progressed. Mild right ethmoid sinus inflammation now. Tympanic cavities and mastoids remain well aerated. Negative visible upper chest when allowing for mild respiratory motion artifact and . Anterior left  chest wall sebaceous cyst on series 4 image 101. IMPRESSION: 1. Progressed right tonsillar abscess (now 3 cm, estimated 10 mL) and associated regional inflammation and edema since 05/30/2024. Increased edema of the uvula, along right lateral pharyngeal wall, and in the retropharyngeal space. 2. Progressed paranasal sinus inflammation. 3. Carious dentition. Electronically signed by: Helayne Hurst MD 06/17/2024 09:07 AM EST RP Workstation: [id number]     .Critical Care  Performed by: Hoy [name], PA-C Authorized by: Hoy [name], PA-C   Critical care provider statement:    Critical care time (minutes):  30   Critical care was necessary to treat or prevent imminent or life-threatening deterioration of the following conditions: peritonsillar abscess.   Critical care was time spent personally by me on the following activities:  Development of treatment plan with patient or surrogate, discussions with consultants,  evaluation of patient's response to treatment, examination of patient, ordering and review of laboratory studies, ordering and review of radiographic studies, ordering and performing treatments and interventions, pulse oximetry, re-evaluation of patient's condition and review of old charts   Care discussed with: admitting provider      Medications Ordered in the ED  acetaminophen  (TYLENOL ) tablet 1,000 mg (1,000 mg Oral Not Given 06/17/24 0803)  Ampicillin -Sulbactam (UNASYN ) 3 g in sodium chloride  0.9 % 100 mL IVPB (0 g Intravenous Stopped 06/17/24 1002)  fentaNYL  (SUBLIMAZE ) injection 50 mcg (has no administration in time range)  fentaNYL  (SUBLIMAZE ) injection 50 mcg (50 mcg Intravenous Given 06/17/24 0801)  dexamethasone  (DECADRON ) injection 8 mg (8 mg Intravenous Given 06/17/24 0802)  iohexol  (OMNIPAQUE ) 350 MG/ML injection 75 mL (75 mLs Intravenous Contrast Given 06/17/24 0843)                                    Medical Decision Making Amount and/or Complexity of Data Reviewed Labs: ordered. Radiology: ordered.  Risk OTC drugs. Prescription drug management.    This patient presents to the ED for concern of sore throat, this involves an extensive number of treatment options, and is a complaint that carries with it a high risk of complications and morbidity.  The differential diagnosis includes Flu/COVID/RSV, strep pharyngitis, sinusitis, peritonsillar abscess, retropharyngeal abscess, pneumonia, meningitis.   Co morbidities that complicate the patient evaluation  asthma, CHF, HTN, DM    Additional history obtained:  Additional history obtained from 12/10 ED note: patient with PTA drained by ENT and placed on oral ABX   Problem List / ED Course / Critical interventions / Medication management  Patient presents to ED concern for right peritonsillar abscess.  Patient had PTA drained 2 weeks ago, swelling started getting worse yesterday.  Physical exam concerning for right  PTA.  Rest of physical exam reassuring.  Patient afebrile with stable vitals. I Ordered, and personally interpreted labs.  Strep PCR negative.  CBC without leukocytosis or anemia.  CMP with hyperglycemia 298.  Respiratory panel negative. I ordered imaging studies including chest xray and CT soft tissue neck to assess for process contributing to patient's symptoms. I independently visualized and interpreted imaging which showed right sided PTA. I agree with the radiologist interpretation I requested consultation with the ENT provider on-call Dr. Anice,  and discussed lab and imaging findings as well as pertinent plan - they recommend: IV ABX, drainage of abscess, and hopefully discharge tomorrow morning. Shared results with patient. Patient agreeable to plan for admission. Consulted with Dr. Tharon who agrees to  admit patient. I have reviewed the patients home medicines and have made adjustments as needed    Social Determinants of Health:  none      Final diagnoses:  Peritonsillar abscess    ED Discharge Orders     None          Hoy [name], [place] 06/17/24 1044    Tegeler, Lonni PARAS, MD 06/17/24 1546  "

## 2024-06-17 NOTE — Hospital Course (Addendum)
 Julie [name] is a 49 y.o. female who was admitted to the [facility] Medicine Teaching Service at [facility] for right tonsillar abscess. Hospital course is outlined below by problem.   Right tonsillar abscess Presented with worsening symptoms after questionably completing antibiotic course for PTA diagnosed 18 days prior.  She was afebrile with stable vitals.  No respiratory compromise.  ENT was consulted who drained abscess and admitted for IV antibiotics.  Pain was controlled with a multimodal regimen.  Antibiotics were switched to Augmentin ***.  She remained stable over hospitalization and was discharged with instructions for PCP, dental, and ENT follow-up.  Other conditions that were chronic and stable T2DM (A1c***this admission, switch metformin  to extended release***), hypertension, tobacco/cocaine/marijuana use (TOC consulted with resources), GAD/MDD/PTSD (Zoloft  continued though recommend outpatient follow-up with psychiatry for resumption of risperidone  given patient side effects).  Issues for follow up: Ensure dental and ENT follow up Assess completion of all antibiotics Evaluate glycemic control on *** BP regimen - persistently elevated BP during and prior to admission Polysubstance use counseling

## 2024-06-17 NOTE — Assessment & Plan Note (Addendum)
 Patient with history of 15 psychiatric admissions, previously on sertraline  and risperidone  but stopped given sedative side effects. Discussed holding risperdal  until psychiatric follow up, but she is amenable to restarting zoloft  this admission. - Sertraline  50 mg daily - Recommend outpatient psychiatric follow up

## 2024-06-17 NOTE — ED Provider Triage Note (Signed)
 Emergency Medicine Provider Triage Evaluation Note  Julie [name] , a 48 y.o. female  was evaluated in triage.  Pt complains of peritonsillar abscess.  Review of Systems  Positive:  Negative:   Physical Exam  BP (!) 173/107 (BP Location: Left Arm)   Pulse 96   Temp 99 F (37.2 C) (Oral)   Resp 18   Ht 5' 10 (1.778 m)   Wt 136 kg   SpO2 96%   BMI 43.02 kg/m  Gen:   Awake, no distress   Resp:  Normal effort  MSK:   Moves extremities without difficulty  Other:    Medical Decision Making  Medically screening exam initiated at 7:15 AM.  Appropriate orders placed.  Julie [name] was informed that the remainder of the evaluation will be completed by another provider, this initial triage assessment does not replace that evaluation, and the importance of remaining in the ED until their evaluation is complete.  Was here a little over 2 weeks ago with PTA. Was drained by ENT. Patient thinks that they took their ABX as prescribed - but unsure since they are on a lot of medications. Swelling started again yesterday. Worse today. Difficulty tolerating oral secretions. Able to breath well. NAD.   Hoy Julie [name], [place] 06/17/24 [phone]

## 2024-06-17 NOTE — Assessment & Plan Note (Addendum)
 Mild diffuse abdominal pain on exam. Given it occurs after drinking, especially beer, and resolves on its own, suspect gas. May also be GERD, or residual pain from UTI, though she has questionably taken antibiotics for this recently, as well. Vitals and presentation less concerning for severe intra-abdominal pathology at this time. - Start pantoprazole  for GERD component - Consider simethicone if recurs - CTM

## 2024-06-17 NOTE — ED Notes (Signed)
 Cbg greater than 430. MD made aware.

## 2024-06-17 NOTE — Assessment & Plan Note (Addendum)
 Right sided without vitals or airway compromise. Patient with prior tonsillar abscesses, most recent on 05/30/24, recurrent after questionably complete Augmentin  course. Smoking, anatomical changes, poor oral health likely contributors to recurrence. S/p 3g Unasyn , 8 mg decadron , and fentanyl  50 mcg in the ED.  - Admit to FMTS for observation, med surg, attending Dr. Donah  - Vital signs per floor - ENT consulted in ED and drained abscess 9 mL, appreciate recommendations: - Continue Unasyn  3g Q6H - S/p 8 mg decadron , will await recommendations to continue - Possible AM discharge on PO abx - Pain control: acetaminophen  1,000 mg Q6H, ibuprofen  600 mg Q6H, oxycodone  5 mg Q6H PRN - AM labs CBC w/diff, BMP

## 2024-06-17 NOTE — H&P (Addendum)
 "    Hospital Admission History and Physical Service Pager: [phone]  Patient name: Julie [name] Medical record number: [id number] Date of Birth: 04/26/1976 Age: 48 y.o. Gender: female  Primary Care Provider: Pcp, No Consultants: ENT Code Status: Full code  Preferred Emergency Contact: Sister, Priscila   Contact Information     Name Relation Home Work Mobile   [place] Sister   [phone]      Other Contacts     Name Relation Home Work Mobile   Turnage,jay Brother   [phone]      Chief Complaint: Difficulty swallowing, sore throat  Differential and Medical Decision Making:  Julie [name] is a 48 y.o. female presenting with difficulty swallowing and sore throat. Differential for this patient's presentation of this includes  - Right sided tonsillar abscess: most likely given imaging, exam, and prior history - Right sided retropharyngeal abscess: less likely given imaging  - Strep throat: unlikely given presentation, negative testing, and exam Assessment & Plan Tonsillar abscess Right sided without vitals or airway compromise. Patient with prior tonsillar abscesses, most recent on 05/30/24, recurrent after questionably complete Augmentin  course. Smoking, anatomical changes, poor oral health likely contributors to recurrence. S/p 3g Unasyn , 8 mg decadron , and fentanyl  50 mcg in the ED.  - Admit to FMTS for observation, med surg, attending Dr. Donah  - Vital signs per floor - ENT consulted in ED and drained abscess 9 mL, appreciate recommendations: - Continue Unasyn  3g Q6H - S/p 8 mg decadron , will await recommendations to continue - Possible AM discharge on PO abx - Pain control: acetaminophen  1,000 mg Q6H, ibuprofen  600 mg Q6H, oxycodone  5 mg Q6H PRN - AM labs CBC w/diff, BMP Abdominal pain Mild diffuse abdominal pain on exam. Given it occurs after drinking, especially beer, and resolves on its own, suspect gas. May also be GERD, or residual pain from  UTI, though she has questionably taken antibiotics for this recently, as well. Vitals and presentation less concerning for severe intra-abdominal pathology at this time. - Start pantoprazole  for GERD component - Consider simethicone if recurs - CTM Polysubstance use disorder Patient with continued cigarette, cocaine, and marijuana use. - UDS ordered - TOC consult placed for substance use and assistance with getting patient a PCP MDD (major depressive disorder) Patient with history of 15 psychiatric admissions, previously on sertraline  and risperidone  but stopped given sedative side effects. Discussed holding risperdal  until psychiatric follow up, but she is amenable to restarting zoloft  this admission. - Sertraline  50 mg daily - Recommend outpatient psychiatric follow up T2DM (type 2 diabetes mellitus) (HCC) Recent A1c 9.2 in 02/2024. On metformin  at home. CBGs in ED 400s despite SSI. - Start lantus  10 units daily - Resistant sliding scale insulin  given steroid administration and elevated BMI - Repeat A1c, lipid panel Chronic health problem Asthma -continue home albuterol  nebulizer prn, anoro ellipta   HTN - continue home losartan  50 mg, increase amlodipine  to 10 mg  Chronic diastolic CHF - continue home lasix  20 mg daily, appears euvolemic  FEN/GI: Carb modified diet VTE Prophylaxis: Lovenox   Disposition: MedSurg  History of Present Illness:  Julie [name] is a 48 y.o. female presenting with difficulty swallowing and sore throat.  She was diagnosed with a peritonsillar abscess 18 days ago which was drained in the ED by ENT.  She was discharged on Augmentin  but is uncertain if she continued all of her antibiotics given she takes multiple different medications.    She initially had gotten better however, until yesterday,  at which point the swelling has started to get much worse.  She is also having trouble tolerating oral secretions.  No shortness of breath. TTP of the neck. No true  fevers. She has also had diarrhea.  In the ED, she was found to be afebrile with stable vitals.  Strep PCR was negative as well as a respiratory panel.  CBC without leukocytosis.  CT soft tissue of the neck demonstrated right sided peritonsillar abscess that was larger than prior.  ENT was consulted who drained 9 mL from the abscess.  She was consulted for admission for IV antibiotics and potential early discharge in the a.m.  Review Of Systems: Per HPI.  Pertinent Past Medical History: Chronic diastolic CHF Asthma Cocaine use disorder Tobacco use Hypertension GAD MDD PTSD Uncontrolled T2DM Remainder reviewed in history tab.   Pertinent Past Surgical History: C-section Ectopic pregnancy surgery in 2003 Multiple I&D's of previous peritonsillar abscesses ORIF humerus fracture 2016 Tubal ligation Remainder reviewed in history tab.   Pertinent Social History: Tobacco use: smokes up to 1 ppd for 30-something years Alcohol  use: socially up to twice weekly Other Substance use: cocaine socially with last use yesterday, marijuana last use 2 months ago Lives with boyfriend in a motel  Pertinent Family History: Female and female family members with multiple boils and abscesses  Important Outpatient Medications: Albuterol  1 to 2 puffs every 6 hours as needed for wheezing Amlodipine  5 mg daily Lasix  20 mg daily Losartan  50 mg daily Metformin  500 mg twice daily Risperdal  0.5 mg daily at bedtime, not taking due to zombie like effects Sertraline  50 mg daily, not taking Anoro Ellipta  1 puff daily  Objective: BP (!) 152/77   Pulse 98   Temp 99 F (37.2 C) (Oral)   Resp 18   Ht 5' 10 (1.778 m)   Wt 136 kg   SpO2 100%   BMI 43.02 kg/m  Exam: General: NAD, alert, pleasant Eyes: EOM grossly intact, anicteric ENTM: Throat erythematous with large peritonsillar abscess appreciated on the right, no active drainage s/p ENT intervention, poor dentition Cardiovascular: RRR, no  M/R/G Respiratory: Mild muffling of voice, no drooling, CTAB, breathing and speaking otherwise comfortably on RA Gastrointestinal: Soft, non-distended, mildly diffusely tender  MSK: moves limbs grossly equally  Neuro: no gross focal deficits  Psych: mood and affect appropriate  Labs:  CBC BMET  Recent Labs  Lab 06/17/24 0648  WBC 8.5  HGB 13.6  HCT 43.2  PLT 210   Recent Labs  Lab 06/17/24 0648  NA 138  K 3.7  CL 104  CO2 24  BUN 7  CREATININE 0.75  GLUCOSE 298*  CALCIUM  10.0     Negative strep and respiratory panel  EKG: Ordered  Imaging Studies Performed:  CT soft tissue of the neck with contrast IMPRESSION: 1. Progressed right tonsillar abscess (now 3 cm, estimated 10 mL) and associated regional inflammation and edema since 05/30/2024. Increased edema of the uvula, along right lateral pharyngeal wall, and in the retropharyngeal space. 2. Progressed paranasal sinus inflammation. 3. Carious dentition.  Chest x-ray IMPRESSION: No active cardiopulmonary disease.  Lennie Raguel MATSU, DO 06/17/2024, 10:41 AM PGY-1, [facility]   Tharon Lung, MD 06/17/2024, 10:41 AM PGY-3, [facility]  FPTS Intern pager: [phone], text pages welcome Secure chat group [facility]  "

## 2024-06-17 NOTE — Procedures (Signed)
 Procedure Note Pre-procedure diagnosis: Concern for Peritonsillar Abscess Post-procedure diagnosis: Same Procedure: Incision and Drainage of right Peritonsillar Abscess, CPT 42700 Complications: None apparent EBL: 0 mL Date: 06/17/2024   Indication: Julie [name] is a 48 y.o.  female with concern for right peritonsillar abscess. Decision was made for incision and drainage of abscess following discussion of risks/benefits of procedure.  The patient was identified as the correct patient.  The patient is able to give consent, therefore verbal consent was obtained. Surrounding mucosa was anesthetized with Lidocaine  spray. Anterior tonsillar pillar was injected with 1% Lidocaine  with 1:100,000 epinephrine .  A 18 G needle was used to aspirate the peritonsillar area, with 9cc return of purulence, which was cultured. Then, a 2 cm stab incision was made in the anterior tonsillar pillar. Wound was noted to be hemostatic.  Patient tolerated this procedure well and there were no immediately apparent complication.  Electronically signed by: Penne Croak, DO 06/17/2024 1:50 PM

## 2024-06-17 NOTE — Plan of Care (Addendum)
 FMTS Brief Progress Note  S:Evaluated at bedside with Dr. Toma. Pt is feeling light headed and hot which she states has been going on since prior to admission and is what brought her to the ER. She reports intermittent chest pressure since prior to admission and she is currently having some chest tightness.  Her pain is controlled currently, she would like to avoid narcotics if possible given history of substance use.   O: BP (!) 171/112   Pulse 92   Temp 98.2 F (36.8 C) (Oral)   Resp (!) 24   Ht 5' 10 (1.778 m)   Wt 136 kg   SpO2 96%   BMI 43.02 kg/m   General: no acute distress, diaphoretic  Cardiovascular: RRR, no m/r/g Respiratory: normal work of breathing on RA, expiratory wheezing in bilateral bases Abdomen: Normal bowel sounds, soft, non-tender  A/P: Chest pain Reviewed EKGs from the ED, no acute STT changes. Given current chest pain and diaphoresis will obtain troponins.   Wheezing Hx asthma. Covid, flu, RSV negative. CXR from today reviewed, appears to have some cardiomegaly but otherwise unremarkable with no consolidation/effusions.  Duoneb q4h PRN, will administer one now  T2DM Expect elevation of BGL 2/2 steroids, illness. She has received 40 total units SAI and 10U LAI. She is insulin  naive. Will continue to monitor  Tonsillar abscess - Continue Unasyn  3g q6h - Pain: tylenol  1000mg  q6h, ibuprofen  600mg  q6h, oxycodone  5mg  q6h PRN. Offered non-narcotic pain medicine, pain is currently controlled  Rest of plan per day team   - Orders reviewed. Labs for AM ordered, which was adjusted as needed.   Christhoper Busbee, Elyce, DO 06/17/2024, 9:18 PM PGY-2, [facility] Night Resident  Please page [phone] with questions.

## 2024-06-17 NOTE — Assessment & Plan Note (Addendum)
 Asthma -continue home albuterol  nebulizer prn, anoro ellipta   HTN - continue home losartan  50 mg, increase amlodipine  to 10 mg  Chronic diastolic CHF - continue home lasix  20 mg daily, appears euvolemic

## 2024-06-17 NOTE — Consult Note (Signed)
 " Reason for Consult:throat pain Referring Physician: Paulett mays, pa-c  Julie [name] is an 48 y.o. female.  HPI:  48 y.o. female with PMHx asthma, CHF, HTN, DM who presented to ED concerned for peritonsillar abscess. Patient was diagnosed with a PTA 18 days ago which was drained in ED by ENT. Patient states that she had 3 pills of her abx left. Patient stating that throat swelling worsened 3 days ago. Patient stating that swelling has been quickly getting worse over the past 24 hours. Is having troubles tolerating oral secretions. No SOB. She reports that she has had 4 abscesses drained from her right tonsil. Zero from the left side. She is a diabetic with an A1c of >9.     Past Medical History:  Diagnosis Date   Asthma    Diabetes mellitus without complication (HCC)    Fracture, humerus closed, shaft 07/11/2014   left   History of bronchitis    Mental health problem    Open fracture of great toe of left foot 12/25/2015    Past Surgical History:  Procedure Laterality Date   CESAREAN SECTION     ECTOPIC PREGNANCY SURGERY  2003   INCISION AND DRAINAGE Left 12/25/2015   Procedure: INCISION AND DRAINAGE With  Amputation of distal tip of Left Great and Second Toe.;  Surgeon: Kay CHRISTELLA Cummins, MD;  Location: [facility];  Service: Orthopedics;  Laterality: Left;   LIPOMA EXCISION     chest   ORIF HUMERUS FRACTURE Left 07/15/2014   Procedure: OPEN REDUCTION INTERNAL FIXATION (ORIF) LEFT HUMERAL SHAFT ;  Surgeon: Evalene JONETTA Chancy, MD;  Location: [facility];  Service: Orthopedics;  Laterality: Left;   TUBAL LIGATION  2003    History reviewed. No pertinent family history.  Social History:  reports that she has been smoking cigarettes. She has a 9 pack-year smoking history. She has never used smokeless tobacco. She reports current alcohol  use. She reports current drug use. Drugs: Marijuana, Crack cocaine, and MDMA (Ecstacy).  Allergies: Allergies[1]  Medications: I have  reviewed the patient's current medications.  Results for orders placed or performed during the hospital encounter of 06/17/24 (from the past 48 hours)  CBC with Differential     Status: None   Collection Time: 06/17/24  6:48 AM  Result Value Ref Range   WBC 8.5 4.0 - 10.5 K/uL   RBC 5.01 3.87 - 5.11 MIL/uL   Hemoglobin 13.6 12.0 - 15.0 g/dL   HCT 56.7 63.9 - 53.9 %   MCV 86.2 80.0 - 100.0 fL   MCH 27.1 26.0 - 34.0 pg   MCHC 31.5 30.0 - 36.0 g/dL   RDW 85.8 88.4 - 84.4 %   Platelets 210 150 - 400 K/uL   nRBC 0.0 0.0 - 0.2 %   Neutrophils Relative % 65 %   Neutro Abs 5.5 1.7 - 7.7 K/uL   Lymphocytes Relative 25 %   Lymphs Abs 2.2 0.7 - 4.0 K/uL   Monocytes Relative 8 %   Monocytes Absolute 0.7 0.1 - 1.0 K/uL   Eosinophils Relative 1 %   Eosinophils Absolute 0.1 0.0 - 0.5 K/uL   Basophils Relative 1 %   Basophils Absolute 0.0 0.0 - 0.1 K/uL   Immature Granulocytes 0 %   Abs Immature Granulocytes 0.02 0.00 - 0.07 K/uL    Comment: Performed at [facility], [address], [place], [place] [address]  Comprehensive metabolic panel     Status: Abnormal  Collection Time: 06/17/24  6:48 AM  Result Value Ref Range   Sodium 138 135 - 145 mmol/L   Potassium 3.7 3.5 - 5.1 mmol/L   Chloride 104 98 - 111 mmol/L   CO2 24 22 - 32 mmol/L   Glucose, Bld 298 (H) 70 - 99 mg/dL    Comment: Glucose reference range applies only to samples taken after fasting for at least 8 hours.   BUN 7 6 - 20 mg/dL   Creatinine, Ser 9.24 0.44 - 1.00 mg/dL   Calcium  10.0 8.9 - 10.3 mg/dL   Total Protein 6.8 6.5 - 8.1 g/dL   Albumin 3.8 3.5 - 5.0 g/dL   AST 19 15 - 41 U/L   ALT 11 0 - 44 U/L   Alkaline Phosphatase 69 38 - 126 U/L   Total Bilirubin 0.3 0.0 - 1.2 mg/dL   GFR, Estimated >39 >39 mL/min    Comment: (NOTE) Calculated using the CKD-EPI Creatinine Equation (2021)    Anion gap 10 5 - 15    Comment: Performed at [facility], [address] [place], [place] [address]  Group A Strep  by PCR     Status: None   Collection Time: 06/17/24  6:48 AM   Specimen: Throat; Sterile Swab  Result Value Ref Range   Group A Strep by PCR NOT DETECTED NOT DETECTED    Comment: Performed at [facility], [address], [place], [place] [address]  POC CBG, ED     Status: Abnormal   Collection Time: 06/17/24  6:51 AM  Result Value Ref Range   Glucose-Capillary 368 (H) 70 - 99 mg/dL    Comment: Glucose reference range applies only to samples taken after fasting for at least 8 hours.  Resp panel by RT-PCR (RSV, Flu A&B, Covid) Anterior Nasal Swab     Status: None   Collection Time: 06/17/24  7:00 AM   Specimen: Anterior Nasal Swab  Result Value Ref Range   SARS Coronavirus 2 by RT PCR NEGATIVE NEGATIVE   Influenza A by PCR NEGATIVE NEGATIVE   Influenza B by PCR NEGATIVE NEGATIVE    Comment: (NOTE) The Xpert Xpress SARS-CoV-2/FLU/RSV plus assay is intended as an aid in the diagnosis of influenza from Nasopharyngeal swab specimens and should not be used as a sole basis for treatment. Nasal washings and aspirates are unacceptable for Xpert Xpress SARS-CoV-2/FLU/RSV testing.  Fact Sheet for Patients: bloggercourse.com  Fact Sheet for Healthcare Providers: seriousbroker.it  This test is not yet approved or cleared by the [place]  FDA and has been authorized for detection and/or diagnosis of SARS-CoV-2 by FDA under an Emergency Use Authorization (EUA). This EUA will remain in effect (meaning this test can be used) for the duration of the COVID-19 declaration under Section 564(b)(1) of the Act, 21 U.S.C. section 360bbb-3(b)(1), unless the authorization is terminated or revoked.     Resp Syncytial Virus by PCR NEGATIVE NEGATIVE    Comment: (NOTE) Fact Sheet for Patients: bloggercourse.com  Fact Sheet for Healthcare Providers: seriousbroker.it  This test is not yet  approved or cleared by the [place]  FDA and has been authorized for detection and/or diagnosis of SARS-CoV-2 by FDA under an Emergency Use Authorization (EUA). This EUA will remain in effect (meaning this test can be used) for the duration of the COVID-19 declaration under Section 564(b)(1) of the Act, 21 U.S.C. section 360bbb-3(b)(1), unless the authorization is terminated or revoked.  Performed at [facility], [address] [place], [place] [address]   Aerobic/Anaerobic Culture w Gram Stain (surgical/deep wound)     Status: None (Preliminary result)   Collection Time: 06/17/24 11:25 AM   Specimen: Abscess  Result Value Ref Range   Specimen Description ABSCESS THROAT    Special Requests      RIGHT TONSIL ABSCESS Performed at [facility], [address] [place], [place] [address]    Gram Stain PENDING    Culture PENDING    Report Status PENDING   CBG monitoring, ED     Status: Abnormal   Collection Time: 06/17/24 12:31 PM  Result Value Ref Range   Glucose-Capillary 409 (H) 70 - 99 mg/dL    Comment: Glucose reference range applies only to samples taken after fasting for at least 8 hours.    DG Chest 2 View Result Date: 06/17/2024 CLINICAL DATA:  Rhonchi on exam.  Diabetes.  Neck abscess. EXAM: CHEST - 2 VIEW COMPARISON:  05/12/2024 FINDINGS: Stable mild cardiomegaly. Both lungs are clear. The visualized skeletal structures are unremarkable. IMPRESSION: No active cardiopulmonary disease. Electronically Signed   By: Norleen DELENA Kil M.D.   On: 06/17/2024 09:50   CT Soft Tissue Neck W Contrast Result Date: 06/17/2024 EXAM: CT NECK WITH CONTRAST 06/17/2024 08:44:10 AM TECHNIQUE: CT of the neck was performed with the administration of 75 mL of iohexol  (OMNIPAQUE ) 350 MG/ML injection. Multiplanar reformatted images are provided for review. Automated exposure control, iterative reconstruction, and/or weight based adjustment of the mA/kV was utilized to reduce the  radiation dose to as low as reasonably achievable. COMPARISON: Neck CT 05/30/2024. CLINICAL HISTORY: 48 year old female. Right palatine tonsillar abscess earlier this month. FINDINGS: AERODIGESTIVE TRACT: Progressive enlargement of rim enhancing right palatine tonsillar low density fluid collection consistent with abscess (series 4 image 71), now measuring 28 x 30 x 23 mm (AP x transverse x CC). Estimated volume: 10 mL. Ongoing inflammation throughout the adjacent right parapharyngeal space. Retropharyngeal space edema appears progressed. Associated asymmetric tonsillar enlargement and heterogeneous enhancement. Evidence of increased edema in the uvula on series 4 image 78 and along the right lateral wall of the pharynx. Superimposed retained secretions layering in the pharynx. Epiglottis and larynx remain within normal limits. SALIVARY GLANDS: Secondary inflammation in the right submandibular space. Submandibular gland enhancement remains symmetric and within normal limits. Negative sublingual space. Negative parotid glands. THYROID : Unremarkable. LYMPH NODES: Relatively stable small and reactive appearing right cervical lymphadenopathy maximal at the level 2 and level 3 nodal stations (series 4 image 82). BONES: Carious and poor dentition. No acute osseous abnormality. OTHER: Major vascular structures in the bilateral neck and at the skull base are enhancing and patent including the right IJ. Left vertebral artery appears to be dominant, the right diminutive or absent (normal variation). Grossly negative visible brain parenchyma. Bilateral maxillary sinuses mucosal thickening, bubbly opacity and opacification has progressed. Mild right ethmoid sinus inflammation now. Tympanic cavities and mastoids remain well aerated. Negative visible upper chest when allowing for mild respiratory motion artifact and . Anterior left chest wall sebaceous cyst on series 4 image 101. IMPRESSION: 1. Progressed right tonsillar abscess  (now 3 cm, estimated 10 mL) and associated regional inflammation and edema since 05/30/2024. Increased edema of the uvula, along right lateral pharyngeal wall, and in the retropharyngeal space. 2. Progressed paranasal sinus inflammation. 3. Carious dentition. Electronically signed by: Helayne Hurst MD 06/17/2024 09:07 AM EST RP Workstation: [id number]    ROS Blood pressure (!) 177/106, pulse (!) 101, temperature 98.3 F (  36.8 C), temperature source Oral, resp. rate 18, height 5' 10 (1.778 m), weight 136 kg, SpO2 98%. Physical Exam BP (!) 177/106 (BP Location: Right Arm)   Pulse (!) 101   Temp 98.3 F (36.8 C) (Oral)   Resp 18   Ht 5' 10 (1.778 m)   Wt 136 kg   SpO2 98%   BMI 43.02 kg/m  General: Well developed, well nourished. In acute distress. Voice muffled, diaphoretic Head/Face: Normocephalic. No sinus tenderness. Facial nerve intact and equal bilaterally. No facial lacerations. Eyes: PERRL, no scleral icterus or conjunctival hemorrhage. EOMI. Hearing: Normal speech reception.  Nose: No gross deformity or lesions. No purulent discharge. No turbinate hypertrophy. Mouth/Oropharynx: Lips without any lesions. Dentition poor with missing teeth. No mucosal lesions within the oropharynx. Right soft palate fullness, erythema inferiorly and firm. Fluctuance present. Uvula deviated left and edematous.  Neck full ROM. Shotty lymphadenopathy R>L Resp: RA, no stridor Extremities: No edema or cyanosis. Warm and well-perfused. Skin: No scars or lesions on face or neck. Neurologic: CN II-XII grossly intact. Moving all extremities without gross abnormality. Other:     Assessment/Plan: Right peritonsillar abscess with history of I&D 18 days ago - Large right 3cm diameter PTA on my personal read of CT neck with contrast. No lateral neck abscesses.  - Recurrent tonsillitis, and hx of medication noncompliance - Repeat aspiration performed in the ED today by me - Discussed likelihood of  tonsillectomy in the future, she has had 4 PTA drained all on the right - Aspirate sent for culture and cytology today - Recommend IV abx overnight and DC in the AM, if VSS and reporting improvement.  - Recommend 14 day course of augmentin . Wil adjust pending culture - Regular diet. Warm compresses to right neck q2h while awake.  Penne Croak 06/17/2024, 1:37 PM        [1] No Known Allergies  "

## 2024-06-18 ENCOUNTER — Other Ambulatory Visit (HOSPITAL_COMMUNITY): Payer: Self-pay

## 2024-06-18 LAB — BASIC METABOLIC PANEL WITH GFR
Anion gap: 10 (ref 5–15)
BUN: 12 mg/dL (ref 6–20)
CO2: 23 mmol/L (ref 22–32)
Calcium: 9.9 mg/dL (ref 8.9–10.3)
Chloride: 106 mmol/L (ref 98–111)
Creatinine, Ser: 0.77 mg/dL (ref 0.44–1.00)
GFR, Estimated: >60 mL/min
Glucose, Bld: 285 mg/dL — ABNORMAL HIGH (ref 70–99)
Potassium: 4.2 mmol/L (ref 3.5–5.1)
Sodium: 139 mmol/L (ref 135–145)

## 2024-06-18 LAB — CBG MONITORING, ED: Glucose-Capillary: 277 mg/dL — ABNORMAL HIGH (ref 70–99)

## 2024-06-18 LAB — GLUCOSE, CAPILLARY: Glucose-Capillary: 245 mg/dL — ABNORMAL HIGH (ref 70–99)

## 2024-06-18 MED ORDER — IBUPROFEN 600 MG PO TABS
600.0000 mg | ORAL_TABLET | Freq: Four times a day (QID) | ORAL | 0 refills | Status: DC | PRN
  Filled 2024-06-18: qty 30, 8d supply, fill #0

## 2024-06-18 MED ORDER — AMLODIPINE BESYLATE 10 MG PO TABS
10.0000 mg | ORAL_TABLET | Freq: Every day | ORAL | 0 refills | Status: DC
  Filled 2024-06-18: qty 30, 30d supply, fill #0

## 2024-06-18 MED ORDER — AMOXICILLIN-POT CLAVULANATE 875-125 MG PO TABS
1.0000 | ORAL_TABLET | Freq: Two times a day (BID) | ORAL | Status: DC
  Administered 2024-06-18: 1 via ORAL
  Filled 2024-06-18 (×2): qty 1

## 2024-06-18 MED ORDER — AMOXICILLIN-POT CLAVULANATE 875-125 MG PO TABS
1.0000 | ORAL_TABLET | Freq: Two times a day (BID) | ORAL | 0 refills | Status: AC
  Filled 2024-06-18: qty 27, 14d supply, fill #0

## 2024-06-18 MED ORDER — ACETAMINOPHEN 500 MG PO TABS
1000.0000 mg | ORAL_TABLET | Freq: Four times a day (QID) | ORAL | 0 refills | Status: AC | PRN
  Filled 2024-06-18: qty 30, 4d supply, fill #0

## 2024-06-18 NOTE — Assessment & Plan Note (Signed)
 Strep, flu, covid negative    Right sided without vitals or airway compromise. Patient with prior tonsillar abscesses, most recent on 05/30/24, recurrent after questionably complete Augmentin  course. Smoking, anatomical changes, poor oral health likely contributors to recurrence. S/p 3g Unasyn , 8 mg decadron , and fentanyl  50 mcg in the ED.  - Admit to FMTS for observation, med surg, attending Dr. Donah  - Vital signs per floor - ENT consulted in ED and drained abscess 9 mL, appreciate recommendations: - Continue Unasyn  3g Q6H - S/p 8 mg decadron , will await recommendations to continue - Possible AM discharge on PO abx - Pain control: acetaminophen  1,000 mg Q6H, ibuprofen  600 mg Q6H, oxycodone  5 mg Q6H PRN - AM labs CBC w/diff, BMP

## 2024-06-18 NOTE — Assessment & Plan Note (Signed)
" ° ° ° °  Asthma -continue home albuterol  nebulizer prn, anoro ellipta   HTN - continue home losartan  50 mg, increase amlodipine  to 10 mg  Chronic diastolic CHF - continue home lasix  20 mg daily, appears euvolemic "

## 2024-06-18 NOTE — Assessment & Plan Note (Signed)
" ° ° ° °  Patient with history of 15 psychiatric admissions, previously on sertraline  and risperidone  but stopped given sedative side effects. Discussed holding risperdal  until psychiatric follow up, but she is amenable to restarting zoloft  this admission. - Sertraline  50 mg daily - Recommend outpatient psychiatric follow up "

## 2024-06-18 NOTE — Assessment & Plan Note (Signed)
" ° ° ° ° °  Mild diffuse abdominal pain on exam. Given it occurs after drinking, especially beer, and resolves on its own, suspect gas. May also be GERD, or residual pain from UTI, though she has questionably taken antibiotics for this recently, as well. Vitals and presentation less concerning for severe intra-abdominal pathology at this time. - Start pantoprazole  for GERD component - Consider simethicone if recurs - CTM "

## 2024-06-18 NOTE — Progress Notes (Shared)
 "    Daily Progress Note Intern Pager: [phone]  Patient name: Julie [name] Medical record number: [id number] Date of birth: 11/03/1975 Age: 48 y.o. Gender: female  Primary Care Provider: Pcp, No Consultants: *** Code Status: ***  Pt Overview and Major Events to Date:  ***  Medical Decision Making:  ***. Pertinent PMH/PSH includes ***.    High Bps otherwise VSS  CBC pending***  Assessment & Plan Tonsillar abscess Strep, flu, covid negative    Right sided without vitals or airway compromise. Patient with prior tonsillar abscesses, most recent on 05/30/24, recurrent after questionably complete Augmentin  course. Smoking, anatomical changes, poor oral health likely contributors to recurrence. S/p 3g Unasyn , 8 mg decadron , and fentanyl  50 mcg in the ED.  - Admit to FMTS for observation, med surg, attending Dr. Donah  - Vital signs per floor - ENT consulted in ED and drained abscess 9 mL, appreciate recommendations: - Continue Unasyn  3g Q6H - S/p 8 mg decadron , will await recommendations to continue - Possible AM discharge on PO abx - Pain control: acetaminophen  1,000 mg Q6H, ibuprofen  600 mg Q6H, oxycodone  5 mg Q6H PRN - AM labs CBC w/diff, BMP Abdominal pain      Mild diffuse abdominal pain on exam. Given it occurs after drinking, especially beer, and resolves on its own, suspect gas. May also be GERD, or residual pain from UTI, though she has questionably taken antibiotics for this recently, as well. Vitals and presentation less concerning for severe intra-abdominal pathology at this time. - Start pantoprazole  for GERD component - Consider simethicone if recurs - CTM Polysubstance use disorder     Patient with continued cigarette, cocaine, and marijuana use. - UDS ordered - TOC consult placed for substance use and assistance with getting patient a PCP MDD (major depressive disorder)     Patient with history of 15 psychiatric admissions, previously on  sertraline  and risperidone  but stopped given sedative side effects. Discussed holding risperdal  until psychiatric follow up, but she is amenable to restarting zoloft  this admission. - Sertraline  50 mg daily - Recommend outpatient psychiatric follow up T2DM (type 2 diabetes mellitus) (HCC)     Recent A1c 9.2 in 02/2024. On metformin  at home. CBGs in ED 400s despite SSI. - Start lantus  10 units daily - Resistant sliding scale insulin  given steroid administration and elevated BMI - Repeat A1c, lipid panel Chronic health problem     Asthma -continue home albuterol  nebulizer prn, anoro ellipta   HTN - continue home losartan  50 mg, increase amlodipine  to 10 mg  Chronic diastolic CHF - continue home lasix  20 mg daily, appears euvolemic   *** bullet points preferred for A/P, please avoid repeating one liner/medical decision making, delete statement to sign ***    Chronic and Stable Issues: ***  FEN/GI: *** PPx: *** Dispo:{FPTSDISOLIST:27587} {FPTSDISOTIME:27588}. Barriers include ***.   Subjective:  ***  Objective: Temp:  [98.2 F (36.8 C)-98.3 F (36.8 C)] 98.3 F (36.8 C) (12/29 0714) Pulse Rate:  [91-101] 91 (12/29 0714) Resp:  [18-25] 20 (12/29 0714) BP: (152-191)/(77-119) 178/112 (12/29 0714) SpO2:  [95 %-100 %] 100 % (12/29 0714) Physical Exam: General: *** Cardiovascular: *** Respiratory: *** Abdomen: *** Extremities: ***  Laboratory: Most recent CBC Lab Results  Component Value Date   WBC 8.5 06/17/2024   HGB 13.6 06/17/2024   HCT 43.2 06/17/2024   MCV 86.2 06/17/2024   PLT 210 06/17/2024   Most recent BMP    Latest Ref Rng & Units 06/18/2024  6:49 AM  BMP  Glucose 70 - 99 mg/dL 714   BUN 6 - 20 mg/dL 12   Creatinine 9.55 - 1.00 mg/dL 9.22   Sodium 864 - 854 mmol/L 139   Potassium 3.5 - 5.1 mmol/L 4.2   Chloride 98 - 111 mmol/L 106   CO2 22 - 32 mmol/L 23   Calcium  8.9 - 10.3 mg/dL 9.9     Other pertinent labs ***   Imaging/Diagnostic  Tests: Radiologist Impression: *** My interpretation: PIERRETTE Manon Jester, DO 06/18/2024, 7:49 AM  PGY-1, [facility] FPTS Intern pager: [phone], text pages welcome Secure chat group [facility] Teaching Service   "

## 2024-06-18 NOTE — Assessment & Plan Note (Signed)
" ° ° ° °  Patient with continued cigarette, cocaine, and marijuana use. - UDS ordered - TOC consult placed for substance use and assistance with getting patient a PCP "

## 2024-06-18 NOTE — ED Notes (Signed)
 Handoff report given to [employer]
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
## 2024-06-18 NOTE — Assessment & Plan Note (Signed)
" ° ° ° °  Recent A1c 9.2 in 02/2024. On metformin  at home. CBGs in ED 400s despite SSI. - Start lantus  10 units daily - Resistant sliding scale insulin  given steroid administration and elevated BMI - Repeat A1c, lipid panel "

## 2024-06-18 NOTE — ED Notes (Signed)
 Pt and spouse at bedside requesting food and drinks. Reports no pain or any other needs at this time.

## 2024-06-18 NOTE — ED Notes (Signed)
 Pt pain assessed. Ordered Tylenol  and Advil  given.

## 2024-06-18 NOTE — Discharge Summary (Addendum)
 "  Family Medicine Teaching [facility] Discharge Summary  Patient name: Julie [name] Medical record number: [id number] Date of birth: 11-Oct-1975 Age: 48 y.o. Gender: female Date of Admission: 06/17/2024  Date of Discharge: 06-18-24 Admitting Physician: Raguel KANDICE Lee, DO  Primary Care Provider: Pcp, No Consultants: ENT  Indication for Hospitalization: Peritonsillar abscess  Discharge Diagnoses/Problem List:  Principal Problem for Admission: Peritonsillar abscess with IV antibiotic treatment Other Problems addressed during stay:  Active Problems:   Tonsillar abscess   Abdominal pain   Polysubstance use disorder   MDD (major depressive disorder)   T2DM (type 2 diabetes mellitus) [facility])   Brief Hospital Course:  Julie [name] is a 48 y.o. female who was admitted to the [facility] Teaching Service at [facility] for right tonsillar abscess. Hospital course is outlined below by problem.   Right tonsillar abscess s/p I&D Presented with worsening symptoms after questionably completing antibiotic course for PTA diagnosed 18 days prior.  She was afebrile with stable vitals.  No respiratory compromise.  ENT was consulted who drained abscess and admitted for IV antibiotics (Unasyn ).  Pain was controlled with a multimodal regimen.  Antibiotics were switched to Augmentin  for 14 day course (last dose 07/01/24).  She remained stable over hospitalization and was discharged with instructions for PCP and ENT follow-up.  SW was consulted and provided PCP resources.    Other conditions that were chronic and stable T2DM: - elevated glucose during admission, patient is currently taking metformin  500mg  BID. Patient is insulin  naive and was treated with 10 U LAI with meal time coverage.   Hypertension: BP elevated during admission, increased patients amlodipine  to 10 mg daily  Tobacco/cocaine/marijuana use (TOC consulted with resources), GAD/MDD/PTSD (Zoloft  continued though recommend outpatient  follow-up with psychiatry for resumption of risperidone  given patient side effects).  Issues for follow up: Ensure dental and ENT follow up Assess completion of all antibiotics Evaluate glycemic control on current regimen and consider adjusting as needed  BP regimen - persistently elevated BP during and prior to admission, increased amlodipine  to 10 mg  Polysubstance use counseling provided, please continue working outpatient     Results/Tests Pending at Time of Discharge:  Unresulted Labs (From admission, onward)     Start     Ordered   06/18/24 0658  CBC with Differential/Platelet  Once,   R        06/18/24 0658   06/18/24 0500  CBC with Differential/Platelet  Tomorrow morning,   R        06/17/24 1154   06/17/24 1224  Hemoglobin A1c  Add-on,   AD       Comments: To assess prior glycemic control    06/17/24 1224   06/17/24 1124  Urine Drug Screen  ONCE - STAT,   URGENT        06/17/24 1123   06/17/24 0722  Pregnancy, urine  Once,   URGENT        06/17/24 0721           Disposition: Home  Discharge Condition: Stable, taking PO  Discharge Exam:  Vitals:   06/18/24 1048 06/18/24 1249  BP: (!) 160/110 (!) 149/97  Pulse: 91 86  Resp: 20   Temp: 98.3 F (36.8 C)   SpO2: 100% 100%   General: Awake, alert, NAD. Communicates clearly. HEENT: NCAT. PERRLA EOMI. Anicteric sclera, conjunctiva clear. MMM. Cardio: RRR. Normal S1, S2. No murmur, rub, gallop. 2+ radial and dorsalis pedis pulses b/l w/ good capillary refill.  Resp: CTA bilaterally. No wheezes, rales, or rhonchi. Normal work of breathing on room air Abdomen: soft, non-tender, non-distended.  Neuro: AOx4. No focal deficits. Moves all extremities.   Significant Procedures: Peritonsillar abscess drainage  Significant Labs and Imaging:  Recent Labs  Lab 06/17/24 0648  WBC 8.5  HGB 13.6  HCT 43.2  PLT 210   Recent Labs  Lab 06/17/24 0648 06/18/24 0649  NA 138 139  K 3.7 4.2  CL 104 106  CO2 24 23   GLUCOSE 298* 285*  BUN 7 12  CREATININE 0.75 0.77  CALCIUM  10.0 9.9  ALKPHOS 69  --   AST 19  --   ALT 11  --   ALBUMIN 3.8  --    CT soft tissue neck w/ contrast 12/28 1. Progressed right tonsillar abscess (now 3 cm, estimated 10 mL) and associated regional inflammation and edema since 05/30/2024. Increased edema of the uvula, along right lateral pharyngeal wall, and in the retropharyngeal space. 2. Progressed paranasal sinus inflammation. 3. Carious dentition.   Discharge Medications:  Allergies as of 06/18/2024   Not on File      Medication List     TAKE these medications    Acetaminophen  Extra Strength 500 MG Tabs Take 2 tablets (1,000 mg total) by mouth every 6 (six) hours as needed for mild pain (pain score 1-3).   albuterol  108 (90 Base) MCG/ACT inhaler Commonly known as: VENTOLIN  HFA Inhale 1-2 puffs into the lungs every 6 (six) hours as needed for wheezing or shortness of breath.   amLODipine  10 MG tablet Commonly known as: NORVASC  Take 1 tablet (10 mg total) by mouth daily. What changed:  medication strength how much to take   amoxicillin -clavulanate 875-125 MG tablet Commonly known as: AUGMENTIN  Take 1 tablet by mouth every 12 (twelve) hours for 27 doses.   Anoro Ellipta  62.5-25 MCG/ACT Aepb Generic drug: umeclidinium-vilanterol Inhale 1 puff into the lungs daily.   furosemide  20 MG tablet Commonly known as: LASIX  Take 1 tablet (20 mg total) by mouth daily.   losartan  50 MG tablet Commonly known as: Cozaar  Take 1 tablet (50 mg total) by mouth daily.   metFORMIN  500 MG tablet Commonly known as: GLUCOPHAGE  Take 1 tablet (500 mg total) by mouth 2 (two) times daily with a meal.   risperiDONE  0.5 MG tablet Commonly known as: RisperDAL  Take 1 tablet (0.5 mg total) by mouth at bedtime.   sertraline  50 MG tablet Commonly known as: ZOLOFT  Take 1 tablet (50 mg total) by mouth daily.        Discharge Instructions: Please refer to Patient  Instructions section of EMR for full details.  Patient was counseled important signs and symptoms that should prompt return to medical care, changes in medications, dietary instructions, activity restrictions, and follow up appointments.   Follow-Up Appointments:  Follow-up Information     [facility] Follow up.   Why: Please call the office and schedule to be coordinated with PCP and a hospital follow up. Contact information: [address] [place]  [address] [phone]                Leafy Scriver DO PGY-1, [facility]  "

## 2024-06-19 ENCOUNTER — Ambulatory Visit: Payer: Self-pay | Admitting: Family Medicine

## 2024-06-19 NOTE — Progress Notes (Signed)
 Discharged on Augmentin  so should be appropriate from beta lactamase standpoint

## 2024-06-22 LAB — AEROBIC/ANAEROBIC CULTURE W GRAM STAIN (SURGICAL/DEEP WOUND)

## 2024-06-23 LAB — CYTOLOGY - NON PAP

## 2024-07-03 ENCOUNTER — Emergency Department (HOSPITAL_COMMUNITY)

## 2024-07-03 ENCOUNTER — Other Ambulatory Visit: Payer: Self-pay

## 2024-07-03 ENCOUNTER — Inpatient Hospital Stay (HOSPITAL_COMMUNITY)

## 2024-07-03 ENCOUNTER — Inpatient Hospital Stay (HOSPITAL_COMMUNITY)
Admission: EM | Admit: 2024-07-03 | Discharge: 2024-07-06 | DRG: 291 | Disposition: A | Discharge status: Home / Self Care | Attending: Family Medicine | Admitting: Family Medicine

## 2024-07-03 DIAGNOSIS — I5031 Acute diastolic (congestive) heart failure: Secondary | ICD-10-CM | POA: Diagnosis not present

## 2024-07-03 DIAGNOSIS — E1165 Type 2 diabetes mellitus with hyperglycemia: Secondary | ICD-10-CM | POA: Diagnosis present

## 2024-07-03 DIAGNOSIS — J45909 Unspecified asthma, uncomplicated: Secondary | ICD-10-CM | POA: Diagnosis present

## 2024-07-03 DIAGNOSIS — I509 Heart failure, unspecified: Secondary | ICD-10-CM

## 2024-07-03 DIAGNOSIS — Z79899 Other long term (current) drug therapy: Secondary | ICD-10-CM

## 2024-07-03 DIAGNOSIS — R000 Tachycardia, unspecified: Secondary | ICD-10-CM | POA: Diagnosis present

## 2024-07-03 DIAGNOSIS — Z5900 Homelessness unspecified: Secondary | ICD-10-CM

## 2024-07-03 DIAGNOSIS — F17210 Nicotine dependence, cigarettes, uncomplicated: Secondary | ICD-10-CM | POA: Diagnosis present

## 2024-07-03 DIAGNOSIS — Z8781 Personal history of (healed) traumatic fracture: Secondary | ICD-10-CM

## 2024-07-03 DIAGNOSIS — I10 Essential (primary) hypertension: Secondary | ICD-10-CM | POA: Diagnosis not present

## 2024-07-03 DIAGNOSIS — R45851 Suicidal ideations: Secondary | ICD-10-CM | POA: Diagnosis present

## 2024-07-03 DIAGNOSIS — F411 Generalized anxiety disorder: Secondary | ICD-10-CM | POA: Diagnosis present

## 2024-07-03 DIAGNOSIS — Z5982 Transportation insecurity: Secondary | ICD-10-CM

## 2024-07-03 DIAGNOSIS — Z789 Other specified health status: Secondary | ICD-10-CM

## 2024-07-03 DIAGNOSIS — I43 Cardiomyopathy in diseases classified elsewhere: Secondary | ICD-10-CM | POA: Diagnosis present

## 2024-07-03 DIAGNOSIS — E785 Hyperlipidemia, unspecified: Secondary | ICD-10-CM | POA: Diagnosis present

## 2024-07-03 DIAGNOSIS — J029 Acute pharyngitis, unspecified: Secondary | ICD-10-CM

## 2024-07-03 DIAGNOSIS — R0600 Dyspnea, unspecified: Secondary | ICD-10-CM | POA: Insufficient documentation

## 2024-07-03 DIAGNOSIS — Z794 Long term (current) use of insulin: Secondary | ICD-10-CM | POA: Diagnosis not present

## 2024-07-03 DIAGNOSIS — I5043 Acute on chronic combined systolic (congestive) and diastolic (congestive) heart failure: Secondary | ICD-10-CM | POA: Diagnosis present

## 2024-07-03 DIAGNOSIS — Z5948 Other specified lack of adequate food: Secondary | ICD-10-CM

## 2024-07-03 DIAGNOSIS — K0889 Other specified disorders of teeth and supporting structures: Secondary | ICD-10-CM | POA: Diagnosis present

## 2024-07-03 DIAGNOSIS — J36 Peritonsillar abscess: Secondary | ICD-10-CM | POA: Diagnosis present

## 2024-07-03 DIAGNOSIS — Z6838 Body mass index (BMI) 38.0-38.9, adult: Secondary | ICD-10-CM

## 2024-07-03 DIAGNOSIS — F4310 Post-traumatic stress disorder, unspecified: Secondary | ICD-10-CM | POA: Diagnosis present

## 2024-07-03 DIAGNOSIS — R9431 Abnormal electrocardiogram [ECG] [EKG]: Secondary | ICD-10-CM | POA: Diagnosis present

## 2024-07-03 DIAGNOSIS — Z5941 Food insecurity: Secondary | ICD-10-CM

## 2024-07-03 DIAGNOSIS — E119 Type 2 diabetes mellitus without complications: Secondary | ICD-10-CM

## 2024-07-03 DIAGNOSIS — Z59819 Housing instability, housed unspecified: Secondary | ICD-10-CM | POA: Diagnosis not present

## 2024-07-03 DIAGNOSIS — R0602 Shortness of breath: Secondary | ICD-10-CM

## 2024-07-03 DIAGNOSIS — I110 Hypertensive heart disease with heart failure: Secondary | ICD-10-CM | POA: Diagnosis present

## 2024-07-03 DIAGNOSIS — I444 Left anterior fascicular block: Secondary | ICD-10-CM | POA: Diagnosis present

## 2024-07-03 DIAGNOSIS — Z59868 Other specified financial insecurity: Secondary | ICD-10-CM

## 2024-07-03 DIAGNOSIS — Z7984 Long term (current) use of oral hypoglycemic drugs: Secondary | ICD-10-CM

## 2024-07-03 DIAGNOSIS — I5023 Acute on chronic systolic (congestive) heart failure: Secondary | ICD-10-CM | POA: Diagnosis not present

## 2024-07-03 DIAGNOSIS — Z8759 Personal history of other complications of pregnancy, childbirth and the puerperium: Secondary | ICD-10-CM

## 2024-07-03 DIAGNOSIS — F329 Major depressive disorder, single episode, unspecified: Secondary | ICD-10-CM | POA: Diagnosis present

## 2024-07-03 DIAGNOSIS — E876 Hypokalemia: Secondary | ICD-10-CM | POA: Diagnosis present

## 2024-07-03 DIAGNOSIS — R070 Pain in throat: Secondary | ICD-10-CM | POA: Diagnosis present

## 2024-07-03 LAB — CBC WITH DIFFERENTIAL/PLATELET
Abs Immature Granulocytes: 0.02 K/uL (ref 0.00–0.07)
Basophils Absolute: 0 K/uL (ref 0.0–0.1)
Basophils Relative: 1 %
Eosinophils Absolute: 0.3 K/uL (ref 0.0–0.5)
Eosinophils Relative: 5 %
HCT: 42.2 % (ref 36.0–46.0)
Hemoglobin: 12.9 g/dL (ref 12.0–15.0)
Immature Granulocytes: 0 %
Lymphocytes Relative: 31 %
Lymphs Abs: 2.1 K/uL (ref 0.7–4.0)
MCH: 27 pg (ref 26.0–34.0)
MCHC: 30.6 g/dL (ref 30.0–36.0)
MCV: 88.3 fL (ref 80.0–100.0)
Monocytes Absolute: 0.6 K/uL (ref 0.1–1.0)
Monocytes Relative: 9 %
Neutro Abs: 3.5 K/uL (ref 1.7–7.7)
Neutrophils Relative %: 54 %
Platelets: 176 K/uL (ref 150–400)
RBC: 4.78 MIL/uL (ref 3.87–5.11)
RDW: 14.3 % (ref 11.5–15.5)
WBC: 6.6 K/uL (ref 4.0–10.5)
nRBC: 0 % (ref 0.0–0.2)

## 2024-07-03 LAB — BASIC METABOLIC PANEL WITH GFR
Anion gap: 10 (ref 5–15)
BUN: 12 mg/dL (ref 6–20)
CO2: 23 mmol/L (ref 22–32)
Calcium: 10.2 mg/dL (ref 8.9–10.3)
Chloride: 108 mmol/L (ref 98–111)
Creatinine, Ser: 0.69 mg/dL (ref 0.44–1.00)
GFR, Estimated: >60 mL/min
Glucose, Bld: 297 mg/dL — ABNORMAL HIGH (ref 70–99)
Potassium: 3.9 mmol/L (ref 3.5–5.1)
Sodium: 141 mmol/L (ref 135–145)

## 2024-07-03 LAB — TROPONIN T, HIGH SENSITIVITY
Troponin T High Sensitivity: 26 ng/L — ABNORMAL HIGH (ref 0–19)
Troponin T High Sensitivity: 29 ng/L — ABNORMAL HIGH (ref 0–19)
Troponin T High Sensitivity: 31 ng/L — ABNORMAL HIGH (ref 0–19)

## 2024-07-03 LAB — URINE DRUG SCREEN
Amphetamines: NEGATIVE
Barbiturates: NEGATIVE
Benzodiazepines: NEGATIVE
Cocaine: NEGATIVE
Fentanyl: NEGATIVE
Methadone Scn, Ur: NEGATIVE
Opiates: NEGATIVE
Tetrahydrocannabinol: NEGATIVE

## 2024-07-03 LAB — MAGNESIUM: Magnesium: 1.4 mg/dL — ABNORMAL LOW (ref 1.7–2.4)

## 2024-07-03 LAB — PRO BRAIN NATRIURETIC PEPTIDE: Pro Brain Natriuretic Peptide: 1200 pg/mL — ABNORMAL HIGH

## 2024-07-03 LAB — HEPATIC FUNCTION PANEL
ALT: 38 U/L (ref 0–44)
AST: 34 U/L (ref 15–41)
Albumin: 3.7 g/dL (ref 3.5–5.0)
Alkaline Phosphatase: 64 U/L (ref 38–126)
Bilirubin, Direct: 0.1 mg/dL (ref 0.0–0.2)
Indirect Bilirubin: 0.2 mg/dL — ABNORMAL LOW (ref 0.3–0.9)
Total Bilirubin: 0.3 mg/dL (ref 0.0–1.2)
Total Protein: 6.6 g/dL (ref 6.5–8.1)

## 2024-07-03 LAB — GROUP A STREP BY PCR: Group A Strep by PCR: NOT DETECTED

## 2024-07-03 MED ORDER — AMLODIPINE BESYLATE 10 MG PO TABS
10.0000 mg | ORAL_TABLET | Freq: Every day | ORAL | Status: DC
  Administered 2024-07-03 – 2024-07-06 (×4): 10 mg via ORAL
  Filled 2024-07-03: qty 2
  Filled 2024-07-03 (×3): qty 1

## 2024-07-03 MED ORDER — MAGNESIUM SULFATE 2 GM/50ML IV SOLN
2.0000 g | Freq: Once | INTRAVENOUS | Status: AC
  Administered 2024-07-03: 2 g via INTRAVENOUS
  Filled 2024-07-03: qty 50

## 2024-07-03 MED ORDER — FUROSEMIDE 20 MG PO TABS
20.0000 mg | ORAL_TABLET | Freq: Every day | ORAL | Status: DC
  Administered 2024-07-04 – 2024-07-06 (×3): 20 mg via ORAL
  Filled 2024-07-03 (×3): qty 1

## 2024-07-03 MED ORDER — UMECLIDINIUM-VILANTEROL 62.5-25 MCG/ACT IN AEPB
1.0000 | INHALATION_SPRAY | Freq: Every day | RESPIRATORY_TRACT | Status: DC
  Administered 2024-07-04 – 2024-07-06 (×3): 1 via RESPIRATORY_TRACT
  Filled 2024-07-03: qty 14

## 2024-07-03 MED ORDER — NITROGLYCERIN 2 % TD OINT
0.5000 [in_us] | TOPICAL_OINTMENT | Freq: Once | TRANSDERMAL | Status: AC
  Administered 2024-07-03: 0.5 [in_us] via TOPICAL
  Filled 2024-07-03: qty 1

## 2024-07-03 MED ORDER — METOPROLOL TARTRATE 5 MG/5ML IV SOLN: 5.0000 mg | Freq: Once | INTRAVENOUS | Status: DC

## 2024-07-03 MED ORDER — ENOXAPARIN SODIUM 40 MG/0.4ML IJ SOSY
40.0000 mg | PREFILLED_SYRINGE | INTRAMUSCULAR | Status: DC
  Administered 2024-07-03: 40 mg via SUBCUTANEOUS
  Filled 2024-07-03: qty 0.4

## 2024-07-03 MED ORDER — FUROSEMIDE 20 MG PO TABS
40.0000 mg | ORAL_TABLET | Freq: Once | ORAL | Status: AC
  Administered 2024-07-03: 40 mg via ORAL
  Filled 2024-07-03: qty 2

## 2024-07-03 MED ORDER — VANCOMYCIN HCL 10 G IV SOLR
2500.0000 mg | Freq: Once | INTRAVENOUS | Status: AC
  Administered 2024-07-03: 2500 mg via INTRAVENOUS
  Filled 2024-07-03: qty 2500

## 2024-07-03 MED ORDER — SERTRALINE HCL 50 MG PO TABS
50.0000 mg | ORAL_TABLET | Freq: Every day | ORAL | Status: DC
  Administered 2024-07-03 – 2024-07-06 (×4): 50 mg via ORAL
  Filled 2024-07-03 (×4): qty 1

## 2024-07-03 MED ORDER — SODIUM CHLORIDE 0.9 % IV SOLN
3.0000 g | Freq: Three times a day (TID) | INTRAVENOUS | Status: DC
  Administered 2024-07-03 – 2024-07-04 (×2): 3 g via INTRAVENOUS
  Filled 2024-07-03 (×2): qty 8

## 2024-07-03 MED ORDER — IPRATROPIUM-ALBUTEROL 0.5-2.5 (3) MG/3ML IN SOLN
3.0000 mL | Freq: Once | RESPIRATORY_TRACT | Status: AC
  Administered 2024-07-03: 3 mL via RESPIRATORY_TRACT
  Filled 2024-07-03: qty 3

## 2024-07-03 MED ORDER — IOHEXOL 350 MG/ML SOLN
75.0000 mL | Freq: Once | INTRAVENOUS | Status: AC | PRN
  Administered 2024-07-03: 75 mL via INTRAVENOUS

## 2024-07-03 MED ORDER — LOSARTAN POTASSIUM 50 MG PO TABS
50.0000 mg | ORAL_TABLET | Freq: Every day | ORAL | Status: DC
  Administered 2024-07-03 – 2024-07-04 (×2): 50 mg via ORAL
  Filled 2024-07-03 (×2): qty 1

## 2024-07-03 MED ORDER — VANCOMYCIN HCL 10 G IV SOLR
2500.0000 mg | INTRAVENOUS | Status: DC
  Filled 2024-07-03: qty 25

## 2024-07-03 MED ORDER — ALBUTEROL SULFATE (2.5 MG/3ML) 0.083% IN NEBU: 2.5000 mg | INHALATION_SOLUTION | Freq: Four times a day (QID) | RESPIRATORY_TRACT | Status: DC | PRN

## 2024-07-03 MED ORDER — ACETAMINOPHEN 325 MG PO TABS
650.0000 mg | ORAL_TABLET | Freq: Four times a day (QID) | ORAL | Status: DC | PRN
  Administered 2024-07-03 – 2024-07-05 (×4): 650 mg via ORAL
  Filled 2024-07-03 (×5): qty 2

## 2024-07-03 MED ORDER — SODIUM CHLORIDE 0.9 % IV SOLN
3.0000 g | Freq: Once | INTRAVENOUS | Status: AC
  Administered 2024-07-03: 3 g via INTRAVENOUS
  Filled 2024-07-03: qty 8

## 2024-07-03 MED ORDER — RISPERIDONE 0.25 MG PO TABS
0.5000 mg | ORAL_TABLET | Freq: Every day | ORAL | Status: DC
  Administered 2024-07-03 – 2024-07-05 (×3): 0.5 mg via ORAL
  Filled 2024-07-03: qty 1
  Filled 2024-07-03 (×2): qty 2

## 2024-07-03 MED ORDER — OXYCODONE HCL 5 MG PO TABS
5.0000 mg | ORAL_TABLET | Freq: Four times a day (QID) | ORAL | Status: DC | PRN
  Administered 2024-07-03 – 2024-07-05 (×4): 5 mg via ORAL
  Filled 2024-07-03 (×5): qty 1

## 2024-07-03 MED ORDER — FUROSEMIDE 10 MG/ML IJ SOLN
40.0000 mg | Freq: Once | INTRAMUSCULAR | Status: AC
  Administered 2024-07-03: 40 mg via INTRAVENOUS
  Filled 2024-07-03: qty 4

## 2024-07-03 MED ORDER — OXYCODONE-ACETAMINOPHEN 5-325 MG PO TABS
1.0000 | ORAL_TABLET | Freq: Once | ORAL | Status: AC
  Administered 2024-07-03: 1 via ORAL
  Filled 2024-07-03: qty 1

## 2024-07-03 NOTE — ED Notes (Signed)
 Pt moved to 44 yellow zone from H12

## 2024-07-03 NOTE — ED Notes (Signed)
 Pt called x2 to go to a room. Pt could not be found, but their belongings were still seen in the lobby.

## 2024-07-03 NOTE — ED Provider Triage Note (Signed)
 Emergency Medicine Provider Triage Evaluation Note  CHANNA HAZELETT , a 49 y.o. female  was evaluated in triage.  Pt complains of facial swelling and throat pain worsening for the last 2 days.  Recent admission and drainage for peritonsillar abscess.  Review of Systems  Positive: Fever Negative: Difficulty breathing  Physical Exam  BP (!) 161/104   Pulse 99   Temp 97.8 F (36.6 C)   Resp 16   SpO2 100%  Gen:   Awake, no distress   Resp:  Normal effort  MSK:   Moves extremities without difficulty  Other:  Facial swelling right jaw.  Difficulty opening mouth fully but not trismus.  No neck crepitus.  Medical Decision Making  Medically screening exam initiated at 8:46 AM.  Appropriate orders placed.  CYLINDA SANTOLI was informed that the remainder of the evaluation will be completed by another provider, this initial triage assessment does not replace that evaluation, and the importance of remaining in the ED until their evaluation is complete.     Towana Ozell BROCKS, MD 07/03/24 1705

## 2024-07-03 NOTE — H&P (Signed)
 "    Hospital Admission History and Physical Service Pager: [phone]  Patient name: Julie [name] Medical record number: [id number] Date of Birth: September 30, 1975 Age: 49 y.o. Gender: female  Primary Care Provider: Pcp, No Consultants: none Code Status: FULL which was confirmed with family if patient unable to confirm   Preferred Emergency Contact:  Contact Information     Name Relation Home Work Julie [name] Sister   [phone]      Other Contacts     Name Relation Home Work Mobile   Julie [name] Brother   [phone]        Chief Complaint: jaw pain, shortness of breath  Assessment and Plan: Julie [name] is a 49 y.o. female presenting with jaw pain, voice change and worsening shortness of breath. Differential for presentation includes   CHF exacerbation most likely given clinical picture, though CXR is reassuring with stable cardiomegaly. Will continue to diurses and monitor response.  PNA possible though CXR does not suggest this.  Asthma exacerbation possible however patient does not have wheezing on exam and DuoNebs in ED did not seem to improve dyspnea.  Additionally, concern for worsening peritonsillar abscess/dental infection given significant tenderness on exam and new muffled voice. CT Neck shows overall improvement from prior.  Assessment & Plan Peritonsillar abscess Previously treated at admission 12/28 - 12/29. She did complete full 14d course of Augmentin . Symptoms continue to be right-sided without any airway compromise. She did not have ENT follow up. S/p 1 dose Unasyn  in ED. - Admit to FMTS, attending Dr. Anders - Med surg, Vital signs per floor - PT/OT to treat - Continue antibiotics - Unasyn , Vancomycin  per phamracy - MRSA swab - AM CBC, BMP - will discuss with ENT for consideration of management options Acute exacerbation of CHF (congestive heart failure) (HCC) Dyspnea S/p duoneb and PO lasix  40 mg in ED. She does not appear  significantly volume overloaded. - continue home lasix  20 mg daily - daily weights, strict I&Os Chronic health problem MDD - continue sertraline  50 mg daily T2DM - hold home metformin . At last admission started on 10U lantus  but unclear that she was doing this at home; consider re-adding as needed. Will start SSI here. Asthma - continue home albuterol , anoro ellipta  PRN HTN - continue home losartan  50 mg, amlodipine  10 mg   FEN/GI: Carb modified diet VTE Prophylaxis: lovenox   Disposition: med tele  History of Present Illness:  Julie [name] is a 49 y.o. female presenting with worsening right jaw/neck pain, muffled voice, worsening dyspnea in the last 2 to 3 days.  She denies any fevers or other recent URI symptoms.  No nausea, vomiting, abdominal pain.  No headaches or vision changes. She was recently hospitalized on our service 12/28 - 12/29 for peritonsillar abscess which was drained by ENT and she completed full 14-day course of antibiotics.  She reports she did feel improved for a while until symptoms began to worsen again.  Now she has significant tenderness to her right jaw just below the ear with swelling.  She and friend at bedside both point out that her voice is more muffled than usual. She has had increasing difficulty catching her breath in the last few days and sounds crackly.  She has been using her home albuterol  inhaler without much relief. Reports taking all of her medications as prescribed.  In the ED, she received DuoNeb treatment, 40 mg p.o. Lasix  and a single dose of Unasyn .  CXR without any active disease.  CT soft tissue neck does show an overall improved appearance as compared to prior, with 1 cm right peritonsillar abscess remaining.  Review Of Systems: Per HPI.  Pertinent Past Medical History: Chronic diastolic CHF Asthma Cocaine use disorder Tobacco use Hypertension GAD MDD PTSD Uncontrolled T2DM Remainder reviewed in history tab.   Pertinent Past  Surgical History: C-section Ectopic pregnancy surgery in 2003 Multiple I&D's of previous peritonsillar abscesses ORIF humerus fracture 2016 Tubal ligation  Remainder reviewed in history tab.   Pertinent Social History: Tobacco use: smokes up to 1 ppd for 30-something years Alcohol  use: socially up to twice weekly Other Substance use: cocaine socially with last use yesterday, marijuana last use 2 months ago Lives with boyfriend in a motel  Pertinent Family History: Female and female family members with multiple boils and abscesses. Remainder reviewed in history tab.   Important Outpatient Medications: Albuterol  1 to 2 puffs every 6 hours as needed for wheezing Amlodipine  5 mg daily Lasix  20 mg daily Losartan  50 mg daily Metformin  500 mg twice daily Risperdal  0.5 mg daily at bedtime Sertraline  50 mg daily, not taking Anoro Ellipta  1 puff daily Remainder reviewed in medication history.   Objective: BP (!) 224/127 (BP Location: Left Arm) Comment: MD notified  Pulse (!) 110   Temp 98.7 F (37.1 C) (Oral)   Resp (!) 24   SpO2 100%  Exam: General: Uncomfortable-appearing, no acute distress. HEENT: Head normocephalic, mild soft tissue swelling to the right jaw and this area is very tender to palpation. PERRLA, EOM grossly intact, MMM. Cardio: Regular rate, regular rhythm, no murmurs on exam. Pulm: Clear, no wheezing, but does have increased work of breathing and tachypnea. Abdominal: bowel sounds present, soft, non-tender, non-distended. No HSM. Extremities: No peripheral edema. Moves all extremities equally. Neuro: Alert and oriented x3, speech normal in content, no facial asymmetry Psych:  Cognition and judgment appear intact.   Labs:  CBC BMET  Recent Labs  Lab 07/03/24 0848  WBC 6.6  HGB 12.9  HCT 42.2  PLT 176   Recent Labs  Lab 07/03/24 0848  NA 141  K 3.9  CL 108  CO2 23  BUN 12  CREATININE 0.69  GLUCOSE 297*  CALCIUM  10.2    Group A strep  negative proBNP 1200 Troponin 26, awaiting repeat Magnesium  1.4  EKG: Sinus tachycardia, P waves present.  Normal PR interval, narrow QRS.  No evidence of QTc prolongation.  CXR: IMPRESSION: No active disease.  CT neck: IMPRESSION: 1. Overall improved appearance of tonsillitis and a right peritonsillar abscess with a 1 cm collection remaining. 2. No new abnormality identified in the neck.   Lafe Domino, DO 07/03/2024, 4:58 PM PGY-2,  Family Medicine  FPTS Intern pager: [phone], text pages welcome Secure chat group [facility] Teaching Service  "

## 2024-07-03 NOTE — Assessment & Plan Note (Signed)
 MDD - continue sertraline  50 mg daily T2DM - hold home metformin . At last admission started on 10U lantus  but unclear that she was doing this at home; consider re-adding as needed. Will start SSI here. Asthma - continue home albuterol , anoro ellipta  PRN HTN - continue home losartan  50 mg, amlodipine  10 mg

## 2024-07-03 NOTE — ED Notes (Signed)
Pt returned to the lobby

## 2024-07-03 NOTE — Plan of Care (Addendum)
 FMTS Brief Progress Note  S: Patient was seen and examined at bedside by myself and Dr. Stoney. Patient is lying prone to help with her breathing. She is able to roll over and sit up without difficulty. She states she is having some trouble breathing and doesn't feel like it's related to her throat swelling. She continues to sat above 99% on RA and speaking to us  in full sentences with some deep breaths in between. She is able to swallow without difficulty but has a productive sounding cough. Denies recent fevers, sick contacts, CP, n/v/d, abdominal pain, LE edema, or any other complaints at this time. She states her pain is pretty well controlled at this time.   O: BP (!) 221/164 (BP Location: Right Arm)   Pulse (!) 112   Temp 98.6 F (37 C) (Oral)   Resp (!) 25   SpO2 98%   General: A&O, NAD HEENT: No sign of trauma, EOM grossly intact Cardiac: tachycardic in 120s, no m/r/g Respiratory: diminished throughout with faint coarse breath sounds, takes a pause during deep inspiration to take a full breath, no tracheal tugging, no increased WOB GI: non-distended  Extremities: NTTP, trace pitting pedal edema Neuro: Normal gait, moves all four extremities appropriately. Psych: Appropriate mood and affect  A/P: Plan as per day team note including the following: - With c/o difficulty breathing and concern for severely elevated blood pressures and tachycardia, will order troponin, CXR, EKG, and 40mg  IV Lasix  due now. Concern that patient is still volume overloaded even after Lasix  earlier today.  - Will contact cardiology if troponin elevated or new EKG changes - Consider Xopenex  if lung exam continues as above for COPD/asthma exacerbation as this should have less affect on HR.  - Orders reviewed. Labs for AM ordered, which was adjusted as needed.  - If condition changes, plan includes contact FMTS.   Lupie Credit, DO 07/03/2024, 9:01 PM PGY-1,  Family Medicine Night Resident  Please  page [phone] with questions.

## 2024-07-03 NOTE — Assessment & Plan Note (Signed)
 S/p duoneb and PO lasix  40 mg in ED. She does not appear significantly volume overloaded. - continue home lasix  20 mg daily - daily weights, strict I&Os

## 2024-07-03 NOTE — ED Triage Notes (Signed)
 Pt. Stated, Jesus had a bad pain in my mouth and it goes to my throat. I think I have some type of abscess.

## 2024-07-03 NOTE — Assessment & Plan Note (Signed)
 Previously treated at admission 12/28 - 12/29. She did complete full 14d course of Augmentin . Symptoms continue to be right-sided without any airway compromise. She did not have ENT follow up. S/p 1 dose Unasyn  in ED. - Admit to FMTS, attending Dr. Anders - Med surg, Vital signs per floor - PT/OT to treat - Continue antibiotics - Unasyn , Vancomycin  per phamracy - MRSA swab - AM CBC, BMP - will discuss with ENT for consideration of management options

## 2024-07-03 NOTE — ED Provider Notes (Signed)
 " [place] [facility] AT [facility] Provider Note   CSN: [id number] Arrival date & time: 07/03/24  9196     Patient presents with: Sore Throat, mouth pain, and Dental Pain   Julie [name] is a 49 y.o. female.  24 female with history of asthma/COPD not on oxygen, diabetes, hypertension, CHF with an ejection fraction of 50 to 55% on echo in March 2025, prior cocaine use disorder, mood disorder and recurrent tonsillar abscess presents with right sided jaw swelling.  She states she started having pain a few days ago and then swelling over the last 2 days.  She has a little bit of throat pain but not as severe as when she was here with her abscess.  She also feels like she is wheezing and her COPD is acting up.  She is a daily smoker and using inhalers as prescribed.  She states she has a history of congestive heart failure on Lasix  and notes that her ankles are swollen as well.  Felt feverish the other night but no fever at present.  No chest pain.  No difficulty speaking or swallowing.  Of note was admitted December 28 through 29 with a right-sided peritonsillar abscess that was drained by ENT.  She was started on IV Unasyn  and transition to Augmentin  with resolution.    Prior to Admission medications  Medication Sig Start Date End Date Taking? Authorizing Provider  acetaminophen  (TYLENOL ) 500 MG tablet Take 2 tablets (1,000 mg total) by mouth every 6 (six) hours as needed for mild pain (pain score 1-3). 06/18/24   Elicia Hamlet, MD  albuterol  (VENTOLIN  HFA) 108 (90 Base) MCG/ACT inhaler Inhale 1-2 puffs into the lungs every 6 (six) hours as needed for wheezing or shortness of breath. 05/12/24   Ruthell Lonni FALCON, PA-C  amLODipine  (NORVASC ) 10 MG tablet Take 1 tablet (10 mg total) by mouth daily. 06/18/24   Elicia Hamlet, MD  furosemide  (LASIX ) 20 MG tablet Take 1 tablet (20 mg total) by mouth daily. 04/30/24   Midge Golas, MD  losartan  (COZAAR ) 50 MG tablet Take 1  tablet (50 mg total) by mouth daily. 04/30/24   Midge Golas, MD  metFORMIN  (GLUCOPHAGE ) 500 MG tablet Take 1 tablet (500 mg total) by mouth 2 (two) times daily with a meal. 04/30/24   Midge Golas, MD  risperiDONE  (RISPERDAL ) 0.5 MG tablet Take 1 tablet (0.5 mg total) by mouth at bedtime. 04/30/24   Midge Golas, MD  sertraline  (ZOLOFT ) 50 MG tablet Take 1 tablet (50 mg total) by mouth daily. 04/30/24   Midge Golas, MD  umeclidinium-vilanterol (ANORO ELLIPTA ) 62.5-25 MCG/ACT AEPB Inhale 1 puff into the lungs daily. 03/28/24   Beverley Leita LABOR, PA-C    Allergies: Patient has no allergy information on record.    Review of Systems Positive for throat pain Updated Vital Signs BP (!) 186/124 (BP Location: Left Arm)   Pulse (!) 105   Temp 98.3 F (36.8 C)   Resp (!) 22   SpO2 99%   Physical Exam  Constitutional: Patient appears well-developed and well-nourished. No distress.  HENT:  Head: Normocephalic and atraumatic.  Mouth/Throat: Oropharynx is clear and moist. No oropharyngeal exudate.  Right-sided jaw swelling with gingival edema and some fluctuance noted adjacent to the region of tooth 29. Eyes: Conjunctivae are normal. Pupils are equal, round, and reactive to light. Neck: Normal range of motion. Neck supple.  Cardiovascular: Normal rate, regular rhythm, normal heart sounds and intact distal pulses.  Pulmonary/Chest: Mildly tachypneic.  Diffuse wheezing.  Abdominal: Soft. Bowel sounds are normal. No distension or tenderness.  Musculoskeletal: Normal range of motion.  +1 ankle edema.  Neurological: Patient is alert with normal muscle tone. Coordination normal.  Skin: Skin is warm and dry. No diaphoresis.  Psychiatric: Normal mood and affect. Normal behavior. Judgment and thought content normal.  Nursing note and vitals reviewed.  (all labs ordered are listed, but only abnormal results are displayed) Labs Reviewed  BASIC METABOLIC PANEL WITH GFR - Abnormal; Notable  for the following components:      Result Value   Glucose, Bld 297 (*)    All other components within normal limits  PRO BRAIN NATRIURETIC PEPTIDE - Abnormal; Notable for the following components:   Pro Brain Natriuretic Peptide 1,200.0 (*)    All other components within normal limits  HEPATIC FUNCTION PANEL - Abnormal; Notable for the following components:   Indirect Bilirubin 0.2 (*)    All other components within normal limits  MAGNESIUM  - Abnormal; Notable for the following components:   Magnesium  1.4 (*)    All other components within normal limits  TROPONIN T, HIGH SENSITIVITY - Abnormal; Notable for the following components:   Troponin T High Sensitivity 26 (*)    All other components within normal limits  GROUP A STREP BY PCR  CBC WITH DIFFERENTIAL/PLATELET  URINE DRUG SCREEN    EKG: EKG Interpretation Date/Time:  Tuesday July 03 2024 14:42:56 EST Ventricular Rate:  108 PR Interval:  134 QRS Duration:  94 QT Interval:  364 QTC Calculation: 487 R Axis:   -58  Text Interpretation: Sinus tachycardia Right atrial enlargement Left axis deviation TWI in 1, AVL unchanged. New TWI V5-6. Minimal voltage criteria for LVH, may be normal variant ( Cornell product ) Septal infarct , age undetermined T wave abnormality, consider lateral ischemia Abnormal ECG When compared with ECG of 17-Jun-2024 15:44, PREVIOUS ECG IS PRESENT Confirmed by Elyana Grabski [phone]) on 07/03/2024 3:05:12 PM  Radiology: CT Soft Tissue Neck W Contrast Result Date: 07/03/2024 EXAM: CT NECK WITH CONTRAST 07/03/2024 02:22:00 PM TECHNIQUE: CT of the neck was performed with the administration of 75 mL of iohexol  (OMNIPAQUE ) 350 MG/ML injection. Multiplanar reformatted images are provided for review. Automated exposure control, iterative reconstruction, and/or weight based adjustment of the mA/kV was utilized to reduce the radiation dose to as low as reasonably achievable. COMPARISON: CT neck 06/17/2024. CLINICAL  HISTORY: Epiglottitis or tonsillitis suspected. FINDINGS: AERODIGESTIVE TRACT: Moderate motion artifact through the pharynx and larynx. Decreased, mild to moderate residual asymmetric enlargement of the right palatine tonsil compared to the left. A right peritonsillar fluid collection on the prior study has nearly completely resolved, with a 10 x 4 mm collection remaining (series 3 image 43). Regional submucosal edema has completely or nearly completely resolved. No retropharyngeal fluid collection. No epiglottic swelling. Widely patent airway. SALIVARY GLANDS: The parotid and submandibular glands are unremarkable. THYROID : Unremarkable. LYMPH NODES: Persistent scattered subcentimeter short axis lymph nodes in the right neck, likely reactive. SOFT TISSUES: Unchanged 2 cm inclusion cyst in the anterior left upper chest wall. BONES: Numerous dental caries. Mild cervical spondylosis. No acute osseous abnormality. VASCULATURE: Unchanged poor visualization of a likely markedly hypoplastic right vertebral artery with normal appearing, dominant left vertebral artery. OTHER: Progressive opacification of the left maxillary sinus, likely by a large mucous retention cyst. Small to moderate volume secretions in the right maxillary sinus with overall improved aeration. Clear mastoid air cells. Moderate motion artifact through the  lung apices without evidence of consolidation or mass. IMPRESSION: 1. Overall improved appearance of tonsillitis and a right peritonsillar abscess with a 1 cm collection remaining. 2. No new abnormality identified in the neck. Electronically signed by: Dasie Hamburg MD 07/03/2024 03:20 PM EST RP Workstation: [id number]   DG Chest 1 View Result Date: 07/03/2024 CLINICAL DATA:  Shortness of breath EXAM: CHEST  1 VIEW COMPARISON:  June 17, 2024 FINDINGS: Stable cardiomegaly. Both lungs are clear. The visualized skeletal structures are unremarkable. IMPRESSION: No active disease. Electronically Signed    By: Lynwood Landy Raddle M.D.   On: 07/03/2024 15:11      Medications Ordered in the ED  Ampicillin -Sulbactam (UNASYN ) 3 g in sodium chloride  0.9 % 100 mL IVPB (3 g Intravenous New Bag/Given 07/03/24 1519)  magnesium  sulfate IVPB 2 g 50 mL (has no administration in time range)  oxyCODONE -acetaminophen  (PERCOCET/ROXICET) 5-325 MG per tablet 1 tablet (1 tablet Oral Given 07/03/24 0859)  ipratropium-albuterol  (DUONEB) 0.5-2.5 (3) MG/3ML nebulizer solution 3 mL (3 mLs Nebulization Given 07/03/24 1434)  furosemide  (LASIX ) tablet 40 mg (40 mg Oral Given 07/03/24 1517)  nitroGLYCERIN  (NITROGLYN) 2 % ointment 0.5 inch (0.5 inches Topical Given 07/03/24 1517)  iohexol  (OMNIPAQUE ) 350 MG/ML injection 75 mL (75 mLs Intravenous Contrast Given 07/03/24 1439)    This patient presents to the ED with chief complaint(s) of jaw pain and shortness of breath with pertinent past medical history of recurrent tonsillar abscess, CHF, COPD, diabetes. The complaint involves an extensive differential diagnosis and also carries with it a high risk of complications and morbidity.    Additional history obtained from family. I have also reviewed previous admission documents  The differential diagnosis includes peritonsillar abscess, dental abscess, CHF exacerbation, COPD exacerbation, pneumonia  The initial management included labs, IV, cardiac monitor, chest x-ray and CT neck with IV contrast, antibiotics, blood pressure management.    Reassessments:  The following labs were independently interpreted: CBC and BMP significant for glucose of 297.  LFTs, magnesium , troponin, brain atretic peptide pending.  Rapid strep is negative.  I independently visualized the following imaging with scope of interpretation limited to determining acute life threatening conditions related to emergency care: Chest x-ray and CT neck with IV contrast pending.    Consideration for admission or further workup: Likely warrants admission pending  results and reassessment  Social Determinants of health: None  Final diagnoses:  None    ED Discharge Orders     None          Fredia Rosette Kirsch, MD 07/03/24 1538  "

## 2024-07-03 NOTE — Progress Notes (Addendum)
 Pharmacy Antibiotic Note  KALANDRA MASTERS is a 49 y.o. female admitted on 07/03/2024 with peritonsilar abscess.  She was treated with augmentin  outpatient and presented with increased pain. Pharmacy has been consulted for vancomycin  and unasyn  dosing.  Plan: Vancomycin  2500mg  IV x1 Vancomycin  2500mg  IV q24h (eAUC 491, Vd 0.5, Scr 0.8) Unasyn  3g IV q8h     Temp (24hrs), Avg:98.3 F (36.8 C), Min:97.8 F (36.6 C), Max:98.7 F (37.1 C)  Recent Labs  Lab 07/03/24 0848  WBC 6.6  CREATININE 0.69    CrCl cannot be calculated (Unknown ideal weight.).    Allergies[1]  Antimicrobials this admission: 1/13 Unasyn  3g x1   Microbiology results: 1/13 MRSA PCR: pending  Thank you for allowing pharmacy to be a part of this patients care.  Elma Fail, PharmD PGY1 Clinical Pharmacist [facility]  07/03/2024 5:28 PM      [1] Not on File

## 2024-07-03 NOTE — ED Notes (Signed)
 Dietary called, requested patient a house tray. Tray to be delivered around 1930. Pt states she has been urinating from the lasix . Pt given a bedside commode. Pt reported being hot. Bedside mini fan given to patient.

## 2024-07-04 ENCOUNTER — Inpatient Hospital Stay (HOSPITAL_COMMUNITY)

## 2024-07-04 ENCOUNTER — Other Ambulatory Visit (HOSPITAL_COMMUNITY): Payer: Self-pay

## 2024-07-04 DIAGNOSIS — I5023 Acute on chronic systolic (congestive) heart failure: Secondary | ICD-10-CM

## 2024-07-04 DIAGNOSIS — I5031 Acute diastolic (congestive) heart failure: Secondary | ICD-10-CM | POA: Diagnosis not present

## 2024-07-04 DIAGNOSIS — J36 Peritonsillar abscess: Secondary | ICD-10-CM | POA: Diagnosis not present

## 2024-07-04 DIAGNOSIS — I5043 Acute on chronic combined systolic (congestive) and diastolic (congestive) heart failure: Secondary | ICD-10-CM

## 2024-07-04 DIAGNOSIS — R9431 Abnormal electrocardiogram [ECG] [EKG]: Secondary | ICD-10-CM | POA: Insufficient documentation

## 2024-07-04 LAB — BASIC METABOLIC PANEL WITH GFR
Anion gap: 11 (ref 5–15)
BUN: 8 mg/dL (ref 6–20)
CO2: 25 mmol/L (ref 22–32)
Calcium: 10.1 mg/dL (ref 8.9–10.3)
Chloride: 106 mmol/L (ref 98–111)
Creatinine, Ser: 0.73 mg/dL (ref 0.44–1.00)
GFR, Estimated: >60 mL/min
Glucose, Bld: 286 mg/dL — ABNORMAL HIGH (ref 70–99)
Potassium: 3.8 mmol/L (ref 3.5–5.1)
Sodium: 142 mmol/L (ref 135–145)

## 2024-07-04 LAB — CBC
HCT: 41.4 % (ref 36.0–46.0)
Hemoglobin: 13.4 g/dL (ref 12.0–15.0)
MCH: 27.2 pg (ref 26.0–34.0)
MCHC: 32.4 g/dL (ref 30.0–36.0)
MCV: 84 fL (ref 80.0–100.0)
Platelets: 189 K/uL (ref 150–400)
RBC: 4.93 MIL/uL (ref 3.87–5.11)
RDW: 14.3 % (ref 11.5–15.5)
WBC: 6.9 K/uL (ref 4.0–10.5)
nRBC: 0 % (ref 0.0–0.2)

## 2024-07-04 LAB — ECHOCARDIOGRAM COMPLETE
AR max vel: 2.45 cm2
AV Area VTI: 2.78 cm2
AV Area mean vel: 2.7 cm2
AV Mean grad: 4 mmHg
AV Peak grad: 8.5 mmHg
Ao pk vel: 1.46 m/s
Area-P 1/2: 6.54 cm2
Calc EF: 46.3 %
P 1/2 time: 91 ms
S' Lateral: 3.9 cm
Single Plane A2C EF: 54 %
Single Plane A4C EF: 41.8 %
Weight: 4317.49 [oz_av]

## 2024-07-04 LAB — MRSA NEXT GEN BY PCR, NASAL: MRSA by PCR Next Gen: NOT DETECTED

## 2024-07-04 LAB — TSH: TSH: 2.75 u[IU]/mL (ref 0.350–4.500)

## 2024-07-04 LAB — LIPID PANEL
Cholesterol: 204 mg/dL — ABNORMAL HIGH (ref 0–200)
HDL: 45 mg/dL
LDL Cholesterol: 129 mg/dL — ABNORMAL HIGH (ref 0–99)
Total CHOL/HDL Ratio: 4.5 ratio
Triglycerides: 151 mg/dL — ABNORMAL HIGH
VLDL: 30 mg/dL (ref 0–40)

## 2024-07-04 LAB — GLUCOSE, CAPILLARY
Glucose-Capillary: 250 mg/dL — ABNORMAL HIGH (ref 70–99)
Glucose-Capillary: 196 mg/dL — ABNORMAL HIGH (ref 70–99)
Glucose-Capillary: 121 mg/dL — ABNORMAL HIGH (ref 70–99)
Glucose-Capillary: 194 mg/dL — ABNORMAL HIGH (ref 70–99)

## 2024-07-04 LAB — HEMOGLOBIN A1C
Hgb A1c MFr Bld: 11.9 % — ABNORMAL HIGH (ref 4.8–5.6)
Mean Plasma Glucose: 294.83 mg/dL

## 2024-07-04 LAB — HIV ANTIBODY (ROUTINE TESTING W REFLEX): HIV Screen 4th Generation wRfx: NONREACTIVE

## 2024-07-04 MED ORDER — ATORVASTATIN CALCIUM 40 MG PO TABS
40.0000 mg | ORAL_TABLET | Freq: Every day | ORAL | Status: DC
  Administered 2024-07-04 – 2024-07-06 (×3): 40 mg via ORAL
  Filled 2024-07-04 (×3): qty 1

## 2024-07-04 MED ORDER — INSULIN ASPART 100 UNIT/ML IJ SOLN
0.0000 [IU] | INTRAMUSCULAR | Status: DC
  Administered 2024-07-04: 7 [IU] via SUBCUTANEOUS
  Administered 2024-07-04: 4 [IU] via SUBCUTANEOUS
  Administered 2024-07-04: 3 [IU] via SUBCUTANEOUS
  Administered 2024-07-05: 7 [IU] via SUBCUTANEOUS
  Administered 2024-07-05: 4 [IU] via SUBCUTANEOUS
  Administered 2024-07-05: 15 [IU] via SUBCUTANEOUS
  Administered 2024-07-05: 4 [IU] via SUBCUTANEOUS
  Administered 2024-07-05: 11 [IU] via SUBCUTANEOUS
  Administered 2024-07-06: 4 [IU] via SUBCUTANEOUS
  Administered 2024-07-06 (×2): 3 [IU] via SUBCUTANEOUS
  Filled 2024-07-04: qty 4
  Filled 2024-07-04: qty 2
  Filled 2024-07-04: qty 4
  Filled 2024-07-04: qty 7
  Filled 2024-07-04: qty 3
  Filled 2024-07-04: qty 4
  Filled 2024-07-04: qty 3
  Filled 2024-07-04: qty 1
  Filled 2024-07-04: qty 15
  Filled 2024-07-04: qty 4
  Filled 2024-07-04: qty 11

## 2024-07-04 MED ORDER — IRBESARTAN 300 MG PO TABS: 300.0000 mg | ORAL_TABLET | Freq: Every day | ORAL | Status: DC

## 2024-07-04 MED ORDER — LIVING WELL WITH DIABETES BOOK
Freq: Once | Status: AC
  Filled 2024-07-04: qty 1

## 2024-07-04 MED ORDER — POLYETHYLENE GLYCOL 3350 17 G PO PACK
17.0000 g | PACK | Freq: Every day | ORAL | Status: DC
  Filled 2024-07-04 (×2): qty 1

## 2024-07-04 MED ORDER — FUROSEMIDE 10 MG/ML IJ SOLN
40.0000 mg | Freq: Once | INTRAMUSCULAR | Status: AC
  Administered 2024-07-04: 40 mg via INTRAVENOUS
  Filled 2024-07-04: qty 4

## 2024-07-04 MED ORDER — SENNA 8.6 MG PO TABS
2.0000 | ORAL_TABLET | Freq: Every day | ORAL | Status: DC
  Administered 2024-07-04 – 2024-07-05 (×2): 17.2 mg via ORAL
  Filled 2024-07-04 (×2): qty 2

## 2024-07-04 MED ORDER — METOPROLOL SUCCINATE ER 50 MG PO TB24
50.0000 mg | ORAL_TABLET | Freq: Every day | ORAL | Status: DC
  Administered 2024-07-04 – 2024-07-06 (×3): 50 mg via ORAL
  Filled 2024-07-04 (×3): qty 1

## 2024-07-04 MED ORDER — MELATONIN 3 MG PO TABS
3.0000 mg | ORAL_TABLET | Freq: Once | ORAL | Status: AC
  Administered 2024-07-04: 3 mg via ORAL
  Filled 2024-07-04: qty 1

## 2024-07-04 MED ORDER — SODIUM CHLORIDE 0.9 % IV SOLN
3.0000 g | Freq: Four times a day (QID) | INTRAVENOUS | Status: DC
  Administered 2024-07-04 – 2024-07-05 (×5): 3 g via INTRAVENOUS
  Filled 2024-07-04 (×5): qty 8

## 2024-07-04 MED ORDER — SPIRONOLACTONE 25 MG PO TABS
25.0000 mg | ORAL_TABLET | Freq: Every day | ORAL | Status: DC
  Administered 2024-07-04 – 2024-07-06 (×3): 25 mg via ORAL
  Filled 2024-07-04 (×3): qty 1

## 2024-07-04 MED ORDER — GLIPIZIDE 5 MG PO TABS
5.0000 mg | ORAL_TABLET | Freq: Two times a day (BID) | ORAL | Status: DC
  Administered 2024-07-04 – 2024-07-05 (×2): 5 mg via ORAL
  Filled 2024-07-04 (×2): qty 1

## 2024-07-04 MED ORDER — ENOXAPARIN SODIUM 60 MG/0.6ML IJ SOSY
60.0000 mg | PREFILLED_SYRINGE | INTRAMUSCULAR | Status: DC
  Administered 2024-07-04 – 2024-07-05 (×2): 60 mg via SUBCUTANEOUS
  Filled 2024-07-04 (×2): qty 0.6

## 2024-07-04 MED ORDER — IRBESARTAN 300 MG PO TABS
300.0000 mg | ORAL_TABLET | Freq: Every day | ORAL | Status: DC
  Administered 2024-07-05 – 2024-07-06 (×2): 300 mg via ORAL
  Filled 2024-07-04 (×2): qty 1

## 2024-07-04 NOTE — Assessment & Plan Note (Signed)
 A1C 11.9 on admission. BG 286 this morning w/ BMP - Starting Q4H CBG before meal - Starting SSI resistant  - Starting glipiZIDE  5 mg BID before meal  - Hold home Metformin 

## 2024-07-04 NOTE — Assessment & Plan Note (Signed)
 Completed 14 days of Augmentin . CT neck on admission shows improving tonsillitis w/ remain 1 cm of Right peritonsillar abscess. Her WBC has been WNL. No fever since admission.  - Continue UNASYN  3 g IV with inc frequency to Q6H (1/13 -  ) then switched back to Augmentin  likely tomorrow  - MRSA negative. Discontinue Vancomycin   - Will reach out to ENT if her symptoms become worsening.  - AM CBC - Pain : Tylenol  650 mg Q6H PRN Oxycodone  5 mg Q6H PRN  - Bowel Regimen : MiraLax  17g Daily , Senna 2 tab at bedtime - PT/OT consulted and follow

## 2024-07-04 NOTE — Assessment & Plan Note (Signed)
 Elevated BP and HR this morning likely abscess pain related - Continue home Losartan  50 mg daily and Norvasc  10 Daily

## 2024-07-04 NOTE — Plan of Care (Signed)
   Problem: Education: Goal: Knowledge of General Education information will improve Description Including pain rating scale, medication(s)/side effects and non-pharmacologic comfort measures Outcome: Progressing   Problem: Health Behavior/Discharge Planning: Goal: Ability to manage health-related needs will improve Outcome: Progressing

## 2024-07-04 NOTE — Progress Notes (Signed)
 "    Daily Progress Note Intern Pager: [phone]  Patient name: Julie [name] Medical record number: [id number] Date of birth: [birth year] Age: 49 y.o. Gender: female  Primary Care Provider: Pcp, No Consultants: None Code Status: Full Code   Pt Overview and Major Events to Date:  07/03/24 : Admitted   Julie [name] is a 49 YO female with PMH of Asthma, DMII, MDD, Cocaine abuse, CHF, HTN, Cigarette smoking, GAD, and PTSD who recently admitted from 06/17/24 - 06/18/24 for peritonsillar abscess which was drained by ENT in the ED. She present with worsening Voice change, SOB, inc HR, and elevated BP.  Assessment & Plan Peritonsillar abscess Completed 14 days of Augmentin . CT neck on admission shows improving tonsillitis w/ remain 1 cm of Right peritonsillar abscess. Her WBC has been WNL. No fever since admission.  - Continue UNASYN  3 g IV with inc frequency to Q6H (1/13 -  ) then switched back to Augmentin  likely tomorrow  - MRSA negative. Discontinue Vancomycin   - Will reach out to ENT if her symptoms become worsening.  - AM CBC - Pain : Tylenol  650 mg Q6H PRN Oxycodone  5 mg Q6H PRN  - Bowel Regimen : MiraLax  17g Daily , Senna 2 tab at bedtime - PT/OT consulted and follow  Acute exacerbation of CHF (congestive heart failure) (HCC) Dyspnea Worsening SOB OVN. Received Lasix  40 mg PO in ED once then IV Lasix  40 mg x2  in addition to home dose. Weight 122.4 kg this morning I/O -650 ml - Pending repeat Echo today - Continue Lasix  20 mg PO daily - Continue albuterol  neb Q6H PRN  - Daily weight - Strict I&O  - Continue Pulse sat Essential hypertension Elevated BP and HR this morning likely abscess pain related - Continue home Losartan  50 mg daily and Norvasc  10 Daily  T2DM (type 2 diabetes mellitus) (HCC) A1C 11.9 on admission. BG 286 this morning w/ BMP - Starting Q4H CBG before meal - Starting SSI resistant  - Starting glipiZIDE  5 mg BID before meal  - Hold home Metformin   Prolonged  Q-T interval on ECG QTCb 540 on admission 497 last admission  - Continue telemetry  - Continue to monitor - Avoid medication w/ inc QT - AM BMP w/ Mg  Chronic health problem MDD - continue sertraline  50 mg daily, ResperiDONE 0.5 mg QHS Asthma - continue home albuterol , anoro ellipta  PRN  FEN/GI: Carb modified diet  PPx: Lovenox   Dispo:Pending clinical improvement   Subjective:  Respiratory and voice improve this AM. Denies neck pain. State she is feeling better  Objective: Temp:  [98.5 F (36.9 C)-99.2 F (37.3 C)] 98.5 F (36.9 C) (01/14 0812) Pulse Rate:  [95-116] 95 (01/14 0812) Resp:  [16-33] 16 (01/14 0812) BP: (152-224)/(80-193) 152/80 (01/14 0812) SpO2:  [92 %-100 %] 96 % (01/14 0812) Weight:  [122.4 kg] 122.4 kg (01/14 0500)  Physical Exam Constitutional:      Appearance: She is well-developed.  HENT:     Right Ear: Right ear tenderness: w/ palpation.  Neck:     Comments: Tenderness w/ palpation Cardiovascular:     Rate and Rhythm: Regular rhythm. Tachycardia present.  Pulmonary:     Breath sounds: Rales present.  Abdominal:     Palpations: Abdomen is soft.  Skin:    Capillary Refill: Capillary refill takes less than 2 seconds.  Neurological:     Mental Status: She is alert and oriented to person, place, and time.  Psychiatric:  Mood and Affect: Mood normal.        Behavior: Behavior normal.     Laboratory: Most recent CBC Lab Results  Component Value Date   WBC 6.9 07/04/2024   HGB 13.4 07/04/2024   HCT 41.4 07/04/2024   MCV 84.0 07/04/2024   PLT 189 07/04/2024   Most recent BMP    Latest Ref Rng & Units 07/04/2024    5:20 AM  BMP  Glucose 70 - 99 mg/dL 713   BUN 6 - 20 mg/dL 8   Creatinine 9.55 - 8.99 mg/dL 9.26   Sodium 864 - 854 mmol/L 142   Potassium 3.5 - 5.1 mmol/L 3.8   Chloride 98 - 111 mmol/L 106   CO2 22 - 32 mmol/L 25   Calcium  8.9 - 10.3 mg/dL 89.8     EKG Vent. rate 114 BPM PR interval 116 ms QRS duration 121 ms  QT/QTcB 392/540 ms P-R-T axes 4 -65 124  Imaging/Diagnostic Tests: EXAM: CT NECK WITH CONTRAST 07/03/2024 02:22:00 PM  IMPRESSION: 1. Overall improved appearance of tonsillitis and a right peritonsillar abscess with a 1 cm collection remaining. 2. No new abnormality identified in the neck.  Julie Houston NOVAK, DO 07/04/2024, 12:48 PM  PGY-1, [facility] FPTS Intern pager: [phone], text pages welcome Secure chat group [facility] Teaching Service   "

## 2024-07-04 NOTE — Assessment & Plan Note (Signed)
 MDD - continue sertraline  50 mg daily, ResperiDONE 0.5 mg QHS Asthma - continue home albuterol , anoro ellipta  PRN

## 2024-07-04 NOTE — Consult Note (Addendum)
 "  Cardiology Consultation   Patient ID: Julie [name] MRN: [id number]; DOB: 05/18/1976  Admit date: 07/03/2024 Date of Consult: 07/04/2024  PCP:  Freddrick, No   [facility] Providers Cardiologist:  None        Patient Profile: Julie [name] is a 49 y.o. female with a hx of asthma, HTN, recent peritonsillar abscess who is being seen 07/04/2024 for the evaluation of CHF at the request of Dr. Anders.  History of Present Illness: Julie [name] is a 49 year old female with about medical history. Per chart review, patient has never before seen a cardiologist. She was admitted in 08/2023 with CHF. Echo at that time showed EF 50-55%, no regional wall motion abnormalities, normal RV systolic function, no significant valvular abnormalities. Patient was instructed to follow up with cardiology as an outpatient but failed to come to their appointment   Patient is currently homeless. Since 08/2023, she has been seen in the ED 21 times. Complaints included toenail infections, shortness of breath, asthma exacerbations, insect bites, suicidal ideation, chest pain, back pain. And  peritonsillar abscess.  Patient was recently admitted 12/29-12/29/25 with peritonsillar abscess. She had been diagnosed with this 18 days prior, unclear if she completed her course of antibiotics. Treated with antibiotics and discharged with PCP and ENT follow up   Patient presented to the ED on 1/13 with mouth pain. Also reported shortness of breath. proBNP 1200. hsTn 26>29. CXR showed no active disease. Of note, patient has been severely hypertensive with BP as high as 222/193.   Patient was admitted to family medicine teaching service. Given a dose of IV lasix  40 mg daily and started on PO lasix  20 mg daily. Also restarted on home amlodipine  10 mg daily and losartan  50 mg daily.   On exam, patient tells me that she came to the hospital primarily for her mouth pain. She also has had a persistent cough and was intermittently  short of breath. She should that her shortness of breath was because she was breathing more through her mouth than usual. She did not notice having any ankle edema or weight gain. Had a bit of chest pain when she would cough. This is reproducible on palpation. She was given a dose of lasix  40 mg yesterday and today. Reports having good urine output and feeling well after diuresis. She admits that she was not taking all of her medications prior to admission. She was mostly focused on taking her antibiotics for her peritonsillar abscess, and she was not taking any BP medications.    Past Medical History:  Diagnosis Date   Asthma    Diabetes mellitus without complication (HCC)    Fracture, humerus closed, shaft 07/11/2014   left   History of bronchitis    Mental health problem    Open fracture of great toe of left foot 12/25/2015    Past Surgical History:  Procedure Laterality Date   CESAREAN SECTION     ECTOPIC PREGNANCY SURGERY  2003   INCISION AND DRAINAGE Left 12/25/2015   Procedure: INCISION AND DRAINAGE With  Amputation of distal tip of Left Great and Second Toe.;  Surgeon: Kay CHRISTELLA Cummins, MD;  Location: [facility];  Service: Orthopedics;  Laterality: Left;   LIPOMA EXCISION     chest   ORIF HUMERUS FRACTURE Left 07/15/2014   Procedure: OPEN REDUCTION INTERNAL FIXATION (ORIF) LEFT HUMERAL SHAFT ;  Surgeon: Evalene JONETTA Chancy, MD;  Location: [facility];  Service: Orthopedics;  Laterality:  Left;   TUBAL LIGATION  2003     Scheduled Meds:  amLODipine   10 mg Oral Daily   atorvastatin   40 mg Oral Daily   enoxaparin  (LOVENOX ) injection  60 mg Subcutaneous Q24H   furosemide   20 mg Oral Daily   glipiZIDE   5 mg Oral BID AC   insulin  aspart  0-20 Units Subcutaneous Q4H   living well with diabetes book   Does not apply Once   losartan   50 mg Oral Daily   polyethylene glycol  17 g Oral Daily   risperiDONE   0.5 mg Oral QHS   senna  2 tablet Oral QHS   sertraline   50 mg Oral Daily    umeclidinium-vilanterol  1 puff Inhalation Daily   Continuous Infusions:  ampicillin -sulbactam (UNASYN ) IV 3 g (07/04/24 1332)   PRN Meds: acetaminophen , albuterol , oxyCODONE   Allergies:   Allergies[1]  Social History:   Social History   Socioeconomic History   Marital status: Single    Spouse name: Not on file   Number of children: Not on file   Years of education: Not on file   Highest education level: Not on file  Occupational History   Not on file  Tobacco Use   Smoking status: Every Day    Current packs/day: 0.50    Average packs/day: 0.5 packs/day for 18.0 years (9.0 ttl pk-yrs)    Types: Cigarettes   Smokeless tobacco: Never   Tobacco comments:    6 cig./day  Vaping Use   Vaping status: Never Used  Substance and Sexual Activity   Alcohol  use: Yes    Comment: occasionally   Drug use: Yes    Types: Marijuana, Crack cocaine, MDMA (Ecstacy)    Comment: Crack, THC, Molly   Sexual activity: Yes    Birth control/protection: I.U.D.  Other Topics Concern   Not on file  Social History Narrative   Not on file   Social Drivers of Health   Tobacco Use: High Risk (06/17/2024)   Patient History    Smoking Tobacco Use: Every Day    Smokeless Tobacco Use: Never    Passive Exposure: Not on file  Financial Resource Strain: Not on file  Food Insecurity: Food Insecurity Present (06/18/2024)   Epic    Worried About Programme Researcher, Broadcasting/film/video in the Last Year: Sometimes true    Barista in the Last Year: Often true  Transportation Needs: Unmet Transportation Needs (06/18/2024)   Epic    Lack of Transportation (Medical): Yes    Lack of Transportation (Non-Medical): Yes  Physical Activity: Not on file  Stress: Not on file  Social Connections: Not on file  Intimate Partner Violence: Not At Risk (06/18/2024)   Epic    Fear of Current or Ex-Partner: No    Emotionally Abused: No    Physically Abused: No    Sexually Abused: No  Depression (PHQ2-9): Not on file   Alcohol  Screen: Low Risk (03/02/2024)   Alcohol  Screen    Last Alcohol  Screening Score (AUDIT): 6  Housing: High Risk (06/18/2024)   Epic    Unable to Pay for Housing in the Last Year: Yes    Number of Times Moved in the Last Year: 4    Homeless in the Last Year: Yes  Utilities: Not At Risk (06/18/2024)   Epic    Threatened with loss of utilities: No  Health Literacy: Not on file    Family History:   No family history on file.  ROS:  Please see the history of present illness.   All other ROS reviewed and negative.     Physical Exam/Data: Vitals:   07/04/24 0305 07/04/24 0500 07/04/24 0812 07/04/24 1527  BP: (!) 160/108  (!) 152/80 (!) 151/91  Pulse:   95 97  Resp: 19  16 16   Temp: 99.2 F (37.3 C)  98.5 F (36.9 C) 98 F (36.7 C)  TempSrc:   Oral   SpO2: 92%  96% 98%  Weight:  122.4 kg      Intake/Output Summary (Last 24 hours) at 07/04/2024 1550 Last data filed at 07/04/2024 1159 Gross per 24 hour  Intake 740 ml  Output 1250 ml  Net -510 ml      07/04/2024    5:00 AM 06/18/2024   12:49 PM 06/17/2024    6:46 AM  Last 3 Weights  Weight (lbs) 269 lb 13.5 oz 278 lb 299 lb 13.2 oz  Weight (kg) 122.4 kg 126.1 kg 136 kg     Body mass index is 38.72 kg/m.  General:  Well nourished, well developed, in no acute distress. Sitting comfortably on the side of the bed  HEENT: normal Neck: no JVD Vascular: Radial pulses 2+ bilaterally Cardiac:  normal S1, S2; RRR; no murmur   Lungs:  coarse breath sounds throughout. Normal WOB on room air  Abd: soft, nontender  Ext: no edema in BLE Musculoskeletal:  No deformities  Skin: warm and dry  Neuro:  CNs 2-12 intact, no focal abnormalities noted Psych:  Normal affect   EKG:  The EKG was personally reviewed and demonstrates:  Sinus tachycardia with HR 114 BPM, TWI in leads I, aVL  Telemetry:  Telemetry was personally reviewed and demonstrates:  patient not on tele   Relevant CV Studies: Cardiac Studies & Procedures    ______________________________________________________________________________________________     ECHOCARDIOGRAM  ECHOCARDIOGRAM COMPLETE 07/04/2024  Narrative ECHOCARDIOGRAM REPORT    Patient Name:   Julie [name] Date of Exam: 07/04/2024 Medical Rec #:  [id number]        Height:       70.0 in Accession #:    [id number]       Weight:       269.8 lb Date of Birth:  1975/07/28         BSA:          2.371 m Patient Age:    48 years         BP:           152/80 mmHg Patient Gender: F                HR:           93 bpm. Exam Location:  Inpatient  Procedure: 2D Echo, Cardiac Doppler and Color Doppler (Both Spectral and Color Flow Doppler were utilized during procedure).  Indications:    CHF I50.31  History:        Patient has prior history of Echocardiogram examinations, most recent 08/22/2023. CHF.  Sonographer:    Nathanel Devonshire Referring Phys: [id number] OTTO T ENIOLA  IMPRESSIONS   1. Left ventricular ejection fraction, by estimation, is 30 to 35%. The left ventricle has moderately decreased function. The left ventricle demonstrates global hypokinesis. There is severe concentric left ventricular hypertrophy. Left ventricular diastolic parameters are consistent with Grade II diastolic dysfunction (pseudonormalization). Elevated left atrial pressure. 2. Right ventricular systolic function is normal. The right ventricular size is normal. 3. Left atrial size was mildly  dilated. 4. The mitral valve is normal in structure. Trivial mitral valve regurgitation. No evidence of mitral stenosis. 5. The aortic valve is tricuspid. Aortic valve regurgitation is mild. No aortic stenosis is present. 6. The inferior vena cava is normal in size with greater than 50% respiratory variability, suggesting right atrial pressure of 3 mmHg.  FINDINGS Left Ventricle: Left ventricular ejection fraction, by estimation, is 30 to 35%. The left ventricle has moderately decreased function. The left ventricle  demonstrates global hypokinesis. The left ventricular internal cavity size was normal in size. There is severe concentric left ventricular hypertrophy. Left ventricular diastolic parameters are consistent with Grade II diastolic dysfunction (pseudonormalization). Elevated left atrial pressure.  Right Ventricle: The right ventricular size is normal. Right ventricular systolic function is normal.  Left Atrium: Left atrial size was mildly dilated.  Right Atrium: Right atrial size was normal in size.  Pericardium: Trivial pericardial effusion is present.  Mitral Valve: The mitral valve is normal in structure. Trivial mitral valve regurgitation. No evidence of mitral valve stenosis.  Tricuspid Valve: The tricuspid valve is normal in structure. Tricuspid valve regurgitation is mild . No evidence of tricuspid stenosis.  Aortic Valve: The aortic valve is tricuspid. Aortic valve regurgitation is mild. Aortic regurgitation PHT measures 91 msec. No aortic stenosis is present. Aortic valve mean gradient measures 4.0 mmHg. Aortic valve peak gradient measures 8.5 mmHg. Aortic valve area, by VTI measures 2.78 cm.  Pulmonic Valve: The pulmonic valve was normal in structure. Pulmonic valve regurgitation is trivial. No evidence of pulmonic stenosis.  Aorta: The aortic root is normal in size and structure.  Venous: The inferior vena cava is normal in size with greater than 50% respiratory variability, suggesting right atrial pressure of 3 mmHg.  IAS/Shunts: The interatrial septum was not well visualized.   LEFT VENTRICLE PLAX 2D LVIDd:         5.20 cm      Diastology LVIDs:         3.90 cm      LV e' medial:    6.42 cm/s LV PW:         1.80 cm      LV E/e' medial:  15.7 LV IVS:        1.50 cm      LV e' lateral:   5.44 cm/s LVOT diam:     2.00 cm      LV E/e' lateral: 18.6 LV SV:         58 LV SV Index:   25 LVOT Area:     3.14 cm LV IVRT:       92 msec  LV Volumes (MOD) LV vol d, MOD A2C:  114.0 ml LV vol d, MOD A4C: 85.8 ml LV vol s, MOD A2C: 52.4 ml LV vol s, MOD A4C: 49.9 ml LV SV MOD A2C:     61.6 ml LV SV MOD A4C:     85.8 ml LV SV MOD BP:      46.3 ml  RIGHT VENTRICLE RV Basal diam:  3.30 cm RV S prime:     10.80 cm/s TAPSE (M-mode): 1.5 cm  LEFT ATRIUM             Index LA diam:        4.20 cm 1.77 cm/m LA Vol (A2C):   67.0 ml 28.26 ml/m LA Vol (A4C):   59.1 ml 24.93 ml/m LA Biplane Vol: 67.6 ml 28.51 ml/m AORTIC VALVE  PULMONIC VALVE AV Area (Vmax):    2.45 cm     PV Vmax:       0.98 m/s AV Area (Vmean):   2.70 cm     PV Peak grad:  3.8 mmHg AV Area (VTI):     2.78 cm AV Vmax:           146.00 cm/s AV Vmean:          93.000 cm/s AV VTI:            0.209 m AV Peak Grad:      8.5 mmHg AV Mean Grad:      4.0 mmHg LVOT Vmax:         114.00 cm/s LVOT Vmean:        79.900 cm/s LVOT VTI:          0.185 m LVOT/AV VTI ratio: 0.89 AI PHT:            91 msec  AORTA Ao Root diam: 2.70 cm Ao Asc diam:  3.50 cm  MITRAL VALVE                TRICUSPID VALVE MV Area (PHT): 6.54 cm     TR Peak grad:   31.8 mmHg MV Decel Time: 116 msec     TR Vmax:        282.00 cm/s MV E velocity: 101.00 cm/s MV A velocity: 77.60 cm/s   SHUNTS MV E/A ratio:  1.30         Systemic VTI:  0.18 m Systemic Diam: 2.00 cm  Redell Shallow MD Electronically signed by Redell Shallow MD Signature Date/Time: 07/04/2024/1:15:06 PM    Final          ______________________________________________________________________________________________       Laboratory Data: High Sensitivity Troponin:  No results for input(s): TROPONINIHS in the last 720 hours.  Recent Labs  Lab 06/17/24 2241 06/17/24 2330 07/03/24 1434 07/03/24 2055 07/03/24 2238  TRNPT 23* 25* 26* 29* 31*      Chemistry Recent Labs  Lab 07/03/24 0848 07/03/24 1434 07/04/24 0520  NA 141  --  142  K 3.9  --  3.8  CL 108  --  106  CO2 23  --  25  GLUCOSE 297*  --  286*  BUN 12   --  8  CREATININE 0.69  --  0.73  CALCIUM  10.2  --  10.1  MG  --  1.4*  --   GFRNONAA >60  --  >60  ANIONGAP 10  --  11    Recent Labs  Lab 07/03/24 1434  PROT 6.6  ALBUMIN 3.7  AST 34  ALT 38  ALKPHOS 64  BILITOT 0.3   Lipids  Recent Labs  Lab 07/04/24 0520  CHOL 204*  TRIG 151*  HDL 45  LDLCALC 129*  CHOLHDL 4.5    Hematology Recent Labs  Lab 07/03/24 0848 07/04/24 0520  WBC 6.6 6.9  RBC 4.78 4.93  HGB 12.9 13.4  HCT 42.2 41.4  MCV 88.3 84.0  MCH 27.0 27.2  MCHC 30.6 32.4  RDW 14.3 14.3  PLT 176 189   Thyroid   Recent Labs  Lab 07/04/24 0520  TSH 2.750    BNP Recent Labs  Lab 07/03/24 1434  PROBNP 1,200.0*    DDimer No results for input(s): DDIMER in the last 168 hours.  Radiology/Studies:  ECHOCARDIOGRAM COMPLETE Result Date: 07/04/2024    ECHOCARDIOGRAM REPORT   Patient Name:   Shamyah I  Cobb Date of Exam: 07/04/2024 Medical Rec #:  [id number]        Height:       70.0 in Accession #:    [id number]       Weight:       269.8 lb Date of Birth:  [birth year]         BSA:          2.371 m Patient Age:    48 years         BP:           152/80 mmHg Patient Gender: F                HR:           93 bpm. Exam Location:  Inpatient Procedure: 2D Echo, Cardiac Doppler and Color Doppler (Both Spectral and Color            Flow Doppler were utilized during procedure). Indications:    CHF I50.31  History:        Patient has prior history of Echocardiogram examinations, most                 recent 08/22/2023. CHF.  Sonographer:    Nathanel Devonshire Referring Phys: [id number] OTTO T ENIOLA IMPRESSIONS  1. Left ventricular ejection fraction, by estimation, is 30 to 35%. The left ventricle has moderately decreased function. The left ventricle demonstrates global hypokinesis. There is severe concentric left ventricular hypertrophy. Left ventricular diastolic parameters are consistent with Grade II diastolic dysfunction (pseudonormalization). Elevated left atrial pressure.  2. Right  ventricular systolic function is normal. The right ventricular size is normal.  3. Left atrial size was mildly dilated.  4. The mitral valve is normal in structure. Trivial mitral valve regurgitation. No evidence of mitral stenosis.  5. The aortic valve is tricuspid. Aortic valve regurgitation is mild. No aortic stenosis is present.  6. The inferior vena cava is normal in size with greater than 50% respiratory variability, suggesting right atrial pressure of 3 mmHg. FINDINGS  Left Ventricle: Left ventricular ejection fraction, by estimation, is 30 to 35%. The left ventricle has moderately decreased function. The left ventricle demonstrates global hypokinesis. The left ventricular internal cavity size was normal in size. There is severe concentric left ventricular hypertrophy. Left ventricular diastolic parameters are consistent with Grade II diastolic dysfunction (pseudonormalization). Elevated left atrial pressure. Right Ventricle: The right ventricular size is normal. Right ventricular systolic function is normal. Left Atrium: Left atrial size was mildly dilated. Right Atrium: Right atrial size was normal in size. Pericardium: Trivial pericardial effusion is present. Mitral Valve: The mitral valve is normal in structure. Trivial mitral valve regurgitation. No evidence of mitral valve stenosis. Tricuspid Valve: The tricuspid valve is normal in structure. Tricuspid valve regurgitation is mild . No evidence of tricuspid stenosis. Aortic Valve: The aortic valve is tricuspid. Aortic valve regurgitation is mild. Aortic regurgitation PHT measures 91 msec. No aortic stenosis is present. Aortic valve mean gradient measures 4.0 mmHg. Aortic valve peak gradient measures 8.5 mmHg. Aortic valve area, by VTI measures 2.78 cm. Pulmonic Valve: The pulmonic valve was normal in structure. Pulmonic valve regurgitation is trivial. No evidence of pulmonic stenosis. Aorta: The aortic root is normal in size and structure. Venous: The  inferior vena cava is normal in size with greater than 50% respiratory variability, suggesting right atrial pressure of 3 mmHg. IAS/Shunts: The interatrial septum was not well visualized.  LEFT VENTRICLE PLAX 2D LVIDd:  5.20 cm      Diastology LVIDs:         3.90 cm      LV e' medial:    6.42 cm/s LV PW:         1.80 cm      LV E/e' medial:  15.7 LV IVS:        1.50 cm      LV e' lateral:   5.44 cm/s LVOT diam:     2.00 cm      LV E/e' lateral: 18.6 LV SV:         58 LV SV Index:   25 LVOT Area:     3.14 cm LV IVRT:       92 msec  LV Volumes (MOD) LV vol d, MOD A2C: 114.0 ml LV vol d, MOD A4C: 85.8 ml LV vol s, MOD A2C: 52.4 ml LV vol s, MOD A4C: 49.9 ml LV SV MOD A2C:     61.6 ml LV SV MOD A4C:     85.8 ml LV SV MOD BP:      46.3 ml RIGHT VENTRICLE RV Basal diam:  3.30 cm RV S prime:     10.80 cm/s TAPSE (M-mode): 1.5 cm LEFT ATRIUM             Index LA diam:        4.20 cm 1.77 cm/m LA Vol (A2C):   67.0 ml 28.26 ml/m LA Vol (A4C):   59.1 ml 24.93 ml/m LA Biplane Vol: 67.6 ml 28.51 ml/m  AORTIC VALVE                    PULMONIC VALVE AV Area (Vmax):    2.45 cm     PV Vmax:       0.98 m/s AV Area (Vmean):   2.70 cm     PV Peak grad:  3.8 mmHg AV Area (VTI):     2.78 cm AV Vmax:           146.00 cm/s AV Vmean:          93.000 cm/s AV VTI:            0.209 m AV Peak Grad:      8.5 mmHg AV Mean Grad:      4.0 mmHg LVOT Vmax:         114.00 cm/s LVOT Vmean:        79.900 cm/s LVOT VTI:          0.185 m LVOT/AV VTI ratio: 0.89 AI PHT:            91 msec  AORTA Ao Root diam: 2.70 cm Ao Asc diam:  3.50 cm MITRAL VALVE                TRICUSPID VALVE MV Area (PHT): 6.54 cm     TR Peak grad:   31.8 mmHg MV Decel Time: 116 msec     TR Vmax:        282.00 cm/s MV E velocity: 101.00 cm/s MV A velocity: 77.60 cm/s   SHUNTS MV E/A ratio:  1.30         Systemic VTI:  0.18 m                             Systemic Diam: 2.00 cm Redell Shallow MD Electronically signed by Redell Shallow MD Signature Date/Time:  07/04/2024/1:15:06 PM  Final    DG CHEST PORT 1 VIEW Result Date: 07/03/2024 CLINICAL DATA:  Shortness of breath. EXAM: PORTABLE CHEST 1 VIEW COMPARISON:  Chest radiograph dated 07/03/2024. FINDINGS: Mild cardiomegaly with mild vascular congestion and edema. No focal consolidation, pleural effusion or pneumothorax. No acute osseous pathology. IMPRESSION: Mild cardiomegaly with mild vascular congestion and edema. Electronically Signed   By: Vanetta Chou M.D.   On: 07/03/2024 21:12   CT Soft Tissue Neck W Contrast Result Date: 07/03/2024 EXAM: CT NECK WITH CONTRAST 07/03/2024 02:22:00 PM TECHNIQUE: CT of the neck was performed with the administration of 75 mL of iohexol  (OMNIPAQUE ) 350 MG/ML injection. Multiplanar reformatted images are provided for review. Automated exposure control, iterative reconstruction, and/or weight based adjustment of the mA/kV was utilized to reduce the radiation dose to as low as reasonably achievable. COMPARISON: CT neck 06/17/2024. CLINICAL HISTORY: Epiglottitis or tonsillitis suspected. FINDINGS: AERODIGESTIVE TRACT: Moderate motion artifact through the pharynx and larynx. Decreased, mild to moderate residual asymmetric enlargement of the right palatine tonsil compared to the left. A right peritonsillar fluid collection on the prior study has nearly completely resolved, with a 10 x 4 mm collection remaining (series 3 image 43). Regional submucosal edema has completely or nearly completely resolved. No retropharyngeal fluid collection. No epiglottic swelling. Widely patent airway. SALIVARY GLANDS: The parotid and submandibular glands are unremarkable. THYROID : Unremarkable. LYMPH NODES: Persistent scattered subcentimeter short axis lymph nodes in the right neck, likely reactive. SOFT TISSUES: Unchanged 2 cm inclusion cyst in the anterior left upper chest wall. BONES: Numerous dental caries. Mild cervical spondylosis. No acute osseous abnormality. VASCULATURE: Unchanged poor  visualization of a likely markedly hypoplastic right vertebral artery with normal appearing, dominant left vertebral artery. OTHER: Progressive opacification of the left maxillary sinus, likely by a large mucous retention cyst. Small to moderate volume secretions in the right maxillary sinus with overall improved aeration. Clear mastoid air cells. Moderate motion artifact through the lung apices without evidence of consolidation or mass. IMPRESSION: 1. Overall improved appearance of tonsillitis and a right peritonsillar abscess with a 1 cm collection remaining. 2. No new abnormality identified in the neck. Electronically signed by: Dasie Hamburg MD 07/03/2024 03:20 PM EST RP Workstation: [id number]   DG Chest 1 View Result Date: 07/03/2024 CLINICAL DATA:  Shortness of breath EXAM: CHEST  1 VIEW COMPARISON:  June 17, 2024 FINDINGS: Stable cardiomegaly. Both lungs are clear. The visualized skeletal structures are unremarkable. IMPRESSION: No active disease. Electronically Signed   By: Lynwood Landy Raddle M.D.   On: 07/03/2024 15:11     Assessment and Plan:  Acute HFrEF  HTN  - Patient presented with mouth pain from peritonsillar abscess and shortness of breath. Pro BNP 1200. CXR without acute disease  - Echo this admission showed EF 30-35%, severe LVH, grade II DD, normal RV systolic function, mild AI  -  Suspect hypertensive cardiomyopathy  - BP was severely elevated in the ED- up to 224/127. Improving today  - She has received 2 doses of IV lasix - output 1.25 L urine yesterday. Renal function stable  - Patient appears euvolemic, though body status makes it difficult to assess. Follow BMP in AM. Consider redosing IV lasix  if renal function does not change  - Stop losartan  and start irbesartan  300 mg daily while admitted. Recommend valsartan  320 mg daily at discharge (not on formulary while admitted)  - Start metoprolol  succinate 50 mg daily  - Start spironolactone  25 mg daily  - Continue amlodipine   10 mg daily  -  Of note- patient is homeless. In the past, she has had difficultly affording her medications. Currently insured through medicaid with $4 copay. [facility] cost.   Otherwise per primary  - Peritonsillar abscess  - Asthma  - Type 2 DM  - MDD    Risk Assessment/Risk Scores:   For questions or updates, please contact [facility] Please consult [email or web address] for contact info under    Signed, Rollo FABIENE Louder, PA-C  07/04/2024 3:50 PM  I have seen and examined the patient along with Rollo FABIENE Louder, PA-C .  I have reviewed the chart, notes and new data.  I agree with PA/NP's note.  Key new complaints: Breathing and swallowing have improved.  Denies chest pain.  Good response to diuretics. Key examination changes: Remains hypertensive, but blood pressure substantially better compared to yesterday. Key new findings / data: So far with normal renal function and electrolytes.  Her ECG shows sinus rhythm and left anterior fascicular block, it does not show a left bundle branch block.  PLAN: Very difficult social situation which is in large part responsible for frequent emergency room visits, on average twice a month in the last year.  Most of these visits have been related to uncontrolled hypertension and consequent hypertensive cardiomyopathy with heart failure.    Will try to find a regimen of medication that can improve compliance (once daily dosing), provide maximum of benefit for heart failure and be low cost.  However she will require a minimum of 5 different medications so even with Medicaid for dollar co-pays, she does not think she can afford them.  Strongly recommend helping her obtain an orange card and referring her to the [facility] community care network.  Will use metoprolol  succinate (beta-1 selective due to possible history of asthma), a more potent and longer acting angiotensin receptor blocker (preferably  valsartan  to allow easiest future transition to sacubitril -valsartan  if available), spironolactone , direct vasodilator (amlodipine ; although BiDil would theoretically be of much greater benefit, it is unlikely she will take a 3 times daily medication).  In addition to this she will need to take a daily loop diuretic.  She will also receive an SGLT2 inhibitor like Farxiga  or Jardiance which will help with both blood glucose control and heart failure.  Will follow along to titrate the doses of medications.  If we can somehow ensure access to medications and compliance with this medical regimen I think is highly likely that we will see substantial improvement in LV function after a few months.  Jerel Balding, MD, [facility] [phone] 07/04/2024, 5:01 PM      [1] Not on File  "

## 2024-07-04 NOTE — Assessment & Plan Note (Signed)
 QTCb 540 on admission 497 last admission  - Continue telemetry  - Continue to monitor - Avoid medication w/ inc QT - AM BMP w/ Mg

## 2024-07-04 NOTE — Evaluation (Signed)
 Physical Therapy Evaluation and Discharge Patient Details Name: Julie [name] MRN: [id number] DOB: 08/21/1975 Today's Date: 07/04/2024  History of Present Illness  49 y.o. presents 07/03/24 with worsening jaw pain and dyspnea. CT neck with overall improved appearance of tonsillitis and a right peritonsillar abscess with a 1 cm collection remaining. No new abnormality identified in the neck. PMHx: asthma and DM2. Of note, prior admission 12/28-29/25 for peritonsillar abscess.   Clinical Impression  Pt greeted seated in recliner chair, agreeable to PT evaluation with moderate encouragement. PTA, pt was independent with functional mobility and ADLs. She reports occasional assistance to transfer in/out of the shower from her friend. 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 She is currently residing in a ground floor motel room. She performed transfers with modI and require supervision for functional mobility. Pt ambulated ~241ft with a mild antalgic gait pattern. She was slightly unsteady and SOB with SpO2 87-89% cued PLB technique with recovery >92% SpO2. Pt reports she feels like her normal self. She appears to be near her baseline function, no further skilled PT needs identified. Encouraged OOB<>chair 3x/day and mobilization with staff assistance.       If plan is discharge home, recommend the following: A little help with walking and/or transfers;Assist for transportation;Help with stairs or ramp for entrance   Can travel by private vehicle        Equipment Recommendations None recommended by PT  Recommendations for Other Services       Functional Status Assessment Patient has not had a recent decline in their functional status     Precautions / Restrictions Precautions Precautions: Fall Recall of Precautions/Restrictions: Intact Restrictions Weight Bearing Restrictions Per Provider Order: No      Mobility  Bed Mobility               General  bed mobility comments: Not assessed. Pt greeted seated in recliner chair and returned there at end of session.    Transfers Overall transfer level: Modified independent Equipment used: None               General transfer comment: Pt stood from recliner chair pushing up with BUE support. Good eccentric control. Observed pt walk out of room to nursing station and back earlier this morning.    Ambulation/Gait Ambulation/Gait assistance: Supervision, Modified independent (Device/Increase time) Gait Distance (Feet): 250 Feet Assistive device: None Gait Pattern/deviations: Step-through pattern, Decreased stride length, Decreased weight shift to left, Antalgic Gait velocity: reduced Gait velocity interpretation: <1.8 ft/sec, indicate of risk for recurrent falls   General Gait Details: Pt ambulated with a mild antalgic gait pattern d/t L knee pain. She demonstrated adequate foot clearence. She was slightly unsteady, but no overt LOB. SpO2 87-89%, cued PLB technique and recovered >92%.  Stairs Stairs:  (Pt declined stair training, reporting she doesn't interact with steps)          Wheelchair Mobility     Tilt Bed    Modified Rankin (Stroke Patients Only)       Balance Overall balance assessment: Mild deficits observed, not formally tested                                           Pertinent Vitals/Pain Pain Assessment Pain Assessment: Faces Faces Pain Scale: Hurts little more Pain Location: Jaw & L knee Pain Descriptors / Indicators:  Discomfort, Nagging, Aching Pain Intervention(s): Monitored during session, Repositioned    Home Living Family/patient expects to be discharged to:: Shelter/Homeless                   Additional Comments: Pt has been staying in a ground floor motel room with a tub/shower.    Prior Function Prior Level of Function : Independent/Modified Independent             Mobility Comments: Ambulates without an AD.  Reports a few falls in the past 47mo, d/t left knee buckling. 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 ADLs Comments: Indep to modI for ADLs. She reports occassional light assistance from friend to step into the shower. She reports using a trashcan by the bed as a BSC.     Extremity/Trunk Assessment   Upper Extremity Assessment Upper Extremity Assessment: Defer to OT evaluation    Lower Extremity Assessment Lower Extremity Assessment: Generalized weakness    Cervical / Trunk Assessment Cervical / Trunk Assessment: Normal  Communication   Communication Communication: No apparent difficulties    Cognition Arousal: Alert Behavior During Therapy: Lability   PT - Cognitive impairments: No family/caregiver present to determine baseline                       PT - Cognition Comments: Pt initially upset and agitated by need to mobiliize with PT. She threw her phone across the couch. Pt then agreeable, pleasant, joking. She c/o her room being very busy this morning which RN confirmed. Pt stated she is fearful, but was unable to elaborate on what she was scared of. Following commands: Intact       Cueing Cueing Techniques: Verbal cues     General Comments General comments (skin integrity, edema, etc.): VSS on RA    Exercises     Assessment/Plan    PT Assessment Patient does not need any further PT services  PT Problem List         PT Treatment Interventions      PT Goals (Current goals can be found in the Care Plan section)  Acute Rehab PT Goals PT Goal Formulation: All assessment and education complete, DC therapy    Frequency       Co-evaluation               AM-PAC PT 6 Clicks Mobility  Outcome Measure Help needed turning from your back to your side while in a flat bed without using bedrails?: None Help needed moving from lying on your back to sitting on the side of a flat bed without using bedrails?: None Help needed  moving to and from a bed to a chair (including a wheelchair)?: None Help needed standing up from a chair using your arms (e.g., wheelchair or bedside chair)?: None Help needed to walk in hospital room?: None Help needed climbing 3-5 steps with a railing? : A Little 6 Click Score: 23    End of Session Equipment Utilized During Treatment: Gait belt Activity Tolerance: Patient tolerated treatment well Patient left: in chair;with call bell/phone within reach Nurse Communication: Mobility status;Other (comment) (no chair alarm in room, pt's emotional lability, slight desat with activity but recovered with PLB) PT Visit Diagnosis: Other abnormalities of gait and mobility (R26.89)    Time: 8851-8841 PT Time Calculation (min) (ACUTE ONLY): 10 min   Charges:   PT Evaluation $PT Eval Low Complexity: 1 Low   PT General  Charges $$ ACUTE PT VISIT: 1 Visit         Randall SAUNDERS, PT, DPT Acute Rehabilitation Services Office: [phone] Secure Chat Preferred  Delon CHRISTELLA Callander 07/04/2024, 1:06 PM

## 2024-07-04 NOTE — Hospital Course (Addendum)
 Julie [name] is a 49 y.o.female with a history of MDD, T2DM, asthma, HTN who was admitted to the [facility] Medicine Teaching Service at [facility] for pain, shortness of breath. Her hospital course is detailed below:  Right jaw swelling and pain  Patient was previously admitted 12/28 to 12/29 for peritonsillar abscess, completed full course of Augmentin .  Pain recently began again in her jaw.  She was started on Unasyn  (1/13- 07/05/24). Received 1 dose of Vancomycin  IV in ED. CT soft tissue neck on admission showed improved appearance of tonsillitis and 1 cm right peritonsillar abscess.  She was transition back to Augmentin  TID for the total of 14 days antibiotic treatment.   CHF exacerbation In ED her proBNP was elevated w/ unremarkable CXR however subsequent chest x-ray concerning for mild vascular congestion and edema. Lasix  IV was given. Echo on 07/04/24 shows worsening cardiac fx w. LVEF of 30- 35% compare to 50-55% last March w/ global hypokinesis and LV hypertrophy likely hypertensive cardiomyopathy. Cardiology was consult which recommended stopping Losartan  and starting Irbesartan  300 mg daily while inpatient then switched to Entresto  49-51 mg twice daily at discharge, Starting Metoprolol  succinate 50 mg which switch to carvedilol  twice a day at discharge, Spironolactone  25 mg, and Continue amlodipine  10 mg daily   DMII A1C on admission was 11.9. We started her on  glipiZIDE  5 then increase to 10 mg BID before meal with LAI while hold her home Metformin . We start her back on her Metformin  w/o insulin  for discharge   Other chronic conditions were medically managed with home medications and formulary alternatives as necessary   PCP Follow-up Recommendations: Follow up with ENT Cardiology f/u If able, consider monthly clinic visit to obtain insulin  pen which will be safe at room temp for 28 days. Consider magnesium  supplementation (required significant replacement in hospital).

## 2024-07-04 NOTE — Inpatient Diabetes Management (Signed)
 Inpatient Diabetes Program Recommendations  AACE/ADA: New Consensus Statement on Inpatient Glycemic Control   Target Ranges:  Prepandial:   less than 140 mg/dL      Peak postprandial:   less than 180 mg/dL (1-2 hours)      Critically ill patients:  140 - 180 mg/dL   Lab Results  Component Value Date   GLUCAP 250 (H) 07/04/2024   HGBA1C 11.9 (H) 07/04/2024    Latest Reference Range & Units 07/04/24 11:49  Glucose-Capillary 70 - 99 mg/dL 749 (H)   Review of Glycemic Control  Diabetes history: DM2  Outpatient Diabetes medications:  Metformin  500mg  BID (Not Taking)  Current orders for Inpatient glycemic control:  Glipizide  5mg  BID  Novolog  0-20 Q4HRS   Inpatient Diabetes Program Recommendations:   Spoke with patient at bedside. Patient reports not being followed by anyone outpatient and not currently taking any medication outpatient for diabetes control. Patient reports not having a gluco-meter at home, therefore she does not check her glucose. Patient states she is homeless. However, she works everyday and uses her money to pay for a hotel room every night. Inquired about prior A1C and patient reports not being able to recall last A1C value. Discussed A1C results 11.9% and explained that current A1C indicates an average glucose of 295 mg/dl over the past 2-3 months. Discussed glucose and A1C goals. Discussed importance of checking CBGs and maintaining good CBG control to prevent long-term and short-term complications. Stressed to the patient the importance of improving glycemic control to prevent further complications from uncontrolled diabetes. Discussed impact of nutrition, stress, sickness, and medications on diabetes control.  Discussed carbohydrates, carbohydrate goals per day and meal, along with portion sizes. Patient states she eats mostly fast food and drinks sweet tea/lemonade. Educated patient on choosing diet or sugar free options or water and still limiting carbohydrates or  choosing healthier options at fast food restaurants if that is her only option.  Encouraged patient to check glucose 2-3 times per day and to keep a log book of glucose readings and DM medication taken which patient will need to take to doctor appointments. Explained how the doctor can use the log book to continue to make adjustments with DM medications if needed. Patient verbalized understanding of information discussed and reports no further questions at this time related to diabetes.   - Living Well with Diabetes book ordered - TOC consult placed   Patient will need Rx for Gluco-meter and supplies at discharge Order #[id number]  Thanks,  Lavanda Search, RN, MSN, [facility]  Inpatient Diabetes Coordinator  Pager [phone] (8a-5p)

## 2024-07-04 NOTE — Assessment & Plan Note (Signed)
 Worsening SOB OVN. Received Lasix  40 mg PO in ED once then IV Lasix  40 mg x2  in addition to home dose. Weight 122.4 kg this morning I/O -650 ml - Pending repeat Echo today - Continue Lasix  20 mg PO daily - Continue albuterol  neb Q6H PRN  - Daily weight - Strict I&O  - Continue Pulse sat

## 2024-07-04 NOTE — Evaluation (Signed)
 Occupational Therapy Evaluation Patient Details Name: Julie [name] MRN: [id number] DOB: [birth year] Today's Date: 07/04/2024   History of Present Illness   49 y.o. presents 07/03/24 with worsening jaw pain and dyspnea. Previous admission 06/17/24-06/18/24 for peritonsillar abscess. PMH: asthma and DM2.     Clinical Impressions PTA Pt reports occasional assistance with functional transfers from friend, otherwise independent with mobility and ADL tasks. Pt currently requires supervision for functional mobility for safety and up to Min A for ADL tasks. Pt primarily limited by decreased activity tolerance, pain, unsteadiness on feet, and generalized weakness. OT will continue to follow Pt acutely to facilitate progress towards goals, anticipate that Pt can return to home living with level of assist as outlined below without OT follow up once medically cleared for d/c.      If plan is discharge home, recommend the following:   A little help with walking and/or transfers;A little help with bathing/dressing/bathroom;Assistance with cooking/housework;Help with stairs or ramp for entrance;Assist for transportation     Functional Status Assessment   Patient has had a recent decline in their functional status and demonstrates the ability to make significant improvements in function in a reasonable and predictable amount of time.     Equipment Recommendations   BSC/3in1     Recommendations for Other Services         Precautions/Restrictions   Precautions Precautions: Fall Recall of Precautions/Restrictions: Intact Restrictions Weight Bearing Restrictions Per Provider Order: No     Mobility Bed Mobility Overal bed mobility: Modified Independent             General bed mobility comments: No assistance required    Transfers Overall transfer level: Needs assistance Equipment used: None Transfers: Sit to/from Stand Sit to Stand: Supervision           General  transfer comment: Close supervision for safety. Increased time required for rise from bed. Close supervision to ambulate ~10 feet. Mild unsteadiness noted      Balance Overall balance assessment: Mild deficits observed, not formally tested                                         ADL either performed or assessed with clinical judgement   ADL Overall ADL's : Needs assistance/impaired Eating/Feeding: Independent   Grooming: Supervision/safety;Standing   Upper Body Bathing: Independent;Sitting   Lower Body Bathing: Minimal assistance;Sitting/lateral leans   Upper Body Dressing : Independent;Sitting   Lower Body Dressing: Minimal assistance;Sit to/from stand   Toilet Transfer: Supervision/safety;BSC/3in1   Toileting- Architect and Hygiene: Supervision/safety               Vision Patient Visual Report: No change from baseline Vision Assessment?: No apparent visual deficits     Perception         Praxis         Pertinent Vitals/Pain Pain Assessment Pain Assessment: 0-10 Pain Score: 7  Pain Location: jaw Pain Descriptors / Indicators: Discomfort, Sharp Pain Intervention(s): Monitored during session     Extremity/Trunk Assessment Upper Extremity Assessment Upper Extremity Assessment: Generalized weakness   Lower Extremity Assessment Lower Extremity Assessment: Defer to PT evaluation   Cervical / Trunk Assessment Cervical / Trunk Assessment: Normal   Communication Communication Communication: No apparent difficulties   Cognition Arousal: Alert Behavior During Therapy: WFL for tasks assessed/performed Cognition: No apparent impairments  Following commands: Intact       Cueing  General Comments   Cueing Techniques: Verbal cues  VSS on RA   Exercises     Shoulder Instructions      Home Living Family/patient expects to be discharged to:: Shelter/Homeless                                  Additional Comments: Has been staying at motel      Prior Functioning/Environment Prior Level of Function : Independent/Modified Independent;Needs assist             Mobility Comments: Occassional assistance from friend that stays with her to step into the shower ADLs Comments: Independent, occassional light assistance for transfers to showe. uses trashcan by bed as BSC.    OT Problem List: Decreased strength;Decreased activity tolerance;Impaired balance (sitting and/or standing);Decreased safety awareness;Pain   OT Treatment/Interventions: Self-care/ADL training;Therapeutic exercise;Energy conservation;DME and/or AE instruction;Patient/family education;Balance training      OT Goals(Current goals can be found in the care plan section)   Acute Rehab OT Goals Patient Stated Goal: to sleep OT Goal Formulation: With patient Time For Goal Achievement: 07/18/24 Potential to Achieve Goals: Good   OT Frequency:  Min 1X/week    Co-evaluation              AM-PAC OT 6 Clicks Daily Activity     Outcome Measure Help from another person eating meals?: None Help from another person taking care of personal grooming?: A Little Help from another person toileting, which includes using toliet, bedpan, or urinal?: A Little Help from another person bathing (including washing, rinsing, drying)?: A Little Help from another person to put on and taking off regular upper body clothing?: None Help from another person to put on and taking off regular lower body clothing?: A Little 6 Click Score: 20   End of Session Equipment Utilized During Treatment: Gait belt  Activity Tolerance: Patient tolerated treatment well Patient left: in bed;with call bell/phone within reach;with family/visitor present  OT Visit Diagnosis: Unsteadiness on feet (R26.81);Muscle weakness (generalized) (M62.81);Pain                Time: 9169-9157 OT Time Calculation (min): 12  min Charges:  OT General Charges $OT Visit: 1 Visit OT Evaluation $OT Eval Low Complexity: 1 Low  Maurilio CROME, OTR/L.  [facility] Acute Rehabilitation  Office: [phone]   Maurilio PARAS Ahmani Prehn 07/04/2024, 8:56 AM

## 2024-07-05 ENCOUNTER — Telehealth: Payer: Self-pay

## 2024-07-05 DIAGNOSIS — J36 Peritonsillar abscess: Secondary | ICD-10-CM | POA: Diagnosis not present

## 2024-07-05 LAB — BASIC METABOLIC PANEL WITH GFR
Anion gap: 9 (ref 5–15)
BUN: 15 mg/dL (ref 6–20)
CO2: 29 mmol/L (ref 22–32)
Calcium: 10.4 mg/dL — ABNORMAL HIGH (ref 8.9–10.3)
Chloride: 104 mmol/L (ref 98–111)
Creatinine, Ser: 0.82 mg/dL (ref 0.44–1.00)
GFR, Estimated: >60 mL/min
Glucose, Bld: 166 mg/dL — ABNORMAL HIGH (ref 70–99)
Potassium: 3.4 mmol/L — ABNORMAL LOW (ref 3.5–5.1)
Sodium: 141 mmol/L (ref 135–145)

## 2024-07-05 LAB — GLUCOSE, CAPILLARY
Glucose-Capillary: 187 mg/dL — ABNORMAL HIGH (ref 70–99)
Glucose-Capillary: 227 mg/dL — ABNORMAL HIGH (ref 70–99)
Glucose-Capillary: 253 mg/dL — ABNORMAL HIGH (ref 70–99)
Glucose-Capillary: 333 mg/dL — ABNORMAL HIGH (ref 70–99)

## 2024-07-05 LAB — MAGNESIUM: Magnesium: 1.5 mg/dL — ABNORMAL LOW (ref 1.7–2.4)

## 2024-07-05 LAB — CBC
HCT: 45.1 % (ref 36.0–46.0)
Hemoglobin: 14.2 g/dL (ref 12.0–15.0)
MCH: 26.9 pg (ref 26.0–34.0)
MCHC: 31.5 g/dL (ref 30.0–36.0)
MCV: 85.6 fL (ref 80.0–100.0)
Platelets: 221 K/uL (ref 150–400)
RBC: 5.27 MIL/uL — ABNORMAL HIGH (ref 3.87–5.11)
RDW: 14.2 % (ref 11.5–15.5)
WBC: 4.6 K/uL (ref 4.0–10.5)
nRBC: 0 % (ref 0.0–0.2)

## 2024-07-05 MED ORDER — INSULIN GLARGINE 100 UNIT/ML ~~LOC~~ SOLN
10.0000 [IU] | Freq: Every day | SUBCUTANEOUS | Status: DC
  Administered 2024-07-05 – 2024-07-06 (×2): 10 [IU] via SUBCUTANEOUS
  Filled 2024-07-05 (×2): qty 0.1

## 2024-07-05 MED ORDER — GLIPIZIDE 5 MG PO TABS
10.0000 mg | ORAL_TABLET | Freq: Every day | ORAL | Status: DC
  Administered 2024-07-06: 10 mg via ORAL
  Filled 2024-07-05: qty 2

## 2024-07-05 MED ORDER — MAGNESIUM SULFATE 4 GM/100ML IV SOLN
4.0000 g | Freq: Once | INTRAVENOUS | Status: AC
  Administered 2024-07-05: 4 g via INTRAVENOUS
  Filled 2024-07-05: qty 100

## 2024-07-05 MED ORDER — GLIPIZIDE 5 MG PO TABS
5.0000 mg | ORAL_TABLET | ORAL | Status: AC
  Administered 2024-07-05: 5 mg via ORAL
  Filled 2024-07-05: qty 1

## 2024-07-05 MED ORDER — AMOXICILLIN-POT CLAVULANATE 875-125 MG PO TABS
1.0000 | ORAL_TABLET | Freq: Three times a day (TID) | ORAL | Status: DC
  Administered 2024-07-05 – 2024-07-06 (×2): 1 via ORAL
  Filled 2024-07-05 (×2): qty 1

## 2024-07-05 MED ORDER — POTASSIUM CHLORIDE CRYS ER 20 MEQ PO TBCR
40.0000 meq | EXTENDED_RELEASE_TABLET | Freq: Two times a day (BID) | ORAL | Status: DC
  Administered 2024-07-05 – 2024-07-06 (×3): 40 meq via ORAL
  Filled 2024-07-05 (×3): qty 2

## 2024-07-05 NOTE — Assessment & Plan Note (Signed)
 MDD - continue sertraline  50 mg daily, ResperiDONE 0.5 mg QHS Asthma - continue home albuterol , anoro ellipta  PRN

## 2024-07-05 NOTE — Progress Notes (Signed)
 CSW met with pt regarding SDOH: Food, Housing, transportation.  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   Food: pt does get food stamps, $126 per month and also utilizes [employer] for food.  Housing: pt has been able to obtain enough money to keep a room at [employer] in [place].  Pt was receiving disability money up until October when she missed an appt and it was cut off.  Her prior monthly SSI was $964 which would pay for 3 weeks in the hotel plus some food money and she could raise funds for the 4th week most of the time.  Pt is familiar with [employer].  Pt has been at [employer] and [facility] house in the past, does not do well in group settings due to past trauma and prefers her current set up.  CSW provided info on [employer] but pt reports she has been there in the past and they are not helpful.  Transportation: pt is able to use her money to ride the bus when needed.  She does request transportation assistance home from this hospitalization.  Overall, pt spoke quite knowledegably about local resources.  She is working with her careers information officer at [employer] to appeal her SSI and hopes it will be restored.  Cathlyn Ferry, MSW, LCSW 1/15/20264:13 PM

## 2024-07-05 NOTE — Telephone Encounter (Signed)
 Copied from CRM [id number]. Topic: General - Other >> Jul 05, 2024  4:11 PM Zebedee SAUNDERS wrote: Reason for CRM: Pt need to be scheduled for hospital follow up/ New PT. Pt ride bus needs clinic close to pt, please call Jon Hoit NP [phone] to schedule pt.  Please advise.

## 2024-07-05 NOTE — Inpatient Diabetes Management (Signed)
 Inpatient Diabetes Program Recommendations  AACE/ADA: New Consensus Statement on Inpatient Glycemic Control (2015)  Target Ranges:  Prepandial:   less than 140 mg/dL      Peak postprandial:   less than 180 mg/dL (1-2 hours)      Critically ill patients:  140 - 180 mg/dL   Lab Results  Component Value Date   GLUCAP 227 (H) 07/05/2024   HGBA1C 11.9 (H) 07/04/2024    Review of Glycemic Control  Latest Reference Range & Units 07/04/24 16:06 07/04/24 19:34 07/04/24 23:16 07/05/24 03:10 07/05/24 08:09  Glucose-Capillary 70 - 99 mg/dL 803 (H) 878 (H) 805 (H) 187 (H) 227 (H)  (H): Data is abnormally high Diabetes history: DM2   Outpatient Diabetes medications:  Metformin  500mg  BID (Not Taking)   Current orders for Inpatient glycemic control:  Glipizide  5mg  BID  Novolog  0-20 Q4HRS    Inpatient Diabetes Program Recommendations:   Consider adding Lantus  8 units every day.   Thanks, Tinnie Minus, MSN, R[facility] Diabetes Coordinator [phone] (8a-5p)

## 2024-07-05 NOTE — Assessment & Plan Note (Addendum)
 Completed 14 days of Augmentin . CT neck on admission shows improving tonsillitis w/ remain 1 cm of Right peritonsillar abscess. Normal WBC. No fever since admission   - Transition UNASYN  3 g IV Q6H (1/13 - 07/04/24 ) to Augmentin  TID for the total of 14 days (07/05/24 - 07/16/24 ) - Will reach out to ENT if her symptoms become worsening and follow up 2 weeks after discharge.  - AM CBC - Pain : Tylenol  650 mg Q6H PRN Oxycodone  5 mg Q6H PRN  - Bowel Regimen : MiraLax  17g Daily , Senna 2 tab at bedtime - PT/OT consulted and follow  - Sleep : Melatonin 3 mg QHS

## 2024-07-05 NOTE — TOC Progression Note (Signed)
 Transition of Care [facility]) - Progression Note    Patient Details  Name: Julie [name] MRN: [id number] Date of Birth: Sep 13, 1975  Transition of Care [facility]) CM/SW Contact  Rosalva Jon Bloch, RN Phone Number: 07/05/2024, 4:26 PM  Clinical Narrative:    NCM received consult:  A1C 11.9%. Needs PCP/follow-up appt  Calls made seeking PCP appointment unable to secure , earliest appointment provided was in March, MD made aware . Family Medicine Team to follow post d/c.  ICM team will continue to follow and assist with needs...  Expected Discharge Plan: Home/Self Care Barriers to Discharge: Continued Medical Work up               Expected Discharge Plan and Services                                               Social Drivers of Health (SDOH) Interventions SDOH Screenings   Food Insecurity: Food Insecurity Present (06/18/2024)  Housing: High Risk (06/18/2024)  Transportation Needs: Unmet Transportation Needs (06/18/2024)  Utilities: Not At Risk (06/18/2024)  Alcohol  Screen: Low Risk (03/02/2024)  Tobacco Use: High Risk (06/17/2024)    Readmission Risk Interventions    02/10/2024   11:16 AM 09/27/2023   12:14 PM  Readmission Risk Prevention Plan  Transportation Screening Complete Complete  PCP or Specialist Appt within 3-5 Days  Complete  HRI or Home Care Consult  Complete  Social Work Consult for Recovery Care Planning/Counseling  Complete  Palliative Care Screening  Not Applicable  Medication Review Oceanographer) Complete Complete  PCP or Specialist appointment within 3-5 days of discharge Complete   HRI or Home Care Consult Complete   Palliative Care Screening Not Applicable   Skilled Nursing Facility Not Applicable

## 2024-07-05 NOTE — Plan of Care (Signed)

## 2024-07-05 NOTE — Telephone Encounter (Signed)
 Copied from CRM [id number]. Topic: General - Other >> Jul 05, 2024  4:11 PM Zebedee SAUNDERS wrote: Reason for CRM: Pt need to be scheduled for hospital follow up/ New PT. Pt ride bus needs clinic close to pt, please call Jon Hoit NP [phone] to schedule pt.

## 2024-07-05 NOTE — Assessment & Plan Note (Signed)
 QTCb 540 on admission 497 last admission  - Continue telemetry  - Continue to monitor - Avoid medication w/ inc QT - AM BMP w/ Mg

## 2024-07-05 NOTE — Assessment & Plan Note (Addendum)
 A1C 11.9 on admission. BG 286 this morning w/ BMP - Starting Q4H CBG before meal - Starting SSI resistant  - Starting glipiZIDE  5 mg BID before meal starting 10 mg tmrw - Hold home Metformin   - Starting Lantus  10 mg Daily  - Will hold off on SGLT2 inhibitor like Farxiga  or Jardiance considering risk for UTIs d/t A1C greater than 10

## 2024-07-05 NOTE — Assessment & Plan Note (Addendum)
 Received Lasix  40 mg PO in ED once then IV Lasix  40 mg x3 last one was 07/04/24  in addition to home dose. Weight 121.9 this morning from 122.4 kg on admission I/O : uncharted Echo on 07/04/24 shows worsening cardiac fx w. LVEF of 30- 35% compare to 50-55% last March w/ global hypokinesis and LV hypertrophy likely hypertensive cardiomyopathy.  - Cardiology consult, appreciate recommendations  - Stop Losartan  and start Irbesartan  300 mg daily while inpatient  - Recommended Valsartan  320 mg daily at discharge   - Start Metoprolol  succinate 50 mg daily d/t hx of Asthma   - Start Spironolactone  25 mg daily   - Continue amlodipine  10 mg daily - HF consult, appreciate recomendations - Continue Lasix  20 mg PO daily - Continue Norvasc  10 mg daily  - Continue Atorvastatin  40 mg Daily for HLD - Continue albuterol  neb Q6H PRN  - Daily weight - Strict I&O  - Continue Pulse sat

## 2024-07-05 NOTE — Discharge Instructions (Addendum)
 Dear Julie [name] Alert,  Thank you for letting us  participate in your care. You were hospitalized for tachycardia, worsening SOB, and severe hypertension and diagnosed with CHF exacerbation You were treated with Antibiotic.   POST-HOSPITAL & CARE INSTRUCTIONS Go to your follow up appointments (listed below) Follow up with Dr. Anice at address below for jaw pain, peritonsillar abscess [facility] [address] [place], [place] [address] [phone]  DOCTOR'S APPOINTMENT   No future appointments.   Take care and be well!  Family Medicine Teaching Service Inpatient Team [facility]  [facility]  [address] [place], [place] [address] [phone]

## 2024-07-05 NOTE — Progress Notes (Signed)
"  °  Progress Note  Patient Name: Julie [name] Date of Encounter: 07/05/2024 [facility] Cardiologist: None   Interval Summary   Feeling much better.  Able to lie completely supine in bed without shortness of breath.  Blood pressure is also greatly improved although the diastolic blood pressure remains a little high.. Hypokalemia/hypomagnesemia being replaced.  Vital Signs Vitals:   07/04/24 2000 07/05/24 0346 07/05/24 0500 07/05/24 0810  BP: (!) 150/93 (!) 122/105  (!) 133/92  Pulse: 96 75  70  Resp: 18 18  16   Temp: 98.4 F (36.9 C)     TempSrc: Oral     SpO2: 98% 96%  100%  Weight:   121.9 kg    No intake or output data in the 24 hours ending 07/05/24 1344    07/05/2024    5:00 AM 07/04/2024    5:00 AM 06/18/2024   12:49 PM  Last 3 Weights  Weight (lbs) 268 lb 11.9 oz 269 lb 13.5 oz 278 lb  Weight (kg) 121.9 kg 122.4 kg 126.1 kg      Telemetry/ECG  N/a - Personally Reviewed  Physical Exam  GEN: No acute distress.  Morbid obesity Neck: Unable to evaluate JVD Cardiac: RRR, no murmurs, rubs, or gallops.  Respiratory: Clear to auscultation bilaterally. GI: Soft, nontender, non-distended  MS: No edema  Assessment & Plan  Has made good progress on the current medications, the trick will be ensuring that she can receive them as an outpatient.  Spironolactone  will not have kicked in fully for another couple of weeks, so I think we do not need to adjust the medication doses any further at this point. She is awaiting a visit from the case manager/social worker to see if we can ensure any type of indigent benefits.  [facility] Please consult [email or web address] for contact info under         Signed, Jerel Balding, MD   "

## 2024-07-05 NOTE — Progress Notes (Addendum)
 "    Daily Progress Note Intern Pager: [phone]  Patient name: Julie [name] Medical record number: [id number] Date of birth: July 21, 1975 Age: 49 y.o. Gender: female  Primary Care Provider: Pcp, No Consultants: None Code Status: Full Code  Pt Overview and Major Events to Date:  07/03/24 Admitted   Ms. Julie [name] is a 49 YO Female w/ PMH of Asthma, DMII, MDD, Cocaine abuse, HTN, Cigarette smoking, GAD, and PTSD who recently admitted for peritonsillar abscess which was drained in ED by ENT. She present w/ worsening voice change, SOB, tachycardia,and severe HTN. Repeat Echo on 1/14 shows worsening CO.  Assessment & Plan Peritonsillar abscess Completed 14 days of Augmentin . CT neck on admission shows improving tonsillitis w/ remain 1 cm of Right peritonsillar abscess. Normal WBC. No fever since admission   - Transition UNASYN  3 g IV Q6H (1/13 - 07/04/24 ) to Augmentin  TID for the total of 14 days (07/05/24 - 07/16/24 ) - Will reach out to ENT if her symptoms become worsening and follow up 2 weeks after discharge.  - AM CBC - Pain : Tylenol  650 mg Q6H PRN Oxycodone  5 mg Q6H PRN  - Bowel Regimen : MiraLax  17g Daily , Senna 2 tab at bedtime - PT/OT consulted and follow  - Sleep : Melatonin 3 mg QHS Acute exacerbation of CHF (congestive heart failure) (HCC) Dyspnea Primary hypertension Acute on chronic combined systolic and diastolic CHF (congestive heart failure) (HCC) Received Lasix  40 mg PO in ED once then IV Lasix  40 mg x3 last one was 07/04/24  in addition to home dose. Weight 121.9 this morning from 122.4 kg on admission I/O : uncharted Echo on 07/04/24 shows worsening cardiac fx w. LVEF of 30- 35% compare to 50-55% last March w/ global hypokinesis and LV hypertrophy likely hypertensive cardiomyopathy.  - Cardiology consult, appreciate recommendations  - Stop Losartan  and start Irbesartan  300 mg daily while inpatient  - Recommended Valsartan  320 mg daily at discharge   - Start Metoprolol   succinate 50 mg daily d/t hx of Asthma   - Start Spironolactone  25 mg daily   - Continue amlodipine  10 mg daily - HF consult, appreciate recomendations - Continue Lasix  20 mg PO daily - Continue Norvasc  10 mg daily  - Continue Atorvastatin  40 mg Daily for HLD - Continue albuterol  neb Q6H PRN  - Daily weight - Strict I&O  - Continue Pulse sat T2DM (type 2 diabetes mellitus) (HCC) A1C 11.9 on admission. BG 286 this morning w/ BMP - Starting Q4H CBG before meal - Starting SSI resistant  - Starting glipiZIDE  5 mg BID before meal starting 10 mg tmrw - Hold home Metformin   - Starting Lantus  10 mg Daily  - Will hold off on SGLT2 inhibitor like Farxiga  or Jardiance considering risk for UTIs d/t A1C greater than 10 Prolonged Q-T interval on ECG QTCb 540 on admission 497 last admission  - Continue telemetry  - Continue to monitor - Avoid medication w/ inc QT - AM BMP w/ Mg  Chronic health problem MDD - continue sertraline  50 mg daily, ResperiDONE 0.5 mg QHS Asthma - continue home albuterol , anoro ellipta  PRN  FEN/GI: Carb modified diet  PPx: Lovenox  Dispo:Pending clinical improvement   Subjective:  Pt states she feel better this morning. TOC was consulted yesterday d/t her living situation w/ unsecure home. Pt states [place] motel [place] night. However she has a medicaid to help with her medications expense.   Objective: Temp:  [  98 F (36.7 C)-98.4 F (36.9 C)] 98.4 F (36.9 C) (01/14 2000) Pulse Rate:  [70-97] 70 (01/15 0810) Resp:  [16-18] 16 (01/15 0810) BP: (122-151)/(91-105) 133/92 (01/15 0810) SpO2:  [96 %-100 %] 100 % (01/15 0810) Weight:  [121.9 kg] 121.9 kg (01/15 0500)  Physical Exam Constitutional:      Appearance: She is obese.  Cardiovascular:     Rate and Rhythm: Normal rate.     Heart sounds: Normal heart sounds.  Pulmonary:     Breath sounds: Rales present.  Abdominal:     Palpations: Abdomen is soft.  Neurological:      Mental Status: She is alert.      Laboratory: Most recent CBC Lab Results  Component Value Date   WBC 4.6 07/05/2024   HGB 14.2 07/05/2024   HCT 45.1 07/05/2024   MCV 85.6 07/05/2024   PLT 221 07/05/2024   Most recent BMP    Latest Ref Rng & Units 07/05/2024    5:18 AM  BMP  Glucose 70 - 99 mg/dL 833   BUN 6 - 20 mg/dL 15   Creatinine 9.55 - 1.00 mg/dL 9.17   Sodium 864 - 854 mmol/L 141   Potassium 3.5 - 5.1 mmol/L 3.4   Chloride 98 - 111 mmol/L 104   CO2 22 - 32 mmol/L 29   Calcium  8.9 - 10.3 mg/dL 89.5     [name], DO 07/05/2024, 9:16 AM  PGY-1, [facility] FPTS Intern pager: [phone], text pages welcome Secure chat group [facility] Teaching Service   "

## 2024-07-06 ENCOUNTER — Telehealth (HOSPITAL_COMMUNITY): Payer: Self-pay | Admitting: Pharmacy Technician

## 2024-07-06 ENCOUNTER — Encounter (HOSPITAL_COMMUNITY): Payer: Self-pay | Admitting: Family Medicine

## 2024-07-06 ENCOUNTER — Other Ambulatory Visit (HOSPITAL_COMMUNITY): Payer: Self-pay

## 2024-07-06 DIAGNOSIS — J36 Peritonsillar abscess: Secondary | ICD-10-CM | POA: Diagnosis not present

## 2024-07-06 LAB — BASIC METABOLIC PANEL WITH GFR
Anion gap: 9 (ref 5–15)
BUN: 15 mg/dL (ref 6–20)
CO2: 24 mmol/L (ref 22–32)
Calcium: 10 mg/dL (ref 8.9–10.3)
Chloride: 108 mmol/L (ref 98–111)
Creatinine, Ser: 0.73 mg/dL (ref 0.44–1.00)
GFR, Estimated: >60 mL/min
Glucose, Bld: 147 mg/dL — ABNORMAL HIGH (ref 70–99)
Potassium: 3.9 mmol/L (ref 3.5–5.1)
Sodium: 140 mmol/L (ref 135–145)

## 2024-07-06 LAB — GLUCOSE, CAPILLARY
Glucose-Capillary: 112 mg/dL — ABNORMAL HIGH (ref 70–99)
Glucose-Capillary: 130 mg/dL — ABNORMAL HIGH (ref 70–99)
Glucose-Capillary: 145 mg/dL — ABNORMAL HIGH (ref 70–99)
Glucose-Capillary: 172 mg/dL — ABNORMAL HIGH (ref 70–99)

## 2024-07-06 LAB — MAGNESIUM: Magnesium: 1.7 mg/dL (ref 1.7–2.4)

## 2024-07-06 MED ORDER — CARVEDILOL 6.25 MG PO TABS: 6.2500 mg | ORAL_TABLET | Freq: Two times a day (BID) | ORAL | Status: DC

## 2024-07-06 MED ORDER — SACUBITRIL-VALSARTAN 49-51 MG PO TABS
1.0000 | ORAL_TABLET | Freq: Two times a day (BID) | ORAL | 1 refills | Status: AC
  Filled 2024-07-06: qty 60, 30d supply, fill #0

## 2024-07-06 MED ORDER — FUROSEMIDE 20 MG PO TABS
20.0000 mg | ORAL_TABLET | Freq: Every day | ORAL | 0 refills | Status: AC
  Filled 2024-07-06: qty 30, 30d supply, fill #0

## 2024-07-06 MED ORDER — METOPROLOL SUCCINATE ER 50 MG PO TB24
50.0000 mg | ORAL_TABLET | Freq: Every day | ORAL | 0 refills | Status: DC
  Filled 2024-07-06: qty 30, 30d supply, fill #0

## 2024-07-06 MED ORDER — MAGNESIUM SULFATE 4 GM/100ML IV SOLN
4.0000 g | Freq: Once | INTRAVENOUS | Status: DC
  Filled 2024-07-06: qty 100

## 2024-07-06 MED ORDER — VALSARTAN 320 MG PO TABS
320.0000 mg | ORAL_TABLET | Freq: Every day | ORAL | 11 refills | Status: DC
  Filled 2024-07-06: qty 30, 30d supply, fill #0

## 2024-07-06 MED ORDER — SPIRONOLACTONE 25 MG PO TABS
25.0000 mg | ORAL_TABLET | Freq: Every day | ORAL | 0 refills | Status: AC
  Filled 2024-07-06: qty 30, 30d supply, fill #0

## 2024-07-06 MED ORDER — AMLODIPINE BESYLATE 10 MG PO TABS
10.0000 mg | ORAL_TABLET | Freq: Every day | ORAL | 0 refills | Status: AC
  Filled 2024-07-06: qty 30, 30d supply, fill #0

## 2024-07-06 MED ORDER — GLIPIZIDE 10 MG PO TABS
10.0000 mg | ORAL_TABLET | Freq: Every day | ORAL | 0 refills | Status: AC
  Filled 2024-07-06: qty 30, 30d supply, fill #0

## 2024-07-06 MED ORDER — RISPERIDONE 0.5 MG PO TABS
0.5000 mg | ORAL_TABLET | Freq: Every day | ORAL | 0 refills | Status: AC
  Filled 2024-07-06: qty 30, 30d supply, fill #0

## 2024-07-06 MED ORDER — SERTRALINE HCL 50 MG PO TABS
50.0000 mg | ORAL_TABLET | Freq: Every day | ORAL | 0 refills | Status: AC
  Filled 2024-07-06: qty 30, 30d supply, fill #0

## 2024-07-06 MED ORDER — ATORVASTATIN CALCIUM 40 MG PO TABS
40.0000 mg | ORAL_TABLET | Freq: Every day | ORAL | 0 refills | Status: AC
  Filled 2024-07-06: qty 30, 30d supply, fill #0

## 2024-07-06 MED ORDER — CARVEDILOL 6.25 MG PO TABS
6.2500 mg | ORAL_TABLET | Freq: Two times a day (BID) | ORAL | 1 refills | Status: AC
  Filled 2024-07-06: qty 60, 30d supply, fill #0

## 2024-07-06 MED ORDER — UMECLIDINIUM-VILANTEROL 62.5-25 MCG/ACT IN AEPB
1.0000 | INHALATION_SPRAY | Freq: Every day | RESPIRATORY_TRACT | 0 refills | Status: AC
  Filled 2024-07-06: qty 60, 30d supply, fill #0

## 2024-07-06 MED ORDER — AMOXICILLIN-POT CLAVULANATE 875-125 MG PO TABS
1.0000 | ORAL_TABLET | Freq: Three times a day (TID) | ORAL | 0 refills | Status: AC
  Filled 2024-07-06: qty 30, 10d supply, fill #0

## 2024-07-06 MED ORDER — METFORMIN HCL 500 MG PO TABS
500.0000 mg | ORAL_TABLET | Freq: Two times a day (BID) | ORAL | 0 refills | Status: AC
  Filled 2024-07-06: qty 60, 30d supply, fill #0

## 2024-07-06 MED ORDER — ALBUTEROL SULFATE HFA 108 (90 BASE) MCG/ACT IN AERS
1.0000 | INHALATION_SPRAY | Freq: Four times a day (QID) | RESPIRATORY_TRACT | 0 refills | Status: AC | PRN
  Filled 2024-07-06: qty 18, 25d supply, fill #0

## 2024-07-06 NOTE — Assessment & Plan Note (Signed)
 Received Lasix  40 mg PO in ED once then IV Lasix  40 mg x3 last one was 07/04/24  in addition to home dose. Weight 121.9 yesterday from 122.4 kg on admission I/O : uncharted Echo on 07/04/24 shows worsening cardiac fx w. LVEF of 30- 35% compare to 50-55% last March w/ global hypokinesis and LV hypertrophy likely hypertensive cardiomyopathy.  - Cardiology consult, appreciate recommendations  - Stop Losartan  per cardiology - Continue Irbesartan  300 mg daily while inpatient  - Recommended Valsartan  320 mg daily at discharge   - Continue Metoprolol  succinate 50 mg daily d/t hx of Asthma   - Continue Spironolactone  25 mg daily   - Continue amlodipine  10 mg daily - HF consult, appreciate recomendations - Continue Lasix  20 mg PO daily - Continue Norvasc  10 mg daily  - Continue Atorvastatin  40 mg Daily for HLD - Continue albuterol  neb Q6H PRN  - Daily weight - Strict I&O  - Continue Pulse sat - PT/OT consult for eval

## 2024-07-06 NOTE — Discharge Summary (Addendum)
 "  Family Medicine Teaching [facility] Discharge Summary  Patient name: Julie [name] Medical record number: [id number] Date of birth: 1976-02-23 Age: 49 y.o. Gender: female Date of Admission: 07/03/2024  Date of Discharge: 07/06/24 Admitting Physician: Otto ONEIDA Fairly, MD  Primary Care Provider: Pcp, No Consultants: Cardiology  Indication for Hospitalization: CHF exacerbation  Discharge Diagnoses/Problem List:  Principal Problem for Admission:  Other Problems addressed during stay:  Principal Problem:   Peritonsillar abscess Active Problems:   Acute on chronic combined systolic and diastolic CHF (congestive heart failure) (HCC)   Primary hypertension   Chronic health problem   T2DM (type 2 diabetes mellitus) (HCC)   Dyspnea   Acute exacerbation of CHF (congestive heart failure) (HCC)   Prolonged Q-T interval on ECG    Brief Hospital Course:  OLLA [name] is a 49 y.o.female with a history of MDD, T2DM, asthma, HTN who was admitted to the [facility] Medicine Teaching Service at [facility] for pain, shortness of breath. Her hospital course is detailed below:  Right jaw swelling and pain  Patient was previously admitted 12/28 to 12/29 for peritonsillar abscess, completed full course of Augmentin .  Pain recently began again in her jaw.  She was started on Unasyn  (1/13- 07/05/24). Received 1 dose of Vancomycin  IV in ED. CT soft tissue neck on admission showed improved appearance of tonsillitis and 1 cm right peritonsillar abscess.  She was transition back to Augmentin  TID for the total of 14 days antibiotic treatment.   CHF exacerbation In ED her proBNP was elevated w/ unremarkable CXR however subsequent chest x-ray concerning for mild vascular congestion and edema. Lasix  IV was given. Echo on 07/04/24 shows worsening cardiac fx w. LVEF of 30- 35% compare to 50-55% last March w/ global hypokinesis and LV hypertrophy likely hypertensive cardiomyopathy. Cardiology was consult which  recommended stopping Losartan  and starting Irbesartan  300 mg daily while inpatient then switched to Entresto  49-51 mg twice daily at discharge, Starting Metoprolol  succinate 50 mg which switch to carvedilol  twice a day at discharge, Spironolactone  25 mg, and Continue amlodipine  10 mg daily   DMII A1C on admission was 11.9. We started her on  glipiZIDE  5 then increase to 10 mg BID before meal with LAI while hold her home Metformin . We start her back on her Metformin  w/o insulin  for discharge   Other chronic conditions were medically managed with home medications and formulary alternatives as necessary   PCP Follow-up Recommendations: Follow up with ENT Cardiology f/u If able, consider monthly clinic visit to obtain insulin  pen which will be safe at room temp for 28 days. Consider magnesium  supplementation (required significant replacement in hospital).    Results/Tests Pending at Time of Discharge:  Unresulted Labs (From admission, onward)     Start     Ordered   07/10/24 0500  Creatinine, serum  (enoxaparin  (LOVENOX )    CrCl >/= 30 ml/min)  Weekly,   R     Comments: while on enoxaparin  therapy    07/03/24 1635             Disposition: Home  Discharge Condition: Stable  Discharge Exam:  Vitals:   07/06/24 0432 07/06/24 0811  BP: 112/76 134/74  Pulse: 71 79  Resp: 18 16  Temp: 97.6 F (36.4 C) 98.6 F (37 C)  SpO2: 96% 99%   Physical Exam Constitutional:      Appearance: She is obese.  Cardiovascular:     Rate and Rhythm: Normal rate.  Pulmonary:  Effort: Pulmonary effort is normal.  Neurological:     Mental Status: She is alert and oriented to person, place, and time.   Significant Procedures: Echo  Significant Labs and Imaging:  Recent Labs  Lab 07/05/24 0518  WBC 4.6  HGB 14.2  HCT 45.1  PLT 221   Recent Labs  Lab 07/05/24 0518 07/06/24 0450  NA 141 140  K 3.4* 3.9  CL 104 108  CO2 29 24  GLUCOSE 166* 147*  BUN 15 15  CREATININE 0.82  0.73  CALCIUM  10.4* 10.0  MG 1.5* 1.7    Discharge Medications:  Allergies as of 07/06/2024   Not on File      Medication List     STOP taking these medications    amoxicillin  500 MG tablet Commonly known as: AMOXIL    losartan  50 MG tablet Commonly known as: Cozaar        TAKE these medications    Acetaminophen  Extra Strength 500 MG Tabs Take 2 tablets (1,000 mg total) by mouth every 6 (six) hours as needed for mild pain (pain score 1-3).   albuterol  108 (90 Base) MCG/ACT inhaler Commonly known as: VENTOLIN  HFA Inhale 1-2 puffs into the lungs every 6 (six) hours as needed for wheezing or shortness of breath.   amLODipine  10 MG tablet Commonly known as: NORVASC  Take 1 tablet (10 mg total) by mouth daily.   amoxicillin -clavulanate 875-125 MG tablet Commonly known as: AUGMENTIN  Take 1 tablet by mouth 3 (three) times daily for 10 days.   atorvastatin  40 MG tablet Commonly known as: LIPITOR Take 1 tablet (40 mg total) by mouth daily. Start taking on: July 07, 2024   carvedilol  6.25 MG tablet Commonly known as: COREG  Take 1 tablet (6.25 mg total) by mouth 2 (two) times daily with a meal. Start taking on: July 07, 2024   furosemide  20 MG tablet Commonly known as: LASIX  Take 1 tablet (20 mg total) by mouth daily.   glipiZIDE  10 MG tablet Commonly known as: GLUCOTROL  Take 1 tablet (10 mg total) by mouth daily before breakfast. Start taking on: July 07, 2024   metFORMIN  500 MG tablet Commonly known as: GLUCOPHAGE  Take 1 tablet (500 mg total) by mouth 2 (two) times daily with a meal.   naproxen  sodium 220 MG tablet Commonly known as: ALEVE  Take 220 mg by mouth daily as needed (pain).   risperiDONE  0.5 MG tablet Commonly known as: RisperDAL  Take 1 tablet (0.5 mg total) by mouth at bedtime.   sacubitril -valsartan  49-51 MG Commonly known as: Entresto  Take 1 tablet by mouth 2 (two) times daily.   sertraline  50 MG tablet Commonly known as:  ZOLOFT  Take 1 tablet (50 mg total) by mouth daily. What changed:  when to take this reasons to take this   spironolactone  25 MG tablet Commonly known as: ALDACTONE  Take 1 tablet (25 mg total) by mouth daily. Start taking on: July 07, 2024   umeclidinium-vilanterol 62.5-25 MCG/ACT Aepb Commonly known as: Anoro Ellipta  Inhale 1 puff into the lungs daily.        Discharge Instructions: Please refer to Patient Instructions section of EMR for full details.  Patient was counseled important signs and symptoms that should prompt return to medical care, changes in medications, dietary instructions, activity restrictions, and follow up appointments.   Follow-Up Appointments:  Follow-up Information     [facility]. Go on 07/13/2024.   Specialty: Cardiology Why: Hospital Follow-Up 07/13/24 @ 11:30 AM Please bring  all medications to follow-up appointment [facility], Entrance C off of [address] [place] Parking at the door or use [employer] Code [phone] to park under the building. Contact information: [address] [place]  [address] [phone]        [name], DO Follow up in 1 week(s).   Specialty: Family Medicine Contact information: [address] [place] [address] [phone]         Tharon Lung, MD Follow up on 07/10/2024.   Specialty: Family Medicine Contact information: [address] [place] [address] [phone]                 Suzen Elder NOVAK, DO 07/06/2024, 1:24 PM PGY-1, [place] [facility]    FPTS Upper-Level Resident Addendum   I have discussed the above with Dr. Suzen and agree with the documented plan. My edits for correction/addition/clarification are included above. Please see any attending notes.   Payton Coward, MD PGY-3, [facility] 07/06/2024 1:24 PM  FPTS Service pager: [phone] (text pages welcome through  AMION) "

## 2024-07-06 NOTE — Progress Notes (Signed)
"  °  Progress Note  Patient Name: Julie [name] Date of Encounter: 07/06/2024 [facility] Cardiologist: None   Interval Summary   No dyspnea. BP much better  Vital Signs Vitals:   07/05/24 2126 07/05/24 2247 07/06/24 0432 07/06/24 0811  BP: (!) 164/99 133/74 112/76 134/74  Pulse:   71 79  Resp:   18 16  Temp:   97.6 F (36.4 C) 98.6 F (37 C)  TempSrc:    Oral  SpO2:   96% 99%  Weight:        Intake/Output Summary (Last 24 hours) at 07/06/2024 1343 Last data filed at 07/06/2024 9166 Gross per 24 hour  Intake 240 ml  Output --  Net 240 ml      07/05/2024    5:00 AM 07/04/2024    5:00 AM 06/18/2024   12:49 PM  Last 3 Weights  Weight (lbs) 268 lb 11.9 oz 269 lb 13.5 oz 278 lb  Weight (kg) 121.9 kg 122.4 kg 126.1 kg      Telemetry/ECG   - Personally Reviewed  Physical Exam  GEN: No acute distress.  Severely obese Neck: No JVD Cardiac: RRR, no murmurs, rubs, or gallops.  Respiratory: Clear to auscultation bilaterally. GI: Soft, nontender, non-distended  MS: No edema  Assessment & Plan   Discussed fine tuning her meds w primary team and PharmD Brand name Entresto  $4 copay and carvedilol  rather than metoprolol  will have twice daily dosing but superior benefit for CHF and less risk if she has a substance abuse relapse. [facility] will sign off.   The patient is ready for discharge today from a cardiac standpoint. Medication Recommendations:   Entresto  49-51 mg twice daily Carvedilol  6.25 mg twice daily Spironolactone  25 mg daily Amlodipine  10 mg daily Atorvastatin  40 mg daily Furosemide  20 mg daily Other recommendations (labs, testing, etc):  BMET at f/u appt. Low sodium diet. Ideally daily weight monitoring. Follow up as an outpatient:  will schedule f/u in 2 weeks. For questions or updates, please contact [facility] Please consult [email or web address] for contact info under         Signed, Jerel Balding, MD   "

## 2024-07-06 NOTE — TOC Transition Note (Signed)
 Transition of Care [facility]) - Discharge Note   Patient Details  Name: Julie [name] MRN: [id number] Date of Birth: May 18, 1976  [facility]) CM/SW Contact:  Rosalva Jon Bloch, RN Phone Number: 07/06/2024, 2:09 PM   Clinical Narrative:    Patient will DC to: hotel Anticipated DC date: 07/06/2024 Family notified: yes, sister Transport by: taxi  Per MD patient ready for DC today . RN, patient, and patient's sister notified of DC.   Post hospital f/u noted on AVS.  Pt to pick up Rx meds from [facility] pharmacy prior to d/c ( Match Letter provided to assist with cost). Pt states will take the bus/ transit) for provider appointments.  RNCM will sign off for now as intervention is no longer needed. Please consult us  again if new needs arise.   Final next level of care: Home/Self Care Barriers to Discharge: Continued Medical Work up   Patient Goals and CMS Choice            Discharge Placement                       Discharge Plan and Services Additional resources added to the After Visit Summary for                                       Social Drivers of Health (SDOH) Interventions SDOH Screenings   Food Insecurity: Food Insecurity Present (07/06/2024)  Housing: High Risk (07/06/2024)  Transportation Needs: Unmet Transportation Needs (07/06/2024)  Utilities: Not At Risk (06/18/2024)  Alcohol  Screen: Low Risk (03/02/2024)  Tobacco Use: High Risk (07/06/2024)     Readmission Risk Interventions    02/10/2024   11:16 AM 09/27/2023   12:14 PM  Readmission Risk Prevention Plan  Transportation Screening Complete Complete  PCP or Specialist Appt within 3-5 Days  Complete  HRI or Home Care Consult  Complete  Social Work Consult for Recovery Care Planning/Counseling  Complete  Palliative Care Screening  Not Applicable  Medication Review Oceanographer) Complete Complete  PCP or Specialist appointment within 3-5 days of discharge Complete   HRI or Home  Care Consult Complete   Palliative Care Screening Not Applicable   Skilled Nursing Facility Not Applicable

## 2024-07-06 NOTE — Telephone Encounter (Signed)
 Patient Product/process Development Scientist completed.    The patient is insured through [employer].     Ran test claim for Entresto  24-26 mg and the current 30 day co-pay is $4.00.   This test claim was processed through [facility]- copay amounts may vary at other pharmacies due to pharmacy/plan contracts, or as the patient moves through the different stages of their insurance plan.     Reyes Sharps, CPHT Pharmacy Technician Patient Advocate Specialist Lead [facility] Patient Advocate Team Direct Number: [phone]  Fax: [phone]

## 2024-07-06 NOTE — Progress Notes (Signed)
 Discharge Nurse Summary: DC order noted per MD. DC RN at bedside with patient. Patient agreeable with discharge plan, agreeable to transport to dc lounge to await TOC meds and taxi voucher. AVS printed/reviewed. PIV removed. Telemonitor not present on assessment. No DME needs. TOC meds pending pickup. CP/Edu resolved. Patient dressed and ready to go. All belongings accounted for. Patient wheeled patient downstairs to [employer].  Rosario Lund, RN

## 2024-07-06 NOTE — Progress Notes (Signed)
 AVS given and explained to patient.

## 2024-07-06 NOTE — Assessment & Plan Note (Signed)
 Completed 14 days of Augmentin . CT neck on admission shows improving tonsillitis w/ remain 1 cm of Right peritonsillar abscess. Normal WBC. No fever since admission   - Continue Augmentin  TID for the total of 14 days (07/05/24 - 07/16/24 ) - Will reach out to ENT if her symptoms become worsening and follow up 2 weeks after discharge.  - AM CBC - Pain : Tylenol  650 mg Q6H PRN Oxycodone  5 mg Q6H PRN  - Bowel Regimen : MiraLax  17g Daily , Senna 2 tab at bedtime - PT/OT consulted and follow  - Sleep : Melatonin 3 mg QHS

## 2024-07-06 NOTE — Progress Notes (Signed)
 Patient refused 4Gram Magnesium  IVPB, MD made aware. Patient stated that she just wants to be dischraged.

## 2024-07-06 NOTE — Plan of Care (Signed)
  Problem: Education: ?Goal: Knowledge of General Education information will improve ?Description: Including pain rating scale, medication(s)/side effects and non-pharmacologic comfort measures ?Outcome: Progressing ?  ?Problem: Clinical Measurements: ?Goal: Will remain free from infection ?Outcome: Progressing ?  ?Problem: Activity: ?Goal: Risk for activity intolerance will decrease ?Outcome: Progressing ?  ?Problem: Coping: ?Goal: Level of anxiety will decrease ?Outcome: Progressing ?  ?Problem: Safety: ?Goal: Ability to remain free from injury will improve ?Outcome: Progressing ?  ?

## 2024-07-06 NOTE — Progress Notes (Signed)
 MATCH MEDICATION ASSISTANCE CARD Pharmacies please call [phone] for claim processing assistance.  Rx BIN: [id number] Rx Group: [id number] Rx PCN: [id number] Relationship Code: 1 Person Code: 01  Patient ID (MRN): [id number]     Patient Name: Julie [name], Julie [name]   Patient DOB: 1976-02-03   Discharge Date: 07/06/2024  Expiration Date: 07/14/2024 (must be filled within 7 days of discharge)  Pt with limited income, jobless (panhandles). Match Letter provided to assist with med cost. Jon Hoit RN,BSN,CM

## 2024-07-06 NOTE — Progress Notes (Signed)
 Heart Failure Nurse Navigator Progress Note  PCP: Pcp, No PCP-Cardiologist: None Admission Diagnosis: Peritonsillar Abscess Admitted from: Motel  Presentation:   Julie Servello Pierceis a 49 y.o. female. She presented with sore throat, mouth  pain and dental pain worsening for the last 2 days.  Recent admission and drainage for peritonsillar abscess. History of asthma, COPD not on oxygen, diabetes, hypertension, CHF, cocaine use, tobacco use, mood disorder and recurrent tonsillar abscess. Pro-BNP 1,200. HS-Troponin-26. Chest x-ray mild cardiomegaly with mild vascular congestion and edema.  ECHO/ LVEF: 30-35%  Clinical Course:  Past Medical History:  Diagnosis Date   Asthma    Diabetes mellitus without complication (HCC)    Fracture, humerus closed, shaft 07/11/2014   left   History of bronchitis    Mental health problem    Open fracture of great toe of left foot 12/25/2015     Social History   Socioeconomic History   Marital status: Single    Spouse name: Not on file   Number of children: Not on file   Years of education: Not on file   Highest education level: Not on file  Occupational History   Not on file  Tobacco Use   Smoking status: Every Day    Current packs/day: 0.50    Average packs/day: 0.5 packs/day for 18.0 years (9.0 ttl pk-yrs)    Types: Cigarettes   Smokeless tobacco: Never   Tobacco comments:    6 cig./day  Vaping Use   Vaping status: Never Used  Substance and Sexual Activity   Alcohol  use: Yes    Comment: occasionally   Drug use: Yes    Types: Marijuana, Crack cocaine, MDMA (Ecstacy)    Comment: Crack, THC, Molly   Sexual activity: Yes    Birth control/protection: I.U.D.  Other Topics Concern   Not on file  Social History Narrative   Not on file   Social Drivers of Health   Tobacco Use: High Risk (07/06/2024)   Patient History    Smoking Tobacco Use: Every Day    Smokeless Tobacco Use: Never    Passive Exposure: Not on file  Financial  Resource Strain: Not on file  Food Insecurity: Food Insecurity Present (07/06/2024)   Epic    Worried About Programme Researcher, Broadcasting/film/video in the Last Year: Sometimes true    Barista in the Last Year: Often true  Transportation Needs: Unmet Transportation Needs (07/06/2024)   Epic    Lack of Transportation (Medical): Yes    Lack of Transportation (Non-Medical): Yes  Physical Activity: Not on file  Stress: Not on file  Social Connections: Not on file  Depression (PHQ2-9): Not on file  Alcohol  Screen: Low Risk (03/02/2024)   Alcohol  Screen    Last Alcohol  Screening Score (AUDIT): 6  Housing: High Risk (07/06/2024)   Epic    Unable to Pay for Housing in the Last Year: Yes    Number of Times Moved in the Last Year: Not on file    Homeless in the Last Year: Yes  Utilities: Not At Risk (06/18/2024)   Epic    Threatened with loss of utilities: No  Health Literacy: Not on file  Education Assessment and Provision:  Detailed education and instructions provided on heart failure disease management including the following:  Signs and symptoms of Heart Failure When to call the physician Importance of daily weights Low sodium diet Fluid restriction Medication management Anticipated future follow-up appointments  Patient education given on each of the above  topics.  Patient acknowledges understanding via teach back method and acceptance of all instructions.  Education Materials:  Living Better With Heart Failure Booklet, HF zone tool, & Daily Weight Tracker Tool.  Patient has scale at home: No.  Provide her with scale and pill box at the [facility] appointment.She was discharged prior to receiving them. Patient has pill box at home: No.   High Risk Criteria for Readmission and/or Poor Patient Outcomes: Heart failure hospital admissions (last 6 months): 3  No Show rate: 15% Difficult social situation: Patient lives in a motel. Panhandles in the day. Demonstrates medication adherence: Yes Primary  Language: English Literacy level: Reading, Writing & Comprehension  Barriers of Care:   Daily Weights Diet & Fluid Restrictions Tobacco use Drug use  Considerations/Referrals:  Referral made to Heart Failure Pharmacist Stewardship: Yes Referral made to Heart Failure CSW/NCM TOC: Yes Referral made to Heart & Vascular TOC clinic: Yes. New TOC 07/13/24 @ 11:30.  [facility]Entr[facility] off of [address]  Items for Follow-up on DC/TOC: Daily Weights Diet & Fluid Restrictions Tobacco Cessation.   Drug Use Provide her with a scale and pill box. She was discharged before receiving one.  Charmaine Pines, RN, BSN [facility] Heart Failure Navigator Secure Chat Only

## 2024-07-06 NOTE — Assessment & Plan Note (Addendum)
 A1C 11.9 on admission. BG spike yesterday but has been control after MN - Continue Q4H CBG before meal - Continue SSI resistant  - Starting glipiZIDE  10 mg daily - Hold home Metformin   - Continue Lantus  10 mg Daily  - Will hold off on SGLT2 inhibitor like Farxiga  or Jardiance considering risk for UTIs d/t A1C greater than 10

## 2024-07-06 NOTE — Assessment & Plan Note (Signed)
 QTCb 540 on admission 497 last admission back to baseline EKG at 496 yesterdays  - Continue telemetry  - Continue to monitor - Avoid medication w/ inc QT - AM BMP w/ Mg  - Supplement electrolyte maintain Mg 2< ,K 4<

## 2024-07-06 NOTE — Progress Notes (Addendum)
 "    Daily Progress Note Intern Pager: [phone]  Patient name: Julie [name] Medical record number: [id number] Date of birth: 18-Dec-1975 Age: 49 y.o. Gender: female  Primary Care Provider: Pcp, No Consultants: Cardiology  Code Status: Full Code  Pt Overview and Major Events to Date:  Julie [name] is a 49 YO Female w/ PMH of Asthma, DMII, MDD, Cocaine abuse, HTN, Smoking, GAD, and PTSD who recently admitted w/ peritonsillar abscess s/p drained by ENT in ED. She has been readmitted for worsening voice change, SOB,Tachycardia, and severe HTN which found to have worsening CHF Assessment & Plan Peritonsillar abscess Completed 14 days of Augmentin . CT neck on admission shows improving tonsillitis w/ remain 1 cm of Right peritonsillar abscess. Normal WBC. No fever since admission   - Continue Augmentin  TID for the total of 14 days (07/05/24 - 07/16/24 ) - Will reach out to ENT if her symptoms become worsening and follow up 2 weeks after discharge.  - AM CBC - Pain : Tylenol  650 mg Q6H PRN Oxycodone  5 mg Q6H PRN  - Bowel Regimen : MiraLax  17g Daily , Senna 2 tab at bedtime - PT/OT consulted and follow  - Sleep : Melatonin 3 mg QHS Acute exacerbation of CHF (congestive heart failure) (HCC) Dyspnea Primary hypertension Acute on chronic combined systolic and diastolic CHF (congestive heart failure) (HCC) Received Lasix  40 mg PO in ED once then IV Lasix  40 mg x3 last one was 07/04/24  in addition to home dose. Weight 121.9 yesterday from 122.4 kg on admission I/O : uncharted Echo on 07/04/24 shows worsening cardiac fx w. LVEF of 30- 35% compare to 50-55% last March w/ global hypokinesis and LV hypertrophy likely hypertensive cardiomyopathy.  - Cardiology consult, appreciate recommendations  - Stop Losartan  per cardiology - Continue Irbesartan  300 mg daily while inpatient  - Recommended Valsartan  320 mg daily at discharge   - Continue Metoprolol  succinate 50 mg daily d/t hx of Asthma   - Continue  Spironolactone  25 mg daily   - Continue amlodipine  10 mg daily - HF consult, appreciate recomendations - Continue Lasix  20 mg PO daily - Continue Norvasc  10 mg daily  - Continue Atorvastatin  40 mg Daily for HLD - Continue albuterol  neb Q6H PRN  - Daily weight - Strict I&O  - Continue Pulse sat - PT/OT consult for eval  T2DM (type 2 diabetes mellitus) (HCC) A1C 11.9 on admission. BG spike yesterday but has been control after MN - Continue Q4H CBG before meal - Continue SSI resistant  - Starting glipiZIDE  10 mg daily - Hold home Metformin   - Continue Lantus  10 mg Daily  - Will hold off on SGLT2 inhibitor like Farxiga  or Jardiance considering risk for UTIs d/t A1C greater than 10 Prolonged Q-T interval on ECG QTCb 540 on admission 497 last admission back to baseline EKG at 496 yesterdays  - Continue telemetry  - Continue to monitor - Avoid medication w/ inc QT - AM BMP w/ Mg  - Supplement electrolyte maintain Mg 2< ,K 4<  Chronic health problem MDD - continue sertraline  50 mg daily, ResperiDONE 0.5 mg QHS Asthma - continue home albuterol , anoro ellipta  PRN  FEN/GI: Carb modified PPx: Lovenox  Dispo:Pending discharge on clinical improvement   Subjective:  HA OVN w/ improved w/ Tylenol . Doing well this AM  Objective: Temp:  [97.6 F (36.4 C)-98.6 F (37 C)] 98.6 F (37 C) (01/16 0811) Pulse Rate:  [71-83] 79 (01/16 0811) Resp:  [16-18] 16 (01/16 0811) BP: (112-164)/(64-115)  134/74 (01/16 0811) SpO2:  [96 %-99 %] 99 % (01/16 0811)  Physical Exam Constitutional:      Appearance: She is obese.  Cardiovascular:     Rate and Rhythm: Normal rate.  Pulmonary:     Effort: Pulmonary effort is normal.  Neurological:     Mental Status: She is alert and oriented to person, place, and time.      Laboratory: Most recent CBC Lab Results  Component Value Date   WBC 4.6 07/05/2024   HGB 14.2 07/05/2024   HCT 45.1 07/05/2024   MCV 85.6 07/05/2024   PLT 221 07/05/2024    Most recent BMP    Latest Ref Rng & Units 07/06/2024    4:50 AM  BMP  Glucose 70 - 99 mg/dL 852   BUN 6 - 20 mg/dL 15   Creatinine 9.55 - 1.00 mg/dL 9.26   Sodium 864 - 854 mmol/L 140   Potassium 3.5 - 5.1 mmol/L 3.9   Chloride 98 - 111 mmol/L 108   CO2 22 - 32 mmol/L 24   Calcium  8.9 - 10.3 mg/dL 89.9      Julie [name] B, DO 07/06/2024, 9:21 AM  PGY-1, [facility] FPTS Intern pager: [phone], text pages welcome Secure chat group [facility] Teaching Service   "

## 2024-07-06 NOTE — Assessment & Plan Note (Signed)
 MDD - continue sertraline  50 mg daily, ResperiDONE 0.5 mg QHS Asthma - continue home albuterol , anoro ellipta  PRN

## 2024-07-06 NOTE — Progress Notes (Signed)
 "  Heart Failure Stewardship Pharmacist Progress Note   PCP: Pcp, No PCP-Cardiologist: None    HPI:  49 year old female with a PMH significant for asthma, T2DM, and recent hospitalization for peritonsillar abscess who presented with worsening right jaw swelling and pain over the past few days. She stated that she did have a fever at home. She also endorsed SOB that had worsened the day of admission, occasional cough and bilateral chest pain that moves from one side to the other. She also endorsed leg swelling, which appeared to resolve after a dose of oral furosemide  40 mg on admission. After previous discharge, she was instructed to follow-up with ENT, but she did not as she thought she was feeling better. CT neck showed improved appearance of tonsillitis and a R peritonsillar abscess with a 1 cm collection remaining. ProBNP was 1,200 and troponin was 26 on admission. CXR showed mild cardiomegaly with mild vascular congestion and edema, but was negative for pleural effusion or pneumothorax. ECHO on 1/14 revealed an EF of 30-35%.  Patient is doing well today, other than feeling irritated about factors outside of her hospitalization. She denies SOB, chest pain or orthopnea. No lower extremity edema observed on physical exam. I educated patient on importance of taking medications as prescribed. Patient stated that she is willing to take BID medications, but prefers to not take TID or QID medications. She also reports cigarette smoking, stating she used to smoke ~10 cigarettes/day but cut down to 3 cigarettes/day due to tonsillar abscess. I educated and offered smoking cessation therapy, but patient refused. She expressed concern about affordability of copays. I informed her that her copays may be eligible to be waived since she has Medicaid. Since she has a history of cocaine use, I spoke with the cardiologist to transition her metoprolol  succinate to carvedilol . Also recommended Entresto  for better BP  lowering since she has tolerated irbesartan  300 mg while inpatient, and the copay would be $4.00.  Current HF Medications: Diuretic: furosemide  20 mg daily Beta Blocker: metoprolol  succinate 50 mg daily ACE/ARB/ARNI: irbesartan  300 mg daily  MRA: spironolactone  25 mg daily  Prior to admission HF Medications: Diuretic: furosemide  20 mg daily ACE/ARB/ARNI: losartan  50 mg daily  Pertinent Lab Values: Serum creatinine 0.73, BUN 15, Potassium 3.9 (on 40 mEq BID), Sodium 140, proBNP 1,200, Magnesium  1.7 (repleted with 4 g x1), A1c 11.9%  Vital Signs: Weight: 268 lbs - bed weight (admission weight: 269 lbs) Blood pressure: 110s-150s/70s-90s  Heart rate: 70s-100s  I/O: net not charted yesterday; net -0.17  L since admission  Medication Assistance / Insurance Benefits Check: Does the patient have prescription insurance?  Yes Type of insurance plan: [employer] + Medicaid  Outpatient Pharmacy:  Prior to admission outpatient pharmacy: [facility] pharmacy Is the patient willing to use [facility] TOC pharmacy at discharge? Yes Is the patient willing to transition their outpatient pharmacy to utilize a [facility] outpatient pharmacy?   Yes    Assessment: 1. Acute on chronic systolic CHF (LVEF 30-35%), due to unknown etiology. NYHA class II symptoms. - A1c 11.9% > would hold off on SGLT2i - UDS negative for cocaine this admission, but has been (+) dating back to June 2018 (last + on 03/01/24)  - BP has been consistently elevated on irbesartan  300 mg daily  Plan: 1) Medication changes recommended at this time: - Switch metoprolol  succinate 50 mg daily to carvedilol  6.25 mg BID - Consider initiating Entresto  49/51 mg BID and discontinuing losartan  50 mg  at discharge  2) Patient assistance: -  Brand Entresto  copay - $4 - Eligible for waived copays through [facility] pharmacies  3)  Education  - Patient has been educated on current HF medications and potential additions to HF medication regimen - Patient  verbalizes understanding that over the next few months, these medication doses may change and more medications may be added to optimize HF regimen - Patient has been educated on basic disease state pathophysiology and goals of therapy   B. Maegan Maryse Brierley, PharmD PGY-1 Pharmacy Resident [facility] 07/06/2024 8:00 AM    "

## 2024-07-10 ENCOUNTER — Ambulatory Visit: Payer: Self-pay | Admitting: Family Medicine

## 2024-07-11 ENCOUNTER — Ambulatory Visit: Admitting: Family Medicine

## 2024-07-12 ENCOUNTER — Telehealth (HOSPITAL_COMMUNITY): Payer: Self-pay

## 2024-07-12 ENCOUNTER — Ambulatory Visit: Admitting: Family Medicine

## 2024-07-12 NOTE — Telephone Encounter (Signed)
 Called to confirm/remind patient of their appointment at the [facility] on 1975-07-29 11:30.   Appointment:   [x] Confirmed  [] Left mess   [] No answer/No voice mail  [] VM Full/unable to leave message  [] Phone not in service  Patient reminded to bring all medications and/or complete list.  Confirmed patient has transportation. Gave directions, instructed to utilize valet parking.

## 2024-07-13 ENCOUNTER — Ambulatory Visit (HOSPITAL_COMMUNITY)

## 2024-07-13 NOTE — Progress Notes (Incomplete)
 "    HEART & VASCULAR TRANSITION OF CARE CONSULT NOTE     Referring Physician: Pcp, No   Chief Complaint:   HPI: Referred to clinic by *** for heart failure consultation.   Julie [name] is a 49 y.o. female w/ h/o asthma, HTN, h/o substance use (previous admits cocaine +) and poor compliance, recently admitted w/ peritonsillar abscess and new systolic heart failure, hypertensive urgency, initial BP 224/127, pro BNP 1200. CXR cardiomegaly but no frank edema. UDS negative.  Treated w/ antihypertensive meds and IV Lasix . Echo EF 30-35%, RV normal. Cardiology consulted, and felt most likely to be hypertensive CM.    Cardiac Testing    Past Medical History:  Diagnosis Date   Asthma    Diabetes mellitus without complication (HCC)    Fracture, humerus closed, shaft 07/11/2014   left   History of bronchitis    Mental health problem    Open fracture of great toe of left foot 12/25/2015    Current Outpatient Medications  Medication Sig Dispense Refill   acetaminophen  (TYLENOL ) 500 MG tablet Take 2 tablets (1,000 mg total) by mouth every 6 (six) hours as needed for mild pain (pain score 1-3). 30 tablet 0   albuterol  (VENTOLIN  HFA) 108 (90 Base) MCG/ACT inhaler Inhale 1-2 puffs into the lungs every 6 (six) hours as needed for wheezing or shortness of breath. 18 g 0   amLODipine  (NORVASC ) 10 MG tablet Take 1 tablet (10 mg total) by mouth daily. 30 tablet 0   amoxicillin -clavulanate (AUGMENTIN ) 875-125 MG tablet Take 1 tablet by mouth 3 (three) times daily for 10 days. 30 tablet 0   atorvastatin  (LIPITOR) 40 MG tablet Take 1 tablet (40 mg total) by mouth daily. 30 tablet 0   carvedilol  (COREG ) 6.25 MG tablet Take 1 tablet (6.25 mg total) by mouth 2 (two) times daily with a meal. 60 tablet 1   furosemide  (LASIX ) 20 MG tablet Take 1 tablet (20 mg total) by mouth daily. 30 tablet 0   glipiZIDE  (GLUCOTROL ) 10 MG tablet Take 1 tablet (10 mg total) by mouth daily before breakfast. 30 tablet 0    metFORMIN  (GLUCOPHAGE ) 500 MG tablet Take 1 tablet (500 mg total) by mouth 2 (two) times daily with a meal. 60 tablet 0   naproxen  sodium (ALEVE ) 220 MG tablet Take 220 mg by mouth daily as needed (pain).     risperiDONE  (RISPERDAL ) 0.5 MG tablet Take 1 tablet (0.5 mg total) by mouth at bedtime. 30 tablet 0   sacubitril -valsartan  (ENTRESTO ) 49-51 MG Take 1 tablet by mouth 2 (two) times daily. 60 tablet 1   sertraline  (ZOLOFT ) 50 MG tablet Take 1 tablet (50 mg total) by mouth daily. 30 tablet 0   spironolactone  (ALDACTONE ) 25 MG tablet Take 1 tablet (25 mg total) by mouth daily. 30 tablet 0   umeclidinium-vilanterol (ANORO ELLIPTA ) 62.5-25 MCG/ACT AEPB Inhale 1 puff into the lungs daily. 60 each 0   No current facility-administered medications for this visit.    Allergies[1]    Social History   Socioeconomic History   Marital status: Single    Spouse name: Not on file   Number of children: Not on file   Years of education: Not on file   Highest education level: Not on file  Occupational History   Not on file  Tobacco Use   Smoking status: Every Day    Current packs/day: 0.50    Average packs/day: 0.5 packs/day for 18.0 years (9.0 ttl pk-yrs)  Types: Cigarettes   Smokeless tobacco: Never   Tobacco comments:    6 cig./day  Vaping Use   Vaping status: Never Used  Substance and Sexual Activity   Alcohol  use: Yes    Comment: occasionally   Drug use: Yes    Types: Marijuana, Crack cocaine, MDMA (Ecstacy)    Comment: Crack, THC, Molly   Sexual activity: Yes    Birth control/protection: I.U.D.  Other Topics Concern   Not on file  Social History Narrative   Not on file   Social Drivers of Health   Tobacco Use: High Risk (07/06/2024)   Patient History    Smoking Tobacco Use: Every Day    Smokeless Tobacco Use: Never    Passive Exposure: Not on file  Financial Resource Strain: Not on file  Food Insecurity: Food Insecurity Present (07/06/2024)   Epic    Worried About  Programme Researcher, Broadcasting/film/video in the Last Year: Sometimes true    Barista in the Last Year: Often true  Transportation Needs: Unmet Transportation Needs (07/06/2024)   Epic    Lack of Transportation (Medical): Yes    Lack of Transportation (Non-Medical): Yes  Physical Activity: Not on file  Stress: Not on file  Social Connections: Not on file  Intimate Partner Violence: Not At Risk (06/18/2024)   Epic    Fear of Current or Ex-Partner: No    Emotionally Abused: No    Physically Abused: No    Sexually Abused: No  Depression (PHQ2-9): Not on file  Alcohol  Screen: Low Risk (03/02/2024)   Alcohol  Screen    Last Alcohol  Screening Score (AUDIT): 6  Housing: High Risk (07/06/2024)   Epic    Unable to Pay for Housing in the Last Year: Yes    Number of Times Moved in the Last Year: Not on file    Homeless in the Last Year: Yes  Utilities: Not At Risk (06/18/2024)   Epic    Threatened with loss of utilities: No  Health Literacy: Not on file     No family history on file.  There were no vitals filed for this visit.  PHYSICAL EXAM: General:  Well appearing. No respiratory difficulty HEENT: normal Neck: supple. no JVD. Carotids 2+ bilat; no bruits. No lymphadenopathy or thryomegaly appreciated. Cor: PMI nondisplaced. Regular rate & rhythm. No rubs, gallops or murmurs. Lungs: clear Abdomen: soft, nontender, nondistended. No hepatosplenomegaly. No bruits or masses. Good bowel sounds. Extremities: no cyanosis, clubbing, rash, edema Neuro: alert & oriented x 3, cranial nerves grossly intact. moves all 4 extremities w/o difficulty. Affect pleasant.  ECG:   ASSESSMENT & PLAN:  NYHA *** GDMT  Diuretic- BB- Ace/ARB/ARNI MRA SGLT2i    Referred to HFSW (PCP, Medications, Transportation, ETOH Abuse, Drug Abuse, Insurance, Financial ): Yes or No Refer to Pharmacy: Yes or No Refer to Home Health: Yes on No Refer to Advanced Heart Failure Clinic: Yes or no  Refer to General Cardiology:  Yes or No  Follow up      [1] Not on File  "

## 2024-07-19 ENCOUNTER — Inpatient Hospital Stay: Payer: Self-pay
